# Patient Record
Sex: Female | Born: 1950 | Race: White | Hispanic: No | Marital: Married | Smoking: Never smoker
Health system: Southern US, Community
[De-identification: ages and names within clinical notes are randomized; demographics above are authoritative.]

## PROBLEM LIST (undated history)

## (undated) DIAGNOSIS — K219 Gastro-esophageal reflux disease without esophagitis: Secondary | ICD-10-CM

## (undated) DIAGNOSIS — R011 Cardiac murmur, unspecified: Secondary | ICD-10-CM

## (undated) DIAGNOSIS — M810 Age-related osteoporosis without current pathological fracture: Secondary | ICD-10-CM

## (undated) DIAGNOSIS — I10 Essential (primary) hypertension: Secondary | ICD-10-CM

## (undated) DIAGNOSIS — G4730 Sleep apnea, unspecified: Secondary | ICD-10-CM

## (undated) DIAGNOSIS — A64 Unspecified sexually transmitted disease: Secondary | ICD-10-CM

## (undated) DIAGNOSIS — M8580 Other specified disorders of bone density and structure, unspecified site: Secondary | ICD-10-CM

## (undated) DIAGNOSIS — K449 Diaphragmatic hernia without obstruction or gangrene: Secondary | ICD-10-CM

## (undated) DIAGNOSIS — I499 Cardiac arrhythmia, unspecified: Secondary | ICD-10-CM

## (undated) DIAGNOSIS — F329 Major depressive disorder, single episode, unspecified: Secondary | ICD-10-CM

## (undated) DIAGNOSIS — F32A Depression, unspecified: Secondary | ICD-10-CM

## (undated) DIAGNOSIS — M1990 Unspecified osteoarthritis, unspecified site: Secondary | ICD-10-CM

## (undated) DIAGNOSIS — E7800 Pure hypercholesterolemia, unspecified: Secondary | ICD-10-CM

## (undated) DIAGNOSIS — D649 Anemia, unspecified: Secondary | ICD-10-CM

## (undated) DIAGNOSIS — H00019 Hordeolum externum unspecified eye, unspecified eyelid: Secondary | ICD-10-CM

## (undated) HISTORY — PX: REDUCTION MAMMAPLASTY: SUR839

## (undated) HISTORY — DX: Unspecified sexually transmitted disease: A64

## (undated) HISTORY — DX: Major depressive disorder, single episode, unspecified: F32.9

## (undated) HISTORY — DX: Other specified disorders of bone density and structure, unspecified site: M85.80

## (undated) HISTORY — PX: FRACTURE SURGERY: SHX138

## (undated) HISTORY — DX: Depression, unspecified: F32.A

## (undated) HISTORY — PX: OTHER SURGICAL HISTORY: SHX169

## (undated) HISTORY — DX: Diaphragmatic hernia without obstruction or gangrene: K44.9

## (undated) HISTORY — DX: Age-related osteoporosis without current pathological fracture: M81.0

## (undated) HISTORY — DX: Hordeolum externum unspecified eye, unspecified eyelid: H00.019

## (undated) HISTORY — PX: BREAST REDUCTION SURGERY: SHX8

## (undated) HISTORY — PX: NASAL SEPTUM SURGERY: SHX37

---

## 1998-02-06 ENCOUNTER — Emergency Department (HOSPITAL_COMMUNITY): Admission: EM | Admit: 1998-02-06 | Discharge: 1998-02-06 | Payer: Self-pay | Admitting: Emergency Medicine

## 1998-08-24 ENCOUNTER — Ambulatory Visit (HOSPITAL_COMMUNITY): Admission: RE | Admit: 1998-08-24 | Discharge: 1998-08-24 | Payer: Self-pay | Admitting: Family Medicine

## 1998-10-06 ENCOUNTER — Ambulatory Visit (HOSPITAL_COMMUNITY): Admission: RE | Admit: 1998-10-06 | Discharge: 1998-10-06 | Payer: Self-pay | Admitting: Family Medicine

## 1998-11-18 ENCOUNTER — Other Ambulatory Visit: Admission: RE | Admit: 1998-11-18 | Discharge: 1998-11-18 | Payer: Self-pay | Admitting: Obstetrics and Gynecology

## 1999-11-18 ENCOUNTER — Other Ambulatory Visit: Admission: RE | Admit: 1999-11-18 | Discharge: 1999-11-18 | Payer: Self-pay | Admitting: Obstetrics and Gynecology

## 2001-02-04 ENCOUNTER — Other Ambulatory Visit: Admission: RE | Admit: 2001-02-04 | Discharge: 2001-02-04 | Payer: Self-pay | Admitting: Obstetrics and Gynecology

## 2001-02-05 ENCOUNTER — Encounter (INDEPENDENT_AMBULATORY_CARE_PROVIDER_SITE_OTHER): Payer: Self-pay | Admitting: Specialist

## 2001-02-05 ENCOUNTER — Other Ambulatory Visit: Admission: RE | Admit: 2001-02-05 | Discharge: 2001-02-05 | Payer: Self-pay | Admitting: Obstetrics and Gynecology

## 2001-03-04 ENCOUNTER — Ambulatory Visit (HOSPITAL_BASED_OUTPATIENT_CLINIC_OR_DEPARTMENT_OTHER): Admission: RE | Admit: 2001-03-04 | Discharge: 2001-03-04 | Payer: Self-pay | Admitting: Internal Medicine

## 2002-02-20 ENCOUNTER — Other Ambulatory Visit: Admission: RE | Admit: 2002-02-20 | Discharge: 2002-02-20 | Payer: Self-pay | Admitting: Obstetrics and Gynecology

## 2003-02-23 ENCOUNTER — Other Ambulatory Visit: Admission: RE | Admit: 2003-02-23 | Discharge: 2003-02-23 | Payer: Self-pay | Admitting: Obstetrics and Gynecology

## 2004-04-19 ENCOUNTER — Other Ambulatory Visit: Admission: RE | Admit: 2004-04-19 | Discharge: 2004-04-19 | Payer: Self-pay | Admitting: Obstetrics and Gynecology

## 2004-06-10 ENCOUNTER — Ambulatory Visit (HOSPITAL_COMMUNITY): Admission: RE | Admit: 2004-06-10 | Discharge: 2004-06-10 | Payer: Self-pay | Admitting: Family Medicine

## 2004-06-15 ENCOUNTER — Encounter: Admission: RE | Admit: 2004-06-15 | Discharge: 2004-06-15 | Payer: Self-pay | Admitting: Family Medicine

## 2004-09-29 ENCOUNTER — Ambulatory Visit: Payer: Self-pay | Admitting: Internal Medicine

## 2004-10-06 ENCOUNTER — Ambulatory Visit: Payer: Self-pay | Admitting: Internal Medicine

## 2004-10-14 ENCOUNTER — Ambulatory Visit (HOSPITAL_COMMUNITY): Admission: RE | Admit: 2004-10-14 | Discharge: 2004-10-14 | Payer: Self-pay | Admitting: Gastroenterology

## 2004-10-14 ENCOUNTER — Encounter (INDEPENDENT_AMBULATORY_CARE_PROVIDER_SITE_OTHER): Payer: Self-pay | Admitting: *Deleted

## 2005-04-20 ENCOUNTER — Other Ambulatory Visit: Admission: RE | Admit: 2005-04-20 | Discharge: 2005-04-20 | Payer: Self-pay | Admitting: *Deleted

## 2005-05-16 ENCOUNTER — Ambulatory Visit: Payer: Self-pay | Admitting: Internal Medicine

## 2005-07-18 ENCOUNTER — Ambulatory Visit: Payer: Self-pay | Admitting: Internal Medicine

## 2005-08-17 ENCOUNTER — Ambulatory Visit: Payer: Self-pay | Admitting: Internal Medicine

## 2005-11-03 ENCOUNTER — Encounter: Admission: RE | Admit: 2005-11-03 | Discharge: 2005-11-03 | Payer: Self-pay | Admitting: Orthopaedic Surgery

## 2005-12-27 ENCOUNTER — Encounter: Admission: RE | Admit: 2005-12-27 | Discharge: 2005-12-27 | Payer: Self-pay | Admitting: Orthopedic Surgery

## 2006-02-07 ENCOUNTER — Inpatient Hospital Stay (HOSPITAL_COMMUNITY): Admission: RE | Admit: 2006-02-07 | Discharge: 2006-02-11 | Payer: Self-pay | Admitting: Orthopedic Surgery

## 2006-05-01 ENCOUNTER — Other Ambulatory Visit: Admission: RE | Admit: 2006-05-01 | Discharge: 2006-05-01 | Payer: Self-pay | Admitting: Obstetrics & Gynecology

## 2006-05-21 ENCOUNTER — Inpatient Hospital Stay (HOSPITAL_COMMUNITY): Admission: RE | Admit: 2006-05-21 | Discharge: 2006-05-24 | Payer: Self-pay | Admitting: Orthopedic Surgery

## 2006-08-31 ENCOUNTER — Ambulatory Visit: Payer: Self-pay | Admitting: Internal Medicine

## 2007-02-14 ENCOUNTER — Ambulatory Visit: Payer: Self-pay | Admitting: Internal Medicine

## 2007-05-09 ENCOUNTER — Other Ambulatory Visit: Admission: RE | Admit: 2007-05-09 | Discharge: 2007-05-09 | Payer: Self-pay | Admitting: Obstetrics & Gynecology

## 2007-06-14 DIAGNOSIS — J4540 Moderate persistent asthma, uncomplicated: Secondary | ICD-10-CM | POA: Insufficient documentation

## 2007-06-14 DIAGNOSIS — J3089 Other allergic rhinitis: Secondary | ICD-10-CM

## 2007-06-14 DIAGNOSIS — J302 Other seasonal allergic rhinitis: Secondary | ICD-10-CM | POA: Insufficient documentation

## 2007-06-14 DIAGNOSIS — G4733 Obstructive sleep apnea (adult) (pediatric): Secondary | ICD-10-CM | POA: Insufficient documentation

## 2007-06-14 DIAGNOSIS — J4530 Mild persistent asthma, uncomplicated: Secondary | ICD-10-CM | POA: Insufficient documentation

## 2007-06-14 DIAGNOSIS — K219 Gastro-esophageal reflux disease without esophagitis: Secondary | ICD-10-CM | POA: Insufficient documentation

## 2007-06-14 DIAGNOSIS — I10 Essential (primary) hypertension: Secondary | ICD-10-CM | POA: Insufficient documentation

## 2007-08-19 ENCOUNTER — Ambulatory Visit: Payer: Self-pay | Admitting: Internal Medicine

## 2008-05-13 ENCOUNTER — Other Ambulatory Visit: Admission: RE | Admit: 2008-05-13 | Discharge: 2008-05-13 | Payer: Self-pay | Admitting: Obstetrics & Gynecology

## 2008-07-17 ENCOUNTER — Ambulatory Visit: Payer: Self-pay | Admitting: Internal Medicine

## 2008-11-25 ENCOUNTER — Ambulatory Visit: Payer: Self-pay | Admitting: Internal Medicine

## 2008-11-25 DIAGNOSIS — J329 Chronic sinusitis, unspecified: Secondary | ICD-10-CM | POA: Insufficient documentation

## 2009-06-04 DIAGNOSIS — H00019 Hordeolum externum unspecified eye, unspecified eyelid: Secondary | ICD-10-CM

## 2009-06-04 HISTORY — DX: Hordeolum externum unspecified eye, unspecified eyelid: H00.019

## 2010-01-27 ENCOUNTER — Ambulatory Visit: Payer: Self-pay | Admitting: Internal Medicine

## 2010-02-28 ENCOUNTER — Telehealth (INDEPENDENT_AMBULATORY_CARE_PROVIDER_SITE_OTHER): Payer: Self-pay | Admitting: *Deleted

## 2010-03-17 ENCOUNTER — Telehealth (INDEPENDENT_AMBULATORY_CARE_PROVIDER_SITE_OTHER): Payer: Self-pay | Admitting: *Deleted

## 2010-04-09 ENCOUNTER — Emergency Department (HOSPITAL_COMMUNITY): Admission: EM | Admit: 2010-04-09 | Discharge: 2010-04-09 | Payer: Self-pay | Admitting: Emergency Medicine

## 2010-04-24 ENCOUNTER — Ambulatory Visit (HOSPITAL_COMMUNITY): Admission: EM | Admit: 2010-04-24 | Discharge: 2010-04-24 | Payer: Self-pay | Admitting: Emergency Medicine

## 2010-05-30 ENCOUNTER — Ambulatory Visit: Payer: Self-pay | Admitting: Internal Medicine

## 2010-09-13 ENCOUNTER — Telehealth (INDEPENDENT_AMBULATORY_CARE_PROVIDER_SITE_OTHER): Payer: Self-pay | Admitting: *Deleted

## 2010-09-14 ENCOUNTER — Telehealth (INDEPENDENT_AMBULATORY_CARE_PROVIDER_SITE_OTHER): Payer: Self-pay | Admitting: *Deleted

## 2010-09-14 ENCOUNTER — Ambulatory Visit
Admission: RE | Admit: 2010-09-14 | Discharge: 2010-09-14 | Payer: Self-pay | Source: Home / Self Care | Attending: Internal Medicine | Admitting: Internal Medicine

## 2010-10-04 NOTE — Assessment & Plan Note (Signed)
Summary: rov/apc   Primary Provider/Referring Provider:  Selena Batten  CC:  Follow up visit-asthma.  History of Present Illness: OBSTRUCTIVE SLEEP APNEA (ICD-327.23) HYPERTENSION (ICD-401.9) GERD (ICD-530.81) ASTHMA (ICD-493.90)  From 08/19/07-     This is a 60 year old woman who presents for follow-up Pulmonary visit.  The patient comes in today for follow-up of asthma, allergic rhinitis. Has been stressed with mother's illness.  Sinusitis in October- took Augmentin 7 days-> yeast infection. Then took 10 more days with recurrence Febrile then for a while..Then z-pak. Two days off antibiotic sinusitis flares again. On Augmentin again now. Pressure discomfort left ear .Prior deviated septum repair by Dr. Haroldine Laws 1985. Chest ok.No longer febrile, hormone sweats, pressure but not real headache. Got flu vax.  07/17/08- Asthma, allergic rhinits 1 yr f/u. Discussed Flu season and vaccines. Well controlled. Rare need rescue inhaler, mostly before exercise. Neti pot helps rhinitis.Denies wheeze, sleep distrubance by breathing, chest pain or palpitation.  11/25/08- asthma, allergic rhinitis, sinusitis She feels she has a sinus infection with 3-4 days of head congestion, Mucus is discolored. She started herself on a sulfa drug script left over- now day 3. Augmentin works better. No distinct headache, fever, ear ache, sore throat or cough yet.  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 She took an antibioitic but didn't think it lasted long enough. Sinus congestion- using Neti pot. Keeps some chest tightness much of the time, but with current illness is using her rescue inhaler more and not seeming to help. Taking magnesium for her breathing- discussed.    Asthma History    Asthma Control Assessment:    Age range: 12+ years    Symptoms: >2 days/week    Nighttime Awakenings:  0-2/month    Interferes w/ normal activity: no limitations    SABA use (not for EIB): >2 days/week    Asthma Control Assessment: Not Well Controlled   Preventive Screening-Counseling & Management  Alcohol-Tobacco     Smoking Status: never  Current Medications (verified): 1)  Advair Diskus 100-50 Mcg/dose  Misc (Fluticasone-Salmeterol) .... Inhale 1 Puff Two Times A Day 2)  Singulair 10 Mg  Tabs (Montelukast Sodium) .... Take 1 Tablet By Mouth Once A Day 3)  Nasonex 50 Mcg/act  Susp (Mometasone Furoate) .... Take 2 Sprays in Each Nostril Once A Day 4)  Prometrium 200 Mg  Caps (Progesterone Micronized) 5)  Zyrtec Allergy 10 Mg  Tabs (Cetirizine Hcl) .... Take 1 Tablet By Mouth Once A Day 6)  Proair Hfa 108 (90 Base) Mcg/act  Aers (Albuterol Sulfate) .... 2 Puffs 4 Times Daily If Needed 7)  Lunesta 1 Mg  Tabs (Eszopiclone) .... Take As Needed 8)  Vivelle-Dot 0.0375 Mg/24hr  Pttw (Estradiol) 9)  Norvasc 5 Mg  Tabs (Amlodipine Besylate) 10)  Lipitor 80 Mg Tabs (Atorvastatin Calcium) .Marland Kitchen.. 1 Tablet Once Daily 11)  Nexium 40 Mg  Cpdr (Esomeprazole Magnesium) .... Take 1 By Mouth Once Daily 12)  Calcium 500 500 Mg  Tabs (Calcium Carbonate) .... Take 1 By Mouth Once Daily 13)  Vitamin D 400 Unit  Caps (Cholecalciferol) .... Take Once Daily As Directed 14)  Augmentin 875-125 Mg Tabs (Amoxicillin-Pot Clavulanate) .Marland Kitchen.. 1 Twice Daily  Allergies (verified): No Known Drug Allergies  Past History:  Past Medical History: Last updated: 08/19/2007 Asthma GERD Hypertension Allergic rhinitis Remote obstructive sleep apnea- no longer uses mouth piece Bilateral Hip replacements Anemia, iron deficiency  Family History: Last updated: 09/08/2008 Father Heart Disease Mother Arthritis Grandmother Pancreatic Cancer  Mother- deceased age 37; heart and kidney failure Father- deceased age 52; cancer and heart failure Sibling 1- living age 26 Sibling 2- living age 60; Arthritis, allergues, asthma,  reflux  Social History: Last updated: 09/08/2008 Married with 1 child Retired Runner, 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  Positive history of passive tobacco smoke exposure.  Exercises- 3-6 times weekly Caffeine- 1 cup daily   Risk Factors: Smoking Status: never (01/27/2010) Passive Smoke Exposure: yes (09/08/2008)  Past Surgical History: Bilateral hip replacement Nasal septoplasty C-section Toe surgery Knee arthroscopy  Review of Systems      See HPI       The patient complains of shortness of breath with activity, non-productive cough, and nasal congestion/difficulty breathing through nose.  The patient denies shortness of breath at rest, productive cough, coughing up blood, chest pain, irregular heartbeats, acid heartburn, indigestion, loss of appetite, weight change, abdominal pain, difficulty swallowing, sore throat, tooth/dental problems, headaches, and sneezing.    Vital Signs:  Patient profile:   60 year old female Height:      65 inches Weight:      189 pounds BMI:     31.56 O2 Sat:      91 % on Room air Pulse rate:   71 / minute BP sitting:   110 / 66  (left arm) Cuff size:   regular  Vitals Entered By: Reynaldo Minium CMA (Jan 27, 2010 2:40 PM)  O2 Flow:  Room air  Physical Exam  Additional Exam:  General: A/Ox3; pleasant and cooperative, NAD, SKIN: no rash, lesions NODES: no lymphadenopathy HEENT: [place]/AT, EOM- WNL, Conjuctivae- clear, PERRLA, TM-WNL, Nose-stuffy, red., Throat- red without exudate. 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 Caught thid from husband. i don't suspect immune  incompetance. Has had aumentin 3 x in past year. She would like a script to hold, but we agreed to give something different for rotation now.  Problem # 2:  OBSTRUCTIVE SLEEP APNEA (ICD-327.23) Assessment: Unchanged Noted with goal of weight loss.  Problem # 3:  ASTHMA (ICD-493.90) asthmatic bronchitis. Asthma control is uncertain and we will look at this later. Will give neb and depo today as we deal with the acute component. We will plan PFT to reassess her baseline.  Medications Added to Medication List This Visit: 1)  Clarithromycin 500 Mg Tabs (Clarithromycin) .Marland Kitchen.. 1 twice daily after meals  Other Orders: Depo- Medrol 80mg  (J1040) Admin of Therapeutic Inj  intramuscular or subcutaneous (47425) Xopenex 1.25mg  (Z5638)  Patient Instructions: 1)  Please schedule a follow-up appointment in 4 months. 2)  Schedule PFT 3)  Script to hold for augmentin 4)  script now for generic biaxin 5)  neb xop 1.25 6)  depo 80 Prescriptions: AUGMENTIN 875-125 MG TABS (AMOXICILLIN-POT CLAVULANATE) 1 twice daily  #20 x 1   Entered and Authorized by:   [name] MD   Signed by:   [name] MD on 01/27/2010   Method used:   Print then Give to Patient   RxID:   [id number] CLARITHROMYCIN 500 MG TABS (CLARITHROMYCIN) 1 twice  daily after meals  #20 x 0   Entered and Authorized by:   [name] MD   Signed by:   [name] MD on 01/27/2010   Method used:   Print then Give to Patient   RxID:   [id number]      Medication Administration  Injection # 1:    Medication: Depo- Medrol 80mg     Diagnosis: ASTHMA (ICD-493.90)    Route: IM    Site: L deltoid    Exp Date: 07/05/2012    Lot #: [id number]    Mfr: Pharmacia    Patient tolerated injection without complications    Given by: Denna Haggard, CMA (Jan 27, 2010 3:35 PM)  Medication # 1:    Medication: Xopenex 1.25mg     Diagnosis: ASTHMA (ICD-493.90)    Dose: 1    Route: po    Exp Date: 05/05/2010    Lot  #: [id number]    Mfr: [email or web address]    Patient tolerated medication without complications    Given by: Denna Haggard, CMA (Jan 27, 2010 3:36 PM)  Orders Added: 1)  Depo- Medrol 80mg  [J1040] 2)  Admin of Therapeutic Inj  intramuscular or subcutaneous [96372] 3)  Xopenex 1.25mg  [K7425]

## 2010-10-04 NOTE — Assessment & Plan Note (Signed)
Summary: rov w/pft/apc   Primary Provider/Referring Provider:  Selena Batten  CC:  Follow up visit-Review PFT.Marland Kitchen  History of Present Illness: 11/25/08- asthma, allergic rhinitis, sinusitis She feels she has a sinus infection with 3-4 days of head congestion, Mucus is discolored. She started herself on a sulfa drug script left over- now day 3. Augmentin works better. No distinct headache, fever, ear ache, sore throat or cough yet.  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 She took an antibioitic but didn't think it lasted long enough. Sinus congestion- using Neti pot. Keeps some chest tightness much of the time, but with current illness is using her rescue inhaler more and not seeming to help. Taking magnesium for her breathing- discussed.  May 30, 2010- Asthma, allergic rhinitis, sinusitis  She just finished augmentin for a bronchitis and is just improving now. Did have fever and discolored sputum, now clearing. Did use Neti pot. Needs med refills. Had been bradycardic while on nadalol given for PVCs, and no longer feeling the chest tightness- in retrospect related to the B-blocker. PFT- mild obstruction, insignif response to dilator. FEV1 2.56/ 91%, R 0.73, 25-75% 0.65.   Asthma History    Initial Asthma Severity Rating:    Age range: 12+ years    Symptoms: 0-2 days/week    Nighttime Awakenings: 0-2/month    Interferes w/ normal activity: no limitations    SABA use (not for EIB): 0-2 days/week    Asthma Severity Assessment: Intermittent   Preventive Screening-Counseling & Management  Alcohol-Tobacco     Smoking Status: never     Passive Smoke Exposure: yes  Current Medications (verified): 1)  Advair Diskus 100-50 Mcg/dose  Misc (Fluticasone-Salmeterol) .... Inhale 1 Puff Two Times A Day 2)  Singulair 10 Mg  Tabs (Montelukast Sodium) .... Take 1 Tablet By  Mouth Once A Day 3)  Nasonex 50 Mcg/act  Susp (Mometasone Furoate) .... Take 2 Sprays in Each Nostril Once A Day 4)  Prometrium 200 Mg  Caps (Progesterone Micronized) 5)  Zyrtec Allergy 10 Mg  Tabs (Cetirizine Hcl) .... Take 1 Tablet By Mouth Once A Day 6)  Proair Hfa 108 (90 Base) Mcg/act  Aers (Albuterol Sulfate) .... 2 Puffs 4 Times Daily If Needed 7)  Lunesta 1 Mg  Tabs (Eszopiclone) .... Take As Needed 8)  Vivelle-Dot 0.0375 Mg/24hr  Pttw (Estradiol) 9)  Norvasc 5 Mg  Tabs (Amlodipine Besylate) 10)  Lipitor 80 Mg Tabs (Atorvastatin Calcium) .Marland Kitchen.. 1 Tablet Once Daily 11)  Nexium 40 Mg  Cpdr (Esomeprazole Magnesium) .... Take 1 By Mouth Once Daily 12)  Calcium 500 500 Mg  Tabs (Calcium Carbonate) .... Take 1 By Mouth Once Daily 13)  Vitamin D 1000 Unit Tabs (Cholecalciferol) .... Take 1 By Mouth  Every Morning and 2 By Mouth Every Evening 14)  Fenofibrate Micronized 200 Mg Caps (Fenofibrate Micronized) .... Take 1 By Mouth At Bedtime 15)  Multivitamins  Tabs (Multiple Vitamin) .... Take 1 By Mouth Once Daily 16)  Trazodone Hcl 50 Mg Tabs (Trazodone Hcl) .... Take 1 By Mouth At Bedtime 17)  Boniva 150 Mg Tabs (Ibandronate Sodium) .... 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  Positive history of passive tobacco smoke exposure.  Exercises- 3-6 times weekly Caffeine- 1 cup daily   Risk Factors: Smoking Status:  never (05/30/2010) Passive Smoke Exposure: yes (05/30/2010)  Past Medical History: Asthma- 06/01/10 PFT 2.56/ 91%, FEV1/FVC 0.73; insignif resp to dilator GERD Hypertension Allergic rhinitis Remote obstructive sleep apnea- no longer uses mouth piece Bilateral Hip replacements Anemia, iron deficiency  Review of Systems      See HPI       The patient complains of non-productive cough.  The patient denies shortness of breath with activity, shortness of breath at rest, productive cough, coughing up blood, chest pain, irregular heartbeats, acid heartburn, indigestion, loss of appetite, weight change, abdominal pain, difficulty swallowing, sore throat, tooth/dental problems, headaches, nasal congestion/difficulty breathing through nose, and sneezing.    Vital Signs:  Patient profile:   60 year old female Height:      65 inches Weight:      182 pounds BMI:     30.40 O2 Sat:      95 % on Room air Pulse rate:   88 / minute BP sitting:   126 / 88  (right arm) Cuff size:   regular  Vitals Entered By: Reynaldo Minium CMA (May 30, 2010 2:26 PM)  O2 Flow:  Room air CC: Follow up visit-Review PFT.   Physical Exam  Additional Exam:  General: A/Ox3; pleasant and cooperative, NAD, SKIN: no rash, lesions NODES: no lymphadenopathy HEENT: [place]/AT, EOM- WNL, Conjuctivae- clear, PERRLA, TM-WNL, Nose-stuffy, red., Throat- red without exudate. Mallampati  II NECK: Supple w/ fair ROM, JVD- none, normal carotid impulses w/o bruits  Abdomen- mild overweight CHEST: Transient rattle R scapula, otw clear cough on command HEART: RRR, no m/g/r heard Extremities- no C,C or edema      Impression & Recommendations:  Problem # 1:  ASTHMA (ICD-493.90) PFT actually quite good with small airway obstruction and no response to bronchodilator. This as good as she is likely able to get.  She still feels a little congested in chest after recent antibiotic but this is probably very mild resiual  tracheobronchitis without measurable impact on airflow.  Problem # 2:  ALLERGIC RHINITIS (ICD-477.9) upper airway is currently clear. Her updated medication list for this problem includes:    Nasonex 50 Mcg/act Susp (Mometasone furoate) .Marland Kitchen... Take 2 sprays in each nostril once a day    Zyrtec Allergy 10 Mg Tabs (Cetirizine hcl) .Marland Kitchen... Take 1 tablet by mouth once a day  Problem # 3:  GERD (ICD-530.81)  Her updated medication list for this problem includes:    Nexium 40 Mg Cpdr (Esomeprazole magnesium) .Marland Kitchen... Take 1 by mouth once daily  Medications Added to Medication List This Visit: 1)  Vitamin D 1000 Unit Tabs (Cholecalciferol) .... Take 1 by mouth  every morning and 2 by mouth every evening 2)  Fenofibrate Micronized 200 Mg Caps (Fenofibrate micronized) .... Take 1 by mouth at bedtime 3)  Multivitamins Tabs (Multiple vitamin) .... Take 1 by mouth once daily 4)  Trazodone Hcl 50 Mg Tabs (Trazodone hcl) .... Take 1 by mouth at bedtime 5)  Boniva 150 Mg Tabs (Ibandronate sodium) .... Take monthly 6)  Augmentin 875-125 Mg Tabs (Amoxicillin-pot clavulanate) .Marland Kitchen.. 1 two times a day  Other Orders: Est. Patient  Level IV (04540) Future Orders: Admin 1st Vaccine (98119) ... 05/31/2010 Flu Vaccine 6yrs + (14782) ... 05/31/2010  Patient Instructions: 1)  Please schedule a follow-up appointment in 6 months. 2)  Meds refilled 3)  Flu vax Prescriptions: AUGMENTIN 875-125 MG TABS (AMOXICILLIN-POT CLAVULANATE) 1 two times a day  #20 x 0   Entered and Authorized by:   [name] MD   Signed by:   [name] MD on 05/30/2010   Method used:   Print then Give to Patient   RxID:   [id number] PROAIR HFA 108 (90 BASE) MCG/ACT  AERS (ALBUTEROL SULFATE) 2 puffs 4 times daily if needed  #8.5 Inhaler x prn   Entered and Authorized by:   [name] MD   Signed by:   [name] MD on 05/30/2010   Method used:   Electronically to        [id number]       [address]       [place], [place]  [address]       Ph: [phone] or [phone]       Fax: [phone]   RxID:   [id number] NASONEX 50 MCG/ACT  SUSP (MOMETASONE FUROATE) Take 2 sprays in each nostril once a day  #17 [place] x prn   Entered and Authorized by:   [name] MD   Signed by:   [name] MD on 05/30/2010   Method used:   Electronically to        [id number] (retail)       [address]       [place], [place]  [address]       Ph: [phone] or [phone]       Fax: [phone]   RxID:   [id number] SINGULAIR 10 MG  TABS (MONTELUKAST SODIUM) Take 1 tablet by mouth once a day  #30 Tablet x prn   Entered and Authorized by:   [name] MD   Signed by:   [name] MD on 05/30/2010   Method used:   Electronically to        [id number] ([facility]       [address]       [place], [place]  [address]       Ph: [phone] or [phone]       Fax: [phone]   RxID:   [id number] ADVAIR DISKUS 100-50 MCG/DOSE  MISC (FLUTICASONE-SALMETEROL) Inhale 1 puff two times a day  #60 Each x prn   Entered and Authorized by:   [name] MD   Signed by:   [name] MD on 05/30/2010   Method used:   Electronically to        [id number] ([facility]       [address]       [place], [place]  [address]       Ph: [phone] or [phone]       Fax: [phone]   RxID:   [id number]  Flu Vaccine Consent Questions     Do you have a history of severe allergic reactions to this vaccine? no    Any prior history of allergic reactions to egg and/or gelatin? no    Do you have a sensitivity to the preservative Thimersol? no    Do you have a past history of Guillan-Barre Syndrome? no    Do you currently have an acute febrile illness?  no    Have you ever had a severe reaction to latex? no    Vaccine information given and explained to patient? yes    Are you currently pregnant? no    Lot Number:[id number]   Exp Date:03/04/2011   Site  Given  Left Deltoid [id number]   RxID:   [id number]      .lbflu Vivianne Spence

## 2010-10-04 NOTE — Miscellaneous (Signed)
Summary: Orders Update pft charges  Clinical Lists Changes  Orders: Added new Service order of Carbon Monoxide diffusing w/capacity (94720) - Signed Added new Service order of Lung Volumes (94240) - Signed Added new Service order of Spirometry (Pre & Post) (94060) - Signed 

## 2010-10-04 NOTE — Progress Notes (Signed)
Summary: refills?  Phone Note Call from Patient Call back at Home Phone [phone]   Caller: Patient Call For: young Reason for Call: Talk to Nurse Summary of Call: pt wants to know why you didn't call her meds in for one year when refills were requested from pharmacy. Initial call taken by: Eugene Gavia,  March 17, 2010 2:36 PM  Follow-up for Phone Call        Pt aware that refills will be given after she is seen for PFT and follow up visit by CD[facility] in September. Pt verbalized she understood and will come in for testing and ROV.Reynaldo Minium CMA  March 17, 2010 2:54 PM

## 2010-10-04 NOTE — Letter (Signed)
Summary: Out of [facility]  [address]   [place], [place] [address]   Phone: [phone]  Fax: [phone]    Jan 27, 2010   Student:  [name]    To Whom It May Concern:   For Medical reasons, please excuse the above named student from school for the following dates:  Start:   Jan 27, 2010  End:    Jan 28, 2010  If you need additional information, please feel free to contact our office.   Sincerely,      Jason Coop    ****This is a legal document and cannot be tampered with.  Schools are authorized to verify all information and to do so accordingly.

## 2010-10-04 NOTE — Progress Notes (Signed)
Summary: refill  Phone Note From Pharmacy Call back at [phone]   [facility] Call For: young  Summary of Call: Need refill on proair [id number]. Initial call taken by: Darletta Moll,  February 28, 2010 10:20 AM  Follow-up for Phone Call        [facility], spoke with Paula-gave VO for proair #1 x 0. Gunnar Fusi verbalized understanding.  Gweneth Dimitri RN  February 28, 2010 10:29 AM     Prescriptions: PROAIR HFA 108 (90 BASE) MCG/ACT  AERS (ALBUTEROL SULFATE) 2 puffs 4 times daily if needed  #1 x 0   Entered by:   Gweneth Dimitri RN   Authorized by:   [name] MD   Signed by:   Gweneth Dimitri RN on 02/28/2010   Method used:   [facility] ...       [phone]       [address] [place], [place]  [address]       Ph: [phone]       Fax: [phone]   RxID:   [id number]

## 2010-10-06 NOTE — Assessment & Plan Note (Signed)
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 She took an antibioitic but didn't think it lasted long enough. Sinus congestion- using Neti pot. Keeps some chest tightness much of the time, but with current illness is using her rescue inhaler more and not seeming to help. Taking magnesium for her breathing- discussed.  May 30, 2010- Asthma, allergic rhinitis, sinusitis  She just finished augmentin for a bronchitis and is just improving now. Did have fever and discolored sputum, now clearing. Did use Neti pot. Needs med refills. Had been bradycardic while on nadalol given for PVCs, and no longer feeling the chest tightness- in retrospect related to the B-blocker. PFT- mild obstruction, insignif response to dilator. FEV1 2.56/ 91%, R 0.73, 25-75% 0.65.  September 14, 2010-Asthma, allergic rhinitis, sinusitis  Nurse-CC: Acute visit-SOB x 2 weeks, chest tightness,wheezing yesterday, hoarseness. We gave augmentin in early December and she thought she got well. . She has slowly gotten worse over past 2-3 weeks with stuffy head, hoarse, chest congestion, cough productive of thick white, no fever. Not much wheezing. She asks script Ventolin instead of Proair.      Asthma History    Asthma Control Assessment:    Age range: 12+ years    Symptoms: 0-2 days/week    Nighttime Awakenings: 0-2/month    Interferes w/ normal activity: no limitations    SABA use (not for EIB): 0-2 days/week    Asthma Control Assessment: Well Controlled   Preventive Screening-Counseling & Management  Alcohol-Tobacco     Smoking Status: never     Passive Smoke  Exposure: yes  Current Medications (verified): 1)  Advair Diskus 100-50 Mcg/dose  Misc (Fluticasone-Salmeterol) .... Inhale 1 Puff Two Times A Day 2)  Singulair 10 Mg  Tabs (Montelukast Sodium) .... Take 1 Tablet By Mouth Once A Day 3)  Nasonex 50 Mcg/act  Susp (Mometasone Furoate) .... Take 2 Sprays in Each Nostril Once A Day 4)  Prometrium 200 Mg  Caps (Progesterone Micronized) 5)  Zyrtec Allergy 10 Mg  Tabs (Cetirizine Hcl) .... Take 1 Tablet By Mouth Once A Day 6)  Proair Hfa 108 (90 Base) Mcg/act  Aers (Albuterol Sulfate) .... 2 Puffs 4 Times Daily If Needed 7)  Lunesta 1 Mg  Tabs (Eszopiclone) .... Take As Needed 8)  Vivelle-Dot 0.0375 Mg/24hr  Pttw (Estradiol) 9)  Norvasc 5 Mg  Tabs (Amlodipine Besylate) 10)  Lipitor 80 Mg Tabs (Atorvastatin Calcium) .Marland Kitchen.. 1 Tablet Once Daily 11)  Nexium 40 Mg  Cpdr (Esomeprazole Magnesium) .... Take 1 By Mouth Once Daily 12)  Calcium 500 500 Mg  Tabs (Calcium Carbonate) .... Take 1 By Mouth Once Daily 13)  Vitamin D 1000 Unit Tabs (Cholecalciferol) .... Take 1 By Mouth  Every Morning and 2 By Mouth Every Evening 14)  Fenofibrate Micronized 200 Mg Caps (Fenofibrate Micronized) .... Take 1 By Mouth At Bedtime 15)  Multivitamins  Tabs (Multiple Vitamin) .... Take 1 By Mouth Once Daily 16)  Trazodone Hcl 50 Mg Tabs (Trazodone Hcl) .... Take 1 By Mouth At Bedtime 17)  Boniva 150 Mg Tabs (Ibandronate Sodium) .... Take Monthly  Allergies (verified): No Known Drug Allergies  Past History:  Past Medical History: Last updated: 05/30/2010  Asthma- 06/01/10 PFT 2.56/ 91%, FEV1/FVC 0.73; insignif resp to dilator GERD Hypertension Allergic rhinitis Remote obstructive sleep apnea- no longer uses mouth piece Bilateral Hip replacements Anemia, iron deficiency  Past Surgical History: Last updated: 01/27/2010 Bilateral hip replacement Nasal septoplasty C-section Toe surgery Knee arthroscopy  Family History: Last updated: 09/08/2008 Father Heart  Disease Mother Arthritis Grandmother Pancreatic Cancer  Mother- deceased age 77; heart and kidney failure Father- deceased age 37; cancer and heart failure Sibling 1- living age 44 Sibling 2- living age 57; Arthritis, allergues, asthma, reflux  Social History: Last updated: 09/08/2008 Married with 1 child Retired Runner, 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  Positive history of passive tobacco smoke exposure.  Exercises- 3-6 times weekly Caffeine- 1 cup daily   Risk Factors: Smoking Status: never (09/14/2010) Passive Smoke Exposure: yes (09/14/2010)  Review of Systems      See HPI       The patient complains of shortness of breath with activity, productive cough, chest pain, and nasal congestion/difficulty breathing through nose.  The patient denies shortness of breath at rest, non-productive cough, coughing up blood, irregular heartbeats, acid heartburn, indigestion, loss of appetite, weight change, abdominal pain, difficulty swallowing, sore throat, tooth/dental problems, headaches, and sneezing.    Vital Signs:  Patient profile:   60 year old female Height:      65 inches Weight:      189 pounds BMI:     31.56 O2 Sat:      96 % on Room air Pulse rate:   81 / minute BP sitting:   124 / 82  (left arm) Cuff size:   regular  Vitals Entered By: Reynaldo Minium CMA (September 14, 2010 8:55 AM)  O2 Flow:  Room air CC: Acute visit-SOB x 2 weeks, chest tightness,wheezing yesterday,hoarseness.   Physical Exam  Additional Exam:  General: A/Ox3; pleasant and cooperative, NAD, SKIN: no rash, lesions NODES: no lymphadenopathy HEENT: [place]/AT, EOM- WNL, Conjuctivae- clear, PERRLA, TM-WNL, Nose-stuffy, red., Throat- clear. Mallampati  II, hoarse NECK: Supple w/ fair ROM, JVD- none, normal carotid impulses w/o bruits  Abdomen- mild overweight CHEST: Cough sounds wheezey, slow expiratory wheeze  HEART: RRR, no m/g/r heard Extremities- no C,C or edema      Impression &  Recommendations:  Problem # 1:  ASTHMA (ICD-493.90) Asthmatic bronchnitis. She agreed with trial of neb, depo and antibiotic. We will need to see that this clears, but she is appropriate with her ususal meds. I will chage Proair to Ventolin at her request.   Problem # 2:  RHINOSINUSITIS, RECURRENT (ICD-473.9) I can't rule out a low grade sinusitis now and will cover with planned treatment.   Medications Added to Medication List This Visit: 1)  Ventolin Hfa 108 (90 Base) Mcg/act Aers (Albuterol sulfate) .... 2 puffs four times a day as needed 2)  Biaxin 500 Mg Tabs (Clarithromycin) .Marland Kitchen.. 1 two times a day after meals  Other Orders: Est. Patient Level III (59563) Admin of Therapeutic Inj  intramuscular or subcutaneous (87564) Depo- Medrol 80mg  (J1040) Nebulizer Tx (33295) Albuterol Sulfate Sol 1mg  unit dose (J8841)  Patient Instructions: 1)  Please schedule a follow-up appointment in 6 months. 2)  Scripts for antibiotic and Ventolin sent to drug store 3)  Neb a  4)  depo 80 Prescriptions: BIAXIN 500 MG TABS (CLARITHROMYCIN) 1 two times a day after meals  #20 x 1   Entered and Authorized by:   [name] MD   Signed by:   [name] MD  on 09/14/2010   Method used:   Electronically to        [id number] (retail)       [address]       [place], [place]  [address]       Ph: [phone] or [phone]       Fax: [phone]   RxID:   [id number] VENTOLIN HFA 108 (90 BASE) MCG/ACT AERS (ALBUTEROL SULFATE) 2 puffs four times a day as needed  #1 x prn   Entered and Authorized by:   [name] MD   Signed by:   [name] MD on 09/14/2010   Method used:   Electronically to        [id number] (retail)       [address]       [place], [place]  [address]       Ph: [phone] or [phone]       Fax: [phone]   RxID:   [id number]      Medication Administration  Injection # 1:    Medication: Depo- Medrol 80mg     Diagnosis: ASTHMA  (ICD-493.90)    Route: SQ    Site: LUOQ gluteus    Exp Date: 03/2013    Lot #: [id number]    Mfr: Pharmacia    Patient tolerated injection without complications    Given by: Reynaldo Minium CMA (September 14, 2010 1:50 PM)  Medication # 1:    Medication: Albuterol Sulfate Sol 1mg  unit dose    Diagnosis: ASTHMA (ICD-493.90)    Dose: 1 vial    Route: inhaled    Exp Date: 10/2011    Lot #: [id number]    Mfr: nephron    Patient tolerated medication without complications    Given by: Reynaldo Minium CMA (September 15, 2010 8:48 AM)  Orders Added: 1)  Est. Patient Level III [71696] 2)  Admin of Therapeutic Inj  intramuscular or subcutaneous [96372] 3)  Depo- Medrol 80mg  [J1040] 4)  Nebulizer Tx [94640] 5)  Albuterol Sulfate Sol 1mg  unit dose [V8938]

## 2010-10-06 NOTE — Progress Notes (Signed)
Summary: appt  Phone Note Call from Patient Call back at Home Phone [phone]   Caller: Patient Call For: young Summary of Call: Pt c/o asthma symptoms x2-3 days wants to be seen pls advise.//[facility] Initial call taken by: Darletta Moll,  September 13, 2010 3:46 PM  Follow-up for Phone Call        Spoke with pt.  She is c/o increased SOB x 2 wks- worse this am with chest tightness and also wheezing.  She wants to be seen this wk.  I advised no openings in Dr Roxy Cedar sched.  She states that she does not want to go to [facility] but will ahve to if we can not work her in.  Pls advise thanks Follow-up by: Vernie Murders,  September 13, 2010 4:00 PM  Additional Follow-up for Phone Call Additional follow up Details #1::        Pt will be here at 845am for acute visit.Reynaldo Minium CMA  September 13, 2010 5:05 PM

## 2010-10-06 NOTE — Progress Notes (Signed)
Summary: prescription issue  Phone Note Call from Patient Call back at Home Phone [phone]   Caller: Patient Call For: young Summary of Call: Pt states that pharmacy doesn't have the biaxin prescribed, they have extended release, she says she called [facility] [address] and they do have med(their number is [phone]), pls advise. Initial call taken by: Darletta Moll,  September 14, 2010 2:19 PM  Follow-up for Phone Call        Spoke with patient-aware to call pharmacy and have Rx transfered to [facility].Reynaldo Minium CMA  September 14, 2010 3:57 PM

## 2010-11-18 ENCOUNTER — Encounter: Payer: Self-pay | Admitting: Internal Medicine

## 2010-11-18 LAB — DIFFERENTIAL
Basophils Absolute: 0 K/uL (ref 0.0–0.1)
Basophils Relative: 1 % (ref 0–1)
Eosinophils Absolute: 0.1 K/uL (ref 0.0–0.7)
Eosinophils Relative: 2 % (ref 0–5)
Lymphocytes Relative: 19 % (ref 12–46)
Lymphs Abs: 1.2 K/uL (ref 0.7–4.0)
Monocytes Absolute: 0.4 K/uL (ref 0.1–1.0)
Monocytes Relative: 6 % (ref 3–12)
Neutro Abs: 4.5 K/uL (ref 1.7–7.7)
Neutrophils Relative %: 73 % (ref 43–77)

## 2010-11-18 LAB — POCT I-STAT, CHEM 8
BUN: 17 mg/dL (ref 6–23)
Calcium, Ion: 0.95 mmol/L — ABNORMAL LOW (ref 1.12–1.32)
Calcium, Ion: 1.17 mmol/L (ref 1.12–1.32)
Chloride: 105 meq/L (ref 96–112)
Creatinine, Ser: 1.1 mg/dL (ref 0.4–1.2)
Glucose, Bld: 114 mg/dL — ABNORMAL HIGH (ref 70–99)
Glucose, Bld: 98 mg/dL (ref 70–99)
HCT: 39 % (ref 36.0–46.0)
HCT: 40 % (ref 36.0–46.0)
Hemoglobin: 13.6 g/dL (ref 12.0–15.0)
Potassium: 3.9 meq/L (ref 3.5–5.1)
Sodium: 139 meq/L (ref 135–145)
TCO2: 25 mmol/L (ref 0–100)
TCO2: 25 mmol/L (ref 0–100)

## 2010-11-18 LAB — CBC
HCT: 38.3 % (ref 36.0–46.0)
Hemoglobin: 12.8 g/dL (ref 12.0–15.0)
MCH: 28.1 pg (ref 26.0–34.0)
MCHC: 33.5 g/dL (ref 30.0–36.0)
MCV: 83.9 fL (ref 78.0–100.0)
Platelets: 260 K/uL (ref 150–400)
RBC: 4.56 MIL/uL (ref 3.87–5.11)
RDW: 13.9 % (ref 11.5–15.5)
WBC: 6.2 K/uL (ref 4.0–10.5)

## 2010-11-18 LAB — CALCIUM: Calcium: 8.7 mg/dL (ref 8.4–10.5)

## 2010-11-28 ENCOUNTER — Ambulatory Visit (INDEPENDENT_AMBULATORY_CARE_PROVIDER_SITE_OTHER): Payer: BC Managed Care – PPO | Admitting: Internal Medicine

## 2010-11-28 ENCOUNTER — Encounter: Payer: Self-pay | Admitting: Internal Medicine

## 2010-11-28 VITALS — BP 118/80 | HR 62 | Ht 64.0 in | Wt 175.8 lb

## 2010-11-28 DIAGNOSIS — J309 Allergic rhinitis, unspecified: Secondary | ICD-10-CM

## 2010-11-28 DIAGNOSIS — G4733 Obstructive sleep apnea (adult) (pediatric): Secondary | ICD-10-CM

## 2010-11-28 DIAGNOSIS — J329 Chronic sinusitis, unspecified: Secondary | ICD-10-CM

## 2010-11-28 DIAGNOSIS — J45909 Unspecified asthma, uncomplicated: Secondary | ICD-10-CM

## 2010-11-28 MED ORDER — CLARITHROMYCIN 500 MG PO TABS: 500.0000 mg | ORAL_TABLET | Freq: Two times a day (BID) | ORAL | Status: AC

## 2010-11-28 NOTE — Assessment & Plan Note (Signed)
Good control now despite pollen, using rescue inhaler appropriately. I think it best she continue her Advair.

## 2010-11-28 NOTE — Assessment & Plan Note (Signed)
Recent acute exacerbation, now resolved. We discussed options and wtll give script for biaxin to hold.

## 2010-11-28 NOTE — Assessment & Plan Note (Signed)
Avoiding the seasonal pollen surge at least so far. We discussed decongestants and available options.

## 2010-11-28 NOTE — Progress Notes (Signed)
  Subjective:    Patient ID: [name], female    DOB: Jan 27, 1951, 60 y.o.   MRN: [id number]  HPI 60 yo F never smoker, using Advair 20, Ventolin HFA, Singulair. Last here acutely ill with URI/ bronchitis September 14, 2009. That resolved. She was on augmentin for a sinusitis, ending a week or so ago. She feels completely well now. She would like an antibiotic script to hold. Using Ventolin rescue inhaler rarely since winter bronchitis cleared. Used some before exercise.    Review of Systems Constitutional:   No weight loss, night sweats,  Fevers, chills, fatigue, lassitude. HEENT:   No headaches,  Difficulty swallowing,  Tooth/dental problems,  Sore throat,                No sneezing, itching, ear ache, nasal congestion, post nasal drip,   CV:  No chest pain,  Orthopnea, PND, swelling in lower extremities, anasarca, dizziness, palpitations  GI  No heartburn, indigestion, abdominal pain, nausea, vomiting, diarrhea, change in bowel habits, loss of appetite  Resp: No shortness of breath with exertion or at rest.  No excess mucus, no productive cough,  No non-productive cough,  No coughing up of blood.  No change in color of mucus.  No wheezing.  No chest wall deformity  Skin: no rash or lesions.  GU: no dysuria, change in color of urine, no urgency or frequency.  No flank pain.  MS:  No joint pain or swelling.  No decreased range of motion.  No back pain.  Psych:  No change in mood or affect. No depression or anxiety.  No memory loss.     Objective:   Physical Exam        Assessment & Plan:

## 2010-11-28 NOTE — Patient Instructions (Signed)
Continue present meds and call as needed. I hope it's a good summer.   Script to hold for biaxin/ clarithromycin in case of infection.

## 2010-11-28 NOTE — Assessment & Plan Note (Signed)
Remote history and no longer uses the mouth piece.

## 2011-01-17 NOTE — Assessment & Plan Note (Signed)
[name]                             PULMONARY OFFICE NOTE   [name], [name]                      MRN:          [id number]  DATE:02/14/2007                            DOB:          Sep 24, 1950    PROBLEM:  1. Asthma.  2. Esophageal reflux.  3. Hypertension.  4. Obstructive sleep apnea.   HISTORY:  She reports repeated sinus infections this spring and asked  about having an antibiotic on hand.  We discussed this strategy  carefully and discussed symptoms and findings that would support  impression of sinus infection, as opposed to simple rhinitis.  When  doing particularly poorly, she will develop some chest tightness, but  usually the asthma component has been well-controlled.  She has had no  further problems from her bilateral hip replacements.   MEDICATIONS:  1. Advair 100/50.  2. Singulair 10 mg.  3. Prometrium 200 mg.  4. Vivelle 0.0375.  5. Nasonex.  6. Norvasc 5 mg.  7. Nadolol 10 mg.  8. Mobic.  9. Ambien 10 mg.  10.Lipitor 40 mg.  11.Calcium.  12.Multivitamins.  13.Zyrtec p.r.n.  14.Fosamax.  15.Rescue albuterol inhaler.   ALLERGIES:  No medication allergy.   OBJECTIVE:  Weight 171 pounds, BP 120/60, pulse regular 48, room air  saturation 99%.  Conjunctivae are clear.  There is no periorbital edema.  Minor turbinate edema bilaterally with no significant mucus, polyps or  discoloration.  No postnasal drip or pharyngitis.  Voice quality is  normal, without stridor.  I do not find adenopathy.  Lung fields are  clear.  Heart sounds regular without murmur or gallop.   IMPRESSION:  Recurrent rhinitis or rhinosinusitis.   PLAN:  1. Augmentin 875 mg #14 one b.i.d., to hold.  2. Saline nasal lavage was discussed and technique reviewed.  She will      try this and Sudafed      at the first sign of developing nasal congestion to see if she can      head her problem off.  3. If she is stable, we will schedule return in a  year, but earlier      p.r.n.     Clinton D. Maple Hudson, MD, Tonny Bollman, FACP  Electronically Signed    CDY/MedQ  DD: 02/14/2007  DT: 02/15/2007  Job #: [id number]   cc:   Dr. Particia Jasper, [facility] [place]

## 2011-01-20 NOTE — Assessment & Plan Note (Signed)
[facility]                             PULMONARY OFFICE NOTE   DASHONDA, BONNEAU                      MRN:          [id number]  DATE:08/31/2006                            DOB:          28-Aug-1951    PROBLEM:  1. Asthma.  2. Esophageal reflux.  3. Hypertension.  4. Obstructive sleep apnea.   HISTORY:  One-year followup.  With Dr. Tawana Scale retirement, she switched  primary care to [facility].  She has had bilateral hip  replacements, which went well with no respiratory problems to complicate  anesthesia.  She sleeps on her side, and her husband does not complain  that she snores.  She is not aware of daytime sleepiness.  She had an  index of 19 per hour when her sleep study was done in 2002, and had  tried both CPAP and oral appliance, which were not continued.  She would  like to keep a p.r.n. Augmentin prescription available in case of a  bronchitic exacerbation this winter.  That worked very well for her last  winter.  We discussed the strategy.  She did get her flu shot.   MEDICATIONS:  1. Advair 100/50.  2. Singulair 10 mg.  3. Prometrium 200 mg.  4. Vivelle.  5. Norvasc 5 mg.  6. Nadolol 10 mg.  7. Mobic.  8. Ambien 10 mg p.r.n.  9. Lipitor 40 mg.  10.Calcium with multivitamins.  11.Zyrtec.  12.Fosamax.  13.Albuterol inhaler.   NO MEDICATION ALLERGY.   OBJECTIVE:  Weight 193 pounds.  BP 112/58.  Pulse regular at 50.  Room  air saturation 98%.  She looks quite cheerful.  Nasal airway is clear.  There is no stridor or neck vein distention.  LUNGS:  Clear to P&A.  HEART:  Sounds regular without murmur.   IMPRESSION:  1. Asthma, stable and controlled.  2. Allergic rhinitis.  3. Obstructive sleep apnea, likely to need reevaluation in the future.      These were all discussed.   PLAN:  1. Standby prescription of Augmentin 875 mg b.i.d. for 7 days to hold      in case of purulent bronchitis while office is closed  during winter      holidays.  2. Refilled Advair 100/50, Nasonex, albuterol and Zyrtec.  3. We discussed sleep hygiene, management of sleep apnea, and asked      her to have her husband pay more attention to her breathing at      night in case we need to repeat her sleep study.  4. Schedule return in 1 year, earlier p.r.n.     Clinton D. Maple Hudson, MD, Tonny Bollman, FACP  Electronically Signed    CDY/MedQ  DD: 09/01/2006  DT: 09/01/2006  Job #: [id number]   cc:   Bess Kinds, MD

## 2011-01-20 NOTE — H&P (Signed)
[name], [name]               ACCOUNT NO.:  [id number]   MEDICAL RECORD NO.:  [id number]           PATIENT TYPE:   LOCATION:                               FACILITY:  [facility]   PHYSICIAN:  Ollen Gross, M.D.         DATE OF BIRTH:   DATE OF ADMISSION:  DATE OF DISCHARGE:                                HISTORY & PHYSICAL   CHIEF COMPLAINT:  Left hip pain.   HISTORY OF PRESENT ILLNESS:  This 60 year old female has been seen by Dr.  Lequita Halt for ongoing hip problems.  She has previously undergone a right  total hip earlier this year and has been doing quite well.  She is at a  point now where the left hip is causing continued pain.  She is noted to  have significant arthritis.  She has done well with the right hip and is  felt to be a good candidate for the left hip.  Risks and benefits discussed.  The patient is subsequently admitted to the hospital.   ALLERGIES:  MORPHINE causes very mild itching.   CURRENT MEDICATIONS:  Nexium, Norvasc, Nadolol. Lipitor, Advair inhaler,  Singulair, albuterol inhaler, Zyrtec, Actonel, Ambien, Relafen.   INTOLERANCES:  DEMEROL and CODEINE cause nausea.   PAST MEDICAL HISTORY:  1. Left hand arthritis.  2. Asthma.  3. Osteoarthritis.  4. Reflux.  5. Hypertension.  6. Hiatal hernia.  7. Hypercholesterolemia.  8. Sleep apnea (she does not use a machine).  9. Postmenopausal.   PAST SURGICAL HISTORY:  1. Cesarean section in 1979.  2. Breast reduction in 1985.  3. Deviated septum surgery in 1985.  4. Left fourth toe procedure, 1989.  5. Left knee surgery, 1985.  6. Right total hip, 2007.   SOCIAL HISTORY:  Married, retired, but went back part time.  Nonsmoker.  One  or two drinks of alcohol per month.  Husband will be assisting with care  after surgery.   FAMILY HISTORY:  Father with history of heart disease, hypertension, and  cancer.  Mother with history of arthritis.   REVIEW OF SYSTEMS:  GENERAL:  Occasional hot flashes.  No fevers,  chills, or  night sweats.  NEUROLOGIC:  No seizures, syncope, or paralysis.  RESPIRATORY:  No shortness of breath, productive cough, or hemoptysis.  CARDIOVASCULAR:  No chest pain, angina, or orthopnea.  GASTROINTESTINAL:  No  nausea, vomiting, diarrhea, or constipation.  GENITOURINARY:  No dysuria or  hematuria or discharge.  MUSCULOSKELETAL:  Left hip.   PHYSICAL EXAMINATION:  VITAL SIGNS:  Pulse 68, respiratory rate 14, blood  pressure 112/78.  GENERAL:  A 60 year old white female, well-nourished, well-developed, no  acute distress, slightly overweight, alert, oriented, and cooperative.  Very  pleasant.  Good historian.  HEENT:  Normocephalic, atraumatic.  Pupils round and reactive.  Oropharynx  clear.  EOMs intact.  NECK:  Supple.  No carotid bruits.  CHEST:  Clear.  Anterior/posterior chest walls.  No rhonchi, rales, or  wheezing.  HEART:  Regular rhythm.  No murmur.  S1/S2 noted.  ABDOMEN:  Soft, nontender.  Bowel sounds present.  Slightly round.  RECTAL/BREASTS/GENITALIA:  Not done, not pertinent to present illness.  EXTREMITIES:  Left hip:  Left hip shows flexion of about 90 degrees, 10  degrees of abduction, 5 internal rotation, 5 external rotation.   IMPRESSION:  1. Osteoarthritis, left hip.  2. Left hand arthritis.  3. Asthma.  4. Osteoarthritis.  5. Reflux.  6. Hypertension.  7. Hiatal hernia.  8. Hypercholesterolemia.  9. Sleep apnea (no machine).  10.Postmenopausal.   PLAN:  The patient is admitted to [facility] to undergo a left  total arthroplasty.  Surgery will be performed by Dr. Ollen Gross.  Her  medical physician, Dr. Madison Hickman will be notified of the room number  and admission and will be consulted if needed for medical assistance with  the patient throughout the hospital course.      Alexzandrew L. Julien Girt, P.A.      Ollen Gross, M.D.  Electronically Signed    ALP/MEDQ  D:  05/20/2006  T:  05/21/2006  Job:  [id number]   cc:    Ollen Gross, M.D.  Fax: [phone]   Darius Bump, M.D.  Fax: [phone]

## 2011-01-20 NOTE — Discharge Summary (Signed)
[name], [name]               ACCOUNT NO.:  [id number]   MEDICAL RECORD NO.:  [id number]          PATIENT TYPE:  INP   LOCATION:  1510                         FACILITY:  [facility]   PHYSICIAN:  [name], M.D.    DATE OF BIRTH:  1950-11-29   DATE OF ADMISSION:  05/21/2006  DATE OF DISCHARGE:  05/24/2006                                 DISCHARGE SUMMARY   ADMISSION DIAGNOSES:  1. Osteoarthritis, left hip.  2. Left hand arthritis.  3. Asthma.  4. Osteoarthritis.  5. Reflux.  6. Hypertension.  7. Hiatal hernia.  8. Hypercholesterolemia.  9. Sleep apnea (no machine).  10.Postmenopausal.   DISCHARGE DIAGNOSES:  1. Osteoarthritis, left hip, status post left total hip arthroplasty.  2. Postoperative acute blood loss anemia, did not require transfusion.  3. Left hand arthritis.  4. Asthma.  5. Osteoarthritis.  6. Reflux.  7. Hypertension.  8. Hiatal hernia.  9. Hypercholesterolemia.  10.Sleep apnea (no machine).  11.Postmenopausal.   PROCEDURE:  On May 21, 2006, left total hip.  Surgeon, Dr. Lequita Halt.  Assistant, [name], P.A.  Anesthesia general.   CONSULTATIONS:  None.   BRIEF HISTORY:  The patient is a 60 year old female with end-stage  arthritis, successful right total hip, who now presents for a left total  hip.   LABORATORY DATA:  CBC on admission:  Hemoglobin is 12.0, hematocrit 36.5,  white cell count 5.0, drifting down to 10.6.  Last noted H&H was 8.4 and  25.1.  asymptomatic.  PT and PTT on admission:  13.1 and 32, respectively,  with INR of 1.0.  Serial protimes followed, last noted PT/INR was 20.9 and  1.7.  Chemistry panel on admission all within normal limits.  Serial BMETs  were followed.  Electrolytes remained within normal limits.  Urine pregnancy  done twice, both negative on September 13 and September 17.  Urinalysis  negative.  Blood type O positive.   X-RAYS:  Portable pelvis and left hip films on May 21, 2006:  Satisfactory left total hip.  EKG Jan 31, 2006:  Sinus bradycardia,  otherwise normal, performed by Dr. Olga Cochran.   HOSPITAL COURSE:  The patient was admitted to [facility] and  tolerated the procedure well.  Later transferred to the recovery room and  then to the orthopedic floor for postoperative care.  Given 24 hours of  postop IV antibiotics, started on PCA and p.o. analgesia for pain control  following surgery.  Started on Coumadin for DVT prophylaxis.  On day 1 she  had had some intermittent pain through the night but actually was doing  pretty well.  Had already sat up on the side of the bed.  Wanted to switch  over to Vicodin, thought she would tolerate that a little bit better.  The  Hemovac drain was pulled, discontinued the PCA on day #1, started getting up  with physical therapy.  By day #2 she was doing better.  Pain was under  better control.  She actually got up and ambulated approximately 100 feet  and then 150 feet later that afternoon.  Dressing  was changed.  The incision  looked good.  Hemoglobin had drifted down to 9.2.  Started on iron.  She was  asymptomatic with it.  By day #3 on May 24, 2006, she was doing very  well, incision was healing well.  Hemoglobin was 8.4 but was not complaining  of any symptoms.  Started on iron.  On morning rounds she was already  dressed and ready to go home.   DISPOSITION AND PLAN:  The patient discharged home on May 24, 2006.   DISCHARGE DIAGNOSES:  Please see above.   DISCHARGE MEDICATIONS:  Coumadin, Robaxin, Vicodin, Nu-Iron.   DIET:  Low-cholesterol diet.   ACTIVITY:  Partial white female 25-50%.  Home health PT, home health  nursing.  Hip precautions, total hip protocol.   FOLLOW-UP:  Two weeks.   DISPOSITION:  Home.   CONDITION ON DISCHARGE:  Improved.      [name], P.A.      [name], M.D.  Electronically Signed    ALP/MEDQ  D:  05/24/2006  T:  05/25/2006   Job:  [id number]   cc:   [name], M.D.  Fax: [phone]

## 2011-01-20 NOTE — Op Note (Signed)
[name], [name]               ACCOUNT NO.:  [id number]   MEDICAL RECORD NO.:  [id number]          PATIENT TYPE:  INP   LOCATION:  1510                         FACILITY:  [facility]   PHYSICIAN:  Ollen Gross, M.D.    DATE OF BIRTH:  Oct 09, 1950   DATE OF PROCEDURE:  05/21/2006  DATE OF DISCHARGE:                                 OPERATIVE REPORT   PREOPERATIVE DIAGNOSIS:  Osteoarthritis, left hip.   POSTOPERATIVE DIAGNOSIS:  Osteoarthritis, left hip.   PROCEDURE:  Left total hip arthroplasty.   SURGEON:  Ollen Gross, M.D.   ASSISTANT:  Alexzandrew L. Julien Girt, P.A.   ANESTHESIA:  General.   ESTIMATED BLOOD LOSS:  200.   DRAIN:  Hemovac x1.   COMPLICATIONS:  None.   CONDITION:  Stable to recovery.   CLINICAL NOTE:  [name] is a 60 year old female with end-stage arthritis  of the left hip.  She has had a previous successful right total hip  arthroplasty and presents now for left total hip arthroplasty.   PROCEDURE IN DETAIL:  After successful administration of general anesthetic,  the patient is placed in the right lateral decubitus position with the left  side up and held with the hip positioner.  The left lower extremity is  isolated from the perineum with plastic drapes and prepped and draped in the  usual sterile fashion.   A short posterolateral incision is made with a 10 blade through the  subcutaneous tissue to the level of the fascia lata which is incised in line  with the skin incision.  The sciatic nerve is palpated and protected and the  short rotators isolated at the femur.  Capsulectomy is performed, and the  hip is dislocated.  The center of the femoral head is marked and the trial  prosthesis placed such that the center of the trial head corresponds to the  center of the native femoral head.  Osteotomy lines are marked on the  femoral neck and osteotomy made with an oscillating saw.  The femoral head  is removed and then the femur retracted anteriorly  to gain acetabular  exposure.   The labrum and osteophytes are removed.  Reaming starts at 45 mm, coursing  in increments of 2 up to 53 mm, and then a 54-mm pinnacle acetabular shell  is placed in anatomic position and transfixed with two dome screws.  The  permanent Apex hole eliminator is placed and a permanent 36 mm neutral  Ultamet metal liner is placed.   On the femoral side, we used the canal finder and irrigation.  Axial reaming  is performed up to 15.5 mm, proximal reaming to a 20 D,  and the sleeve  machine to a small.  The 20 D small trial sleeve is placed with a 15 stem,  36 plus 12 neck, matching her native anteversion.  A 36 plus 0 head is  placed, and the hip is reduced.  It was easy reduction with the 36 plus 3  which had great soft tissue tension.  She had full extension, flexion  rotation 70 degrees, flexion 40 degrees, adduction  90 degrees, internal  rotation 90 degrees of flexion, 70 degrees internal rotation.  By placing  the left leg on top of the right, I felt as though the leg lengths were  equal.   All trials were removed, and then the permanent 20 D small sleeve is placed  with a 20 x 15 stem, 36 plus 12 neck, matching her native anteversion.  A 36  plus 3 head was placed, and the hip is reduced to the same stability  parameters.  The wound was copiously irrigated with saline solution and the  short rotators reattached to the femur through drill holes.  The fascia lata  was closed over a Hemovac drain with interrupted #1 Vicryl, the subcu closed  with 1-0 and 2-0 Vicryl and subcuticular running 4-0 Monocryl.  The drain is  hooked to suction, the incision cleaned and dried, and Steri-Strips and  bulky sterile dressing applied.  The left lower extremity is placed into a  knee immobilizer.   She is awakened and transported to recovery in stable condition.      Ollen Gross, M.D.  Electronically Signed     FA/MEDQ  D:  05/21/2006  T:  05/22/2006  Job:   [id number]

## 2011-01-20 NOTE — Op Note (Signed)
[name], [name]               ACCOUNT NO.:  [id number]   MEDICAL RECORD NO.:  [id number]          PATIENT TYPE:  AMB   LOCATION:  ENDO                         FACILITY:  [facility]   PHYSICIAN:  Anselmo Rod, M.D.  DATE OF BIRTH:  1951-01-31   DATE OF PROCEDURE:  10/14/2004  DATE OF DISCHARGE:                                 OPERATIVE REPORT   PROCEDURE PERFORMED:  Colonoscopy with multiple cold biopsies.   ENDOSCOPIST:  Anselmo Rod, M.D.   INSTRUMENT USED:  Olympus video colonoscope.   INDICATIONS FOR PROCEDURE:  A 60 year old white female undergoing a  screening colonoscopy to rule out colonic polyps, masses, etc.   PREPROCEDURE PREPARATION:  Informed consent was procured from the patient.  The patient fasted for eight hours prior to the procedure and prepped with a  bottle of magnesium citrate and a gallon of GoLYTELY the night prior to the  procedure.  The risks and benefits of the procedure including a 10% miss  rate of cancer and polyps were discussed with the patient as well.   PREPROCEDURE PHYSICAL:  VITAL SIGNS:  Stable vital signs.  NECK:  Supple.  CHEST:  Clear to auscultation.  CARDIOVASCULAR:  S1 and S2 regular.  ABDOMEN:  Soft with normal bowel sounds.   DESCRIPTION OF PROCEDURE:  The patient was placed in left lateral decubitus  position, sedated with 60 mg of Demerol and 7.5 mg of Versed in slow  incremental doses.  Once the patient was adequately sedated and maintained  on low flow oxygen and continuous cardiac monitoring, the Olympus video  colonoscope was advanced from the rectum to the cecum.  The appendiceal  orifice and ileocecal valve were clearly visualized and photographed. There  was a large amount of residual stool in the left and transverse colon.  There were multiple sigmoid diverticula seen with inspissated stool and  several other diverticula.  A small sessile polyp was biopsied from proximal  right colon and there was a prominent fold  adjacent to this polyp that was  biopsied as well.  A small sessile polyp was biopsied from the rectum.  Retroflexion in the rectum revealed no abnormalities except for some solid  stool.  The patient tolerated the procedure well without any immediate  complications.   IMPRESSION:  1.  Small sessile polyp biopsied from rectum.  2.  Another small sessile polyp in a prominent fold biopsied from proximal      right colon.  3.  Extensive sigmoid diverticulosis.  4.  Normal-appearing transverse colon.  5.  Residual stool in the colon.  Small lesions could have been missed.   RECOMMENDATIONS:  1.  Await pathology results.  2.  High fiber diet with brochures on diverticulosis have been given to the      patient for education.  3.  Outpatient follow-up in the next two weeks for further recommendations.      JNM/MEDQ  D:  10/14/2004  T:  10/14/2004  Job:  [id number]   cc:   Laqueta Linden, M.D.  [address]  [place]  [place]  [address]  Fax: [phone]

## 2011-01-20 NOTE — H&P (Signed)
[name], [name]               ACCOUNT NO.:  [id number]   MEDICAL RECORD NO.:  [id number]          PATIENT TYPE:  INP   LOCATION:  1604                         [facility]ACILITY:  [facility]   PHYSICIAN:  Ollen Gross, M.D.    DATE OF BIRTH:  1951/07/17   DATE OF ADMISSION:  02/07/2006  DATE OF DISCHARGE:                                HISTORY & PHYSICAL   CHIEF COMPLAINT:  Right hip pain.   HISTORY OF PRESENT ILLNESS:  The patient is a 60 year old female who has  been seen by Dr. Homero Fellers Aluisio for ongoing right hip pain.  She was seen as  a second opinion earlier this year for right greater than left hip and also  left knee pain.  The hip has been ongoing for quite some time now.  She has  been a patient over at [facility], seen and treated by Dr. Cleophas Dunker and she was  informed due to her arthritis she was in need of a hip replacement or a knee  replacement.  She has a sister-in-law of Dr. Theressa Millard and came in for a  second opinion.  She was see by Dr. Lequita Halt.  Her x-rays reveal the right  hip shows significant end-stage arthritis with bone-on-bone changes and also  subchondral cystic formation.  Left hip does have some degenerative changes  but to a lesser degree.  She does have significant bone-on-bone arthritis  also on the left knee.  Unfortunately, the right hip is definitely worse on  radiograph.  The patient has reached a point where she could benefit also  from hip and knee replacement in most situation and it was also discussed  with her.  It was best to proceed first with the hip replacement.  The right  hip is far more symptomatic and problematic than the left.  It was  recommended that the patient undergo right hip replacement.  She is in  agreement.  Risks and benefits have been discussed and she has elected to  proceed with surgery.   ALLERGIES:  No known drug allergies.   CURRENT MEDICATIONS:  Nexium, Norvasc, nadolol, Lipitor, Advair inhaler,  Singulair, albuterol inhaler,  Zyrtec, Actonel, Mobic, Ambien.   INTOLERANCES:  DEMEROL and CODEINE causes nausea.   PAST MEDICAL HISTORY:  1.  Asthma.  2.  Osteoarthritis.  3.  Reflux.  4.  Hypertension.  5.  Hiatal hernia.  6.  Hypercholesterolemia.  7.  Sleep apnea.  8.  Postmenopausal.   PAST SURGICAL HISTORY:  1.  Cesarean section in 1979.  2.  Breast reduction in 1985.  3.  Deviated septum in 1985.  4.  Left fourth toe procedure in 1989.  5.  Left knee surgery in 1985.   SOCIAL HISTORY:  Married.  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  Nonsmoker.  One or two drinks per month of alcohol.  One daughter.  Husband will be assisting with the care after surgery.  Father with a  history of heart disease, hypertension and cancer.  Mother with a history of  arthritis.   REVIEW OF SYSTEMS:  GENERAL:  No fevers, chills, night sweats.  NEUROLOGICAL:  No seizures, syncope, paralysis.  RESPIRATORY:  No shortness  of breath, productive cough or hemoptysis.  CARDIOVASCULAR:  No chest pain,  angina or orthopnea.  GI:  No nausea, vomiting, diarrhea or constipation.  GU:  No dysuria, hematuria or discharge.   PHYSICAL EXAMINATION:  VITAL SIGNS:  Pulse 74, respirations 12, blood  pressure 122/62.  GENERAL:  A 60 year old white female, well-developed, well-nourished in no  acute distress.  Short in stature.  Alert, oriented and cooperative.  HEENT:  Normocephalic and atraumatic.  Pupils round and reactive to light.  Oropharynx clear.  EOMs intact.  NECK:  Supple.  CHEST:  Clear anterior and posterior chest walls.  No rhonchi, rales or  wheezing.  HEART:  Regular rate and rhythm without murmur.  ABDOMEN:  Soft abdomen.  Round, nontender.  Bowel sounds present.  RECTAL:  Not done, not pertinent to present illness.  BREASTS:  Not done, not pertinent to present illness.  GENITALIA:  Not done, not pertinent to present illness.  EXTREMITIES:  Right hip shows flexion to 90 degrees, 0 internal  rotation,  about 10-15 degrees of external rotation, about 10-15 degrees of abduction.  Slightly antalgic gait.   IMPRESSION:  1.  Osteoarthritis right hip.  2.  History of asthma.  3.  Osteoarthritis.  4.  Hypertension.  5.  Hiatal hernia.  6.  Reflux disease.  7.  Sleep apnea.  8.  Hypercholesterolemia.   PLAN:  The patient is admitted to [facility].  Undergo a right  total hip arthroplasty.  Surgery will be performed by Dr. Ollen Gross.  She has been seen preoperatively by Dr. Margrett Rud and felt to be at a  moderate risks and airway management is the key.  Otherwise, she has elected  to proceed with surgery.      Alexzandrew L. Julien Girt, P.A.      Ollen Gross, M.D.  Electronically Signed    ALP/MEDQ  D:  02/07/2006  T:  02/07/2006  Job:  [id number]   cc:   Vale Haven. Andrey Campanile, M.D.  Fax: [phone]   Quita Skye. Artis Flock, M.D.  Fax: [phone]   Ollen Gross, M.D.  Fax: [phone]   Patient's chart

## 2011-01-20 NOTE — Op Note (Signed)
[name], [name]               ACCOUNT NO.:  [id number]   MEDICAL RECORD NO.:  [id number]          PATIENT TYPE:  INP   LOCATION:  0006                         [facility]ACILITY:  [facility]   PHYSICIAN:  Ollen Gross, M.D.    DATE OF BIRTH:  12-02-1950   DATE OF PROCEDURE:  02/07/2006  DATE OF DISCHARGE:                                 OPERATIVE REPORT   PREOPERATIVE DIAGNOSIS:  Osteoarthritis right hip.   POSTOPERATIVE DIAGNOSIS:  Osteoarthritis right hip.   PROCEDURE:  Right total hip arthroplasty.   SURGEON:  Ollen Gross, M.D.   ASSISTANT:  Alexzandrew L. Julien Girt, P.A.   ANESTHESIA:  General.   ESTIMATED BLOOD LOSS:  500.   DRAIN:  Hemovac x1.   COMPLICATIONS:  None.   CONDITION:  Stable to recovery.   BRIEF CLINICAL NOTE:  [name] is a 60 year old female with end-stage  osteoarthritis of the right hip with intractable pain.  She has bone-on-bone  arthritis and at this point presents for total hip arthroplasty.   PROCEDURE IN DETAIL:  After successful administration of general anesthetic,  the patient was placed in left lateral decubitus position with the right  side up and held with the hip positioner.  Right lower extremity was  isolated from her perineum with plastic drapes and prepped and draped in the  usual sterile fashion.  Short posterolateral incision was made with a 10  blade through subcutaneous tissue to the level of the fascia lata which was  incised in line with the skin incision.  Sciatic nerve was palpated and  protected, and short rotators isolated off the femur.  Capsulectomy was  performed, and the hip was dislocated.  The center of femoral head is  marked, and a trial prosthesis placed such that the center of the trial head  corresponds to the center of her native femoral head.  Osteotomy lines  marked on the femoral neck and osteotomy made with an oscillating saw.  Femoral head was removed, and then the femur retracted anteriorly to gain  acetabular exposure.   Acetabular retractors were placed and then labrum and osteophytes removed.  Acetabular reaming starts at 47, coursing increments of 2 mm up to 53 mm,  then a 54 mm Pinnacle acetabular shell was placed in anatomic position and  transfixed with two dome screws.  Trial 36 mm neutral liner was placed.   The femur was prepared with the canal finder and irrigation.  Axial reaming  was performed to 15.5 mm, proximal reaming to 20D and the sleeve machined to  a small.  A 20D small trial sleeve was placed with a 20 x 15 stem and 36 +8  neck.  We went 20 degrees beyond her native anteversion, as her native  anteversion was only neutral.  We ordered a 36 +0 head.  Is reduced quite  easily.  I did not feel that there was enough offset present.  She still had  full extension, flexion, and rotation, with about 70 degrees flexion, 40  degrees adduction.  She dislocated a 40 degrees of internal rotation.  We  then switched 36 +  12 neck, with the same anteversion, and a 36 +0 head.  This led to a much more stable reduction.  By placing the right leg on top  of the left, she still a few millimeters short, so went to 36 +3, which had  better soft tissue tension overall and had great stability with full  extension, full external rotation, 70 degrees flexion, 40 degrees adduction,  90 degrees internal rotation, and 90 degrees of flexion, and 70 degrees of  internal rotation.  By placing the right leg on top of the left leg, lengths  were now equal.  Hip was then dislocated, and all trials were removed.  The  permanent apex hole eliminator was placed into the acetabular shell.  Then,  a permanent 36 mm neutral Ultamet metal liner was placed.  On the femoral  side, we placed the permanent 20D small sleeve and 20 x 15 stem, 36 +12  neck, about 20 degrees beyond native anteversion.  36 +3 head was placed,  and the hip was reduced to the same stability parameters.  The wound was  copiously  irrigated with saline solution, and short rotators reattached the  femur through drill holes.  Fascia lata was closed over a Hemovac drain with  interrupted #1 Vicryl, subcutaneous closed with #1 and 2-0 Vicryl and  subcuticular running 4-0 Monocryl.  The drain was hooked to suction.  Incision cleaned and dried, and Steri-Strips and a bulky sterile dressing  applied.  She was then placed into a knee immobilizer, awakened, and  transported to recovery in stable condition.      Ollen Gross, M.D.  Electronically Signed     FA/MEDQ  D:  02/07/2006  T:  02/08/2006  Job:  [id number]

## 2011-01-20 NOTE — Discharge Summary (Signed)
NAMEASHTAN, LATON               ACCOUNT NO.:  [id number]   MEDICAL RECORD NO.:  [id number]          PATIENT TYPE:  INP   LOCATION:  1604                         FACILITY:  [facility]   PHYSICIAN:  Ollen Gross, M.D.    DATE OF BIRTH:  15-Mar-1951   DATE OF ADMISSION:  02/07/2006  DATE OF DISCHARGE:  02/11/2006                                 DISCHARGE SUMMARY   ADMITTING DIAGNOSES:  1. Osteoarthritis, right hip.  2. History of asthma.  3. Osteoarthritis.  4. Hypertension.  5. Hiatal hernia.  6. Reflux disease.  7. Sleep apnea.  8. Hypercholesterolemia.   DISCHARGE DIAGNOSES:  1. Osteoarthritis, right hip, status post right total hip arthroplasty.  2. Acute blood loss anemia.  3. Postoperative hyponatremia.  4. History of asthma.  5. Osteoarthritis.  6. Hypertension.  7. Hiatal hernia.  8. Reflux disease.  9. Sleep apnea.  10.Hypercholesterolemia.   PROCEDURE:  On February 07, 2006, right total hip.   SURGEON:  Dr. Lequita Halt.   ASSISTANT:  Alexzandrew L. Julien Girt, P.A.   ANESTHESIA:  General.   CONSULTS:  None.   BRIEF HISTORY:  Ms. Decatur is a 60 year old female with end-stage  osteoarthritis of her right hip with intractable pain, bone-on-bone  arthritis.  She would like to proceed with total hip arthroplasty.   LABORATORY DATA:  Preop CBC:  Hemoglobin 13.3, hematocrit 39.7.  Postop  hemoglobin 10.9, came back up to 12.3, drifted back down to 9.  Last noted  H&H 9.1 and 27.1.  PT and PTT on admission 12.6 and 32, respectively.  INR  0.9.  Serial protimes followed.  Last noted PT and INR 22.5 and 2.  Chem  panel on admission all within normal limits.  Sodium started out at 137,  dropped to 133, last noted at 130.  Preop UA negative.  Blood group type O  positive.   Portable hip and pelvis on February 07, 2006, surgical drain, status post right  total hip arthroplasty.  Two-view chest Jan 31, 2006, no acute findings.  Two-view hip film Jan 31, 2006, preop advanced right  hip arthritis.   EKG:  Jan 31, 2006, normal sinus bradycardia, otherwise normal, unconfirmed.   HOSPITAL COURSE:  The patient was admitted to [facility] and  tolerated the procedure well, later went to recovery room and orthopedic  floor.  Started on PCA and p.o. analgesics for pain control from surgery.  Doing well pretty on the morning of day one.  The PCA was discontinued.  Seen on rounds.  Started getting up out of bed with physical therapy.  Hemoglobin was 10.  By day two, she was actually feeling better.  Did get a  little bit of rest the night before.  Potassium had dropped.  Sodium had  dropped.  Therefore, the fluids were discontinued.  Potassium supplement was  added.  It was low normal.  Gave her some mild potassium supplements.  Hemoglobin had improved back up to 12.3.  Started getting up with physical  therapy.  Actually, did very well with PT, walking 120 feet twice on day  two.  Dressing was changed.  Incision looked good.  By day three, she was  doing very well.  No complaints.  Continued to progress with physical  therapy.  Had another drop in her hemoglobin down to 9.  It was felt to be  due to some blood loss in the thigh.  Continued to monitor the hyponatremia.  Dressing was changed.  Incision was healing well.  Continued to receive  daily therapy.  On the morning of February 11, 2006, her hematocrit had improved  a little bit and was stable.  Dressing was changed.  Incision looked good.  No complaints and was ready to be discharged home.   DISCHARGE PLAN:  The patient is discharged home on February 11, 2006.   DISCHARGE DIAGNOSES:  Please see above.   DISCHARGE MEDICATIONS:  1. Coumadin.  2. Vicodin.  3. Robaxin.   ACTIVITY:  Partial weightbearing 25% to 50% right lower extremity.   FOLLOWUP:  Two weeks.  Total hip protocol.  Home health PT and home health  nursing.   DISPOSITION:  Home.   CONDITION UPON DISCHARGE:  Improved.      Alexzandrew L.  Julien Girt, P.A.      Ollen Gross, M.D.  Electronically Signed    ALP/MEDQ  D:  03/20/2006  T:  03/21/2006  Job:  [id number]   cc:   Vale Haven. Andrey Campanile, M.D.  Fax: [phone]   Quita Skye. Artis Flock, M.D.  Fax: [phone]

## 2011-02-03 ENCOUNTER — Other Ambulatory Visit: Payer: Self-pay | Admitting: Internal Medicine

## 2011-02-16 ENCOUNTER — Other Ambulatory Visit: Payer: Self-pay | Admitting: *Deleted

## 2011-02-16 MED ORDER — MOMETASONE FUROATE 50 MCG/ACT NA SUSP: 2.0000 | Freq: Every day | NASAL | Status: DC

## 2011-05-31 ENCOUNTER — Ambulatory Visit: Payer: BC Managed Care – PPO | Admitting: Internal Medicine

## 2011-06-05 ENCOUNTER — Other Ambulatory Visit: Payer: Self-pay | Admitting: Internal Medicine

## 2011-06-08 ENCOUNTER — Ambulatory Visit: Payer: BC Managed Care – PPO | Admitting: Internal Medicine

## 2011-06-08 ENCOUNTER — Telehealth: Payer: Self-pay | Admitting: Internal Medicine

## 2011-06-08 MED ORDER — MONTELUKAST SODIUM 10 MG PO TABS: 10.0000 mg | ORAL_TABLET | Freq: Every day | ORAL | Status: DC

## 2011-06-08 NOTE — Telephone Encounter (Signed)
Rx was refilled x 1 only and LMOM for pt to be made aware that this was done and to keep appt for additional rx.

## 2011-06-25 ENCOUNTER — Other Ambulatory Visit: Payer: Self-pay | Admitting: Internal Medicine

## 2011-06-27 ENCOUNTER — Encounter: Payer: Self-pay | Admitting: Internal Medicine

## 2011-06-27 ENCOUNTER — Ambulatory Visit (INDEPENDENT_AMBULATORY_CARE_PROVIDER_SITE_OTHER): Payer: BC Managed Care – PPO | Admitting: Internal Medicine

## 2011-06-27 VITALS — BP 118/68 | HR 64 | Ht 64.0 in | Wt 165.4 lb

## 2011-06-27 DIAGNOSIS — J329 Chronic sinusitis, unspecified: Secondary | ICD-10-CM

## 2011-06-27 DIAGNOSIS — J45909 Unspecified asthma, uncomplicated: Secondary | ICD-10-CM

## 2011-06-27 DIAGNOSIS — J309 Allergic rhinitis, unspecified: Secondary | ICD-10-CM

## 2011-06-27 MED ORDER — MOMETASONE FUROATE 50 MCG/ACT NA SUSP: 2.0000 | Freq: Every day | NASAL | Status: DC

## 2011-06-27 MED ORDER — MONTELUKAST SODIUM 10 MG PO TABS: 10.0000 mg | ORAL_TABLET | Freq: Every day | ORAL | Status: DC

## 2011-06-27 MED ORDER — ALBUTEROL SULFATE HFA 108 (90 BASE) MCG/ACT IN AERS: 2.0000 | INHALATION_SPRAY | RESPIRATORY_TRACT | Status: DC | PRN

## 2011-06-27 MED ORDER — AMOXICILLIN-POT CLAVULANATE 875-125 MG PO TABS: 1.0000 | ORAL_TABLET | Freq: Two times a day (BID) | ORAL | Status: AC

## 2011-06-27 MED ORDER — FLUTICASONE-SALMETEROL 100-50 MCG/DOSE IN AEPB: 1.0000 | INHALATION_SPRAY | Freq: Two times a day (BID) | RESPIRATORY_TRACT | Status: DC

## 2011-06-27 NOTE — Patient Instructions (Signed)
Scripts refilled  Please call as needed 

## 2011-06-27 NOTE — Progress Notes (Signed)
Patient ID: Candace Cruise, female    DOB: [birth year], 60 y.o.   MRN: [id number]  HPI 11/28/10- 65 yo F never smoker, using Advair 20, Ventolin HFA, Singulair. Last here acutely ill with URI/ bronchitis September 14, 2009. That resolved. She was on augmentin for a sinusitis, ending a week or so ago. She feels completely well now. She would like an antibiotic script to hold. Using Ventolin rescue inhaler rarely since winter bronchitis cleared. Used some before exercise.   06/27/11-59 yo F never smoker, using Advair 20, Ventolin HFA, Singulair. Last here acutely ill with URI/ bronchitis September 14, 2009. Reports fever for a day or 2 after she had both flu and pneumonia vaccines a month ago. Now feels well, noticing some nasal congestion and right maxillary pressure with sneeze. Mostly she asks medication refills. She likes to keep Augmentin available and we discussed this. She starts to wheeze if she skips Advair.  Review of Systems Constitutional:   No-   weight loss, night sweats, fevers, chills, fatigue, lassitude. HEENT:   No-  headaches, difficulty swallowing, tooth/dental problems, sore throat,       No-  sneezing, itching, ear ache, +nasal congestion, post nasal drip,  CV:  No-   chest pain, orthopnea, PND, swelling in lower extremities, anasarca,  dizziness, palpitations Resp: No-   shortness of breath with exertion or at rest.              No-   productive cough,  No non-productive cough,  No- coughing up of blood.              No-   change in color of mucus.  No- wheezing.   Skin: No-   rash or lesions. GI:  No-   heartburn, indigestion, abdominal pain, nausea, vomiting, diarrhea,                 change in bowel habits, loss of appetite GU: No-   dysuria, change in color of urine, no urgency or frequency.  No- flank pain. MS:  No-   joint pain or swelling.  No- decreased range of motion.  No- back pain. Neuro-     nothing unusual Psych:  No- change in mood or affect. No depression or  anxiety.  No memory loss.  OBJ General- Alert, Oriented, Affect-appropriate, Distress- none acute Skin- rash-none, lesions- none, excoriation- none Lymphadenopathy- none Head- atraumatic            Eyes- Gross vision intact, PERRLA, conjunctivae clear secretions            Ears- Hearing, canals-normal            Nose- Clear, no-Septal dev, mucus, polyps, erosion, perforation             Throat- Mallampati II , mucosa clear , drainage- none, tonsils- atrophic Neck- flexible , trachea midline, no stridor , thyroid nl, carotid no bruit Chest - symmetrical excursion , unlabored           Heart/CV- RRR , no murmur , no gallop  , no rub, nl s1 s2                           - JVD- none , edema- none, stasis changes- none, varices- none           Lung- clear to P&A, wheeze- none, cough- none , dullness-none, rub- none  Chest wall-  Abd- tender-no, distended-no, bowel sounds-present, HSM- no Br/ Gen/ Rectal- Not done, not indicated Extrem- cyanosis- none, clubbing, none, atrophy- none, strength- nl Neuro- grossly intact to observation

## 2011-07-01 NOTE — Assessment & Plan Note (Signed)
We discussed use of decongestants and saline lavage. We are refilling Augmentin with discussion.

## 2011-07-01 NOTE — Assessment & Plan Note (Signed)
Advair is adequate but necessary. Using it, she rarely needs a rescue inhaler.

## 2011-07-01 NOTE — Assessment & Plan Note (Signed)
Control now is good.

## 2011-07-21 IMAGING — CR DG HIP (WITH OR WITHOUT PELVIS) 2-3V*L*
3 series · 3 of 3 positions shown · non-contrast
Comparison: 05/21/2006

CLINICAL DATA: Fall, left hip pain.

LEFT HIP - COMPLETE 2+ VIEW

[w hip lateral left (1 of 2)]
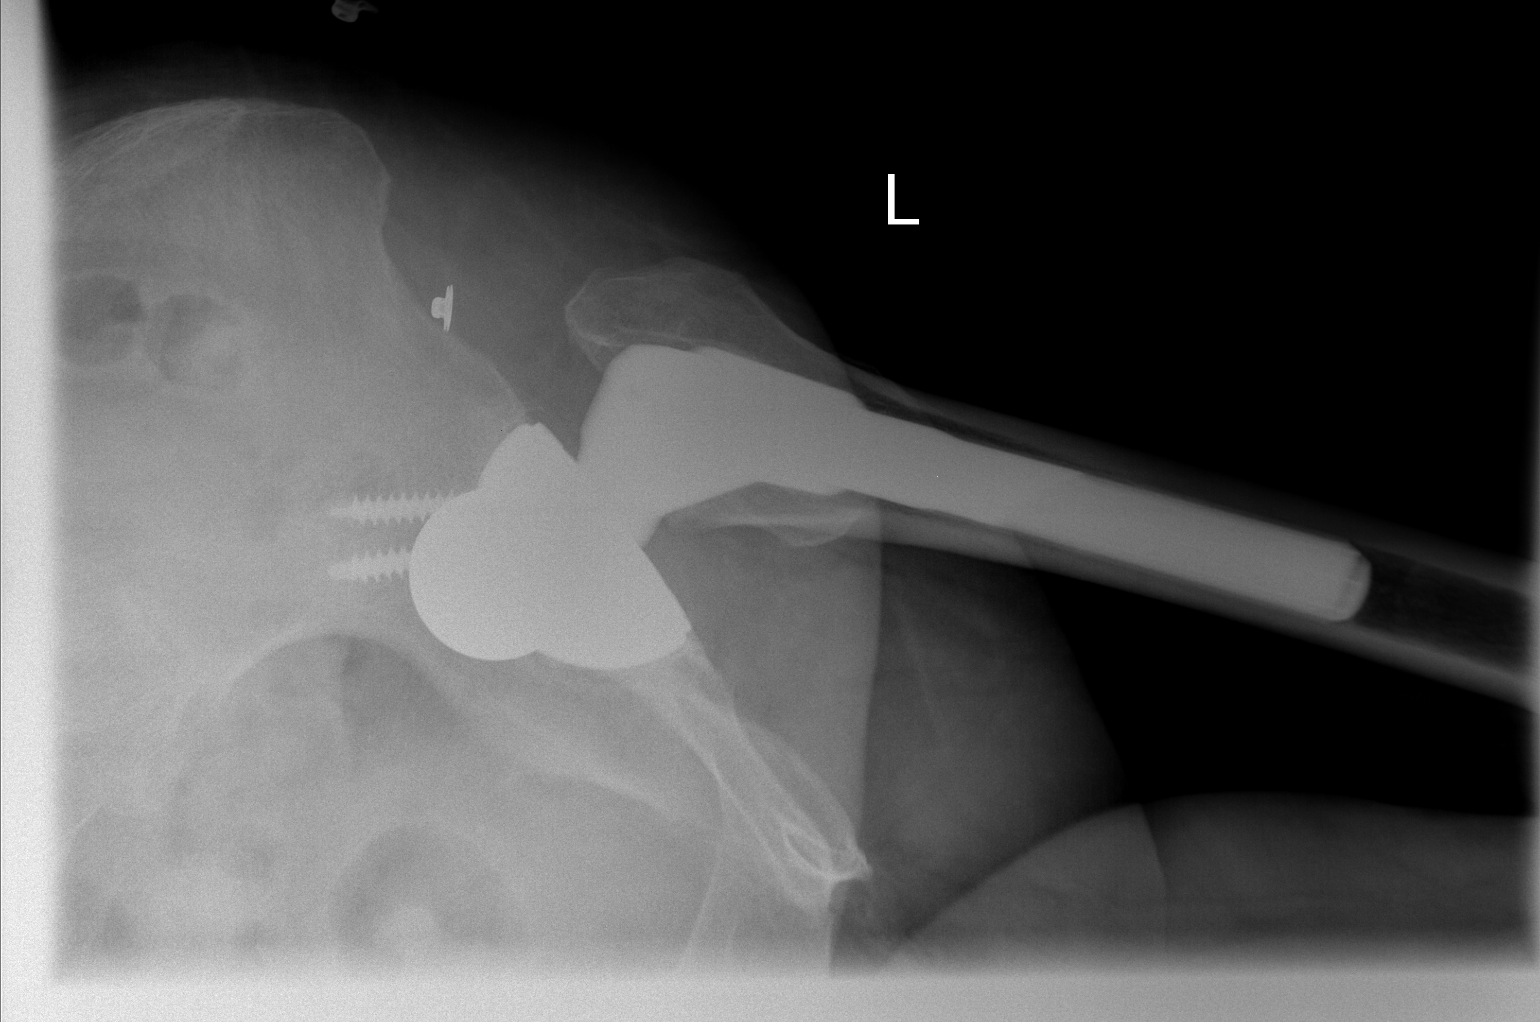

[w hip lateral left (2 of 2)]
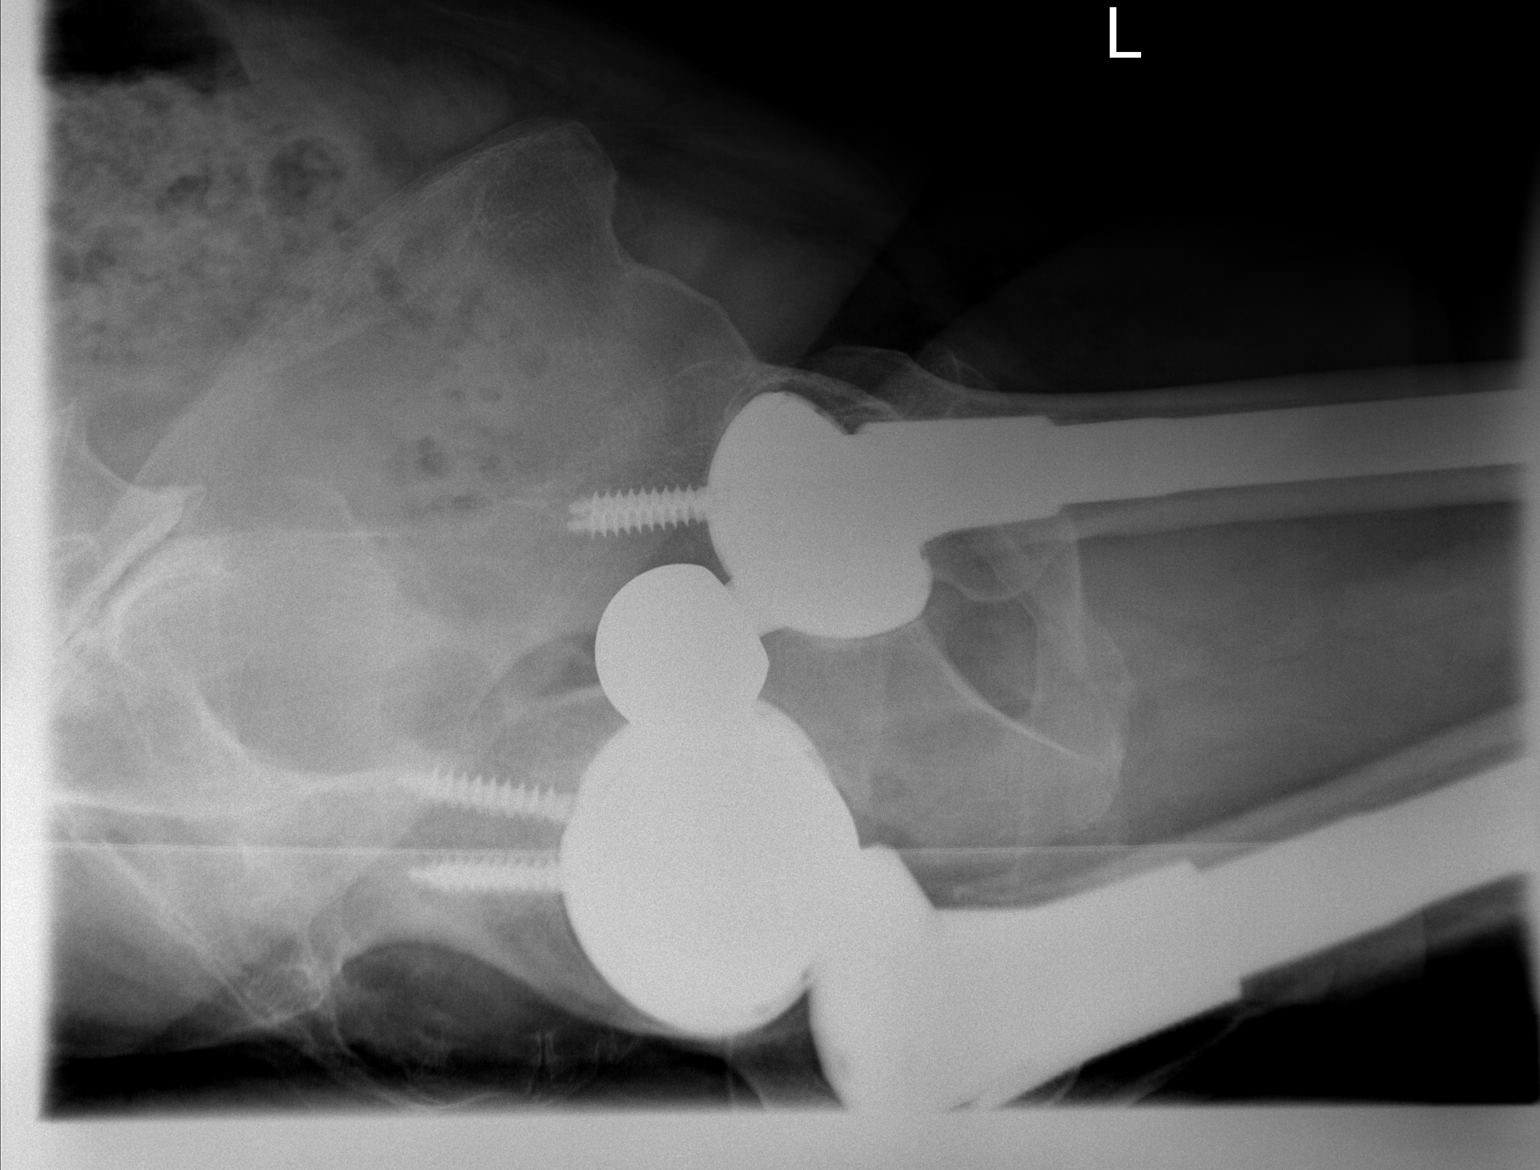

[t hip ap left]
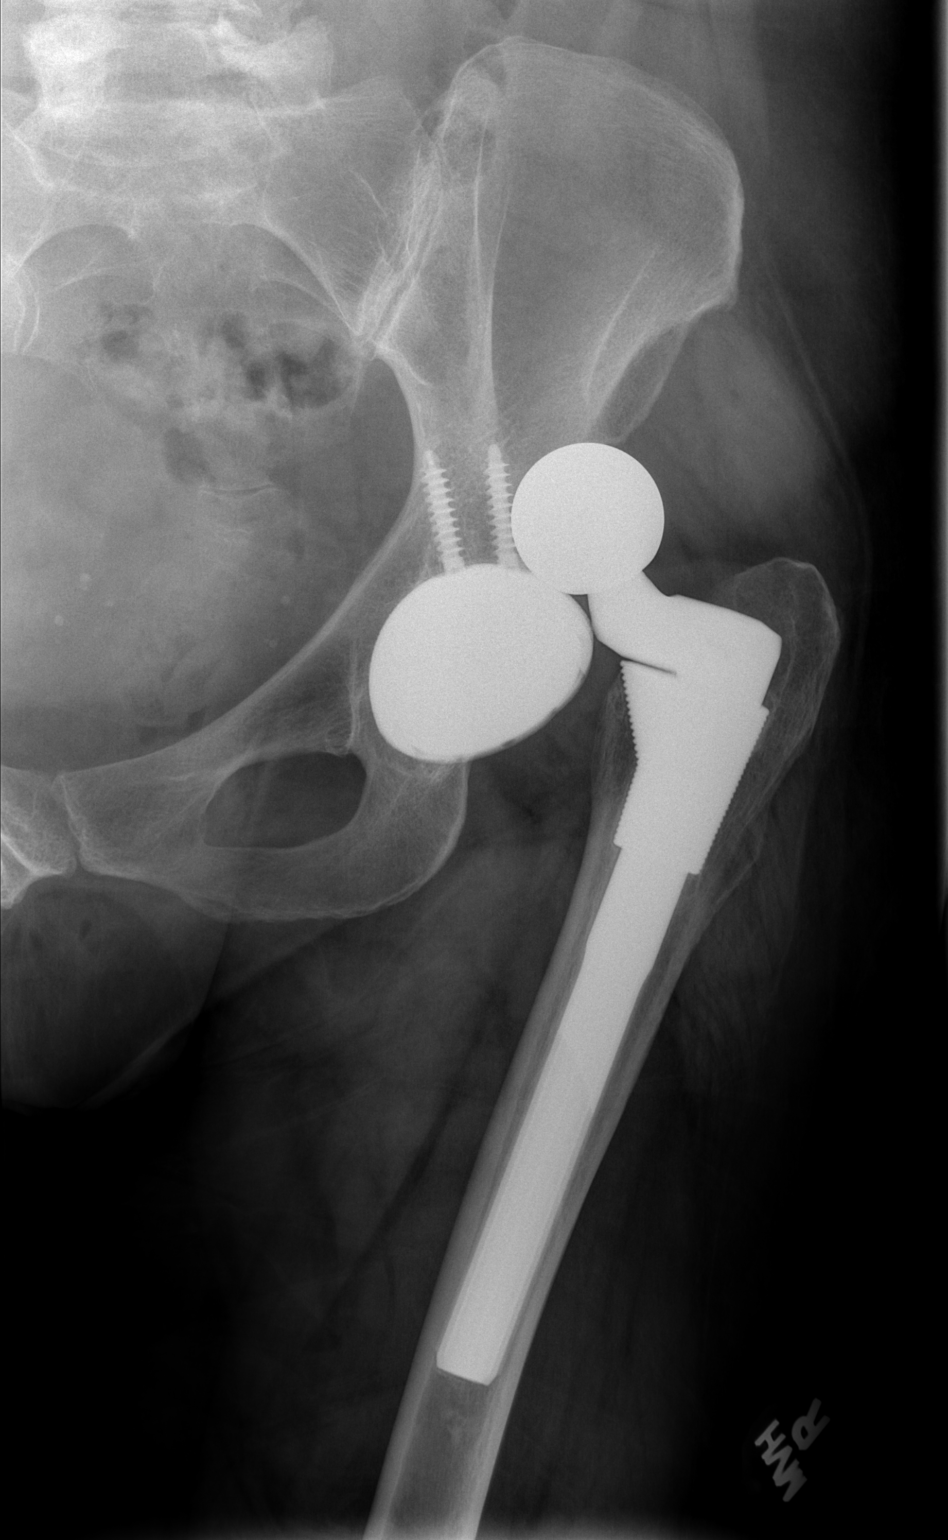

[3 of 3 positions shown; findings below may reference images not displayed]

FINDINGS: There is superior dislocation of the left hip
replacement.  No evidence of fracture.
IMPRESSION: Superiorly dislocated left hip replacement.

## 2011-07-21 IMAGING — CR DG HIP 1V PORT*L*
1 series · 1 of 1 positions shown · non-contrast
Comparison: [DATE]

CLINICAL DATA: Post reduction of hip dislocation

PORTABLE LEFT HIP - 1 VIEW

[series [date]]
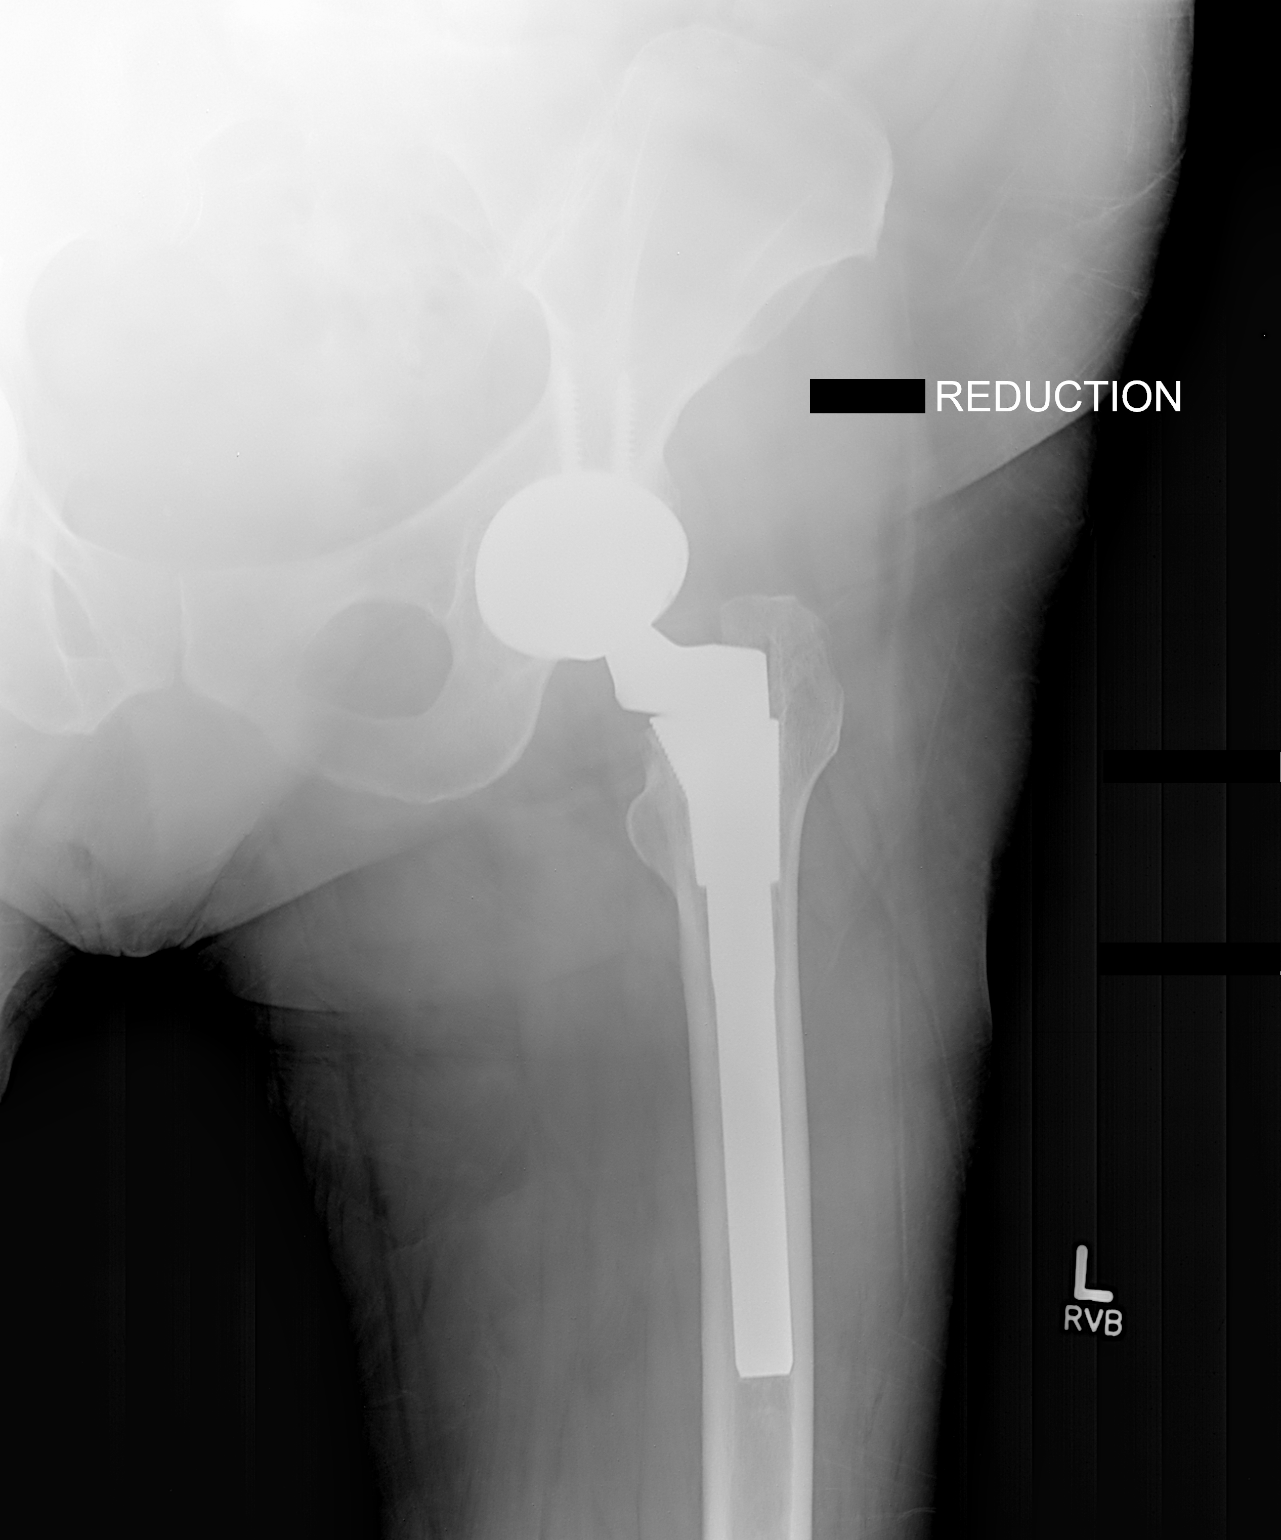

[1 of 1 positions shown; findings below may reference images not displayed]

FINDINGS: The prosthetic femoral head is relocated in the
prosthetic acetabulum.  No discernible complication.
IMPRESSION: Relocated

## 2011-07-21 IMAGING — CR DG CHEST 2V
1 series · 1 of 1 positions shown · non-contrast
Comparison: [DATE]

CLINICAL DATA: Fall, hip pain.

CHEST - 2 VIEW

[t chest supine]
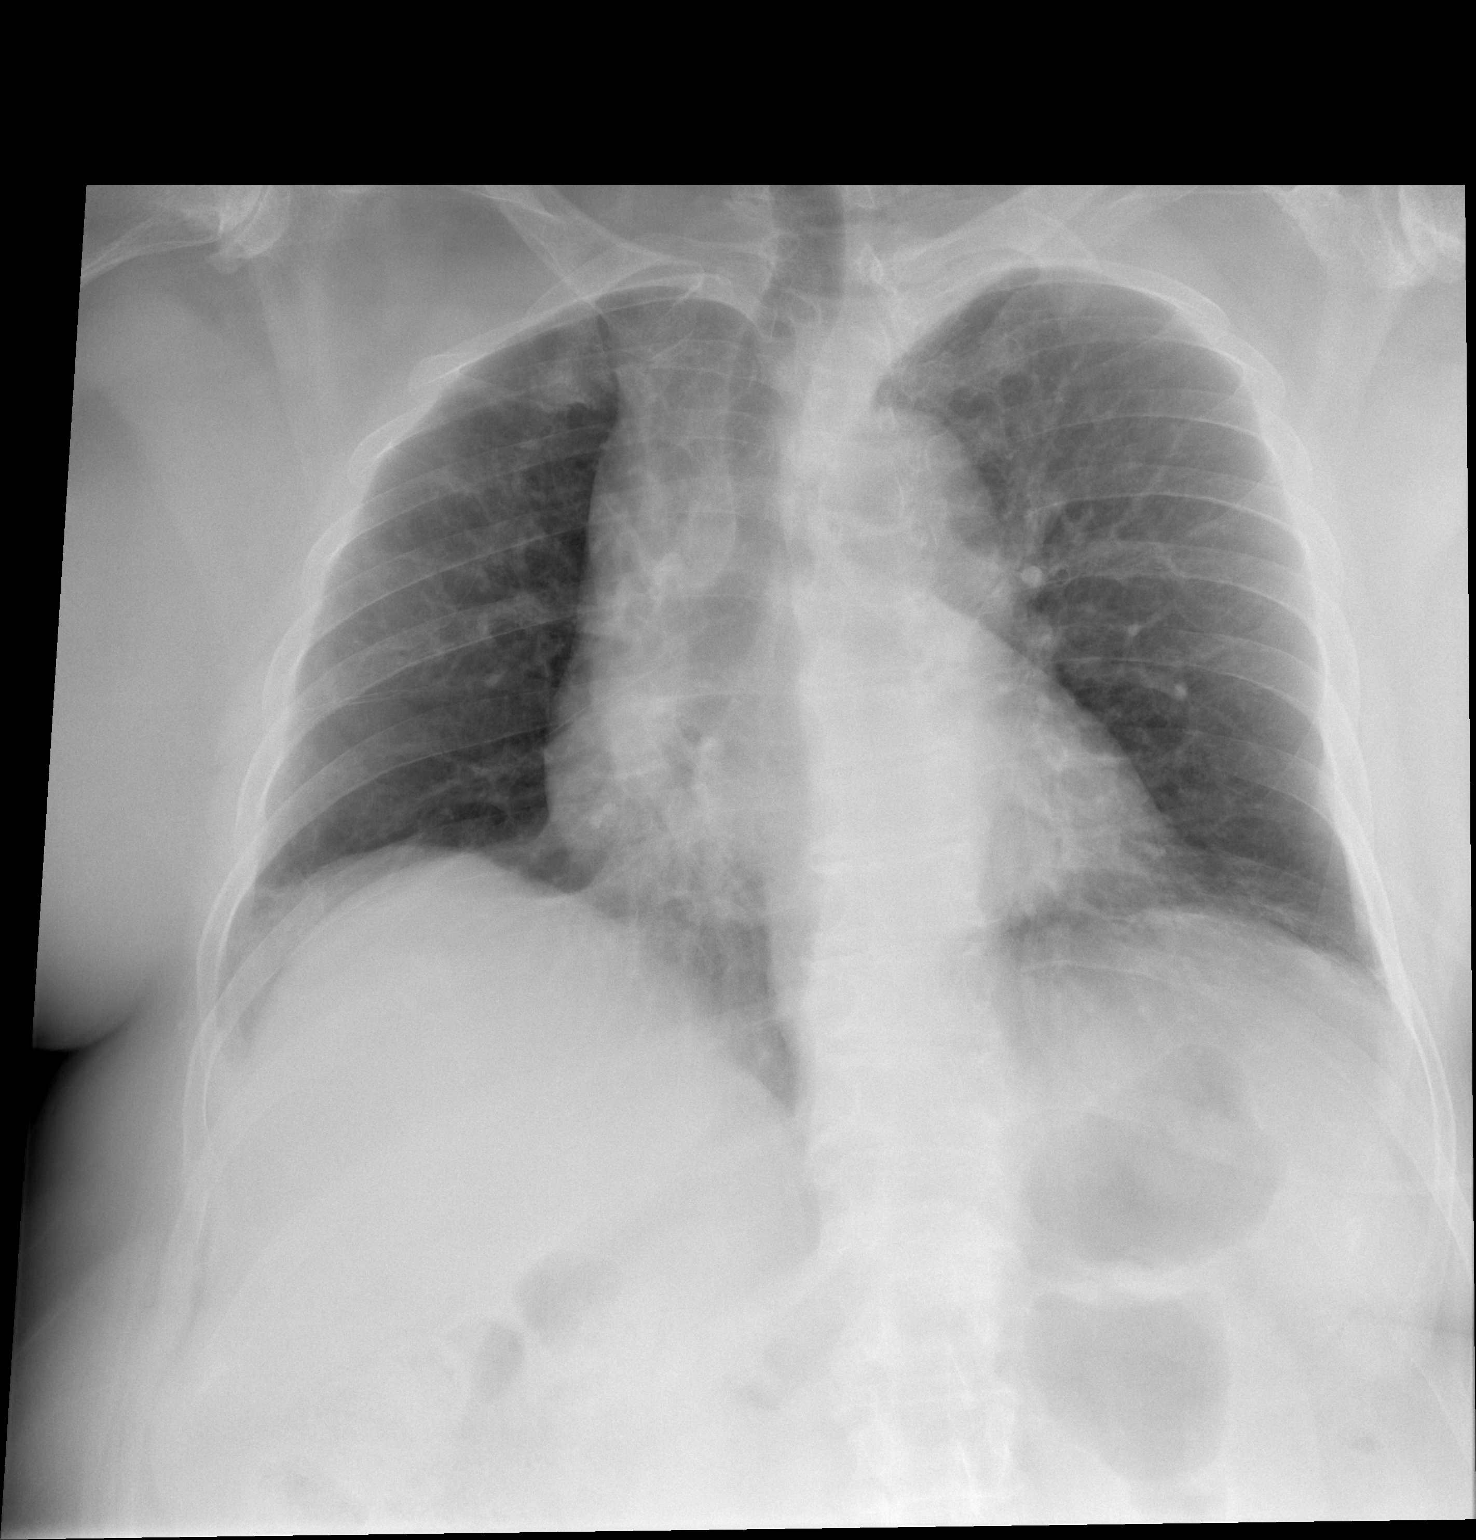

[1 of 1 positions shown; findings below may reference images not displayed]

FINDINGS: There is mild cardiomegaly.  Low lung volumes with
bibasilar atelectasis.  No effusions or edema.
IMPRESSION: Cardiomegaly.  Bibasilar atelectasis.

## 2011-07-30 ENCOUNTER — Other Ambulatory Visit: Payer: Self-pay | Admitting: Internal Medicine

## 2011-08-18 ENCOUNTER — Other Ambulatory Visit: Payer: Self-pay | Admitting: Internal Medicine

## 2011-09-14 ENCOUNTER — Other Ambulatory Visit: Payer: Self-pay | Admitting: Internal Medicine

## 2011-09-19 NOTE — Telephone Encounter (Signed)
Please advise if okay to refill as requested. Thanks.  

## 2011-09-20 ENCOUNTER — Telehealth: Payer: Self-pay | Admitting: Internal Medicine

## 2011-09-20 NOTE — Telephone Encounter (Signed)
States she gets sinus infections often and CY lets her have abx on hand in case; would like refill for this; used the last one on hand. Please advise if okay to send.

## 2011-09-20 NOTE — Telephone Encounter (Signed)
Ok to offer biaxin 500 mg, # 20, 1 twice daily after meals x 10 days, refill x 3

## 2011-09-21 MED ORDER — CLARITHROMYCIN 500 MG PO TABS: 500.0000 mg | ORAL_TABLET | Freq: Two times a day (BID) | ORAL | Status: AC

## 2011-09-21 NOTE — Telephone Encounter (Signed)
Was this done?

## 2011-09-21 NOTE — Telephone Encounter (Signed)
Called and spoke with pt.  Pt aware of CY's recs and rx sent to pharmacy.   

## 2011-11-07 ENCOUNTER — Other Ambulatory Visit: Payer: Self-pay | Admitting: Orthopedic Surgery

## 2011-12-24 ENCOUNTER — Emergency Department (HOSPITAL_COMMUNITY): Payer: BC Managed Care – PPO

## 2011-12-24 ENCOUNTER — Encounter (HOSPITAL_COMMUNITY): Payer: Self-pay | Admitting: Emergency Medicine

## 2011-12-24 ENCOUNTER — Emergency Department (HOSPITAL_COMMUNITY)
Admission: EM | Admit: 2011-12-24 | Discharge: 2011-12-25 | Disposition: A | Discharge status: Home / Self Care | Payer: BC Managed Care – PPO | Attending: Emergency Medicine | Admitting: Emergency Medicine

## 2011-12-24 DIAGNOSIS — M25569 Pain in unspecified knee: Secondary | ICD-10-CM

## 2011-12-24 DIAGNOSIS — I10 Essential (primary) hypertension: Secondary | ICD-10-CM | POA: Insufficient documentation

## 2011-12-24 DIAGNOSIS — IMO0002 Reserved for concepts with insufficient information to code with codable children: Secondary | ICD-10-CM

## 2011-12-24 DIAGNOSIS — W19XXXA Unspecified fall, initial encounter: Secondary | ICD-10-CM

## 2011-12-24 DIAGNOSIS — G319 Degenerative disease of nervous system, unspecified: Secondary | ICD-10-CM | POA: Insufficient documentation

## 2011-12-24 DIAGNOSIS — S0083XA Contusion of other part of head, initial encounter: Secondary | ICD-10-CM

## 2011-12-24 DIAGNOSIS — J45909 Unspecified asthma, uncomplicated: Secondary | ICD-10-CM | POA: Insufficient documentation

## 2011-12-24 DIAGNOSIS — K219 Gastro-esophageal reflux disease without esophagitis: Secondary | ICD-10-CM | POA: Insufficient documentation

## 2011-12-24 DIAGNOSIS — E7800 Pure hypercholesterolemia, unspecified: Secondary | ICD-10-CM | POA: Insufficient documentation

## 2011-12-24 DIAGNOSIS — Z79899 Other long term (current) drug therapy: Secondary | ICD-10-CM | POA: Insufficient documentation

## 2011-12-24 DIAGNOSIS — W010XXA Fall on same level from slipping, tripping and stumbling without subsequent striking against object, initial encounter: Secondary | ICD-10-CM | POA: Insufficient documentation

## 2011-12-24 DIAGNOSIS — S0003XA Contusion of scalp, initial encounter: Secondary | ICD-10-CM | POA: Insufficient documentation

## 2011-12-24 HISTORY — DX: Essential (primary) hypertension: I10

## 2011-12-24 HISTORY — DX: Pure hypercholesterolemia, unspecified: E78.00

## 2011-12-24 HISTORY — DX: Gastro-esophageal reflux disease without esophagitis: K21.9

## 2011-12-24 NOTE — ED Notes (Addendum)
Pt presented to the ER with facial pain secondary to the fall, pt state that she fell onto pavement, no bleeding noted, bruising to the face, denies loss of LOC

## 2011-12-24 NOTE — ED Provider Notes (Signed)
History     CSN: [id number]  Arrival date & time 12/24/11  2153   First MD Initiated Contact with Patient 12/24/11 2324      Chief Complaint  Patient presents with  . Fall  . Facial Injury  . Bleeding/Bruising    (Consider location/radiation/quality/duration/timing/severity/associated sxs/prior treatment) HPI Comments: Mechanical fall while getting out of her car. Tripped on uneven pavement. Struck her face. Was wearing sunglasses. Did not lose consciousness. Also has right knee pain  Patient is a 61 y.o. female presenting with fall. The history is provided by the patient. No language interpreter was used.  Fall The accident occurred 3 to 5 hours ago. The fall occurred while standing. She fell from a height of 3 to 5 ft. She landed on concrete. The volume of blood lost was minimal. The point of impact was the head (R knee). The pain is present in the head (r knee). The pain is mild. She was ambulatory at the scene. There was no entrapment after the fall. There was no drug use involved in the accident. There was no alcohol use involved in the accident. Pertinent negatives include no visual change, no fever, no numbness, no abdominal pain, no bowel incontinence, no nausea, no vomiting, no headaches, no hearing loss and no loss of consciousness. She has tried nothing for the symptoms.    Past Medical History  Diagnosis Date  . Asthma   . Acid reflux   . Hypertension   . High cholesterol     Past Surgical History  Procedure Date  . Bilateral hip replacement   . Cesarean section   . Breast reduction surgery   . Nasal septum surgery   . Bilateral hip replacement     Family History  Problem Relation Age of Onset  . Heart disease Father   . Cancer Father   . Arthritis Mother   . Pancreatic cancer    . Coronary artery disease Other   . Cancer Other   . Asthma Other     History  Substance Use Topics  . Smoking status: Never Smoker   . Smokeless tobacco: Not on file  .  Alcohol Use: Yes     social    OB History    Grav Para Term Preterm Abortions TAB SAB Ect Mult Living                  Review of Systems  Constitutional: Positive for fatigue. Negative for fever, chills, activity change and appetite change.  HENT: Positive for facial swelling. Negative for congestion, sore throat, rhinorrhea, neck pain and neck stiffness.   Eyes: Negative for photophobia, pain and visual disturbance.  Respiratory: Negative for cough, chest tightness and shortness of breath.   Cardiovascular: Negative for chest pain and palpitations.  Gastrointestinal: Negative for nausea, vomiting, abdominal pain and bowel incontinence.  Genitourinary: Negative for dysuria, urgency, frequency and flank pain.  Musculoskeletal: Negative for myalgias, back pain and arthralgias.  Skin: Positive for wound.  Neurological: Negative for dizziness, loss of consciousness, weakness, light-headedness, numbness and headaches.  All other systems reviewed and are negative.    Allergies  Mobic and Nadolol  Home Medications   Current Outpatient Rx  Name Route Sig Dispense Refill  . ADVAIR DISKUS 100-50 MCG/DOSE IN AEPB  INHALE 1 PUFF TWO TIMES A DAY 60 each 9  . ALBUTEROL SULFATE HFA 108 (90 BASE) MCG/ACT IN AERS Inhalation Inhale 2 puffs into the lungs every 4 (four) hours as needed for wheezing  or shortness of breath. 1 Inhaler prn  . AMLODIPINE BESYLATE 5 MG PO TABS Oral Take 5 mg by mouth daily.      . ASPIRIN 81 MG PO CHEW Oral Chew 81 mg by mouth daily.    . ATORVASTATIN CALCIUM 80 MG PO TABS Oral Take 80 mg by mouth daily.      Marland Kitchen CALCIUM GLUCONATE 500 MG PO TABS Oral Take 500 mg by mouth daily.      Marland Kitchen CETIRIZINE HCL 10 MG PO TABS Oral Take 10 mg by mouth daily as needed.     Marland Kitchen VITAMIN D 1000 UNITS PO CAPS  1 tablet by mouth and 2 by moth every evening     . ESOMEPRAZOLE MAGNESIUM 40 MG PO PACK Oral Take 40 mg by mouth daily before breakfast.      . ESTRADIOL-NORETHINDRONE ACET  0.05-0.25 MG/DAY TD PTTW Transdermal Place 1 patch onto the skin 2 (two) times a week.    Marland Kitchen ESZOPICLONE 1 MG PO TABS Oral Take 1 mg by mouth daily as needed. Take immediately before bedtime     . FENOFIBRATE MICRONIZED 200 MG PO CAPS Oral Take 200 mg by mouth daily before breakfast.      . FLUTICASONE-SALMETEROL 100-50 MCG/DOSE IN AEPB Inhalation Inhale 1 puff into the lungs 2 (two) times daily. Rinse mouth 60 each prn  . IBANDRONATE SODIUM 150 MG PO TABS Oral Take 150 mg by mouth every 30 (thirty) days. Take in the morning with a full glass of water, on an empty stomach, and do not take anything else by mouth or lie down for the next 30 min.    . MOMETASONE FUROATE 50 MCG/ACT NA SUSP Nasal Place 2 sprays into the nose daily. 17 g prn  . MONTELUKAST SODIUM 10 MG PO TABS Oral Take 1 tablet (10 mg total) by mouth at bedtime. 30 tablet prn  . MULTIVITAMINS PO CAPS Oral Take 1 capsule by mouth daily.      . TRAZODONE HCL 50 MG PO TABS Oral Take 50 mg by mouth at bedtime.      Marland Kitchen ESTRADIOL 0.0375 MG/24HR TD PTTW Transdermal Place 1 patch onto the skin 2 (two) times a week.      Marland Kitchen PROGESTERONE MICRONIZED 200 MG PO CAPS Oral Take 200 mg by mouth daily.        BP 135/91  Pulse 72  Temp(Src) 98.5 F (36.9 C) (Oral)  Resp 20  SpO2 98%  Physical Exam  Nursing note and vitals reviewed. Constitutional: She is oriented to person, place, and time. She appears well-developed and well-nourished. No distress.  HENT:  Head: Normocephalic and atraumatic.  Mouth/Throat: Oropharynx is clear and moist.  Eyes: Conjunctivae and EOM are normal. Pupils are equal, round, and reactive to light.       Ecchymosis to the nasal bridge. There is no evidence of septal hematoma. She is small amount of ecchymosis to the left inferior orbital rim  Neck: Normal range of motion. Neck supple.  Cardiovascular: Normal rate, regular rhythm, normal heart sounds and intact distal pulses.  Exam reveals no gallop and no friction rub.     No murmur heard. Pulmonary/Chest: Effort normal and breath sounds normal. No respiratory distress. She exhibits no tenderness.  Abdominal: Soft. Bowel sounds are normal. There is no tenderness. There is no rebound and no guarding.  Musculoskeletal: Normal range of motion. She exhibits no tenderness.  Neurological: She is alert and oriented to person, place, and time. No cranial  nerve deficit.  Skin: Skin is warm and dry.       1.5 cm superficial laceration to the L inferior orbital rim    ED Course  Procedures (including critical care time)  LACERATION REPAIR Performed by: Dayton Bailiff Authorized by: Dayton Bailiff Consent: Verbal consent obtained. Risks and benefits: risks, benefits and alternatives were discussed Consent given by: patient Patient identity confirmed: provided demographic data Prepped and Draped in normal sterile fashion Wound explored  Laceration Location: L inferior orbital rim  Laceration Length: 1.5cm  No Foreign Bodies seen or palpated  Skin closure: dermabond and steristrips  Number of sutures: 0  Technique: dermabond and steristrips  Patient tolerance: Patient tolerated the procedure well with no immediate complications.   Labs Reviewed - No data to display Ct Head Wo Contrast  12/25/2011  *RADIOLOGY REPORT*  Clinical Data: Injury to bridge of nose after fall.  CT HEAD WITHOUT CONTRAST  Technique:  Contiguous axial images were obtained from the base of the skull through the vertex without contrast.  Comparison: None.  Findings: Mild cerebral atrophy.  Ventricles and sulci appear otherwise symmetrical.  No mass effect or midline shift.  No abnormal extra-axial fluid collections.  Gray-white matter junctions are distinct.  Basal cisterns are not effaced.  No evidence of acute intracranial hemorrhage.  No depressed skull fractures.  Vascular calcifications.  Visualized paranasal sinuses and mastoid air cells are not opacified.  IMPRESSION: No acute  intracranial abnormalities.  Original Report Authenticated By: Marlon Pel, M.D.   Dg Knee Complete 4 Views Right  12/25/2011  *RADIOLOGY REPORT*  Clinical Data: Fall, anterior right knee pain  RIGHT KNEE - COMPLETE 4+ VIEW  Comparison: None.  Findings: Moderate tricompartmental degenerative change noted. Small suprapatellar effusion.  No fracture dislocation.  No radiopaque foreign body.  IMPRESSION: Moderate tricompartmental degenerative change and small suprapatellar effusion.  Original Report Authenticated By: Harrel Lemon, M.D.     1. Fall   2. Facial contusion   3. Laceration   4. Knee pain       MDM  Facial laceration repaired with Steri-Strips and Dermabond. Good approximation obtained. CT is negative for head injury. There is no septal hematoma. Chart and followup with her primary care physician in one week as needed. Tylenol and ibuprofen for pain. She is able ambulate without difficulty. She'll be discharged home.  Provided strict return precautions        Dayton Bailiff, MD 12/25/11 [phone]

## 2011-12-24 NOTE — ED Notes (Signed)
Pt presents to ED after a fall that occurred earlier today. Pt has bruising on nose and abrasions on both knees as well as underneath left eye. Pt denies LOC and states that pain is under control. Pt states she is here "to make sure everything ok". Pt A&O x4.

## 2011-12-25 NOTE — Discharge Instructions (Signed)
Contusion A contusion is a deep bruise. Contusions are the result of an injury that caused bleeding under the skin. The contusion may turn blue, purple, or yellow. Minor injuries will give you a painless contusion, but more severe contusions may stay painful and swollen for a few weeks.  CAUSES  A contusion is usually caused by a blow, trauma, or direct force to an area of the body. SYMPTOMS   Swelling and redness of the injured area.   Bruising of the injured area.   Tenderness and soreness of the injured area.   Pain.  DIAGNOSIS  The diagnosis can be made by taking a history and physical exam. An X-ray, CT scan, or MRI may be needed to determine if there were any associated injuries, such as fractures. TREATMENT  Specific treatment will depend on what area of the body was injured. In general, the best treatment for a contusion is resting, icing, elevating, and applying cold compresses to the injured area. Over-the-counter medicines may also be recommended for pain control. Ask your caregiver what the best treatment is for your contusion. HOME CARE INSTRUCTIONS   Put ice on the injured area.   Put ice in a plastic bag.   Place a towel between your skin and the bag.   Leave the ice on for 15 to 20 minutes, 3 to 4 times a day.   Only take over-the-counter or prescription medicines for pain, discomfort, or fever as directed by your caregiver. Your caregiver may recommend avoiding anti-inflammatory medicines (aspirin, ibuprofen, and naproxen) for 48 hours because these medicines may increase bruising.   Rest the injured area.   If possible, elevate the injured area to reduce swelling.  SEEK IMMEDIATE MEDICAL CARE IF:   You have increased bruising or swelling.   You have pain that is getting worse.   Your swelling or pain is not relieved with medicines.  MAKE SURE YOU:   Understand these instructions.   Will watch your condition.   Will get help right away if you are not  doing well or get worse.  Document Released: 05/31/2005 Document Revised: 08/10/2011 Document Reviewed: 06/26/2011 [facility] Patient Information 2012 [place], [place].  Tissue Adhesive Wound Care A wound can be repaired by using tissue adhesive. Tissue adhesive holds the skin together and allows faster healing. It forms a strong bond on the skin in about 1 minute and reaches its full strength in about 2 or 3 minutes. The adhesive disappears naturally while healing. Follow up is required if your caregiver wants to recheck for infection and to make sure your wound is healing properly.  You may need a tetanus shot if:  You cannot remember when you had your last tetanus shot.   You have never had a tetanus shot.   The injury broke your skin.  If you got a tetanus shot, your arm may swell, get red, and feel warm to the touch. This is common and not a problem. If you need a tetanus shot and you choose not to have one, there is a rare chance of getting tetanus. Sickness from tetanus can be serious. HOME CARE INSTRUCTIONS   Only take over-the-counter or prescription medicines for pain, discomfort, or fever as directed by your caregiver.   Showers are allowed. Do not soak the area containing the tissue adhesive. Do not take baths, swim, or use hot tubs. Do not use any soaps or ointments on the wound until it has healed.   If a bandage (dressing) has been  applied, follow your caregiver's instructions for how often to change the dressing.   Keep the dressing dry if one has been applied.   Do not scratch, pick, or rub the adhesive.   Do not place tape over the adhesive. The adhesive could come off when pulling the tape off.   Protect the wound from further injury until it is healed.   Protect the wound from sun and tanning bed exposure while it is healing and for several weeks after healing.   Keep all follow-up appointments as directed by your caregiver.  SEEK IMMEDIATE MEDICAL CARE IF:   Your  wound becomes red, swollen, hot, or tender.   You have increasing pain in the wound.   You have a red streak that goes away from the wound.   You have pus coming from the wound.   You have a fever.   You have shaking chills.   There is a bad smell coming from the wound.   The wound or adhesive breaks open.  MAKE SURE YOU:   Understand these instructions.   Will watch your condition.   Will get help right away if you are not doing well or get worse.  Document Released: 02/14/2001 Document Revised: 08/10/2011 Document Reviewed: 12/25/2010 [facility] Patient Information 2012 [place], [place].

## 2012-02-08 ENCOUNTER — Ambulatory Visit: Payer: BC Managed Care – PPO | Admitting: Internal Medicine

## 2012-02-28 ENCOUNTER — Encounter: Payer: Self-pay | Admitting: Internal Medicine

## 2012-02-28 ENCOUNTER — Ambulatory Visit (INDEPENDENT_AMBULATORY_CARE_PROVIDER_SITE_OTHER): Payer: BC Managed Care – PPO | Admitting: Internal Medicine

## 2012-02-28 VITALS — BP 110/72 | HR 79 | Ht 64.0 in | Wt 173.2 lb

## 2012-02-28 DIAGNOSIS — J329 Chronic sinusitis, unspecified: Secondary | ICD-10-CM

## 2012-02-28 DIAGNOSIS — J45909 Unspecified asthma, uncomplicated: Secondary | ICD-10-CM

## 2012-02-28 DIAGNOSIS — G4733 Obstructive sleep apnea (adult) (pediatric): Secondary | ICD-10-CM

## 2012-02-28 DIAGNOSIS — J45998 Other asthma: Secondary | ICD-10-CM

## 2012-02-28 MED ORDER — MONTELUKAST SODIUM 10 MG PO TABS: 10.0000 mg | ORAL_TABLET | Freq: Every day | ORAL | Status: DC

## 2012-02-28 MED ORDER — ALBUTEROL SULFATE HFA 108 (90 BASE) MCG/ACT IN AERS: 2.0000 | INHALATION_SPRAY | RESPIRATORY_TRACT | Status: DC | PRN

## 2012-02-28 MED ORDER — FLUTICASONE-SALMETEROL 100-50 MCG/DOSE IN AEPB: 1.0000 | INHALATION_SPRAY | Freq: Two times a day (BID) | RESPIRATORY_TRACT | Status: DC

## 2012-02-28 MED ORDER — AMOXICILLIN-POT CLAVULANATE 875-125 MG PO TABS: 1.0000 | ORAL_TABLET | Freq: Two times a day (BID) | ORAL | Status: DC

## 2012-02-28 MED ORDER — MOMETASONE FUROATE 50 MCG/ACT NA SUSP: NASAL | Status: DC

## 2012-02-28 NOTE — Patient Instructions (Addendum)
You are clear for necessary surgery and anesthesia from a pulmonary standpoint. You will want to tell the anesthesiologist that you have a diagnosis of sleep apnea which is not being treated now, so you should be watched. If necessary, they can put you on CPAP while sedated and sleeping.   You and I can look at the sleep apnea issue again when you come back to the office.  Med refills have been sent to your drug store.

## 2012-02-28 NOTE — Progress Notes (Signed)
Patient ID: [name], female    DOB: [birth year], 61 y.o.   MRN: [id number]  HPI 11/28/10- 56 yo F never smoker, using Advair 20, Ventolin HFA, Singulair. Last here acutely ill with URI/ bronchitis September 14, 2009. That resolved. She was on augmentin for a sinusitis, ending a week or so ago. She feels completely well now. She would like an antibiotic script to hold. Using Ventolin rescue inhaler rarely since winter bronchitis cleared. Used some before exercise.   06/27/11-59 yo F never smoker, using Advair 20, Ventolin HFA, Singulair. Last here acutely ill with URI/ bronchitis September 14, 2009. Reports fever for a day or 2 after she had both flu and pneumonia vaccines a month ago. Now feels well, noticing some nasal congestion and right maxillary pressure with sneeze. Mostly she asks medication refills. She likes to keep Augmentin available and we discussed this. She starts to wheeze if she skips Advair.  02/28/12- 56 yo F never smoker folowed for asthma, allergic rhinitis, complicated by hx OSA/ oral appliance, HBP, GERD Pt here today for surgery clearance > pt having knee surgery on July 15th 2013/ Dr Despina Hick. Pt state having no issues  at this time Pending left total knee replacement. 3 weeks ago acute bronchitis did not respond to Biaxin so she went to a walk-in clinic and was treated with amoxicillin. Now denies any routine cough or wheeze. We discussed her history of obstructive sleep apnea. She still snores. She never tried CPAP and never got used to an oral appliance. She does not notice daytime sleepiness much, especially since she retired. Continues Advair twice daily, using a rescue inhaler 2 or 3 times per month. PFT 05/30/10- FEV1 2.56/ 91%, FEV1/FVC 0.73, DLCO 84%  within normal limits.  Prior to Admission medications   Medication Sig Start Date End Date Taking? Authorizing Provider  ADVAIR DISKUS 100-50 MCG/DOSE AEPB INHALE 1 PUFF TWO TIMES A DAY 08/18/11 08/20/12 Yes [name]  D [name], MD  albuterol (VENTOLIN HFA) 108 (90 BASE) MCG/ACT inhaler Inhale 2 puffs into the lungs every 4 (four) hours as needed for wheezing or shortness of breath. 02/28/12 02/27/13 Yes [name] D Kayci Belleville, MD  amLODipine (NORVASC) 5 MG tablet Take 5 mg by mouth daily.     Yes Historical Provider, MD  aspirin 81 MG chewable tablet Chew 81 mg by mouth daily.   Yes Historical Provider, MD  atorvastatin (LIPITOR) 80 MG tablet Take 80 mg by mouth daily.     Yes Historical Provider, MD  calcium gluconate 500 MG tablet Take 500 mg by mouth daily.     Yes Historical Provider, MD  cetirizine (ZYRTEC) 10 MG tablet Take 10 mg by mouth daily as needed.    Yes Historical Provider, MD  Cholecalciferol (VITAMIN D) 1000 UNITS capsule 1 tablet by mouth and 2 by moth every evening    Yes Historical Provider, MD  cyclobenzaprine (FLEXERIL) 10 MG tablet Take 10 mg by mouth.  02/19/12  Yes Historical Provider, MD  esomeprazole (NEXIUM) 40 MG packet Take 40 mg by mouth daily before breakfast.     Yes Historical Provider, MD  estradiol-norethindrone [facility]) 0.05-0.25 MG/DAY Place 1 patch onto the skin 2 (two) times a week. Use's 1/2 a patch twice a week   Yes Historical Provider, MD  eszopiclone (LUNESTA) 1 MG TABS Take 1 mg by mouth daily as needed. Take immediately before bedtime    Yes Historical Provider, MD  fenofibrate micronized (LOFIBRA) 200 MG capsule Take 200 mg  by mouth daily before breakfast.     Yes Historical Provider, MD  Fluticasone-Salmeterol (ADVAIR DISKUS) 100-50 MCG/DOSE AEPB Inhale 1 puff into the lungs 2 (two) times daily. Rinse mouth 02/28/12 02/27/13 Yes [name] [name], MD  ibandronate (BONIVA) 150 MG tablet Take 150 mg by mouth every 30 (thirty) days. Take in the morning with a full glass of water, on an empty stomach, and do not take anything else by mouth or lie down for the next 30 min.   Yes Historical Provider, MD  mometasone (NASONEX) 50 MCG/ACT nasal spray Place 2 sprays into the nose daily.  06/27/11 06/26/12 Yes [name] D Yukari Flax, MD  montelukast (SINGULAIR) 10 MG tablet Take 1 tablet (10 mg total) by mouth at bedtime. 02/28/12 02/27/13 Yes [name] [name], MD  Multiple Vitamin (MULTIVITAMIN) capsule Take 1 capsule by mouth daily.     Yes Historical Provider, MD  Specialty Vitamins Products (MAGNESIUM, AMINO ACID CHELATE,) 133 MG tablet Take 1 tablet by mouth 2 (two) times daily.   Yes Historical Provider, MD  traZODone (DESYREL) 50 MG tablet Take 50 mg by mouth at bedtime.     Yes Historical Provider, MD  amoxicillin-clavulanate (AUGMENTIN) 875-125 MG per tablet Take 1 tablet by mouth 2 (two) times daily. 02/28/12 03/09/12  [name], MD  mometasone (NASONEX) 50 MCG/ACT nasal spray 1-2 puffs each nostril once daily at beditme 02/28/12 02/27/13  [name], MD   Past Medical History  Diagnosis Date  . Asthma   . Acid reflux   . Hypertension   . High cholesterol    Past Surgical History  Procedure Date  . Bilateral hip replacement   . Cesarean section   . Breast reduction surgery   . Nasal septum surgery   . Bilateral hip replacement    Family History  Problem Relation Age of Onset  . Heart disease Father   . Cancer Father   . Arthritis Mother   . Pancreatic cancer    . Coronary artery disease Other   . Cancer Other   . Asthma Other    History   Social History  . Marital Status: Married    Spouse Name: N/A    Number of Children: 1  . Years of Education: N/A   Occupational History  . RETIRED    Social History Main Topics  . Smoking status: Never Smoker   . Smokeless tobacco: Not on file  . Alcohol Use: Yes     social  . Drug Use: No  . Sexually Active: Not on file   Other Topics Concern  . Not on file   Social History Narrative  . No narrative on file   Review of Systems- see HPI Constitutional:   No-   weight loss, night sweats, fevers, chills, fatigue, lassitude. HEENT:   No-  headaches, difficulty swallowing, tooth/dental problems, sore  throat,       No-  sneezing, itching, ear ache, +nasal congestion, post nasal drip,  CV:  No-   chest pain, orthopnea, PND, swelling in lower extremities, anasarca,  dizziness, palpitations Resp: No-   shortness of breath with exertion or at rest.              No-   productive cough,  No non-productive cough,  No- coughing up of blood.              No-   change in color of mucus.  No- wheezing.   Skin: No-   rash  or lesions. GI:  No-   heartburn, indigestion, abdominal pain, nausea, vomiting, GU: . MS:  See HPI-  joint pain or swelling.   Neuro-     nothing unusual Psych:  No- change in mood or affect. No depression or anxiety.  No memory loss.  OBJ BP 110/72  Pulse 79  Ht 5\' 4"  (1.626 m)  Wt 173 lb 3.2 oz (78.563 kg)  BMI 29.73 kg/m2  SpO2 97% General- Alert, Oriented, Affect-appropriate, Distress- none acute Skin- rash-none, lesions- none, excoriation- none Lymphadenopathy- none Head- atraumatic            Eyes- Gross vision intact, PERRLA, conjunctivae clear secretions            Ears- Hearing, canals-normal            Nose- Clear, no-Septal dev, mucus, polyps, erosion, perforation             Throat- Mallampati II-III , mucosa clear , drainage- none, tonsils- atrophic Neck- flexible , trachea midline, no stridor , thyroid nl, carotid no bruit Chest - symmetrical excursion , unlabored           Heart/CV- RRR , no murmur , no gallop  , no rub, nl s1 s2                           - JVD- none , edema- none, stasis changes- none, varices- none           Lung- clear to P&A, wheeze- none, cough- none , dullness-none, rub- none           Chest wall-  Abd-  Br/ Gen/ Rectal- Not done, not indicated Extrem- cyanosis- none, clubbing, none, atrophy- none, strength- nl Neuro- grossly intact to observation

## 2012-03-03 NOTE — Assessment & Plan Note (Signed)
Her anesthesiologist and surgeon should be aware of her history of obstructive sleep apnea, untreated. When she returns after surgery we will discuss reassessment for this issue.

## 2012-03-03 NOTE — Assessment & Plan Note (Addendum)
Good control now after an acute bronchitis episode 3 weeks ago. Normal PFTs in 2011. She is stable now for necessary surgery and general anesthesia. Plan-refill her medications-Advair, Singulair, Ventolin rescue inhaler, Nasonex. Prescription for Augmentin to hold in case of exacerbation.

## 2012-03-03 NOTE — Assessment & Plan Note (Signed)
Currently well controlled.  

## 2012-03-05 ENCOUNTER — Encounter (HOSPITAL_COMMUNITY): Payer: Self-pay | Admitting: Pharmacy Technician

## 2012-03-12 ENCOUNTER — Other Ambulatory Visit: Payer: Self-pay | Admitting: Orthopedic Surgery

## 2012-03-12 ENCOUNTER — Encounter (HOSPITAL_COMMUNITY): Payer: Self-pay

## 2012-03-12 ENCOUNTER — Other Ambulatory Visit: Payer: Self-pay | Admitting: Surgical

## 2012-03-12 ENCOUNTER — Encounter (HOSPITAL_COMMUNITY)
Admission: RE | Admit: 2012-03-12 | Discharge: 2012-03-12 | Disposition: A | Discharge status: Home / Self Care | Payer: BC Managed Care – PPO | Source: Ambulatory Visit | Attending: Orthopedic Surgery | Admitting: Orthopedic Surgery

## 2012-03-12 ENCOUNTER — Ambulatory Visit (HOSPITAL_COMMUNITY)
Admission: RE | Admit: 2012-03-12 | Discharge: 2012-03-12 | Disposition: A | Discharge status: Home / Self Care | Payer: BC Managed Care – PPO | Source: Ambulatory Visit | Attending: Surgical | Admitting: Surgical

## 2012-03-12 DIAGNOSIS — Z01810 Encounter for preprocedural cardiovascular examination: Secondary | ICD-10-CM | POA: Insufficient documentation

## 2012-03-12 DIAGNOSIS — M1710 Unilateral primary osteoarthritis, unspecified knee: Secondary | ICD-10-CM | POA: Insufficient documentation

## 2012-03-12 DIAGNOSIS — I10 Essential (primary) hypertension: Secondary | ICD-10-CM | POA: Insufficient documentation

## 2012-03-12 DIAGNOSIS — Z01812 Encounter for preprocedural laboratory examination: Secondary | ICD-10-CM | POA: Insufficient documentation

## 2012-03-12 HISTORY — DX: Cardiac murmur, unspecified: R01.1

## 2012-03-12 HISTORY — DX: Cardiac arrhythmia, unspecified: I49.9

## 2012-03-12 HISTORY — DX: Unspecified osteoarthritis, unspecified site: M19.90

## 2012-03-12 HISTORY — DX: Sleep apnea, unspecified: G47.30

## 2012-03-12 LAB — COMPREHENSIVE METABOLIC PANEL
ALT: 34 U/L (ref 0–35)
AST: 58 U/L — ABNORMAL HIGH (ref 0–37)
Albumin: 4 g/dL (ref 3.5–5.2)
Alkaline Phosphatase: 101 U/L (ref 39–117)
BUN: 17 mg/dL (ref 6–23)
CO2: 29 mEq/L (ref 19–32)
Calcium: 10 mg/dL (ref 8.4–10.5)
Chloride: 101 mEq/L (ref 96–112)
Creatinine, Ser: 1.04 mg/dL (ref 0.50–1.10)
GFR calc Af Amer: 65 mL/min — ABNORMAL LOW (ref >90)
GFR calc non Af Amer: 56 mL/min — ABNORMAL LOW (ref >90)
Glucose, Bld: 102 mg/dL — ABNORMAL HIGH (ref 70–99)
Potassium: 4.7 mEq/L (ref 3.5–5.1)
Sodium: 139 mEq/L (ref 135–145)
Total Bilirubin: 0.5 mg/dL (ref 0.3–1.2)
Total Protein: 7.6 g/dL (ref 6.0–8.3)

## 2012-03-12 LAB — DIFFERENTIAL
Basophils Absolute: 0 10*3/uL (ref 0.0–0.1)
Basophils Relative: 1 % (ref 0–1)
Eosinophils Absolute: 0.1 10*3/uL (ref 0.0–0.7)
Eosinophils Relative: 1 % (ref 0–5)
Lymphocytes Relative: 48 % — ABNORMAL HIGH (ref 12–46)
Lymphs Abs: 1.7 10*3/uL (ref 0.7–4.0)
Monocytes Absolute: 0.4 10*3/uL (ref 0.1–1.0)
Monocytes Relative: 11 % (ref 3–12)
Neutro Abs: 1.4 10*3/uL — ABNORMAL LOW (ref 1.7–7.7)
Neutrophils Relative %: 39 % — ABNORMAL LOW (ref 43–77)

## 2012-03-12 LAB — URINALYSIS, ROUTINE W REFLEX MICROSCOPIC
Bilirubin Urine: NEGATIVE
Glucose, UA: NEGATIVE mg/dL
Hgb urine dipstick: NEGATIVE
Ketones, ur: NEGATIVE mg/dL
Leukocytes, UA: NEGATIVE
Nitrite: NEGATIVE
Protein, ur: NEGATIVE mg/dL
Specific Gravity, Urine: 1.01 (ref 1.005–1.030)
Urobilinogen, UA: 0.2 mg/dL (ref 0.0–1.0)
pH: 6.5 (ref 5.0–8.0)

## 2012-03-12 LAB — CBC
HCT: 40.6 % (ref 36.0–46.0)
Hemoglobin: 13 g/dL (ref 12.0–15.0)
MCH: 25 pg — ABNORMAL LOW (ref 26.0–34.0)
MCHC: 32 g/dL (ref 30.0–36.0)
MCV: 78.1 fL (ref 78.0–100.0)
Platelets: 311 10*3/uL (ref 150–400)
RBC: 5.2 MIL/uL — ABNORMAL HIGH (ref 3.87–5.11)
RDW: 16 % — ABNORMAL HIGH (ref 11.5–15.5)
WBC: 3.5 10*3/uL — ABNORMAL LOW (ref 4.0–10.5)

## 2012-03-12 LAB — SURGICAL PCR SCREEN
MRSA, PCR: NEGATIVE
Staphylococcus aureus: NEGATIVE

## 2012-03-12 LAB — PROTIME-INR
INR: 0.99 (ref 0.00–1.49)
Prothrombin Time: 13.3 seconds (ref 11.6–15.2)

## 2012-03-12 LAB — APTT: aPTT: 32 seconds (ref 24–37)

## 2012-03-12 MED ORDER — BUPIVACAINE 0.25 % ON-Q PUMP SINGLE CATH 300ML: 300.0000 mL | INJECTION | Status: DC

## 2012-03-12 MED ORDER — DEXAMETHASONE SODIUM PHOSPHATE 10 MG/ML IJ SOLN: 10.0000 mg | Freq: Once | INTRAMUSCULAR | Status: DC

## 2012-03-12 NOTE — Patient Instructions (Signed)
20 ROSARY FILOSA  03/12/2012   Your procedure is scheduled on: 03/18/12  Surgery [id number]   Monday   Report to [facility] at    0845   AM.  Call this number if you have problems the morning of surgery: [phone]     Or PST   [phone]  Deliah Goody   Remember: Janan Halter WITH YOU TO HOSPITAL  Do not eat food or drink any fluids :After Midnight.  Sunday NIGHT    Take these medicines the morning of surgery with A SIP OF WATER: Advair        AMLODIPINE, NEXIUM MAY USE VENTOLIN, NASONEX   IF NEEDED  OR TAKE ZYRTEC, FLEXARIL if needed  Do not wear jewelry, make-up or nail polish.  Do not wear lotions, powders, or perfumes. You may wear deodorant.  Do not shave 48 hours prior to surgery.  Do not bring valuables to the hospital.  Contacts, dentures or bridgework may not be worn into surgery.  Leave suitcase in the car. After surgery it may be brought to your room.  For patients admitted to the hospital, checkout time is 11:00 AM the day of discharge.   Patients discharged the day of surgery will not be allowed to drive home.  Name and phone number of your driver:    husband                                                                  Special Instructions: CHG Shower Use Special Wash: 1/2 bottle night before surgery and 1/2 bottle morning of surgery. REGULAR SOAP FACE AND PRIVATES              LADIES- NO SHAVING 48 HOURS BEFORE USING BETASEPT SOAP.                  Please read over the following fact sheets that you were given: MRSA Information

## 2012-03-14 NOTE — Pre-Procedure Instructions (Signed)
EKG reviewed by Dr Rica Mast-" OK"

## 2012-03-17 ENCOUNTER — Other Ambulatory Visit: Payer: Self-pay | Admitting: Orthopedic Surgery

## 2012-03-17 NOTE — H&P (Signed)
[name] 03/12/2012 3:50 PM Location: [facility] Patient #: [id number] DOB: 02/01/1951 Married / Language: English / Race: White Female   History of Present Illness The patient is a 61 year old female who comes in for a preoperative History and Physical. The patient is scheduled for a left total knee arthroplasty to be performed by Dr. Gus Rankin. Aluisio, MD at [facility] on 03/18/2012. The patient is being followed for their left knee pain and osteoarthritis. Symptoms reported today include: pain (that is getting progressively worse) and swelling, while the patient does not report symptoms of: instability. The following medication has been used for pain control: none. The left knee is becoming more problematic. This is starting to limit her more. She is getting increased pain. She does not have swelling. This occasionally wants to give out on her. She is ready for surgery. They have been treated conservatively in the past for the above stated problem and despite conservative measures, they continue to have progressive pain and severe functional limitations and dysfunction. They have failed non-operative management. It is felt that they would benefit from undergoing total joint replacement. Risks and benefits of the procedure have been discussed with the patient and they elect to proceed with surgery. There are no active contraindications to surgery such as ongoing infection or rapidly progressive neurological disease.   Problem List/Past Medical Osteoarthritis, Knee (715.96) Cancer, significant illness PVC's occasionally -- formerly took Counsellor -- now unmedicated Sleep Apnea Hypercholesterolemia Asthma Gastroesophageal Reflux Disease High blood pressure Menopause  Allergies No Known Drug Allergies  Family History Kidney disease. child Hypertension. father Severe allergy. mother and sister Osteoarthritis. mother and sister Congestive Heart Failure.  mother Cancer. father and grandmother fathers side Heart disease in female family member before age 36 Heart Disease. father  Social History Number of flights of stairs before winded. 2-3 Most recent primary occupation. former Runner, broadcasting/film/video Marital status. married Pain Contract. no Tobacco use. never smoker Tobacco / smoke exposure. no Previously in rehab. no Current work status. retired Copywriter, advertising. 1 Alcohol use. current drinker; drinks wine and hard liquor; only occasionally per week Drug/Alcohol Rehab (Currently). no Living situation. live with spouse Illicit drug use. no Exercise. Exercises daily; does running / walking, other and gym / weights  Medication History Advair Diskus (100-50MCG/DOSE Aero Pow Br Act, Inhalation) Active. AmLODIPine Besylate (5MG  Tablet, Oral) Active. Atorvastatin Calcium (40MG  Tablet, Oral) Active. Fenofibrate Micronized (200MG  Capsule, Oral) Active. CombiPatch (0.05-0.25MG /DAY Patch Biweekly, Transdermal) Active. Lunesta (3MG  Tablet, Oral) Active. Nasonex (50MCG/ACT Suspension, Nasal) Active. NexIUM (40MG  Capsule DR, Oral) Active. TraZODone HCl (50MG  Tablet, Oral) Active. Ventolin HFA (108 (90 Base)MCG/ACT Aerosol Soln, Inhalation) Active. Vivelle-Dot (0.0375MG /24HR Patch Biweekly, Transdermal) Active. Ibandronate Sodium (150MG  Tablet, Oral) Active. Multiple Vitamin (1 Oral) Active. Calcium Citrate (1 Oral) Specific dose unknown - Active. Vitamin D (1 Oral) Specific dose unknown - Active. Magnesium Chloride ( Oral) Specific dose unknown - Active.  Past Surgical History Mammoplasty; Reduction. bilateral Sinus Surgery Total Hip Replacement. bilateral Cesarean Delivery. 1 time Colon Polyp Removal - Colonoscopy Foot Surgery. right  Review of Systems General:Not Present- Chills, Fever, Night Sweats, Fatigue, Weight Gain, Weight Loss and Memory Loss. Skin:Not Present- Hives, Itching, Rash, Eczema and Lesions. HEENT:Not  Present- Tinnitus, Headache, Double Vision, Visual Loss, Hearing Loss and Dentures. Respiratory:Not Present- Shortness of breath with exertion, Shortness of breath at rest, Allergies, Coughing up blood and Chronic Cough. Cardiovascular:Not Present- Chest Pain, Racing/skipping heartbeats, Difficulty Breathing Lying Down, Murmur, Swelling and Palpitations. Gastrointestinal:Not Present- Bloody Stool,  Heartburn, Abdominal Pain, Vomiting, Nausea, Constipation, Diarrhea, Difficulty Swallowing, Jaundice and Loss of appetitie. Female Genitourinary:Not Present- Blood in Urine, Urinary frequency, Weak urinary stream, Discharge, Flank Pain, Incontinence, Painful Urination, Urgency, Urinary Retention and Urinating at Night. Musculoskeletal:Not Present- Muscle Weakness, Muscle Pain, Joint Swelling, Joint Pain, Back Pain, Morning Stiffness and Spasms. Neurological:Not Present- Tremor, Dizziness, Blackout spells, Paralysis, Difficulty with balance and Weakness. Psychiatric:Not Present- Insomnia.   Vitals Weight: 174 lb Height: 65 in Body Surface Area: 1.9 m Body Mass Index: 28.95 kg/m Pulse: 72 (Regular) Resp.: 14 (Unlabored) BP: 108/72 (Sitting, Right Arm, Standard)    Physical Exam(DREW L Tacha Manni, PA-C; 03/12/2012 9:32 PM) The physical exam findings are as follows:  Patient is a 61 year old female with continued knee pain. Patient is accompanied today by her husband.   General Mental Status - Alert, cooperative and good historian. General Appearance- pleasant. Not in acute distress. Orientation- Oriented X3. Build & Nutrition- Well nourished and Well developed.   Head and Neck Head- normocephalic, atraumatic . Neck Global Assessment- supple. no bruit auscultated on the right and no bruit auscultated on the left.   Eye Pupil- Bilateral- Regular and Round. Motion- Bilateral- EOMI.   Chest and Lung Exam Auscultation: Breath sounds:- clear at anterior  chest wall and - clear at posterior chest wall. Adventitious sounds:- No Adventitious sounds.   Cardiovascular Auscultation:Rhythm- Regular rate and rhythm. Heart Sounds- S1 WNL and S2 WNL. Murmurs & Other Heart Sounds:Auscultation of the heart reveals - No Murmurs.   Abdomen Palpation/Percussion:Tenderness- Abdomen is non-tender to palpation. Rigidity (guarding)- Abdomen is soft. Auscultation:Auscultation of the abdomen reveals - Bowel sounds normal.   Female Genitourinary Not done, not pertinent to present illness  Musculoskeletal She is alert and oriented. No apparent distress. Both hips can be flexed to 120, rotated in 30, out 40 and abducted to 40 without discomfort. The left knee shows no effusion. She has a varus deformity. There is a marked crepitus on range of motion of the knee. She is tender medially. No lateral tenderness or any instability noted.  RADIOGRAPHS: AP pelvis, AP and lateral of both hips show both prosthesis to be in excellent position with no periprosthetic abnormalities. AP of both knees and lateral of the left show bone on bone arthritis of the medial compartment and patellofemoral compartments of the left knee. She has a varus deformity with large osteophytes. AP of the right knee shows narrowing of the medial joint line.  Assessment & Plan Osteoarthritis,  Left Knee greater than Right Knee   Note: Patient is for Left Total Knee Replacement and a right knee cortisone injection by Dr. Lequita Halt.  Plan is to go home.  PCP - Dr. Selena Batten Pulm - Dr. Fannie Knee  Signed electronically by Roberts Gaudy, PA-C

## 2012-03-18 ENCOUNTER — Inpatient Hospital Stay (HOSPITAL_COMMUNITY)
Admission: RE | Admit: 2012-03-18 | Discharge: 2012-03-21 | DRG: 209 | Disposition: A | Discharge status: Home Health | Payer: BC Managed Care – PPO | Source: Ambulatory Visit | Attending: Orthopedic Surgery | Admitting: Orthopedic Surgery

## 2012-03-18 ENCOUNTER — Ambulatory Visit (HOSPITAL_COMMUNITY): Payer: BC Managed Care – PPO | Admitting: Anesthesiology

## 2012-03-18 ENCOUNTER — Encounter (HOSPITAL_COMMUNITY): Payer: Self-pay | Admitting: Anesthesiology

## 2012-03-18 ENCOUNTER — Encounter (HOSPITAL_COMMUNITY): Payer: Self-pay | Admitting: *Deleted

## 2012-03-18 ENCOUNTER — Encounter (HOSPITAL_COMMUNITY)
Admission: RE | Disposition: A | Discharge status: Home Health | Payer: Self-pay | Source: Ambulatory Visit | Attending: Orthopedic Surgery

## 2012-03-18 DIAGNOSIS — I499 Cardiac arrhythmia, unspecified: Secondary | ICD-10-CM | POA: Diagnosis present

## 2012-03-18 DIAGNOSIS — I10 Essential (primary) hypertension: Secondary | ICD-10-CM | POA: Diagnosis present

## 2012-03-18 DIAGNOSIS — Z96649 Presence of unspecified artificial hip joint: Secondary | ICD-10-CM

## 2012-03-18 DIAGNOSIS — E7800 Pure hypercholesterolemia, unspecified: Secondary | ICD-10-CM | POA: Diagnosis present

## 2012-03-18 DIAGNOSIS — Z79899 Other long term (current) drug therapy: Secondary | ICD-10-CM

## 2012-03-18 DIAGNOSIS — Z859 Personal history of malignant neoplasm, unspecified: Secondary | ICD-10-CM

## 2012-03-18 DIAGNOSIS — D62 Acute posthemorrhagic anemia: Secondary | ICD-10-CM | POA: Diagnosis not present

## 2012-03-18 DIAGNOSIS — K219 Gastro-esophageal reflux disease without esophagitis: Secondary | ICD-10-CM | POA: Diagnosis present

## 2012-03-18 DIAGNOSIS — G4730 Sleep apnea, unspecified: Secondary | ICD-10-CM | POA: Diagnosis present

## 2012-03-18 DIAGNOSIS — Z96659 Presence of unspecified artificial knee joint: Secondary | ICD-10-CM

## 2012-03-18 DIAGNOSIS — E871 Hypo-osmolality and hyponatremia: Secondary | ICD-10-CM | POA: Diagnosis not present

## 2012-03-18 DIAGNOSIS — M1710 Unilateral primary osteoarthritis, unspecified knee: Principal | ICD-10-CM | POA: Diagnosis present

## 2012-03-18 DIAGNOSIS — J45909 Unspecified asthma, uncomplicated: Secondary | ICD-10-CM | POA: Diagnosis present

## 2012-03-18 HISTORY — PX: TOTAL KNEE ARTHROPLASTY: SHX125

## 2012-03-18 LAB — TYPE AND SCREEN
ABO/RH(D): O POS
Antibody Screen: NEGATIVE

## 2012-03-18 SURGERY — ARTHROPLASTY, KNEE, TOTAL
Anesthesia: Spinal | Site: Knee | Laterality: Left | Wound class: Clean

## 2012-03-18 MED ORDER — TRAMADOL HCL 50 MG PO TABS: 50.0000 mg | ORAL_TABLET | Freq: Four times a day (QID) | ORAL | Status: DC | PRN

## 2012-03-18 MED ORDER — ONDANSETRON HCL 4 MG PO TABS
4.0000 mg | ORAL_TABLET | Freq: Four times a day (QID) | ORAL | Status: DC | PRN
  Administered 2012-03-21: 4 mg via ORAL
  Filled 2012-03-18: qty 1

## 2012-03-18 MED ORDER — SODIUM CHLORIDE 0.9 % IV SOLN: INTRAVENOUS | Status: DC

## 2012-03-18 MED ORDER — HYDROMORPHONE HCL PF 1 MG/ML IJ SOLN
0.2500 mg | INTRAMUSCULAR | Status: DC | PRN
  Administered 2012-03-18: 0.25 mg via INTRAVENOUS

## 2012-03-18 MED ORDER — PANTOPRAZOLE SODIUM 40 MG PO TBEC
80.0000 mg | DELAYED_RELEASE_TABLET | Freq: Every day | ORAL | Status: DC
  Administered 2012-03-19: 40 mg via ORAL
  Filled 2012-03-18 (×3): qty 2

## 2012-03-18 MED ORDER — FLUTICASONE PROPIONATE 50 MCG/ACT NA SUSP
2.0000 | Freq: Every day | NASAL | Status: DC | PRN
  Filled 2012-03-18: qty 16

## 2012-03-18 MED ORDER — METOCLOPRAMIDE HCL 10 MG PO TABS: 5.0000 mg | ORAL_TABLET | Freq: Three times a day (TID) | ORAL | Status: DC | PRN

## 2012-03-18 MED ORDER — ACETAMINOPHEN 10 MG/ML IV SOLN: 1000.0000 mg | Freq: Once | INTRAVENOUS | Status: DC

## 2012-03-18 MED ORDER — PROMETHAZINE HCL 25 MG/ML IJ SOLN: 6.2500 mg | INTRAMUSCULAR | Status: DC | PRN

## 2012-03-18 MED ORDER — TRAZODONE HCL 50 MG PO TABS
50.0000 mg | ORAL_TABLET | Freq: Every day | ORAL | Status: DC
  Administered 2012-03-18 – 2012-03-20 (×3): 50 mg via ORAL
  Filled 2012-03-18 (×4): qty 1

## 2012-03-18 MED ORDER — METOCLOPRAMIDE HCL 5 MG/ML IJ SOLN
5.0000 mg | Freq: Three times a day (TID) | INTRAMUSCULAR | Status: DC | PRN
  Administered 2012-03-19: 10 mg via INTRAVENOUS
  Filled 2012-03-18: qty 2

## 2012-03-18 MED ORDER — ACETAMINOPHEN 10 MG/ML IV SOLN
INTRAVENOUS | Status: DC | PRN
  Administered 2012-03-18: 1000 mg via INTRAVENOUS

## 2012-03-18 MED ORDER — FENOFIBRATE 160 MG PO TABS
160.0000 mg | ORAL_TABLET | Freq: Every day | ORAL | Status: DC
  Administered 2012-03-18 – 2012-03-20 (×3): 160 mg via ORAL
  Filled 2012-03-18 (×4): qty 1

## 2012-03-18 MED ORDER — PHENYLEPHRINE HCL 10 MG/ML IJ SOLN
INTRAMUSCULAR | Status: DC | PRN
  Administered 2012-03-18: 40 ug via INTRAVENOUS
  Administered 2012-03-18 (×6): 80 ug via INTRAVENOUS

## 2012-03-18 MED ORDER — LORATADINE 10 MG PO TABS
10.0000 mg | ORAL_TABLET | Freq: Every day | ORAL | Status: DC
  Administered 2012-03-19 – 2012-03-21 (×3): 10 mg via ORAL
  Filled 2012-03-18 (×3): qty 1

## 2012-03-18 MED ORDER — METOCLOPRAMIDE HCL 5 MG/ML IJ SOLN
INTRAMUSCULAR | Status: DC | PRN
  Administered 2012-03-18 (×2): 5 mg via INTRAVENOUS

## 2012-03-18 MED ORDER — LIDOCAINE HCL (CARDIAC) 20 MG/ML IV SOLN
INTRAVENOUS | Status: DC | PRN
  Administered 2012-03-18: 30 mg via INTRAVENOUS

## 2012-03-18 MED ORDER — METHOCARBAMOL 100 MG/ML IJ SOLN
500.0000 mg | Freq: Four times a day (QID) | INTRAVENOUS | Status: DC | PRN
  Administered 2012-03-18: 500 mg via INTRAVENOUS
  Filled 2012-03-18: qty 5

## 2012-03-18 MED ORDER — ZOLPIDEM TARTRATE 5 MG PO TABS
5.0000 mg | ORAL_TABLET | Freq: Every evening | ORAL | Status: DC | PRN
  Administered 2012-03-18 – 2012-03-19 (×2): 5 mg via ORAL
  Filled 2012-03-18 (×2): qty 1

## 2012-03-18 MED ORDER — BUPIVACAINE ON-Q PAIN PUMP (FOR ORDER SET NO CHG)
INJECTION | Status: DC
  Filled 2012-03-18: qty 1

## 2012-03-18 MED ORDER — MORPHINE SULFATE (PF) 1 MG/ML IV SOLN
INTRAVENOUS | Status: DC
  Administered 2012-03-18: 23 mg via INTRAVENOUS
  Administered 2012-03-18: 15:00:00 via INTRAVENOUS
  Administered 2012-03-18: 8 mg via INTRAVENOUS
  Administered 2012-03-19: 7.86 mg via INTRAVENOUS
  Filled 2012-03-18 (×2): qty 25

## 2012-03-18 MED ORDER — ALBUTEROL SULFATE HFA 108 (90 BASE) MCG/ACT IN AERS
2.0000 | INHALATION_SPRAY | RESPIRATORY_TRACT | Status: DC | PRN
  Filled 2012-03-18: qty 6.7

## 2012-03-18 MED ORDER — SODIUM CHLORIDE 0.9 % IR SOLN
Status: DC | PRN
  Administered 2012-03-18: 3000 mL

## 2012-03-18 MED ORDER — PHENOL 1.4 % MT LIQD
1.0000 | OROMUCOSAL | Status: DC | PRN
  Filled 2012-03-18: qty 177

## 2012-03-18 MED ORDER — EPHEDRINE SULFATE 50 MG/ML IJ SOLN
INTRAMUSCULAR | Status: DC | PRN
  Administered 2012-03-18 (×5): 5 mg via INTRAVENOUS

## 2012-03-18 MED ORDER — CEFAZOLIN SODIUM-DEXTROSE 2-3 GM-% IV SOLR
2.0000 g | INTRAVENOUS | Status: AC
  Administered 2012-03-18: 2 g via INTRAVENOUS

## 2012-03-18 MED ORDER — ACETAMINOPHEN 325 MG PO TABS: 650.0000 mg | ORAL_TABLET | Freq: Four times a day (QID) | ORAL | Status: DC | PRN

## 2012-03-18 MED ORDER — MIDAZOLAM HCL 5 MG/5ML IJ SOLN
INTRAMUSCULAR | Status: DC | PRN
  Administered 2012-03-18 (×2): 1 mg via INTRAVENOUS

## 2012-03-18 MED ORDER — KETAMINE HCL 10 MG/ML IJ SOLN
INTRAMUSCULAR | Status: DC | PRN
  Administered 2012-03-18 (×2): 2.5 mg via INTRAVENOUS
  Administered 2012-03-18: 5 mg via INTRAVENOUS
  Administered 2012-03-18 (×3): 2.5 mg via INTRAVENOUS
  Administered 2012-03-18: 20 mg via INTRAVENOUS
  Administered 2012-03-18 (×5): 2.5 mg via INTRAVENOUS

## 2012-03-18 MED ORDER — DIPHENHYDRAMINE HCL 50 MG/ML IJ SOLN
12.5000 mg | Freq: Four times a day (QID) | INTRAMUSCULAR | Status: DC | PRN
  Filled 2012-03-18: qty 1

## 2012-03-18 MED ORDER — DIPHENHYDRAMINE HCL 12.5 MG/5ML PO ELIX
12.5000 mg | ORAL_SOLUTION | Freq: Four times a day (QID) | ORAL | Status: DC | PRN
  Administered 2012-03-18 – 2012-03-20 (×5): 12.5 mg via ORAL
  Filled 2012-03-18 (×5): qty 5

## 2012-03-18 MED ORDER — 0.9 % SODIUM CHLORIDE (POUR BTL) OPTIME
TOPICAL | Status: DC | PRN
  Administered 2012-03-18: 1000 mL

## 2012-03-18 MED ORDER — SODIUM CHLORIDE 0.9 % IV SOLN
INTRAVENOUS | Status: DC
  Administered 2012-03-18: 15:00:00 via INTRAVENOUS
  Administered 2012-03-19: 10 mL/h via INTRAVENOUS
  Administered 2012-03-19: 05:00:00 via INTRAVENOUS
  Administered 2012-03-19: 20 mL/h via INTRAVENOUS

## 2012-03-18 MED ORDER — SODIUM CHLORIDE 0.9 % IJ SOLN: 9.0000 mL | INTRAMUSCULAR | Status: DC | PRN

## 2012-03-18 MED ORDER — METHYLPREDNISOLONE ACETATE 40 MG/ML IJ SUSP
INTRAMUSCULAR | Status: AC
  Filled 2012-03-18: qty 10

## 2012-03-18 MED ORDER — PROPOFOL 10 MG/ML IV EMUL
INTRAVENOUS | Status: DC | PRN
  Administered 2012-03-18: 100 ug/kg/min via INTRAVENOUS

## 2012-03-18 MED ORDER — AMLODIPINE BESYLATE 5 MG PO TABS
5.0000 mg | ORAL_TABLET | Freq: Every day | ORAL | Status: DC
Start: 2012-03-19 — End: 2012-03-21
  Administered 2012-03-20 – 2012-03-21 (×2): 5 mg via ORAL
  Filled 2012-03-18 (×4): qty 1

## 2012-03-18 MED ORDER — METHOCARBAMOL 500 MG PO TABS
500.0000 mg | ORAL_TABLET | Freq: Four times a day (QID) | ORAL | Status: DC | PRN
  Administered 2012-03-19 – 2012-03-21 (×8): 500 mg via ORAL
  Filled 2012-03-18 (×9): qty 1

## 2012-03-18 MED ORDER — LACTATED RINGERS IV SOLN
INTRAVENOUS | Status: DC | PRN
  Administered 2012-03-18 (×2): via INTRAVENOUS

## 2012-03-18 MED ORDER — CEFAZOLIN SODIUM 1-5 GM-% IV SOLN
1.0000 g | Freq: Four times a day (QID) | INTRAVENOUS | Status: AC
  Administered 2012-03-18 (×2): 1 g via INTRAVENOUS
  Filled 2012-03-18 (×3): qty 50

## 2012-03-18 MED ORDER — ACETAMINOPHEN 10 MG/ML IV SOLN
INTRAVENOUS | Status: AC
  Filled 2012-03-18: qty 100

## 2012-03-18 MED ORDER — DEXAMETHASONE SODIUM PHOSPHATE 10 MG/ML IJ SOLN
INTRAMUSCULAR | Status: DC | PRN
  Administered 2012-03-18: 10 mg via INTRAVENOUS

## 2012-03-18 MED ORDER — ONDANSETRON HCL 4 MG/2ML IJ SOLN: 4.0000 mg | Freq: Four times a day (QID) | INTRAMUSCULAR | Status: DC | PRN

## 2012-03-18 MED ORDER — HETASTARCH-ELECTROLYTES 6 % IV SOLN
INTRAVENOUS | Status: DC | PRN
  Administered 2012-03-18: 12:00:00 via INTRAVENOUS

## 2012-03-18 MED ORDER — OXYCODONE HCL 5 MG PO TABS
5.0000 mg | ORAL_TABLET | ORAL | Status: DC | PRN
  Administered 2012-03-18 – 2012-03-21 (×18): 10 mg via ORAL
  Filled 2012-03-18 (×18): qty 2

## 2012-03-18 MED ORDER — NALOXONE HCL 0.4 MG/ML IJ SOLN: 0.4000 mg | INTRAMUSCULAR | Status: DC | PRN

## 2012-03-18 MED ORDER — ATORVASTATIN CALCIUM 40 MG PO TABS
40.0000 mg | ORAL_TABLET | Freq: Every day | ORAL | Status: DC
  Administered 2012-03-18 – 2012-03-20 (×3): 40 mg via ORAL
  Filled 2012-03-18 (×4): qty 1

## 2012-03-18 MED ORDER — ACETAMINOPHEN 650 MG RE SUPP: 650.0000 mg | Freq: Four times a day (QID) | RECTAL | Status: DC | PRN

## 2012-03-18 MED ORDER — BUPIVACAINE 0.25 % ON-Q PUMP SINGLE CATH 300ML
INJECTION | Status: DC | PRN
  Administered 2012-03-18: 300 mL

## 2012-03-18 MED ORDER — ONDANSETRON HCL 4 MG/2ML IJ SOLN
INTRAMUSCULAR | Status: DC | PRN
  Administered 2012-03-18: 4 mg via INTRAVENOUS

## 2012-03-18 MED ORDER — ONDANSETRON HCL 4 MG/2ML IJ SOLN
4.0000 mg | Freq: Four times a day (QID) | INTRAMUSCULAR | Status: DC | PRN
  Administered 2012-03-19 (×2): 4 mg via INTRAVENOUS
  Filled 2012-03-18 (×2): qty 2

## 2012-03-18 MED ORDER — HYDROMORPHONE HCL PF 1 MG/ML IJ SOLN
INTRAMUSCULAR | Status: AC
  Filled 2012-03-18: qty 1

## 2012-03-18 MED ORDER — POLYETHYLENE GLYCOL 3350 17 G PO PACK: 17.0000 g | PACK | Freq: Every day | ORAL | Status: DC | PRN

## 2012-03-18 MED ORDER — RIVAROXABAN 10 MG PO TABS
10.0000 mg | ORAL_TABLET | Freq: Every day | ORAL | Status: DC
  Administered 2012-03-19 – 2012-03-21 (×3): 10 mg via ORAL
  Filled 2012-03-18 (×4): qty 1

## 2012-03-18 MED ORDER — BUPIVACAINE 0.25 % ON-Q PUMP SINGLE CATH 300ML
INJECTION | Status: AC
  Filled 2012-03-18: qty 300

## 2012-03-18 MED ORDER — FLUTICASONE-SALMETEROL 100-50 MCG/DOSE IN AEPB
1.0000 | INHALATION_SPRAY | Freq: Two times a day (BID) | RESPIRATORY_TRACT | Status: DC
  Administered 2012-03-20: 1 via RESPIRATORY_TRACT
  Filled 2012-03-18: qty 14

## 2012-03-18 MED ORDER — MORPHINE SULFATE (PF) 1 MG/ML IV SOLN
INTRAVENOUS | Status: AC
  Administered 2012-03-18: 23 mg via INTRAVENOUS
  Filled 2012-03-18: qty 25

## 2012-03-18 MED ORDER — CEFAZOLIN SODIUM-DEXTROSE 2-3 GM-% IV SOLR
INTRAVENOUS | Status: AC
  Filled 2012-03-18: qty 50

## 2012-03-18 MED ORDER — POLYVINYL ALCOHOL 1.4 % OP SOLN
1.0000 [drp] | Freq: Three times a day (TID) | OPHTHALMIC | Status: DC | PRN
  Filled 2012-03-18: qty 15

## 2012-03-18 MED ORDER — DIPHENHYDRAMINE HCL 12.5 MG/5ML PO ELIX: 12.5000 mg | ORAL_SOLUTION | ORAL | Status: DC | PRN

## 2012-03-18 MED ORDER — DOCUSATE SODIUM 100 MG PO CAPS
100.0000 mg | ORAL_CAPSULE | Freq: Two times a day (BID) | ORAL | Status: DC
  Administered 2012-03-18 – 2012-03-21 (×6): 100 mg via ORAL

## 2012-03-18 MED ORDER — MONTELUKAST SODIUM 10 MG PO TABS
10.0000 mg | ORAL_TABLET | Freq: Every day | ORAL | Status: DC
Start: 2012-03-18 — End: 2012-03-21
  Administered 2012-03-18 – 2012-03-20 (×3): 10 mg via ORAL
  Filled 2012-03-18 (×4): qty 1

## 2012-03-18 MED ORDER — ESOMEPRAZOLE MAGNESIUM 40 MG PO PACK: 40.0000 mg | PACK | Freq: Every day | ORAL | Status: DC

## 2012-03-18 MED ORDER — BISACODYL 10 MG RE SUPP: 10.0000 mg | Freq: Every day | RECTAL | Status: DC | PRN

## 2012-03-18 MED ORDER — METHYLPREDNISOLONE ACETATE 40 MG/ML IJ SUSP
INTRAMUSCULAR | Status: DC | PRN
  Administered 2012-03-18 (×2): 40 mg via INTRAMUSCULAR

## 2012-03-18 MED ORDER — MENTHOL 3 MG MT LOZG
1.0000 | LOZENGE | OROMUCOSAL | Status: DC | PRN
  Administered 2012-03-18: 3 mg via ORAL
  Filled 2012-03-18: qty 9

## 2012-03-18 MED ORDER — FENTANYL CITRATE 0.05 MG/ML IJ SOLN
INTRAMUSCULAR | Status: DC | PRN
  Administered 2012-03-18 (×5): 12.5 ug via INTRAVENOUS
  Administered 2012-03-18: 25 ug via INTRAVENOUS
  Administered 2012-03-18: 12.5 ug via INTRAVENOUS

## 2012-03-18 SURGICAL SUPPLY — 57 items
BAG SPEC THK2 15X12 ZIP CLS (MISCELLANEOUS) ×1
BAG ZIPLOCK 12X15 (MISCELLANEOUS) ×2 IMPLANT
BANDAGE ADHESIVE 1X3 (GAUZE/BANDAGES/DRESSINGS) ×2 IMPLANT
BANDAGE ELASTIC 6 VELCRO ST LF (GAUZE/BANDAGES/DRESSINGS) ×2 IMPLANT
BANDAGE ESMARK 6X9 LF (GAUZE/BANDAGES/DRESSINGS) ×1 IMPLANT
BLADE SAG 18X100X1.27 (BLADE) ×2 IMPLANT
BLADE SAW SGTL 11.0X1.19X90.0M (BLADE) ×2 IMPLANT
BNDG CMPR 9X6 STRL LF SNTH (GAUZE/BANDAGES/DRESSINGS) ×1
BNDG ESMARK 6X9 LF (GAUZE/BANDAGES/DRESSINGS) ×2
BOWL SMART MIX CTS (DISPOSABLE) ×2 IMPLANT
CATH KIT ON-Q SILVERSOAK 5 (CATHETERS) ×1 IMPLANT
CATH KIT ON-Q SILVERSOAK 5IN (CATHETERS) ×2 IMPLANT
CEMENT HV SMART SET (Cement) ×2 IMPLANT
CLOTH BEACON ORANGE TIMEOUT ST (SAFETY) ×2 IMPLANT
CLSR STERI-STRIP ANTIMIC 1/2X4 (GAUZE/BANDAGES/DRESSINGS) ×2 IMPLANT
CUFF TOURN SGL QUICK 34 (TOURNIQUET CUFF) ×2
CUFF TRNQT CYL 34X4X40X1 (TOURNIQUET CUFF) ×1 IMPLANT
DRAPE EXTREMITY T 121X128X90 (DRAPE) ×2 IMPLANT
DRAPE POUCH INSTRU U-SHP 10X18 (DRAPES) ×2 IMPLANT
DRAPE U-SHAPE 47X51 STRL (DRAPES) ×2 IMPLANT
DRSG ADAPTIC 3X8 NADH LF (GAUZE/BANDAGES/DRESSINGS) ×2 IMPLANT
DRSG PAD ABDOMINAL 8X10 ST (GAUZE/BANDAGES/DRESSINGS) ×2 IMPLANT
DRSG TEGADERM 4X4.75 (GAUZE/BANDAGES/DRESSINGS) ×1 IMPLANT
DURAPREP 26ML APPLICATOR (WOUND CARE) ×2 IMPLANT
ELECT REM PT RETURN 9FT ADLT (ELECTROSURGICAL) ×2
ELECTRODE REM PT RTRN 9FT ADLT (ELECTROSURGICAL) ×1 IMPLANT
EVACUATOR 1/8 PVC DRAIN (DRAIN) ×2 IMPLANT
FACESHIELD LNG OPTICON STERILE (SAFETY) ×10 IMPLANT
GLOVE BIO SURGEON STRL SZ7.5 (GLOVE) ×2 IMPLANT
GLOVE BIO SURGEON STRL SZ8 (GLOVE) ×2 IMPLANT
GLOVE BIOGEL PI IND STRL 8 (GLOVE) ×2 IMPLANT
GLOVE BIOGEL PI INDICATOR 8 (GLOVE) ×2
GOWN STRL NON-REIN LRG LVL3 (GOWN DISPOSABLE) ×2 IMPLANT
GOWN STRL REIN XL XLG (GOWN DISPOSABLE) ×2 IMPLANT
HANDPIECE INTERPULSE COAX TIP (DISPOSABLE) ×2
IMMOBILIZER KNEE 20 (SOFTGOODS) ×2
IMMOBILIZER KNEE 20 THIGH 36 (SOFTGOODS) ×1 IMPLANT
KIT BASIN OR (CUSTOM PROCEDURE TRAY) ×2 IMPLANT
MANIFOLD NEPTUNE II (INSTRUMENTS) ×2 IMPLANT
NS IRRIG 1000ML POUR BTL (IV SOLUTION) ×2 IMPLANT
PACK TOTAL JOINT (CUSTOM PROCEDURE TRAY) ×2 IMPLANT
PAD ABD 7.5X8 STRL (GAUZE/BANDAGES/DRESSINGS) ×2 IMPLANT
PADDING CAST COTTON 6X4 STRL (CAST SUPPLIES) ×4 IMPLANT
POSITIONER SURGICAL ARM (MISCELLANEOUS) ×2 IMPLANT
SET HNDPC FAN SPRY TIP SCT (DISPOSABLE) ×1 IMPLANT
SPONGE GAUZE 4X4 12PLY (GAUZE/BANDAGES/DRESSINGS) ×2 IMPLANT
STRIP CLOSURE SKIN 1/2X4 (GAUZE/BANDAGES/DRESSINGS) ×4 IMPLANT
SUCTION FRAZIER 12FR DISP (SUCTIONS) ×2 IMPLANT
SUT MNCRL AB 4-0 PS2 18 (SUTURE) ×2 IMPLANT
SUT PDS AB 1 CT1 27 (SUTURE) ×6 IMPLANT
SUT VIC AB 2-0 CT1 27 (SUTURE) ×6
SUT VIC AB 2-0 CT1 TAPERPNT 27 (SUTURE) ×3 IMPLANT
SUT VLOC 180 0 24IN GS25 (SUTURE) ×2 IMPLANT
TOWEL OR 17X26 10 PK STRL BLUE (TOWEL DISPOSABLE) ×4 IMPLANT
TRAY FOLEY CATH 14FRSI W/METER (CATHETERS) ×2 IMPLANT
WATER STERILE IRR 1500ML POUR (IV SOLUTION) ×2 IMPLANT
WRAP KNEE MAXI GEL POST OP (GAUZE/BANDAGES/DRESSINGS) ×4 IMPLANT

## 2012-03-18 NOTE — Anesthesia Postprocedure Evaluation (Signed)
  Anesthesia Post-op Note  Patient: [name]  Procedure(s) Performed: Procedure(s) (LRB): TOTAL KNEE ARTHROPLASTY (Left)  Patient Location: PACU  Anesthesia Type: Spinal  Level of Consciousness: awake and alert   Airway and Oxygen Therapy: Patient Spontanous Breathing  Post-op Pain: mild  Post-op Assessment: Post-op Vital signs reviewed, Patient's Cardiovascular Status Stable, Respiratory Function Stable, Patent Airway and No signs of Nausea or vomiting  Post-op Vital Signs: stable  Complications: No apparent anesthesia complications

## 2012-03-18 NOTE — H&P (View-Only) (Signed)
[name] 03/12/2012 3:50 PM Location: [facility] Patient #: [id number] DOB: 10/01/1950 Married / Language: English / Race: White Female   History of Present Illness The patient is a 60 year old female who comes in for a preoperative History and Physical. The patient is scheduled for a left total knee arthroplasty to be performed by Dr. Frank V. Aluisio, MD at [facility] on 03/18/2012. The patient is being followed for their left knee pain and osteoarthritis. Symptoms reported today include: pain (that is getting progressively worse) and swelling, while the patient does not report symptoms of: instability. The following medication has been used for pain control: none. The left knee is becoming more problematic. This is starting to limit her more. She is getting increased pain. She does not have swelling. This occasionally wants to give out on her. She is ready for surgery. They have been treated conservatively in the past for the above stated problem and despite conservative measures, they continue to have progressive pain and severe functional limitations and dysfunction. They have failed non-operative management. It is felt that they would benefit from undergoing total joint replacement. Risks and benefits of the procedure have been discussed with the patient and they elect to proceed with surgery. There are no active contraindications to surgery such as ongoing infection or rapidly progressive neurological disease.   Problem List/Past Medical Osteoarthritis, Knee (715.96) Cancer, significant illness PVC's occasionally -- formerly took Nadalol -- now unmedicated Sleep Apnea Hypercholesterolemia Asthma Gastroesophageal Reflux Disease High blood pressure Menopause  Allergies No Known Drug Allergies  Family History Kidney disease. child Hypertension. father Severe allergy. mother and sister Osteoarthritis. mother and sister Congestive Heart Failure.  mother Cancer. father and grandmother fathers side Heart disease in female family member before age 55 Heart Disease. father  Social History Number of flights of stairs before winded. 2-3 Most recent primary occupation. former teacher Marital status. married Pain Contract. no Tobacco use. never smoker Tobacco / smoke exposure. no Previously in rehab. no Current work status. retired Children. 1 Alcohol use. current drinker; drinks wine and hard liquor; only occasionally per week Drug/Alcohol Rehab (Currently). no Living situation. live with spouse Illicit drug use. no Exercise. Exercises daily; does running / walking, other and gym / weights  Medication History Advair Diskus (100-50MCG/DOSE Aero Pow Br Act, Inhalation) Active. AmLODIPine Besylate (5MG Tablet, Oral) Active. Atorvastatin Calcium (40MG Tablet, Oral) Active. Fenofibrate Micronized (200MG Capsule, Oral) Active. CombiPatch (0.05-0.25MG/DAY Patch Biweekly, Transdermal) Active. Lunesta (3MG Tablet, Oral) Active. Nasonex (50MCG/ACT Suspension, Nasal) Active. NexIUM (40MG Capsule DR, Oral) Active. TraZODone HCl (50MG Tablet, Oral) Active. Ventolin HFA (108 (90 Base)MCG/ACT Aerosol Soln, Inhalation) Active. Vivelle-Dot (0.0375MG/24HR Patch Biweekly, Transdermal) Active. Ibandronate Sodium (150MG Tablet, Oral) Active. Multiple Vitamin (1 Oral) Active. Calcium Citrate (1 Oral) Specific dose unknown - Active. Vitamin D (1 Oral) Specific dose unknown - Active. Magnesium Chloride ( Oral) Specific dose unknown - Active.  Past Surgical History Mammoplasty; Reduction. bilateral Sinus Surgery Total Hip Replacement. bilateral Cesarean Delivery. 1 time Colon Polyp Removal - Colonoscopy Foot Surgery. right  Review of Systems General:Not Present- Chills, Fever, Night Sweats, Fatigue, Weight Gain, Weight Loss and Memory Loss. Skin:Not Present- Hives, Itching, Rash, Eczema and Lesions. HEENT:Not  Present- Tinnitus, Headache, Double Vision, Visual Loss, Hearing Loss and Dentures. Respiratory:Not Present- Shortness of breath with exertion, Shortness of breath at rest, Allergies, Coughing up blood and Chronic Cough. Cardiovascular:Not Present- Chest Pain, Racing/skipping heartbeats, Difficulty Breathing Lying Down, Murmur, Swelling and Palpitations. Gastrointestinal:Not Present- Bloody Stool,   Heartburn, Abdominal Pain, Vomiting, Nausea, Constipation, Diarrhea, Difficulty Swallowing, Jaundice and Loss of appetitie. Female Genitourinary:Not Present- Blood in Urine, Urinary frequency, Weak urinary stream, Discharge, Flank Pain, Incontinence, Painful Urination, Urgency, Urinary Retention and Urinating at Night. Musculoskeletal:Not Present- Muscle Weakness, Muscle Pain, Joint Swelling, Joint Pain, Back Pain, Morning Stiffness and Spasms. Neurological:Not Present- Tremor, Dizziness, Blackout spells, Paralysis, Difficulty with balance and Weakness. Psychiatric:Not Present- Insomnia.   Vitals Weight: 174 lb Height: 65 in Body Surface Area: 1.9 m Body Mass Index: 28.95 kg/m Pulse: 72 (Regular) Resp.: 14 (Unlabored) BP: 108/72 (Sitting, Right Arm, Standard)    Physical Exam(DREW L PERKINS, PA-C; 03/12/2012 9:32 PM) The physical exam findings are as follows:  Patient is a 60 year old female with continued knee pain. Patient is accompanied today by her husband.   General Mental Status - Alert, cooperative and good historian. General Appearance- pleasant. Not in acute distress. Orientation- Oriented X3. Build & Nutrition- Well nourished and Well developed.   Head and Neck Head- normocephalic, atraumatic . Neck Global Assessment- supple. no bruit auscultated on the right and no bruit auscultated on the left.   Eye Pupil- Bilateral- Regular and Round. Motion- Bilateral- EOMI.   Chest and Lung Exam Auscultation: Breath sounds:- clear at anterior  chest wall and - clear at posterior chest wall. Adventitious sounds:- No Adventitious sounds.   Cardiovascular Auscultation:Rhythm- Regular rate and rhythm. Heart Sounds- S1 WNL and S2 WNL. Murmurs & Other Heart Sounds:Auscultation of the heart reveals - No Murmurs.   Abdomen Palpation/Percussion:Tenderness- Abdomen is non-tender to palpation. Rigidity (guarding)- Abdomen is soft. Auscultation:Auscultation of the abdomen reveals - Bowel sounds normal.   Female Genitourinary Not done, not pertinent to present illness  Musculoskeletal She is alert and oriented. No apparent distress. Both hips can be flexed to 120, rotated in 30, out 40 and abducted to 40 without discomfort. The left knee shows no effusion. She has a varus deformity. There is a marked crepitus on range of motion of the knee. She is tender medially. No lateral tenderness or any instability noted.  RADIOGRAPHS: AP pelvis, AP and lateral of both hips show both prosthesis to be in excellent position with no periprosthetic abnormalities. AP of both knees and lateral of the left show bone on bone arthritis of the medial compartment and patellofemoral compartments of the left knee. She has a varus deformity with large osteophytes. AP of the right knee shows narrowing of the medial joint line.  Assessment & Plan Osteoarthritis,  Left Knee greater than Right Knee   Note: Patient is for Left Total Knee Replacement and a right knee cortisone injection by Dr. Aluisio.  Plan is to go home.  PCP - Dr. Wendy McNeil Pulm - Dr. Clint Young  Signed electronically by DREW L PERKINS, PA-C  

## 2012-03-18 NOTE — Interval H&P Note (Signed)
History and Physical Interval Note:  03/18/2012 11:12 AM  [name]  has presented today for surgery, with the diagnosis of osteoarthritis of the left knee  The various methods of treatment have been discussed with the patient and family. After consideration of risks, benefits and other options for treatment, the patient has consented to  Procedure(s) (LRB): TOTAL KNEE ARTHROPLASTY (Left) as a surgical intervention .  The patient's history has been reviewed, patient examined, no change in status, stable for surgery.  I have reviewed the patients' chart and labs.  Questions were answered to the patient's satisfaction.     Loanne Drilling

## 2012-03-18 NOTE — Preoperative (Signed)
Beta Blockers   Reason not to administer Beta Blockers:Not Applicable, not on home BB 

## 2012-03-18 NOTE — Progress Notes (Signed)
Utilization review completed.  

## 2012-03-18 NOTE — Anesthesia Procedure Notes (Signed)
Spinal Patient location during procedure: OR Staffing Performed by: anesthesiologist  Preanesthetic Checklist Completed: patient identified, site marked, surgical consent, pre-op evaluation, timeout performed, IV checked, risks and benefits discussed and monitors and equipment checked Spinal Block Patient position: sitting Prep: Betadine Patient monitoring: heart rate, continuous pulse ox and blood pressure Injection technique: single-shot Needle Needle type: Spinocan  Needle gauge: 24 G Needle length: 9 cm Additional Notes Expiration date of kit checked and confirmed. Patient tolerated procedure well, without complications.     

## 2012-03-18 NOTE — Transfer of Care (Signed)
Immediate Anesthesia Transfer of Care Note  Patient: [name]  Procedure(s) Performed: Procedure(s) (LRB): TOTAL KNEE ARTHROPLASTY (Left)  Patient Location: PACU  Anesthesia Type: MAC and Spinal  Level of Consciousness: awake, sedated and patient cooperative  Airway & Oxygen Therapy: Patient Spontanous Breathing and Patient connected to face mask oxygen  Post-op Assessment: Report given to PACU RN and Post -op Vital signs reviewed and stable  Post vital signs: Reviewed and stable  Complications: No apparent anesthesia complications

## 2012-03-18 NOTE — Plan of Care (Signed)
Problem: Consults Goal: Diagnosis- Total Joint Replacement Right total knee     

## 2012-03-18 NOTE — Anesthesia Preprocedure Evaluation (Signed)
Anesthesia Evaluation  Patient identified by MRN, date of birth, ID band Patient awake    Reviewed: Allergy & Precautions, H&P , NPO status , Patient's Chart, lab work & pertinent test results  Airway Mallampati: II TM Distance: <3 FB Neck ROM: Full    Dental No notable dental hx.    Pulmonary asthma ,  breath sounds clear to auscultation  Pulmonary exam normal       Cardiovascular hypertension, Pt. on medications negative cardio ROS  Rhythm:Regular Rate:Normal     Neuro/Psych negative neurological ROS  negative psych ROS   GI/Hepatic Neg liver ROS, GERD-  Medicated,  Endo/Other  negative endocrine ROS  Renal/GU negative Renal ROS  negative genitourinary   Musculoskeletal negative musculoskeletal ROS (+)   Abdominal   Peds negative pediatric ROS (+)  Hematology negative hematology ROS (+)   Anesthesia Other Findings   Reproductive/Obstetrics negative OB ROS                           Anesthesia Physical Anesthesia Plan  ASA: II  Anesthesia Plan: Spinal   Post-op Pain Management:    Induction:   Airway Management Planned: Simple Face Mask  Additional Equipment:   Intra-op Plan:   Post-operative Plan:   Informed Consent: I have reviewed the patients History and Physical, chart, labs and discussed the procedure including the risks, benefits and alternatives for the proposed anesthesia with the patient or authorized representative who has indicated his/her understanding and acceptance.   Dental advisory given  Plan Discussed with: CRNA  Anesthesia Plan Comments:         Anesthesia Quick Evaluation

## 2012-03-18 NOTE — Op Note (Signed)
Pre-operative diagnosis- Osteoarthritis  Bilateral knee(s)  Post-operative diagnosis- Osteoarthritis Bilateral knee(s)  Procedure-  Left  Total Knee Arthroplasty   Right knee cortisone injection  Surgeon- Gus Rankin. Magon Croson, MD  Assistant- Avel Peace, PA-C   Anesthesia-  Spinal EBL-* No blood loss amount entered *  Drains Hemovac  Tourniquet time- 39 minutes @ 300 mm Hg Complications- None  Condition-PACU - hemodynamically stable.   Brief Clinical Note  [name] is a 61 y.o. year old female with end stage OA of her left knee with progressively worsening pain and dysfunction. She has constant pain, with activity and at rest and significant functional deficits with difficulties even with ADLs. She has had extensive non-op management including analgesics, injections of cortisone and viscosupplements, and home exercise program, but remains in significant pain with significant dysfunction. Radiographs show bone on bone arthritis all 3 compartments. She presents now for left Total Knee Arthroplasty.  She also has significant symptomatic arthritis of her right knee and requests cortisone injection.  Procedure in detail---   The patient is brought into the operating room and positioned supine on the operating table. After successful administration of  Spinal,   a tourniquet is placed high on the  Left thigh(s) and the lower extremity is prepped and draped in the usual sterile fashion. Time out is performed by the operating team and then the  Left lower extremity is wrapped in Esmarch, knee flexed and the tourniquet inflated to 300 mmHg.       A midline incision is made with a ten blade through the subcutaneous tissue to the level of the extensor mechanism. A fresh blade is used to make a medial parapatellar arthrotomy. Soft tissue over the proximal medial tibia is subperiosteally elevated to the joint line with a knife and into the semimembranosus bursa with a Cobb elevator. Soft tissue over  the proximal lateral tibia is elevated with attention being paid to avoiding the patellar tendon on the tibial tubercle. The patella is everted, knee flexed 90 degrees and the ACL and PCL are removed. Findings are bone on bone all 3 compartments with massive osteophyte formation.        The drill is used to create a starting hole in the distal femur and the canal is thoroughly irrigated with sterile saline to remove the fatty contents. The 5 degree Left  valgus alignment guide is placed into the femoral canal and the distal femoral cutting block is pinned to remove 10 mm off the distal femur. Resection is made with an oscillating saw.      The tibia is subluxed forward and the menisci are removed. The extramedullary alignment guide is placed referencing proximally at the medial aspect of the tibial tubercle and distally along the second metatarsal axis and tibial crest. The block is pinned to remove 2mm off the more deficient medial  side. Resection is made with an oscillating saw. Size 4is the most appropriate size for the tibia and the proximal tibia is prepared with the modular drill and keel punch for that size.      The femoral sizing guide is placed and size 4 narrow is most appropriate. Rotation is marked off the epicondylar axis and confirmed by creating a rectangular flexion gap at 90 degrees. The size 4 cutting block is pinned in this rotation and the anterior, posterior and chamfer cuts are made with the oscillating saw. The intercondylar block is then placed and that cut is made.      Trial  size 4 tibial component, trial size 4 posterior stabilized femur and a 12.5  mm posterior stabilized rotating platform insert trial is placed. Full extension is achieved with excellent varus/valgus and anterior/posterior balance throughout full range of motion. The patella is everted and thickness measured to be 24  mm. Free hand resection is taken to 14 mm, a 38 template is placed, lug holes are drilled, trial  patella is placed, and it tracks normally. Osteophytes are removed off the posterior femur with the trial in place. All trials are removed and the cut bone surfaces prepared with pulsatile lavage. Cement is mixed and once ready for implantation, the size 4 tibial implant, size  4 posterior stabilized femoral component, and the size 38 patella are cemented in place and the patella is held with the clamp. The trial insert is placed and the knee held in full extension. All extruded cement is removed and once the cement is hard the permanent 12.5 mm posterior stabilized rotating platform insert is placed into the tibial tray.      The wound is copiously irrigated with saline solution and the extensor mechanism closed over a hemovac drain with #1 PDS suture. The tourniquet is released for a total tourniquet time of 39  minutes. Flexion against gravity is 140 degrees and the patella tracks normally. Subcutaneous tissue is closed with 2.0 vicryl and subcuticular with running 4.0 Monocryl. The catheter for the Marcaine pain pump is placed and the pump is initiated. The incision is cleaned and dried and steri-strips and a bulky sterile dressing are applied. I then injected the right knee with 80 mg of Depomedrol after a sterile prep with betadine.The left leg is placed into a knee immobilizer and the patient is awakened and transported to recovery in stable condition.      Please note that a surgical assistant was a medical necessity for this procedure in order to perform it in a safe and expeditious manner. Surgical assistant was necessary to retract the ligaments and vital neurovascular structures to prevent injury to them and also necessary for proper positioning of the limb to allow for anatomic placement of the prosthesis.   Gus Rankin Plato Alspaugh, MD    03/18/2012, 12:34 PM

## 2012-03-19 ENCOUNTER — Encounter (HOSPITAL_COMMUNITY): Payer: Self-pay | Admitting: Orthopedic Surgery

## 2012-03-19 LAB — CBC
Hemoglobin: 9.1 g/dL — ABNORMAL LOW (ref 12.0–15.0)
MCH: 24.7 pg — ABNORMAL LOW (ref 26.0–34.0)
MCV: 76.9 fL — ABNORMAL LOW (ref 78.0–100.0)
RBC: 3.68 MIL/uL — ABNORMAL LOW (ref 3.87–5.11)

## 2012-03-19 LAB — BASIC METABOLIC PANEL
CO2: 25 mEq/L (ref 19–32)
Calcium: 8.3 mg/dL — ABNORMAL LOW (ref 8.4–10.5)
Creatinine, Ser: 1.04 mg/dL (ref 0.50–1.10)
Glucose, Bld: 146 mg/dL — ABNORMAL HIGH (ref 70–99)
Sodium: 131 mEq/L — ABNORMAL LOW (ref 135–145)

## 2012-03-19 MED ORDER — MORPHINE SULFATE 2 MG/ML IJ SOLN: 1.0000 mg | INTRAMUSCULAR | Status: DC | PRN

## 2012-03-19 MED ORDER — ESOMEPRAZOLE MAGNESIUM 40 MG PO CPDR
40.0000 mg | DELAYED_RELEASE_CAPSULE | Freq: Every day | ORAL | Status: DC
  Filled 2012-03-19 (×2): qty 1

## 2012-03-19 MED ORDER — GI COCKTAIL ~~LOC~~
30.0000 mL | Freq: Once | ORAL | Status: AC
  Administered 2012-03-19: 30 mL via ORAL
  Filled 2012-03-19: qty 30

## 2012-03-19 MED ORDER — NON FORMULARY: 40.0000 mg | Freq: Every day | Status: DC

## 2012-03-19 MED ORDER — ALUM & MAG HYDROXIDE-SIMETH 200-200-20 MG/5ML PO SUSP: 30.0000 mL | Freq: Four times a day (QID) | ORAL | Status: DC | PRN

## 2012-03-19 NOTE — Progress Notes (Signed)
Physical Therapy Treatment Patient Details Name: [name] MRN: [id number] DOB: Mar 24, 1951 Today's Date: 03/19/2012 Time: 4098-1191 PT Time Calculation (min): 45 min  PT Assessment / Plan / Recommendation Comments on Treatment Session  Pt progressing well with ambulation and exercises.  Pt states she has some spasms in her left hip that happens unexpectedly and causes pain and swelling.  Spoke with pt about bursitis vs muscle spams, etc and that she can mention it to PA/MD for futher questioning.     Follow Up Recommendations  Home health PT    Barriers to Discharge        Equipment Recommendations  Rolling walker with 5" wheels    Recommendations for Other Services OT consult  Frequency 7X/week   Plan Discharge plan remains appropriate    Precautions / Restrictions Precautions Precautions: Knee Required Braces or Orthoses: Knee Immobilizer - Left Knee Immobilizer - Left: Discontinue once straight leg raise with < 10 degree lag Restrictions Weight Bearing Restrictions: No Other Position/Activity Restrictions: WBAT   Pertinent Vitals/Pain 6/10    Mobility  Bed Mobility Bed Mobility: Supine to Sit;Sit to Supine Supine to Sit: 4: Min guard Sit to Supine: 4: Min guard Details for Bed Mobility Assistance: Min/guard for safety of LLE into and out of bed with min cues for technique.  Transfers Transfers: Sit to Stand;Stand to Sit Sit to Stand: 4: Min guard;From elevated surface;With upper extremity assist;From bed Stand to Sit: With upper extremity assist;4: Min guard;To bed Details for Transfer Assistance: Min/guard for safety with cues for hand placement and LE management.  Ambulation/Gait Ambulation/Gait Assistance: 4: Min guard Ambulation Distance (Feet): 50 Feet Assistive device: Rolling walker Ambulation/Gait Assistance Details: Cues for sequencing/technique with RW, upright posture and for not stepping too far inside of RW.  Gait Pattern: Step-to  pattern;Decreased stance time - left;Decreased step length - right Gait velocity: decreased    Exercises Total Joint Exercises Ankle Circles/Pumps: AROM;Both;20 reps Quad Sets: AROM;Strengthening;Left;10 reps Heel Slides: AAROM;Left;10 reps Hip ABduction/ADduction: AAROM;Left;10 reps Straight Leg Raises: AAROM;Left;10 reps   PT Diagnosis:    PT Problem List:   PT Treatment Interventions:     PT Goals Acute Rehab PT Goals PT Goal Formulation: With patient Time For Goal Achievement: 03/22/12 Potential to Achieve Goals: Good Pt will go Sit to Supine/Side: with supervision PT Goal: Sit to Supine/Side - Progress: Progressing toward goal Pt will go Sit to Stand: with supervision PT Goal: Sit to Stand - Progress: Progressing toward goal Pt will go Stand to Sit: with modified independence PT Goal: Stand to Sit - Progress: Progressing toward goal Pt will Ambulate: 51 - 150 feet;with supervision;with least restrictive assistive device PT Goal: Ambulate - Progress: Progressing toward goal  Visit Information  Last PT Received On: 03/19/12 Assistance Needed: +1    Subjective Data  Subjective: I get spasms in my hip Patient Stated Goal: to get home   Cognition  Overall Cognitive Status: Appears within functional limits for tasks assessed/performed Arousal/Alertness: Awake/alert Orientation Level: Appears intact for tasks assessed Behavior During Session: [facility] for tasks performed    Balance     End of Session PT - End of Session Equipment Utilized During Treatment: Left knee immobilizer Activity Tolerance: Patient tolerated treatment well Patient left: in bed;with call bell/phone within reach;with family/visitor present Nurse Communication: Mobility status CPM Left Knee CPM Left Knee: On   GP     Page, Meribeth Mattes 03/19/2012, 4:52 PM

## 2012-03-19 NOTE — Evaluation (Signed)
Physical Therapy Evaluation Patient Details Name: [name] MRN: [id number] DOB: 1950/11/22 Today's Date: 03/19/2012 Time: 0936-1000 PT Time Calculation (min): 24 min  PT Assessment / Plan / Recommendation Clinical Impression  Pt presents s/p L TKA POD 1 with decreased ROM, strength, and mobility.  Pt has history of B THA in 2007.  Tolerated ambulation in hallway well with RW with no c/o dizziness/nausea.  Pt will benefit from skilled PT in acute venue to address deficits.  PT recommends HHPT for follow up therapy at D/C to return pt to PLOF.     PT Assessment  Patient needs continued PT services    Follow Up Recommendations  Home health PT    Barriers to Discharge None      Equipment Recommendations  Rolling walker with 5" wheels    Recommendations for Other Services OT consult   Frequency 7X/week    Precautions / Restrictions Precautions Precautions: Knee Required Braces or Orthoses: Knee Immobilizer - Left Knee Immobilizer - Left: Discontinue once straight leg raise with < 10 degree lag Restrictions Weight Bearing Restrictions: No Other Position/Activity Restrictions: WBAT   Pertinent Vitals/Pain 4/10      Mobility  Bed Mobility Bed Mobility: Supine to Sit;Sitting - Scoot to Edge of Bed Supine to Sit: 4: Min assist Sitting - Scoot to [place] of Bed: 4: Min assist Details for Bed Mobility Assistance: Min assist for L LE off EOB with cues for hand placement on bed to self assist trunk.  Transfers Transfers: Sit to Stand;Stand to Sit Sit to Stand: 4: Min assist;From elevated surface;With upper extremity assist;From bed Stand to Sit: 4: Min assist;With upper extremity assist;With armrests;To chair/3-in-1 Details for Transfer Assistance: Assist to rise and steady with cues for hand placement and LE management when sitting/standing.   Ambulation/Gait Ambulation/Gait Assistance: 4: Min assist Ambulation Distance (Feet): 50 Feet Assistive device: Rolling  walker Ambulation/Gait Assistance Details: Cues for sequencing/technique with RW, upright and relaxed posture and equal step lengths.  Gait Pattern: Step-to pattern;Decreased stance time - left;Decreased step length - right Gait velocity: decreased Stairs: No Wheelchair Mobility Wheelchair Mobility: No    Exercises     PT Diagnosis: Abnormality of gait;Generalized weakness;Acute pain  PT Problem List: Decreased strength;Decreased range of motion;Decreased balance;Decreased mobility;Decreased knowledge of use of DME;Pain;Decreased activity tolerance PT Treatment Interventions: DME instruction;Gait training;Stair training;Functional mobility training;Therapeutic activities;Therapeutic exercise;Balance training;Patient/family education   PT Goals Acute Rehab PT Goals PT Goal Formulation: With patient Time For Goal Achievement: 03/22/12 Potential to Achieve Goals: Good Pt will go Sit to Supine/Side: with supervision PT Goal: Sit to Supine/Side - Progress: Goal set today Pt will go Sit to Stand: with supervision PT Goal: Sit to Stand - Progress: Goal set today Pt will go Stand to Sit: with modified independence PT Goal: Stand to Sit - Progress: Goal set today Pt will Ambulate: 51 - 150 feet;with supervision;with least restrictive assistive device PT Goal: Ambulate - Progress: Goal set today Pt will Go Up / Down Stairs: 3-5 stairs;with supervision;with least restrictive assistive device PT Goal: Up/Down Stairs - Progress: Goal set today  Visit Information  Last PT Received On: 03/19/12 Assistance Needed: +1    Subjective Data  Subjective: I'm feeling ok Patient Stated Goal: to get home   Prior Functioning  Home Living Lives With: Spouse Available Help at Discharge: Family Type of Home: House Home Access: Stairs to enter Secretary/administrator of Steps: 4 Entrance Stairs-Rails: Right Home Layout: One level Bathroom Shower/Tub: Health visitor: Handicapped  height Home Adaptive Equipment: Shower chair with back;Walker - standard Prior Function Level of Independence: Independent Able to Take Stairs?: Yes Driving: Yes Vocation: Retired Musician: No difficulties    Cognition  Overall Cognitive Status: Appears within functional limits for tasks assessed/performed Arousal/Alertness: Awake/alert Orientation Level: Appears intact for tasks assessed Behavior During Session: [facility] for tasks performed    Extremity/Trunk Assessment Right Lower Extremity Assessment RLE ROM/Strength/Tone: WFL for tasks assessed RLE Sensation: WFL - Light Touch RLE Coordination: WFL - gross motor Left Lower Extremity Assessment LLE ROM/Strength/Tone: Deficits LLE ROM/Strength/Tone Deficits: can perform SLR, not full ROM, therefore had pt ambulate with KI, ankle motions WFL LLE Sensation: WFL - Light Touch LLE Coordination: WFL - gross motor Trunk Assessment Trunk Assessment: Normal   Balance    End of Session PT - End of Session Equipment Utilized During Treatment: Left knee immobilizer Activity Tolerance: Patient tolerated treatment well Patient left: in chair;with call bell/phone within reach Nurse Communication: Mobility status  GP     Page, Meribeth Mattes 03/19/2012, 10:13 AM

## 2012-03-19 NOTE — Progress Notes (Signed)
   Subjective: 1 Day Post-Op Procedure(s) (LRB): TOTAL KNEE ARTHROPLASTY (Left) Patient reports pain as mild.   Patient seen in rounds with Dr. Lequita Halt. Patient is well, and has had no acute complaints or problems We will start therapy today.  Plan is to go Home after hospital stay.  Objective: Vital signs in last 24 hours: Temp:  [97.6 F (36.4 C)-98.3 F (36.8 C)] 98.2 F (36.8 C) (07/16 0508) Pulse Rate:  [65-79] 76  (07/16 0508) Resp:  [12-18] 16  (07/16 0508) BP: (97-116)/(61-74) 104/67 mmHg (07/16 0508) SpO2:  [94 %-100 %] 96 % (07/16 0508) Weight:  [78.019 kg (172 lb)] 78.019 kg (172 lb) (07/15 1415)  Intake/Output from previous day:  Intake/Output Summary (Last 24 hours) at 03/19/12 0854 Last data filed at 03/19/12 0520  Gross per 24 hour  Intake 4718.5 ml  Output   3795 ml  Net  923.5 ml    Intake/Output this shift: UOP 225  Labs:  Basename 03/19/12 0359  HGB 9.1*    Basename 03/19/12 0359  WBC 12.2*  RBC 3.68*  HCT 28.3*  PLT 265    Basename 03/19/12 0359  NA 131*  K 4.3  CL 95*  CO2 25  BUN 23  CREATININE 1.04  GLUCOSE 146*  CALCIUM 8.3*   No results found for this basename: LABPT:2,INR:2 in the last 72 hours  EXAM General - Patient is Alert, Appropriate and Oriented Extremity - Neurovascular intact Sensation intact distally Dorsiflexion/Plantar flexion intact Dressing - dressing C/D/I Motor Function - intact, moving foot and toes well on exam.  Hemovac pulled without difficulty.  Past Medical History  Diagnosis Date  . Acid reflux   . High cholesterol   . Dysrhythmia     PVC's with caffeine and anxiety  . Sleep apnea     no CPAP/ last study 9 yrs ago- "mild per patient"  . Heart murmur     since childhood  . Arthritis   . Hypertension     OV with clearance and note Dr Corliss Blacker 6/13 chart  . Asthma     LOV  6/13  Dr Maple Hudson EPIC/ clearance on chart  from 10/12    Assessment/Plan: 1 Day Post-Op Procedure(s) (LRB): TOTAL  KNEE ARTHROPLASTY (Left) Principal Problem:  *OA (osteoarthritis) of knee   Advance diet Up with therapy Continue foley due to strict I&O and urinary output monitoring Discharge home with home health  DVT Prophylaxis - Xarelto Weight-Bearing as tolerated to left leg Keep foley until tomorrow. No vaccines. D/C PCA Morphine, Change to IV push D/C O2 and Pulse OX and try on Room [address]  Patrica Duel 03/19/2012, 8:54 AM

## 2012-03-19 NOTE — Progress Notes (Signed)
CARE MANAGEMENT NOTE 03/19/2012  Patient:  RAMYAH, PANKOWSKI   Account Number:  [id number]  Date Initiated:  03/19/2012  Documentation initiated by:  Colleen Can  Subjective/Objective Assessment:   dx osteoaarthritis left knee; total knee replacemnt  Genevieve Norlander referral from MD office     Action/Plan:   CM spoke with patient' Plans are for patient o return to her hone in [facility] where spouse,and sister and daughter will be caregivers. Alrady has potty chair. Has wlker but not sure if it has wheels. She will have spouse bring walker   Anticipated DC Date:  03/21/2012   Anticipated DC Plan:  HOME W HOME HEALTH SERVICES  In-house referral  NA      DC Planning Services  CM consult      [facility] Choice  HOME HEALTH   Choice offered to / List presented to:  C-1 Patient        HH arranged  HH-2 PT      [facility] agency  [facility]   Status of service:  In process, will continue to follow   Comments:  03/19/2012 Raynelle Bring BSN CCM [phone] Pt plans to use [facility] for [facility] services upon discharge from hospital. List of [facility] agencies placed in shadow chart. CM will follow.Marland Kitchen

## 2012-03-20 DIAGNOSIS — D62 Acute posthemorrhagic anemia: Secondary | ICD-10-CM | POA: Diagnosis not present

## 2012-03-20 DIAGNOSIS — E871 Hypo-osmolality and hyponatremia: Secondary | ICD-10-CM | POA: Diagnosis not present

## 2012-03-20 LAB — CBC
HCT: 23.4 % — ABNORMAL LOW (ref 36.0–46.0)
Platelets: 229 10*3/uL (ref 150–400)
RBC: 2.95 MIL/uL — ABNORMAL LOW (ref 3.87–5.11)
RBC: 3 MIL/uL — ABNORMAL LOW (ref 3.87–5.11)
RDW: 16.3 % — ABNORMAL HIGH (ref 11.5–15.5)
RDW: 16.3 % — ABNORMAL HIGH (ref 11.5–15.5)
WBC: 7.3 10*3/uL (ref 4.0–10.5)
WBC: 8 10*3/uL (ref 4.0–10.5)

## 2012-03-20 LAB — BASIC METABOLIC PANEL WITH GFR
BUN: 20 mg/dL (ref 6–23)
CO2: 28 meq/L (ref 19–32)
Calcium: 7.8 mg/dL — ABNORMAL LOW (ref 8.4–10.5)
Chloride: 100 meq/L (ref 96–112)
Creatinine, Ser: 1 mg/dL (ref 0.50–1.10)
GFR calc Af Amer: 68 mL/min — ABNORMAL LOW
GFR calc non Af Amer: 59 mL/min — ABNORMAL LOW
Glucose, Bld: 115 mg/dL — ABNORMAL HIGH (ref 70–99)
Potassium: 4.3 meq/L (ref 3.5–5.1)
Sodium: 134 meq/L — ABNORMAL LOW (ref 135–145)

## 2012-03-20 MED ORDER — ESOMEPRAZOLE MAGNESIUM 40 MG PO CPDR
40.0000 mg | DELAYED_RELEASE_CAPSULE | Freq: Every day | ORAL | Status: DC
  Administered 2012-03-21: 40 mg via ORAL
  Filled 2012-03-20 (×2): qty 1

## 2012-03-20 NOTE — Progress Notes (Signed)
   Subjective: 2 Days Post-Op Procedure(s) (LRB): TOTAL KNEE ARTHROPLASTY (Left) Patient reports pain as mild.  Walked over 50 feet yesterday. Patient seen in rounds with Dr. Lequita Halt. Patient is well, and has had no acute complaints or problems Plan is to go Home after hospital stay.  Objective: Vital signs in last 24 hours: Temp:  [97.5 F (36.4 C)-98.3 F (36.8 C)] 98.3 F (36.8 C) (07/17 0620) Pulse Rate:  [68-80] 77  (07/17 0620) Resp:  [16] 16  (07/17 0620) BP: (124-132)/(75-80) 132/79 mmHg (07/17 0620) SpO2:  [93 %-98 %] 93 % (07/17 0620)  Intake/Output from previous day:  Intake/Output Summary (Last 24 hours) at 03/20/12 1142 Last data filed at 03/20/12 0900  Gross per 24 hour  Intake 2162.59 ml  Output   5801 ml  Net -3638.41 ml    Intake/Output this shift: Total I/O In: 276.7 [P.O.:240; I.V.:36.7] Out: 800 [Urine:800]  Labs:  [facility] 03/20/12 0412 03/19/12 0359  HGB 7.3* 9.1*    Basename 03/20/12 0412 03/19/12 0359  WBC 7.3 12.2*  RBC 2.95* 3.68*  HCT 22.9* 28.3*  PLT 229 265    Basename 03/20/12 0412 03/19/12 0359  NA 134* 131*  K 4.3 4.3  CL 100 95*  CO2 28 25  BUN 20 23  CREATININE 1.00 1.04  GLUCOSE 115* 146*  CALCIUM 7.8* 8.3*   No results found for this basename: LABPT:2,INR:2 in the last 72 hours  EXAM General - Patient is Alert, Appropriate and Oriented Extremity - Neurovascular intact Sensation intact distally Dorsiflexion/Plantar flexion intact Dressing/Incision - clean, dry, no drainage, healing Motor Function - intact, moving foot and toes well on exam.   Past Medical History  Diagnosis Date  . Acid reflux   . High cholesterol   . Dysrhythmia     PVC's with caffeine and anxiety  . Sleep apnea     no CPAP/ last study 9 yrs ago- "mild per patient"  . Heart murmur     since childhood  . Arthritis   . Hypertension     OV with clearance and note Dr Corliss Blacker 6/13 chart  . Asthma     LOV  6/13  Dr Maple Hudson EPIC/ clearance on  chart  from 10/12    Assessment/Plan: 2 Days Post-Op Procedure(s) (LRB): TOTAL KNEE ARTHROPLASTY (Left) Principal Problem:  *OA (osteoarthritis) of knee Active Problems:  Postop Acute blood loss anemia  Postop Hyponatremia   Up with therapy HGB is down to 7.3.  She denies any symptoms at this time.  Will recheck HGB later this afternoon and will also monitor for symptoms with therapy today.  DVT Prophylaxis - Xarelto Weight-Bearing as tolerated to left leg  PERKINS, ALEXZANDREW 03/20/2012, 11:42 AM

## 2012-03-20 NOTE — Progress Notes (Signed)
Physical Therapy Treatment Patient Details Name: [name] MRN: [id number] DOB: 12-25-1950 Today's Date: 03/20/2012 Time: 3086-5784 PT Time Calculation (min): 24 min  PT Assessment / Plan / Recommendation Comments on Treatment Session  Pt with increased pain this afternoon with overall look of fatigue.  Deferred OOB and performed exercises in bed.     Follow Up Recommendations  Home health PT    Barriers to Discharge        Equipment Recommendations  Rolling walker with 5" wheels;3 in 1 bedside comode    Recommendations for Other Services    Frequency 7X/week   Plan Discharge plan remains appropriate    Precautions / Restrictions Precautions Precautions: Knee Required Braces or Orthoses: Knee Immobilizer - Left Knee Immobilizer - Left: Discontinue once straight leg raise with < 10 degree lag Restrictions Weight Bearing Restrictions: No Other Position/Activity Restrictions: WBAT   Pertinent Vitals/Pain 7/10    Mobility  Bed Mobility Bed Mobility: Not assessed Transfers Transfers: Not assessed Ambulation/Gait Ambulation/Gait Assistance: Not tested (comment)    Exercises Total Joint Exercises Ankle Circles/Pumps: AROM;Both;20 reps Quad Sets: AROM;Strengthening;Left;10 reps Heel Slides: AAROM;Left;10 reps Hip ABduction/ADduction: AAROM;Left;10 reps Straight Leg Raises: AAROM;Left;10 reps   PT Diagnosis:    PT Problem List:   PT Treatment Interventions:     PT Goals Acute Rehab PT Goals PT Goal Formulation: With patient Time For Goal Achievement: 03/22/12 Potential to Achieve Goals: Good  Visit Information  Last PT Received On: 03/20/12 Assistance Needed: +1    Subjective Data  Subjective: I'm in quite a bit of pain Patient Stated Goal: to get home   Cognition  Overall Cognitive Status: Appears within functional limits for tasks assessed/performed Arousal/Alertness: Awake/alert Orientation Level: Appears intact for tasks assessed Behavior During  Session: [facility] for tasks performed    Balance     End of Session PT - End of Session Activity Tolerance: Patient limited by pain Patient left: in bed;with call bell/phone within reach CPM Left Knee CPM Left Knee: On   GP     Page, Meribeth Mattes 03/20/2012, 4:45 PM

## 2012-03-20 NOTE — Progress Notes (Addendum)
Physical Therapy Treatment Patient Details Name: [name] MRN: [id number] DOB: 1950/09/24 Today's Date: 03/20/2012 Time: 4098-1191 PT Time Calculation (min): 21 min  PT Assessment / Plan / Recommendation Comments on Treatment Session  Pt progressing well, however with increased c/o pain today.  Pt with no signs/symptoms of low HGB during session.     Follow Up Recommendations  Home health PT    Barriers to Discharge        Equipment Recommendations  Rolling walker with 5" wheels;3 in 1 bedside comode    Recommendations for Other Services    Frequency 7X/week   Plan Discharge plan remains appropriate    Precautions / Restrictions Precautions Precautions: Knee Required Braces or Orthoses: Knee Immobilizer - Left Knee Immobilizer - Left: Discontinue once straight leg raise with < 10 degree lag Restrictions Weight Bearing Restrictions: No Other Position/Activity Restrictions: WBAT   Pertinent Vitals/Pain 7/10    Mobility  Bed Mobility Bed Mobility: Sit to Supine Sit to Supine: 4: Min guard Details for Bed Mobility Assistance: Min/guard for safety of LLE into and out of bed with min cues for technique.  Transfers Transfers: Sit to Stand;Stand to Sit Sit to Stand: 4: Min guard;With upper extremity assist;With armrests;From chair/3-in-1 Stand to Sit: 4: Min guard;With upper extremity assist;To elevated surface;To bed Details for Transfer Assistance: Performed transfer x 2 reps to/from 3in1 as well.  Cues for hand placement and LE management when sitting/standing.  Ambulation/Gait Ambulation/Gait Assistance: 4: Min guard Ambulation Distance (Feet): 120 Feet Assistive device: Rolling walker Ambulation/Gait Assistance Details: Cues for sequencing/technique with RW, maintain upright posture and not stepping too far inside RW.  Gait Pattern: Step-to pattern;Decreased stance time - left;Decreased step length - right Gait velocity: decreased    Exercises     PT  Diagnosis:    PT Problem List:   PT Treatment Interventions:     PT Goals Acute Rehab PT Goals PT Goal Formulation: With patient Time For Goal Achievement: 03/22/12 Potential to Achieve Goals: Good Pt will go Sit to Supine/Side: with supervision PT Goal: Sit to Supine/Side - Progress: Progressing toward goal Pt will go Sit to Stand: with supervision PT Goal: Sit to Stand - Progress: Progressing toward goal Pt will go Stand to Sit: with modified independence PT Goal: Stand to Sit - Progress: Progressing toward goal Pt will Ambulate: 51 - 150 feet;with supervision;with least restrictive assistive device PT Goal: Ambulate - Progress: Progressing toward goal  Visit Information  Last PT Received On: 03/20/12 Assistance Needed: +1    Subjective Data  Subjective: I'm more sore today than yesterday Patient Stated Goal: to get home   Cognition  Overall Cognitive Status: Appears within functional limits for tasks assessed/performed Arousal/Alertness: Awake/alert Orientation Level: Appears intact for tasks assessed Behavior During Session: [facility] for tasks performed    Balance     End of Session PT - End of Session Equipment Utilized During Treatment: Left knee immobilizer Activity Tolerance: Patient tolerated treatment well;Patient limited by pain Patient left: in bed;with call bell/phone within reach;with family/visitor present Nurse Communication: Mobility status   GP     Page, Meribeth Mattes 03/20/2012, 12:07 PM

## 2012-03-20 NOTE — Progress Notes (Signed)
Occupational Therapy Treatment Patient Details Name: [name] MRN: [id number] DOB: 1951-08-25 Today's Date: 03/20/2012 Time: 4098-1191 OT Time Calculation (min): 34 min  OT Assessment / Plan / Recommendation Comments on Treatment Session      Follow Up Recommendations  No OT follow up    Barriers to Discharge       Equipment Recommendations  Rolling walker with 5" wheels;3 in 1 bedside comode (would benefit from 3:1 even though she has comfort height )    Recommendations for Other Services    Frequency Min 2X/week   Plan      Precautions / Restrictions Precautions Precautions: Knee Required Braces or Orthoses: Knee Immobilizer - Left Knee Immobilizer - Left: Discontinue once straight leg raise with < 10 degree lag Restrictions Other Position/Activity Restrictions: WBAT   Pertinent Vitals/Pain 5/10 L knee; premedicated and repositioned    ADL   Lower Body Bathing: Performed;Minimal assistance (with reacher) Where Assessed - Lower Body Bathing: Supported sit to Pharmacist, hospital: Performed;Minimal assistance Toilet Transfer Method:  (ambulate) Acupuncturist: Comfort height toilet Toileting - Clothing Manipulation and Hygiene: Performed;Set up Where Assessed - Toileting Clothing Manipulation and Hygiene: Sit on 3-in-1 or toilet (hygiene) Tub/Shower Transfer: Performed;Minimal assistance (mod cues) Tub/Shower Transfer Method: Ambulating Equipment Used: Rolling walker;Knee Immobilizer Transfers/Ambulation Related to ADLs: min guard ambulation with min cues for walker distance ADL Comments: Pt has had 2 hip replacements:  has AE.  Needs reinforcement to feel comfortable as she doesn't want to do anything wrong:  emphasized that she really won't do anything wrong:  she just needs to take her time and be safe    OT Diagnosis: Generalized weakness  OT Problem List: Decreased strength;Decreased activity tolerance;Decreased knowledge of use of DME or AE OT  Treatment Interventions: Self-care/ADL training;DME and/or AE instruction;Patient/family education   OT Goals Acute Rehab OT Goals OT Goal Formulation: With patient Time For Goal Achievement: 03/27/12 Potential to Achieve Goals: Good ADL Goals Pt Will Perform Lower Body Bathing: with supervision;with adaptive equipment;Sit to stand from chair ADL Goal: Lower Body Bathing - Progress: Progressing toward goals Pt Will Transfer to Toilet: with supervision;Ambulation;Comfort height toilet ADL Goal: Toilet Transfer - Progress: Progressing toward goals Pt Will Perform Tub/Shower Transfer: Shower transfer;with min assist;Ambulation;Shower seat with back ADL Goal: Tub/Shower Transfer - Progress: Progressing toward goals  Visit Information  Last OT Received On: 03/20/12 Assistance Needed: +1    Subjective Data  Subjective: I just don't want to do anything wrong   Prior Functioning  Home Living Lives With: Spouse Available Help at Discharge: Family Type of Home: House Home Access: Stairs to enter Secretary/administrator of Steps: 4 Entrance Stairs-Rails: Right Home Layout: One level Bathroom Shower/Tub: Health visitor: Handicapped height Home Adaptive Equipment: Shower chair with back;Walker - standard Prior Function Level of Independence: Independent Communication Communication: No difficulties    Cognition  Overall Cognitive Status: Appears within functional limits for tasks assessed/performed Behavior During Session: [facility] for tasks performed    Mobility Bed Mobility Bed Mobility: Supine to Sit;Sit to Supine Supine to Sit: 4: Min guard Transfers Sit to Stand: 4: Min guard;From elevated surface;With upper extremity assist;From bed Stand to Sit: 4: Min assist;To chair/3-in-1;With armrests   Exercises    Balance    End of Session OT - End of Session Activity Tolerance: Patient tolerated treatment well Patient left: in chair;with call bell/phone within reach   GO     [facility] 03/20/2012, 9:43 AM Marica Otter, OTR/L [phone] 03/20/2012

## 2012-03-20 NOTE — Evaluation (Signed)
Occupational Therapy Evaluation Patient Details Name: [name] MRN: [id number] DOB: [birth year] Today's Date: 03/20/2012 Time: 0811-0825 OT Time Calculation (min): 14 min  OT Assessment / Plan / Recommendation Clinical Impression  This 61 year old female was admittted for L TKA.  She is appropriate for skilled OT to continue education for ADLs and bathroom transfers for comfort with these and safe discharge home.      OT Assessment  Patient needs continued OT Services    Follow Up Recommendations  No OT follow up    Barriers to Discharge      Equipment Recommendations  Rolling walker with 5" wheels    Recommendations for Other Services    Frequency  Min 2X/week    Precautions / Restrictions Precautions Precautions: Knee Required Braces or Orthoses: Knee Immobilizer - Left Knee Immobilizer - Left: Discontinue once straight leg raise with < 10 degree lag Restrictions Other Position/Activity Restrictions: WBAT   Pertinent Vitals/Pain L thigh sore; repositioned; premedicated    ADL  Eating/Feeding: Performed;Independent Where Assessed - Eating/Feeding: Chair Grooming: Simulated;Set up Where Assessed - Grooming: Unsupported sitting Upper Body Bathing: Simulated;Set up Where Assessed - Upper Body Bathing: Unsupported sitting Lower Body Bathing: Simulated;Minimal assistance Where Assessed - Lower Body Bathing: Supported sit to stand Upper Body Dressing: Simulated;Set up Where Assessed - Upper Body Dressing: Unsupported sitting Lower Body Dressing: Simulated;Moderate assistance Where Assessed - Lower Body Dressing: Supported sit to Pharmacist, hospital: Simulated;Minimal assistance (bed to chair) Toilet Transfer Method: Stand pivot Toileting - Clothing Manipulation and Hygiene: Simulated;Min guard Where Assessed - Toileting Clothing Manipulation and Hygiene: Sit to stand from 3-in-1 or toilet Equipment Used: Rolling walker;Knee Immobilizer Transfers/Ambulation  Related to ADLs: Pt's breakfast came.  She would like to go over self care and bathroom transfers after this    OT Diagnosis: Generalized weakness  OT Problem List: Decreased strength;Decreased activity tolerance;Decreased knowledge of use of DME or AE OT Treatment Interventions: Self-care/ADL training;DME and/or AE instruction;Patient/family education   OT Goals Acute Rehab OT Goals OT Goal Formulation: With patient Time For Goal Achievement: 03/20/12 Potential to Achieve Goals: Good ADL Goals Pt Will Perform Lower Body Bathing: with supervision;with adaptive equipment;Sit to stand from chair ADL Goal: Lower Body Bathing - Progress: Goal set today Pt Will Perform Lower Body Dressing: with supervision;with adaptive equipment;Sit to stand from chair ADL Goal: Lower Body Dressing - Progress: Goal set today Pt Will Transfer to Toilet: with supervision;Ambulation;Comfort height toilet ADL Goal: Toilet Transfer - Progress: Goal set today Pt Will Perform Tub/Shower Transfer: Shower transfer;with min assist;Ambulation;Shower seat with back (min guard, min cues) ADL Goal: Tub/Shower Transfer - Progress: Goal set today  Visit Information  Last OT Received On: 03/20/12 Assistance Needed: +1    Subjective Data  Subjective: I just don't want to do anything wrong   Prior Functioning  Vision/Perception  Home Living Lives With: Spouse Available Help at Discharge: Family Type of Home: House Home Access: Stairs to enter Secretary/administrator of Steps: 4 Entrance Stairs-Rails: Right Home Layout: One level Bathroom Shower/Tub: Health visitor: Handicapped height Home Adaptive Equipment: Shower chair with back;Walker - standard Prior Function Level of Independence: Independent Communication Communication: No difficulties      Cognition  Overall Cognitive Status: Appears within functional limits for tasks assessed/performed Behavior During Session: [facility] for tasks performed     Extremity/Trunk Assessment Right Upper Extremity Assessment RUE ROM/Strength/Tone: Within functional levels Left Upper Extremity Assessment LUE ROM/Strength/Tone: Within functional levels   Mobility Bed Mobility  Bed Mobility: Supine to Sit;Sit to Supine Supine to Sit: 4: Min guard Transfers Sit to Stand: 4: Min guard;From elevated surface;With upper extremity assist;From bed Stand to Sit: 4: Min assist;To chair/3-in-1;With armrests (to help extend LLE)   Exercise    Balance    End of Session OT - End of Session Activity Tolerance: Patient tolerated treatment well Patient left: in chair;with call bell/phone within reach  GO     [facility] 03/20/2012, 8:44 AM Marica Otter, OTR/L [phone] 03/20/2012

## 2012-03-21 LAB — BASIC METABOLIC PANEL
BUN: 14 mg/dL (ref 6–23)
CO2: 27 mEq/L (ref 19–32)
Calcium: 8.3 mg/dL — ABNORMAL LOW (ref 8.4–10.5)
Glucose, Bld: 122 mg/dL — ABNORMAL HIGH (ref 70–99)
Potassium: 3.8 mEq/L (ref 3.5–5.1)
Sodium: 133 mEq/L — ABNORMAL LOW (ref 135–145)

## 2012-03-21 LAB — CBC
HCT: 22.2 % — ABNORMAL LOW (ref 36.0–46.0)
Hemoglobin: 7.2 g/dL — ABNORMAL LOW (ref 12.0–15.0)
MCH: 25.1 pg — ABNORMAL LOW (ref 26.0–34.0)
RBC: 2.87 MIL/uL — ABNORMAL LOW (ref 3.87–5.11)

## 2012-03-21 MED ORDER — BISACODYL 5 MG PO TBEC
10.0000 mg | DELAYED_RELEASE_TABLET | Freq: Every day | ORAL | Status: DC | PRN
  Administered 2012-03-21: 10 mg via ORAL

## 2012-03-21 MED ORDER — METHOCARBAMOL 500 MG PO TABS: 500.0000 mg | ORAL_TABLET | Freq: Four times a day (QID) | ORAL | Status: AC | PRN

## 2012-03-21 MED ORDER — RIVAROXABAN 10 MG PO TABS: 10.0000 mg | ORAL_TABLET | Freq: Every day | ORAL | Status: DC

## 2012-03-21 MED ORDER — POLYSACCHARIDE IRON COMPLEX 150 MG PO CAPS
150.0000 mg | ORAL_CAPSULE | Freq: Two times a day (BID) | ORAL | Status: DC
  Filled 2012-03-21 (×2): qty 1

## 2012-03-21 MED ORDER — POLYSACCHARIDE IRON COMPLEX 150 MG PO CAPS: 150.0000 mg | ORAL_CAPSULE | Freq: Two times a day (BID) | ORAL | Status: DC

## 2012-03-21 MED ORDER — OXYCODONE HCL 5 MG PO TABS: 5.0000 mg | ORAL_TABLET | ORAL | Status: AC | PRN

## 2012-03-21 NOTE — Discharge Summary (Signed)
Physician Discharge Summary   Patient ID: [name] MRN: [id number] DOB/AGE: 02/07/1951 61 y.o.  Admit date: 03/18/2012 Discharge date: 03/21/2012  Primary Diagnosis: Osteoarthritis, Left Knee greater than Right Knee   Admission Diagnoses:  Past Medical History  Diagnosis Date  . Acid reflux   . High cholesterol   . Dysrhythmia     PVC's with caffeine and anxiety  . Sleep apnea     no CPAP/ last study 9 yrs ago- "mild per patient"  . Heart murmur     since childhood  . Arthritis   . Hypertension     OV with clearance and note Dr Corliss Blacker 6/13 chart  . Asthma     LOV  6/13  Dr Maple Hudson EPIC/ clearance on chart  from 10/12   Discharge Diagnoses:   Principal Problem:  *OA (osteoarthritis) of knee Active Problems:  Postop Acute blood loss anemia  Postop Hyponatremia  Procedure:  Procedure(s) (LRB): TOTAL KNEE ARTHROPLASTY (Left)   Consults: None  HPI: [name] is a 61 y.o. year old female with end stage OA of her left knee with progressively worsening pain and dysfunction. She has constant pain, with activity and at rest and significant functional deficits with difficulties even with ADLs. She has had extensive non-op management including analgesics, injections of cortisone and viscosupplements, and home exercise program, but remains in significant pain with significant dysfunction. Radiographs show bone on bone arthritis all 3 compartments. She presents now for left Total Knee Arthroplasty. She also has significant symptomatic arthritis of her right knee and requests cortisone injection.  Laboratory Data: Hospital Outpatient Visit on 03/12/2012  Component Date Value Range Status  . aPTT 03/12/2012 32  24 - 37 seconds Final  . WBC 03/12/2012 3.5* 4.0 - 10.5 K/uL Final  . RBC 03/12/2012 5.20* 3.87 - 5.11 MIL/uL Final  . Hemoglobin 03/12/2012 13.0  12.0 - 15.0 g/dL Final  . HCT 40/98/1191 40.6  36.0 - 46.0 % Final  . MCV 03/12/2012 78.1  78.0 - 100.0 fL Final  .  MCH 03/12/2012 25.0* 26.0 - 34.0 pg Final  . MCHC 03/12/2012 32.0  30.0 - 36.0 g/dL Final  . RDW 47/82/9562 16.0* 11.5 - 15.5 % Final  . Platelets 03/12/2012 311  150 - 400 K/uL Final  . Sodium 03/12/2012 139  135 - 145 mEq/L Final  . Potassium 03/12/2012 4.7  3.5 - 5.1 mEq/L Final  . Chloride 03/12/2012 101  96 - 112 mEq/L Final  . CO2 03/12/2012 29  19 - 32 mEq/L Final  . Glucose, Bld 03/12/2012 102* 70 - 99 mg/dL Final  . BUN 13/04/6577 17  6 - 23 mg/dL Final  . Creatinine, Ser 03/12/2012 1.04  0.50 - 1.10 mg/dL Final  . Calcium 46/96/2952 10.0  8.4 - 10.5 mg/dL Final  . Total Protein 03/12/2012 7.6  6.0 - 8.3 g/dL Final  . Albumin 84/13/2440 4.0  3.5 - 5.2 g/dL Final  . AST 07/01/2535 58* 0 - 37 U/L Final  . ALT 03/12/2012 34  0 - 35 U/L Final  . Alkaline Phosphatase 03/12/2012 101  39 - 117 U/L Final  . Total Bilirubin 03/12/2012 0.5  0.3 - 1.2 mg/dL Final  . GFR calc non Af Amer 03/12/2012 56* >90 mL/min Final  . GFR calc Af Amer 03/12/2012 65* >90 mL/min Final   Comment:  The eGFR has been calculated                          using the CKD EPI equation.                          This calculation has not been                          validated in all clinical                          situations.                          eGFR's persistently                          <90 mL/min signify                          possible Chronic Kidney Disease.  Marland Kitchen Neutrophils Relative 03/12/2012 39* 43 - 77 % Final  . Neutro Abs 03/12/2012 1.4* 1.7 - 7.7 K/uL Final  . Lymphocytes Relative 03/12/2012 48* 12 - 46 % Final  . Lymphs Abs 03/12/2012 1.7  0.7 - 4.0 K/uL Final  . Monocytes Relative 03/12/2012 11  3 - 12 % Final  . Monocytes Absolute 03/12/2012 0.4  0.1 - 1.0 K/uL Final  . Eosinophils Relative 03/12/2012 1  0 - 5 % Final  . Eosinophils Absolute 03/12/2012 0.1  0.0 - 0.7 K/uL Final  . Basophils Relative 03/12/2012 1  0 - 1 % Final  . Basophils Absolute  03/12/2012 0.0  0.0 - 0.1 K/uL Final  . Prothrombin Time 03/12/2012 13.3  11.6 - 15.2 seconds Final  . INR 03/12/2012 0.99  0.00 - 1.49 Final  . Color, Urine 03/12/2012 YELLOW  YELLOW Final  . APPearance 03/12/2012 CLEAR  CLEAR Final  . Specific Gravity, Urine 03/12/2012 1.010  1.005 - 1.030 Final  . pH 03/12/2012 6.5  5.0 - 8.0 Final  . Glucose, UA 03/12/2012 NEGATIVE  NEGATIVE mg/dL Final  . Hgb urine dipstick 03/12/2012 NEGATIVE  NEGATIVE Final  . Bilirubin Urine 03/12/2012 NEGATIVE  NEGATIVE Final  . Ketones, ur 03/12/2012 NEGATIVE  NEGATIVE mg/dL Final  . Protein, ur 16/06/9603 NEGATIVE  NEGATIVE mg/dL Final  . Urobilinogen, UA 03/12/2012 0.2  0.0 - 1.0 mg/dL Final  . Nitrite 54/05/8118 NEGATIVE  NEGATIVE Final  . Leukocytes, UA 03/12/2012 NEGATIVE  NEGATIVE Final   MICROSCOPIC NOT DONE ON URINES WITH NEGATIVE PROTEIN, BLOOD, LEUKOCYTES, NITRITE, OR GLUCOSE <1000 mg/dL.  Marland Kitchen MRSA, PCR 03/12/2012 NEGATIVE  NEGATIVE Final  . Staphylococcus aureus 03/12/2012 NEGATIVE  NEGATIVE Final   Comment:                                 The Xpert SA Assay (FDA                          approved for NASAL specimens                          only), is one component of  a comprehensive surveillance                          program.  It is not intended                          to diagnose infection nor to                          guide or monitor treatment.    Basename 03/21/12 0405 03/20/12 1410 03/20/12 0412 03/19/12 0359  HGB 7.2* 7.5* 7.3* 9.1*    Basename 03/21/12 0405 03/20/12 1410  WBC 6.1 8.0  RBC 2.87* 3.00*  HCT 22.2* 23.4*  PLT 221 217    Basename 03/21/12 0405 03/20/12 0412  NA 133* 134*  K 3.8 4.3  CL 99 100  CO2 27 28  BUN 14 20  CREATININE 0.93 1.00  GLUCOSE 122* 115*  CALCIUM 8.3* 7.8*   No results found for this basename: LABPT:2,INR:2 in the last 72 hours  X-Rays:Dg Chest 2 View  03/12/2012  *RADIOLOGY REPORT*  Clinical Data: Preop for left  knee arthroplasty.  History of asthma.  Hypertension.  CHEST - 2 VIEW  Comparison: 04/24/2010  Findings: There is a probable loose body within the left glenohumeral joint.  2.5 cm.  Midline trachea.  Normal heart size and mediastinal contours. Minimal convex right thoracic spine curvature. No pleural effusion or pneumothorax.  Similar vague increased density along the left heart border on the frontal.  Possibly mild volume loss in the lingula or a prominent epicardial fat pad.  IMPRESSION: No acute cardiopulmonary disease.  Original Report Authenticated By: Consuello Bossier, M.D.    EKG: Orders placed during the hospital encounter of 03/12/12  . EKG 12-LEAD  . EKG 12-LEAD     Hospital Course: Patient was admitted to [facility] and taken to the OR and underwent the above state procedure without complications.  Patient tolerated the procedure well and was later transferred to the recovery room and then to the orthopaedic floor for postoperative care.  They were given PO and IV analgesics for pain control following their surgery.  They were given 24 hours of postoperative antibiotics and started on DVT prophylaxis in the form of Xarelto.   PT and OT were ordered for total joint protocol.  Discharge planning consulted to help with postop disposition and equipment needs.  Patient had a good night on the evening of surgery and started to get up OOB with therapy on day one.  PCA Morphine was discontinued and they were weaned over to PO meds.  Hemovac drain was pulled without difficulty.  Continued to work with therapy into day two.  HGB was down to 7.3.  Despite the low HGB, the patient walked over 50 feet the day before.  Dressing was changed on day two and the incision was healing well.  By day three, the patient had progressed with therapy and meeting their goals.  Incision was healing well.  Patient was seen in rounds and was ready to go home. Discussed the low HGB with her and what to watch out for at  home. Will give iron supplement and allow home later this morning.  Discharge Medications: Prior to Admission medications   Medication Sig Start Date End Date Taking? Authorizing Provider  albuterol (VENTOLIN HFA) 108 (90 BASE) MCG/ACT inhaler Inhale 2 puffs into the lungs every  4 (four) hours as needed for wheezing or shortness of breath. 02/28/12 02/27/13 Yes Clinton D Young, MD  amLODipine (NORVASC) 5 MG tablet Take 5 mg by mouth daily before breakfast.    Yes Historical Provider, MD  atorvastatin (LIPITOR) 80 MG tablet Take 40 mg by mouth at bedtime.    Yes Historical Provider, MD  cetirizine (ZYRTEC) 10 MG tablet Take 10 mg by mouth daily as needed. Allergies   Yes Historical Provider, MD  esomeprazole (NEXIUM) 40 MG packet Take 40 mg by mouth daily before breakfast.    Yes Historical Provider, MD  eszopiclone (LUNESTA) 1 MG TABS Take 3 mg by mouth at bedtime as needed. Sleep   Yes Historical Provider, MD  fenofibrate micronized (LOFIBRA) 200 MG capsule Take 200 mg by mouth at bedtime.    Yes Historical Provider, MD  Fluticasone-Salmeterol (ADVAIR DISKUS) 100-50 MCG/DOSE AEPB Inhale 1 puff into the lungs 2 (two) times daily. Rinse mouth 02/28/12 02/27/13 Yes Clinton D Young, MD  hydroxypropyl methylcellulose (ISOPTO TEARS) 2.5 % ophthalmic solution Place 1 drop into both eyes 3 (three) times daily as needed. Dry eyes   Yes Historical Provider, MD  magnesium gluconate (MAGONATE) 500 MG tablet Take 500 mg by mouth at bedtime as needed.   Yes Historical Provider, MD  mometasone (NASONEX) 50 MCG/ACT nasal spray Place 2 sprays into the nose daily as needed. Allergies 06/27/11 06/26/12 Yes Clinton D Young, MD  montelukast (SINGULAIR) 10 MG tablet Take 1 tablet (10 mg total) by mouth at bedtime. 02/28/12 02/27/13 Yes Clinton D Young, MD  traZODone (DESYREL) 50 MG tablet Take 50 mg by mouth at bedtime.    Yes Historical Provider, MD  iron polysaccharides (NIFEREX) 150 MG capsule Take 1 capsule (150 mg  total) by mouth 2 (two) times daily. 03/21/12 03/21/13  Amanuel Sinkfield, PA  methocarbamol (ROBAXIN) 500 MG tablet Take 1 tablet (500 mg total) by mouth every 6 (six) hours as needed. 03/21/12 03/31/12  Keyon Winnick, PA  oxyCODONE (OXY IR/ROXICODONE) 5 MG immediate release tablet Take 1-2 tablets (5-10 mg total) by mouth every 4 (four) hours as needed for pain. 03/21/12 03/31/12  Tiahna Cure Julien Girt, PA  rivaroxaban (XARELTO) 10 MG TABS tablet Take 1 tablet (10 mg total) by mouth daily with breakfast. Take Xarelto for two and a half more weeks, then discontinue Xarelto. Once the patient has completed the Xarelto, they may resume the 81 mg Aspirin. 03/21/12   Aliha Diedrich Julien Girt, PA    Diet: Cardiac diet Activity:WBAT Follow-up:in 2 weeks Disposition - Home Discharged Condition: good   Discharge Orders    Future Appointments: Provider: Department: Dept Phone: Center:   06/27/2012 1:30 PM [name], MD [facility] [phone] None     Future Orders Please Complete By Expires   Diet - low sodium heart healthy      Diet Carb Modified      Call MD / Call 911      Comments:   If you experience chest pain or shortness of breath, CALL 911 and be transported to the hospital emergency room.  If you develope a fever above 101 F, pus (white drainage) or increased drainage or redness at the wound, or calf pain, call your surgeon's office.   Discharge instructions      Comments:   Pick up stool softner and laxative for home. Do not submerge incision under water. May shower. Continue to use ice for pain and swelling from surgery.   Take Xarelto for two and a  half more weeks, then discontinue Xarelto. Once the patient has completed the Xarelto, they may resume the 81 mg Aspirin.   Constipation Prevention      Comments:   Drink plenty of fluids.  Prune juice may be helpful.  You may use a stool softener, such as Colace (over the counter) 100 mg twice a day.  Use MiraLax (over the  counter) for constipation as needed.   Increase activity slowly as tolerated      Patient may shower      Comments:   You may shower without a dressing once there is no drainage.  Do not wash over the wound.  If drainage remains, do not shower until drainage stops.   Driving restrictions      Comments:   No driving until released by the physician.   Lifting restrictions      Comments:   No lifting until released by the physician.   TED hose      Comments:   Use stockings (TED hose) for 3 weeks on both leg(s).  You may remove them at night for sleeping.   Change dressing      Comments:   Change dressing daily with sterile 4 x 4 inch gauze dressing and apply TED hose. Do not submerge the incision under water.   Do not put a pillow under the knee. Place it under the heel.      Do not sit on low chairs, stoools or toilet seats, as it may be difficult to get up from low surfaces        Medication List  As of 03/21/2012  8:46 AM   STOP taking these medications         aspirin 81 MG chewable tablet      calcium gluconate 500 MG tablet      [employer] LEG CRAMPS PAIN RELIEF PO      cyclobenzaprine 10 MG tablet      estradiol-norethindrone 0.05-0.25 MG/DAY      ibandronate 150 MG tablet      magnesium (amino acid chelate) 133 MG tablet      multivitamin capsule      OVER THE COUNTER MEDICATION      Vitamin D 1000 UNITS capsule         TAKE these medications         albuterol 108 (90 BASE) MCG/ACT inhaler   Commonly known as: PROVENTIL HFA;VENTOLIN HFA   Inhale 2 puffs into the lungs every 4 (four) hours as needed for wheezing or shortness of breath.      amLODipine 5 MG tablet   Commonly known as: NORVASC   Take 5 mg by mouth daily before breakfast.      atorvastatin 80 MG tablet   Commonly known as: LIPITOR   Take 40 mg by mouth at bedtime.      cetirizine 10 MG tablet   Commonly known as: ZYRTEC   Take 10 mg by mouth daily as needed. Allergies      esomeprazole 40 MG  packet   Commonly known as: NEXIUM   Take 40 mg by mouth daily before breakfast.      eszopiclone 1 MG Tabs   Commonly known as: LUNESTA   Take 3 mg by mouth at bedtime as needed. Sleep      fenofibrate micronized 200 MG capsule   Commonly known as: LOFIBRA   Take 200 mg by mouth at bedtime.      Fluticasone-Salmeterol 100-50 MCG/DOSE  Aepb   Commonly known as: ADVAIR   Inhale 1 puff into the lungs 2 (two) times daily. Rinse mouth      hydroxypropyl methylcellulose 2.5 % ophthalmic solution   Commonly known as: ISOPTO TEARS   Place 1 drop into both eyes 3 (three) times daily as needed. Dry eyes      iron polysaccharides 150 MG capsule   Commonly known as: NIFEREX   Take 1 capsule (150 mg total) by mouth 2 (two) times daily.      magnesium gluconate 500 MG tablet   Commonly known as: MAGONATE   Take 500 mg by mouth at bedtime as needed.      methocarbamol 500 MG tablet   Commonly known as: ROBAXIN   Take 1 tablet (500 mg total) by mouth every 6 (six) hours as needed.      mometasone 50 MCG/ACT nasal spray   Commonly known as: NASONEX   Place 2 sprays into the nose daily as needed. Allergies      montelukast 10 MG tablet   Commonly known as: SINGULAIR   Take 1 tablet (10 mg total) by mouth at bedtime.      oxyCODONE 5 MG immediate release tablet   Commonly known as: Oxy IR/ROXICODONE   Take 1-2 tablets (5-10 mg total) by mouth every 4 (four) hours as needed for pain.      rivaroxaban 10 MG Tabs tablet   Commonly known as: XARELTO   Take 1 tablet (10 mg total) by mouth daily with breakfast. Take Xarelto for two and a half more weeks, then discontinue Xarelto.  Once the patient has completed the Xarelto, they may resume the 81 mg Aspirin.      traZODone 50 MG tablet   Commonly known as: DESYREL   Take 50 mg by mouth at bedtime.           Follow-up Information    Follow up with Loanne Drilling, MD. Schedule an appointment as soon as possible for a visit in 2  weeks.   Contact information:   [facility] [address], [address] [place] [address] [phone]          Signed: Patrica Duel 03/21/2012, 8:46 AM

## 2012-03-21 NOTE — Progress Notes (Signed)
Physical Therapy Treatment Patient Details Name: [name] MRN: [id number] DOB: March 22, 1951 Today's Date: 03/21/2012 Time: 1000-1049 PT Time Calculation (min): 49 min  PT Assessment / Plan / Recommendation Comments on Treatment Session  Pt performed well during session. Stated she didn't feel quite like herself,but denied weakness, dizziness/lightheadedness. Completed all education. Issued handout for stair negotiation and ROM exercises.    Follow Up Recommendations  Home health PT    Barriers to Discharge        Equipment Recommendations  None recommended by OT    Recommendations for Other Services    Frequency 7X/week   Plan Discharge plan remains appropriate    Precautions / Restrictions Precautions Precautions: Knee Required Braces or Orthoses: Knee Immobilizer - Left Knee Immobilizer - Left: Discontinue once straight leg raise with < 10 degree lag Restrictions Weight Bearing Restrictions: No LLE Weight Bearing: Weight bearing as tolerated Other Position/Activity Restrictions: WBAT   Pertinent Vitals/Pain     Mobility  Bed Mobility Bed Mobility: Not assessed Transfers Transfers: Sit to Stand;Stand to Sit Sit to Stand: 4: Min guard;With upper extremity assist;From chair/3-in-1;With armrests Stand to Sit: To chair/3-in-1;With upper extremity assist;With armrests;4: Min guard Details for Transfer Assistance: x2. VCs safety, technique, hand placmeent.  Ambulation/Gait Ambulation/Gait Assistance: 4: Min guard Ambulation Distance (Feet): 175 Feet Assistive device: Rolling walker Ambulation/Gait Assistance Details: VCs safety. Slow gait speed. Gait Pattern: Step-to pattern;Decreased stride length;Decreased step length - right;Decreased step length - left Stairs: Yes Stairs Assistance: 4: Min assist Stairs Assistance Details (indicate cue type and reason): Pt initially stated 3 steps only to get into home, so practiced 4 steps with 1 crutch, 1 rail. Husband arrived  later stating that entrance has 3 steps + 1 small threshold step after turn. Reviewed proper technique with husband and pt (3 steps with crutch, last 1 step with RW forwards). Pt/husband verbalized understanding. Stair Management Technique: Forwards;One rail Left;With crutches;Step to pattern Number of Stairs: 4     Exercises Total Joint Exercises Ankle Circles/Pumps: AROM;Both;20 reps;Supine Quad Sets: AROM;Both;Strengthening;10 reps;Supine Short Arc Quad: AROM;Strengthening;Left;10 reps;Supine Heel Slides: AAROM;Strengthening;Left;10 reps;Supine Hip ABduction/ADduction: AAROM;Left;10 reps;Supine Straight Leg Raises: AAROM;10 reps;Supine   PT Diagnosis:    PT Problem List:   PT Treatment Interventions:     PT Goals Acute Rehab PT Goals PT Goal: Sit to Stand - Progress: Progressing toward goal PT Goal: Stand to Sit - Progress: Progressing toward goal PT Goal: Ambulate - Progress: Progressing toward goal PT Goal: Up/Down Stairs - Progress: Progressing toward goal  Visit Information  Last PT Received On: 03/21/12 Assistance Needed: +1    Subjective Data  Subjective: "I still don't feel quite like myself" Patient Stated Goal: Home   Cognition  Overall Cognitive Status: Appears within functional limits for tasks assessed/performed Arousal/Alertness: Awake/alert Orientation Level: Appears intact for tasks assessed Behavior During Session: [facility] for tasks performed    Balance     End of Session PT - End of Session Equipment Utilized During Treatment: Gait belt;Left knee immobilizer Activity Tolerance: Patient tolerated treatment well Patient left: in chair;with call bell/phone within reach;with family/visitor present   GP     Rebeca Alert [facility] 03/21/2012, 10:59 AM [phone]

## 2012-03-21 NOTE — Progress Notes (Signed)
Occupational Therapy Treatment Patient Details Name: [name] MRN: [id number] DOB: 06-15-1951 Today's Date: 03/21/2012 Time: 0802-0830 OT Time Calculation (min): 28 min  OT Assessment / Plan / Recommendation Comments on Treatment Session      Follow Up Recommendations  Home health OT       Equipment Recommendations  None recommended by OT       Frequency Min 2X/week   Plan Discharge plan remains appropriate    Precautions / Restrictions Precautions Precautions: Knee Required Braces or Orthoses: Knee Immobilizer - Left Knee Immobilizer - Left: Discontinue once straight leg raise with < 10 degree lag Restrictions Weight Bearing Restrictions: No Other Position/Activity Restrictions: WBAT       ADL  Grooming: Performed Where Assessed - Grooming: Supported standing;Other (comment) (reacher) Upper Body Dressing: Performed;Set up Where Assessed - Upper Body Dressing: Unsupported sitting Lower Body Dressing: Minimal assistance;Other (comment) (with reacher) Where Assessed - Lower Body Dressing: Unsupported sit to stand Toilet Transfer Method: Sit to stand;Stand pivot;Other (comment) (bed to chair) Toileting - Clothing Manipulation and Hygiene: Performed;Min guard Where Assessed - Toileting Clothing Manipulation and Hygiene: Standing ADL Comments: Pt doing well. Did share with PA pt was feeling nauseous during OT session     OT Goals ADL Goals ADL Goal: Lower Body Dressing - Progress: Progressing toward goals ADL Goal: Toilet Transfer - Progress: Progressing toward goals  Visit Information  Last OT Received On: 03/21/12    Subjective Data  Subjective: I am feeling better today, but a  little woozy      Cognition  Overall Cognitive Status: Appears within functional limits for tasks assessed/performed Arousal/Alertness: Awake/alert Orientation Level: Appears intact for tasks assessed Behavior During Session: [facility] for tasks performed             End of  Session OT - End of Session Activity Tolerance: Patient tolerated treatment well Patient left: in chair Nurse Communication: Mobility status  GO     Alba Cory 03/21/2012, 8:44 AM

## 2012-03-21 NOTE — Progress Notes (Signed)
Discharge summary sent to payer through [facility]  

## 2012-03-21 NOTE — Progress Notes (Signed)
   Subjective: 3 Days Post-Op Procedure(s) (LRB): TOTAL KNEE ARTHROPLASTY (Left) Patient reports pain as mild.   Patient seen in rounds with Dr. Lequita Halt. Patient is well, and has had no acute complaints or problems Patient is ready to go home.  Discussed the low HGB with her and what to watch out for at home.  Will give iron supplement and allow home later this morning.  Objective: Vital signs in last 24 hours: Temp:  [98.6 F (37 C)-98.7 F (37.1 C)] 98.6 F (37 C) (07/18 0635) Pulse Rate:  [79-82] 82  (07/18 0635) Resp:  [14-16] 16  (07/18 0635) BP: (111-120)/(73-77) 111/73 mmHg (07/18 0635) SpO2:  [94 %-97 %] 94 % (07/18 0635)  Intake/Output from previous day:  Intake/Output Summary (Last 24 hours) at 03/21/12 0838 Last data filed at 03/20/12 1900  Gross per 24 hour  Intake    720 ml  Output    800 ml  Net    -80 ml    Intake/Output this shift:    Labs:  Basename 03/21/12 0405 03/20/12 1410 03/20/12 0412 03/19/12 0359  HGB 7.2* 7.5* 7.3* 9.1*    Basename 03/21/12 0405 03/20/12 1410  WBC 6.1 8.0  RBC 2.87* 3.00*  HCT 22.2* 23.4*  PLT 221 217    Basename 03/21/12 0405 03/20/12 0412  NA 133* 134*  K 3.8 4.3  CL 99 100  CO2 27 28  BUN 14 20  CREATININE 0.93 1.00  GLUCOSE 122* 115*  CALCIUM 8.3* 7.8*   No results found for this basename: LABPT:2,INR:2 in the last 72 hours  EXAM: General - Patient is Alert, Appropriate and Oriented Extremity - Neurovascular intact Sensation intact distally Dorsiflexion/Plantar flexion intact No cellulitis present Incision - clean, dry, no drainage, healing Motor Function - intact, moving foot and toes well on exam.   Assessment/Plan: 3 Days Post-Op Procedure(s) (LRB): TOTAL KNEE ARTHROPLASTY (Left) Procedure(s) (LRB): TOTAL KNEE ARTHROPLASTY (Left) Past Medical History  Diagnosis Date  . Acid reflux   . High cholesterol   . Dysrhythmia     PVC's with caffeine and anxiety  . Sleep apnea     no CPAP/ last study  9 yrs ago- "mild per patient"  . Heart murmur     since childhood  . Arthritis   . Hypertension     OV with clearance and note Dr Corliss Blacker 6/13 chart  . Asthma     LOV  6/13  Dr Maple Hudson EPIC/ clearance on chart  from 10/12   Principal Problem:  *OA (osteoarthritis) of knee Active Problems:  Postop Acute blood loss anemia  Postop Hyponatremia   Discharge home with home health Diet - Cardiac diet Follow up - in 2 weeks Activity - WBAT Disposition - Home Condition Upon Discharge - Good D/C Meds - See DC Summary DVT Prophylaxis - Xarelto  Eddrick Dilone 03/21/2012, 8:38 AM

## 2012-03-22 NOTE — Care Management Note (Signed)
    Page 1 of 2   03/22/2012     9:25:17 AM   CARE MANAGEMENT NOTE 03/22/2012  Patient:  [name], [name]   Account Number:  [id number]  Date Initiated:  03/19/2012  Documentation initiated by:  Colleen Can  Subjective/Objective Assessment:   dx osteoaarthritis left knee; total knee replacemnt  Genevieve Norlander referral from MD office     Action/Plan:   CM spoke with patient' Plans are for patient o return to her hone in [facility] where spouse,and sister and daughter will be caregivers. Alrady has potty chair. Has wlker but not sure if it has wheels. She will have spouse bring walker   Anticipated DC Date:  03/21/2012   Anticipated DC Plan:  HOME W HOME HEALTH SERVICES  In-house referral  NA      DC Planning Services  CM consult      [facility] Choice  HOME HEALTH   Choice offered to / List presented to:  C-1 Patient   DME arranged  NA      DME agency  NA     HH arranged  HH-2 PT      [facility] agency  [facility]   Status of service:  Completed, signed off Medicare Important Message given?  NO (If response is "NO", the following Medicare IM given date fields will be blank) Date Medicare IM given:   Date Additional Medicare IM given:    Discharge Disposition:  HOME W HOME HEALTH SERVICES  Per UR Regulation:    If discussed at Long Length of Stay Meetings, dates discussed:    Comments:  03/22/2012 Raynelle Bring BSN CCM [phone] PT DISCHARGED TO HOME WITH [facility] SERVICES IN PLACE/[facility] ARRANGED FOR DME/HH SERVICES TO START 03/22/2012  03/19/2012 Raynelle Bring BSN CCM [phone] Pt plans to use [facility] for [facility] services upon discharge from hospital. List of [facility] agencies placed in shadow chart. CM will follow.Marland Kitchen

## 2012-05-31 ENCOUNTER — Other Ambulatory Visit: Payer: Self-pay | Admitting: Internal Medicine

## 2012-06-06 ENCOUNTER — Other Ambulatory Visit: Payer: Self-pay | Admitting: Internal Medicine

## 2012-06-27 ENCOUNTER — Encounter: Payer: Self-pay | Admitting: Internal Medicine

## 2012-06-27 ENCOUNTER — Ambulatory Visit (INDEPENDENT_AMBULATORY_CARE_PROVIDER_SITE_OTHER): Payer: BC Managed Care – PPO | Admitting: Internal Medicine

## 2012-06-27 VITALS — BP 116/70 | HR 73 | Ht 64.0 in | Wt 156.2 lb

## 2012-06-27 DIAGNOSIS — J45909 Unspecified asthma, uncomplicated: Secondary | ICD-10-CM

## 2012-06-27 DIAGNOSIS — J45998 Other asthma: Secondary | ICD-10-CM

## 2012-06-27 DIAGNOSIS — G4733 Obstructive sleep apnea (adult) (pediatric): Secondary | ICD-10-CM

## 2012-06-27 DIAGNOSIS — Z23 Encounter for immunization: Secondary | ICD-10-CM

## 2012-06-27 MED ORDER — MONTELUKAST SODIUM 10 MG PO TABS: 10.0000 mg | ORAL_TABLET | Freq: Every day | ORAL | Status: DC

## 2012-06-27 MED ORDER — FLUTICASONE-SALMETEROL 100-50 MCG/DOSE IN AEPB: 1.0000 | INHALATION_SPRAY | Freq: Two times a day (BID) | RESPIRATORY_TRACT | Status: DC

## 2012-06-27 MED ORDER — MOMETASONE FUROATE 50 MCG/ACT NA SUSP: 2.0000 | Freq: Every day | NASAL | Status: DC | PRN

## 2012-06-27 MED ORDER — ALBUTEROL SULFATE HFA 108 (90 BASE) MCG/ACT IN AERS: 2.0000 | INHALATION_SPRAY | RESPIRATORY_TRACT | Status: DC | PRN

## 2012-06-27 NOTE — Progress Notes (Signed)
Patient ID: [name], female    DOB: [birth year], 61 y.o.   MRN: [id number]  HPI 11/28/10- 61 yo F never smoker, using Advair 20, Ventolin HFA, Singulair. Last here acutely ill with URI/ bronchitis September 14, 2009. That resolved. She was on augmentin for a sinusitis, ending a week or so ago. She feels completely well now. She would like an antibiotic script to hold. Using Ventolin rescue inhaler rarely since winter bronchitis cleared. Used some before exercise.   06/27/11-61 yo F never smoker, using Advair 20, Ventolin HFA, Singulair. Last here acutely ill with URI/ bronchitis September 14, 2009. Reports fever for a day or 2 after she had both flu and pneumonia vaccines a month ago. Now feels well, noticing some nasal congestion and right maxillary pressure with sneeze. Mostly she asks medication refills. She likes to keep Augmentin available and we discussed this. She starts to wheeze if she skips Advair.  02/28/12- 61 yo F never smoker folowed for asthma, allergic rhinitis, complicated by hx OSA/ oral appliance, HBP, GERD Pt here today for surgery clearance > pt having knee surgery on July 15th 2013/ Dr Despina Hick. Pt state having no issues  at this time Pending left total knee replacement. 3 weeks ago acute bronchitis did not respond to Biaxin so she went to a walk-in clinic and was treated with amoxicillin. Now denies any routine cough or wheeze. We discussed her history of obstructive sleep apnea. She still snores. She never tried CPAP and never got used to an oral appliance. She does not notice daytime sleepiness much, especially since she retired. Continues Advair twice daily, using a rescue inhaler 2 or 3 times per month. PFT 05/30/10- FEV1 2.56/ 91%, FEV1/FVC 0.73, DLCO 84%  within normal limits.  06/27/12-  61 yo F never smoker folowed for asthma, allergic rhinitis, complicated by hx OSA/ quit oral appliance, HBP, GERD Denies any flare ups or attacks since last visit; doing well  overall. Had left total knee replacement and did well with no respiratory complications. No longer treating sleep apnea. She quit using the oral appliance and is not ready to try again.  Review of Systems- see HPI Constitutional:   No-   weight loss, night sweats, fevers, chills, fatigue, lassitude. HEENT:   No-  headaches, difficulty swallowing, tooth/dental problems, sore throat,       No-  sneezing, itching, ear ache, +nasal congestion, post nasal drip,  CV:  No-   chest pain, orthopnea, PND, swelling in lower extremities, anasarca,  dizziness, palpitations Resp: No-   shortness of breath with exertion or at rest.              No-   productive cough,  No non-productive cough,  No- coughing up of blood.              No-   change in color of mucus.  No- wheezing.   Skin: No-   rash or lesions. GI:  No-   heartburn, indigestion, abdominal pain, nausea, vomiting, GU: . MS:  See HPI-  joint pain or swelling.   Neuro-     nothing unusual Psych:  No- change in mood or affect. No depression or anxiety.  No memory loss.  OBJ BP 116/70  Pulse 73  Ht 5\' 4"  (1.626 m)  Wt 156 lb 3.2 oz (70.852 kg)  BMI 26.81 kg/m2  SpO2 100% General- Alert, Oriented, Affect-appropriate, Distress- none acute. Looks well. Skin- rash-none, lesions- none, excoriation- none Lymphadenopathy- none Head-  atraumatic            Eyes- Gross vision intact, PERRLA, conjunctivae clear secretions            Ears- Hearing, canals-normal            Nose- Clear, no-Septal dev, mucus, polyps, erosion, perforation             Throat- Mallampati II-III , mucosa clear , drainage- none, tonsils- atrophic Neck- flexible , trachea midline, no stridor , thyroid nl, carotid no bruit Chest - symmetrical excursion , unlabored           Heart/CV- RRR , no murmur , no gallop  , no rub, nl s1 s2                           - JVD- none , edema- none, stasis changes- none, varices- none           Lung- clear to P&A, wheeze- none, cough-  none , dullness-none, rub- none           Chest wall-  Abd-  Br/ Gen/ Rectal- Not done, not indicated Extrem- cyanosis- none, clubbing, none, atrophy- none, strength- nl Neuro- grossly intact to observation

## 2012-06-27 NOTE — Patient Instructions (Addendum)
Script sent for med refills  Flu vax  Please call as needed

## 2012-06-28 DIAGNOSIS — Z23 Encounter for immunization: Secondary | ICD-10-CM

## 2012-07-02 ENCOUNTER — Other Ambulatory Visit: Payer: Self-pay | Admitting: Internal Medicine

## 2012-07-08 NOTE — Assessment & Plan Note (Signed)
She is not interested in trying other treatments. Diarrhea educated on the importance of weight control and sleeping off flat of back as well as her responsibility to drive safely.

## 2012-07-08 NOTE — Assessment & Plan Note (Signed)
Well controlled at this time. She is satisfied with current meds which we discussed.

## 2012-08-20 ENCOUNTER — Telehealth: Payer: Self-pay | Admitting: Internal Medicine

## 2012-08-20 NOTE — Telephone Encounter (Signed)
Last seen on 06/27/12. Pt c/o sinus pain/pressure x 1 day. Nasal drainage is yellow to green in color. She is using the [employer]. She also c/o left ear pain. Pls advise. Allergies  Allergen Reactions  . Meloxicam Other (See Comments)    Blood count went down   . Nadolol Other (See Comments)    Low blood pressure

## 2012-08-20 NOTE — Telephone Encounter (Signed)
Spoke with patient-will come in to see TP on Thursday at 930am for ? sinus infection. As CY not in office.

## 2012-08-22 ENCOUNTER — Ambulatory Visit: Payer: BC Managed Care – PPO | Admitting: Adult Health

## 2012-09-02 ENCOUNTER — Telehealth: Payer: Self-pay | Admitting: Internal Medicine

## 2012-09-02 NOTE — Telephone Encounter (Signed)
Pt requesting rx for abx to be place on hold just in case she gets sinus infection. Allergies  Allergen Reactions  . Meloxicam Other (See Comments)    Blood count went down   . Nadolol Other (See Comments)    Low blood pressure    Dr Maple Hudson is this ok

## 2012-09-03 MED ORDER — AMOXICILLIN-POT CLAVULANATE 875-125 MG PO TABS: 1.0000 | ORAL_TABLET | Freq: Two times a day (BID) | ORAL | Status: DC

## 2012-09-03 NOTE — Telephone Encounter (Signed)
Rx has been sent in, pt is aware. 

## 2012-09-03 NOTE — Telephone Encounter (Signed)
Ok to Rx augmentin 875 mg, # 14, 1 BID, ref x 4

## 2012-10-03 ENCOUNTER — Ambulatory Visit: Payer: BC Managed Care – PPO | Admitting: Internal Medicine

## 2013-02-24 ENCOUNTER — Encounter: Payer: Self-pay | Admitting: Obstetrics & Gynecology

## 2013-02-24 ENCOUNTER — Ambulatory Visit (INDEPENDENT_AMBULATORY_CARE_PROVIDER_SITE_OTHER): Payer: BC Managed Care – PPO | Admitting: Obstetrics & Gynecology

## 2013-02-24 VITALS — BP 138/88 | HR 60 | Resp 12 | Ht 64.5 in | Wt 181.0 lb

## 2013-02-24 DIAGNOSIS — N898 Other specified noninflammatory disorders of vagina: Secondary | ICD-10-CM

## 2013-02-24 DIAGNOSIS — N952 Postmenopausal atrophic vaginitis: Secondary | ICD-10-CM

## 2013-02-24 MED ORDER — ESTROGENS, CONJUGATED 0.625 MG/GM VA CREA: TOPICAL_CREAM | Freq: Every day | VAGINAL | Status: DC

## 2013-02-24 NOTE — Patient Instructions (Addendum)
Please call if symptoms worsen.

## 2013-02-24 NOTE — Progress Notes (Addendum)
Subjective:     Patient ID: [name], female   DOB: [birth year], 62 y.o.   MRN: [id number]  HPI 62 y.o.MarriedCaucasian female G1P1 with a 3 week(s) history of the following:discharge described as white. Sexually active: yes. Pt also reports the following associated symptoms: urinary frequency and abdominal pressure.  Patient has nottried over the counter treatment.  No vaginal bleeding.  No fevers.  No back pain.  No dysuria. Took one day of antibiotics for dental procedure about three weeks ago.  Review of Systems  All other systems reviewed and are negative.     Objective:   Physical Exam  Constitutional: She appears well-developed and well-nourished.  Abdominal: Soft. Bowel sounds are normal. She exhibits no distension and no mass. There is no tenderness. There is no rebound and no guarding.  Genitourinary: Uterus normal. Uterus is not deviated, not enlarged, not fixed and not tender. Cervix exhibits no motion tenderness, no discharge and no friability. Right adnexum displays no mass, no tenderness and no fullness. Left adnexum displays no mass, no tenderness and no fullness. No erythema, tenderness or bleeding around the vagina. There is a foreign body around the vagina. No signs of injury around the vagina. Vaginal discharge (white and thin, no odor) found.      Wet Prep shows: Ph 5.0, KOH and saline done.   RBCs noted.  WBCs noted.  No yeast.  No clue cells.  Epithelial cells.   Assessment:     Vaginal discharge Atrophy     Plan:     Trial of vaginal estrogen.  Samples given. Will recheck with patient in two to three weeks.

## 2013-03-27 ENCOUNTER — Ambulatory Visit (INDEPENDENT_AMBULATORY_CARE_PROVIDER_SITE_OTHER): Payer: BC Managed Care – PPO | Admitting: Obstetrics & Gynecology

## 2013-03-27 ENCOUNTER — Encounter: Payer: Self-pay | Admitting: Obstetrics and Gynecology

## 2013-03-27 ENCOUNTER — Encounter: Payer: Self-pay | Admitting: Obstetrics & Gynecology

## 2013-03-27 VITALS — BP 124/80 | HR 64 | Resp 20 | Ht 64.5 in | Wt 181.2 lb

## 2013-03-27 DIAGNOSIS — N952 Postmenopausal atrophic vaginitis: Secondary | ICD-10-CM

## 2013-03-27 MED ORDER — ESTROGENS, CONJUGATED 0.625 MG/GM VA CREA: TOPICAL_CREAM | VAGINAL | Status: DC

## 2013-03-27 NOTE — Progress Notes (Signed)
Subjective:     Patient ID: [name], female   DOB: June 29, 1951, 62 y.o.   MRN: [id number]  HPI 62 yo G1P1 MWF with history of vaginal discharge she thought was yeast.  She was seen 02/24/13.  I felt this was atrophic vaginitis and she was started on vaginal estrogen.  She reports discharge is much better. No side effects from medication  Review of Systems  All other systems reviewed and are negative.       Objective:   Physical Exam  Constitutional: She appears well-developed and well-nourished.  Genitourinary: Vagina normal and uterus normal. Cervix exhibits no discharge. No erythema, tenderness or bleeding around the vagina. No foreign body around the vagina. No vaginal discharge found.       Assessment:     Atrophic vaginitis, improved     Plan:     Continue Vaginal estrogen (switching to Premarin due to pharmacy benefits) 0.5mg  twice weekly.  #30 g/2RF. AEX scheduled 4/15.

## 2013-03-27 NOTE — Patient Instructions (Signed)
Please call if you have any changes/new issues.

## 2013-05-16 ENCOUNTER — Ambulatory Visit (INDEPENDENT_AMBULATORY_CARE_PROVIDER_SITE_OTHER): Payer: BC Managed Care – PPO | Admitting: Emergency Medicine

## 2013-05-16 VITALS — BP 126/82 | HR 78 | Temp 97.5°F | Resp 18 | Ht 64.5 in | Wt 183.4 lb

## 2013-05-16 DIAGNOSIS — N898 Other specified noninflammatory disorders of vagina: Secondary | ICD-10-CM

## 2013-05-16 DIAGNOSIS — Z209 Contact with and (suspected) exposure to unspecified communicable disease: Secondary | ICD-10-CM

## 2013-05-16 LAB — POCT WET PREP WITH KOH
KOH Prep POC: NEGATIVE
Trichomonas, UA: NEGATIVE

## 2013-05-16 LAB — POCT CBC
Lymph, poc: 1.8 (ref 0.6–3.4)
MCH, POC: 25.9 pg — AB (ref 27–31.2)
MCHC: 31.7 g/dL — AB (ref 31.8–35.4)
MID (cbc): 0.5 (ref 0–0.9)
MPV: 8.9 fL (ref 0–99.8)
POC Granulocyte: 2.2 (ref 2–6.9)
POC MID %: 11.4 %M (ref 0–12)
Platelet Count, POC: 313 10*3/uL (ref 142–424)
RDW, POC: 15.8 %
WBC: 4.5 10*3/uL — AB (ref 4.6–10.2)

## 2013-05-16 MED ORDER — METRONIDAZOLE 500 MG PO TABS: 500.0000 mg | ORAL_TABLET | Freq: Two times a day (BID) | ORAL | Status: DC

## 2013-05-16 NOTE — Progress Notes (Signed)
  Subjective:    Patient ID: [name], female    DOB: 09-12-1950, 62 y.o.   MRN: [id number]  HPI patient states she has been to her doctor with a vaginal discharge. She enters today with persistent vaginal discharge apparently she has been sexually with her husband and also with another partner. Her other partner told her he "had any infection that responded to antibiotics but did not tell her specifically what that was.    Review of Systems     Objective:   Physical Exam HEENT exam is unremarkable neck was supple chest was clear heart regular rate no murmurs abdomen soft nontender GU exam reveals a thick yellowish discharge in the vaginal vault. Cervix shows some mild erosions there is no cervical tenderness there are no adnexal masses palpable on exam  Results for orders placed in visit on 05/16/13  POCT CBC      Result Value Range   WBC 4.5 (*) 4.6 - 10.2 K/uL   Lymph, poc 1.8  0.6 - 3.4   POC LYMPH PERCENT 39.8  10 - 50 %L   MID (cbc) 0.5  0 - 0.9   POC MID % 11.4  0 - 12 %M   POC Granulocyte 2.2  2 - 6.9   Granulocyte percent 48.8  37 - 80 %G   RBC 5.33  4.04 - 5.48 M/uL   Hemoglobin 13.8  12.2 - 16.2 g/dL   HCT, POC 11.9  14.7 - 47.9 %   MCV 81.6  80 - 97 fL   MCH, POC 25.9 (*) 27 - 31.2 pg   MCHC 31.7 (*) 31.8 - 35.4 g/dL   RDW, POC 82.9     Platelet Count, POC 313  142 - 424 K/uL   MPV 8.9  0 - 99.8 fL  POCT WET PREP WITH KOH      Result Value Range   Trichomonas, UA Negative     Clue Cells Wet Prep HPF POC neg     Epithelial Wet Prep HPF POC 10+     Yeast Wet Prep HPF POC neg     Bacteria Wet Prep HPF POC moderate     RBC Wet Prep HPF POC neg     WBC Wet Prep HPF POC TNTC     KOH Prep POC Negative          Assessment & Plan:  Patient did have a significant discharge. Testing for gonorrhea and Chlamydia were done. She did have too numerous to count white cells moderate bacteria. Go ahead and treat with Flagyl for one week pending her culture results

## 2013-05-17 LAB — RPR

## 2013-05-17 LAB — HEPATITIS C ANTIBODY: HCV Ab: NEGATIVE

## 2013-05-17 LAB — HIV ANTIBODY (ROUTINE TESTING W REFLEX): HIV: NONREACTIVE

## 2013-05-20 LAB — HSV(HERPES SIMPLEX VRS) I + II AB-IGG: HSV 1 Glycoprotein G Ab, IgG: <0.1 IV

## 2013-06-08 IMAGING — CR DG CHEST 2V
2 series · 2 of 2 positions shown · non-contrast
Comparison: 04/24/2010

CLINICAL DATA: Preop for left knee arthroplasty.  History of
asthma.  Hypertension.

CHEST - 2 VIEW

[w chest pa]
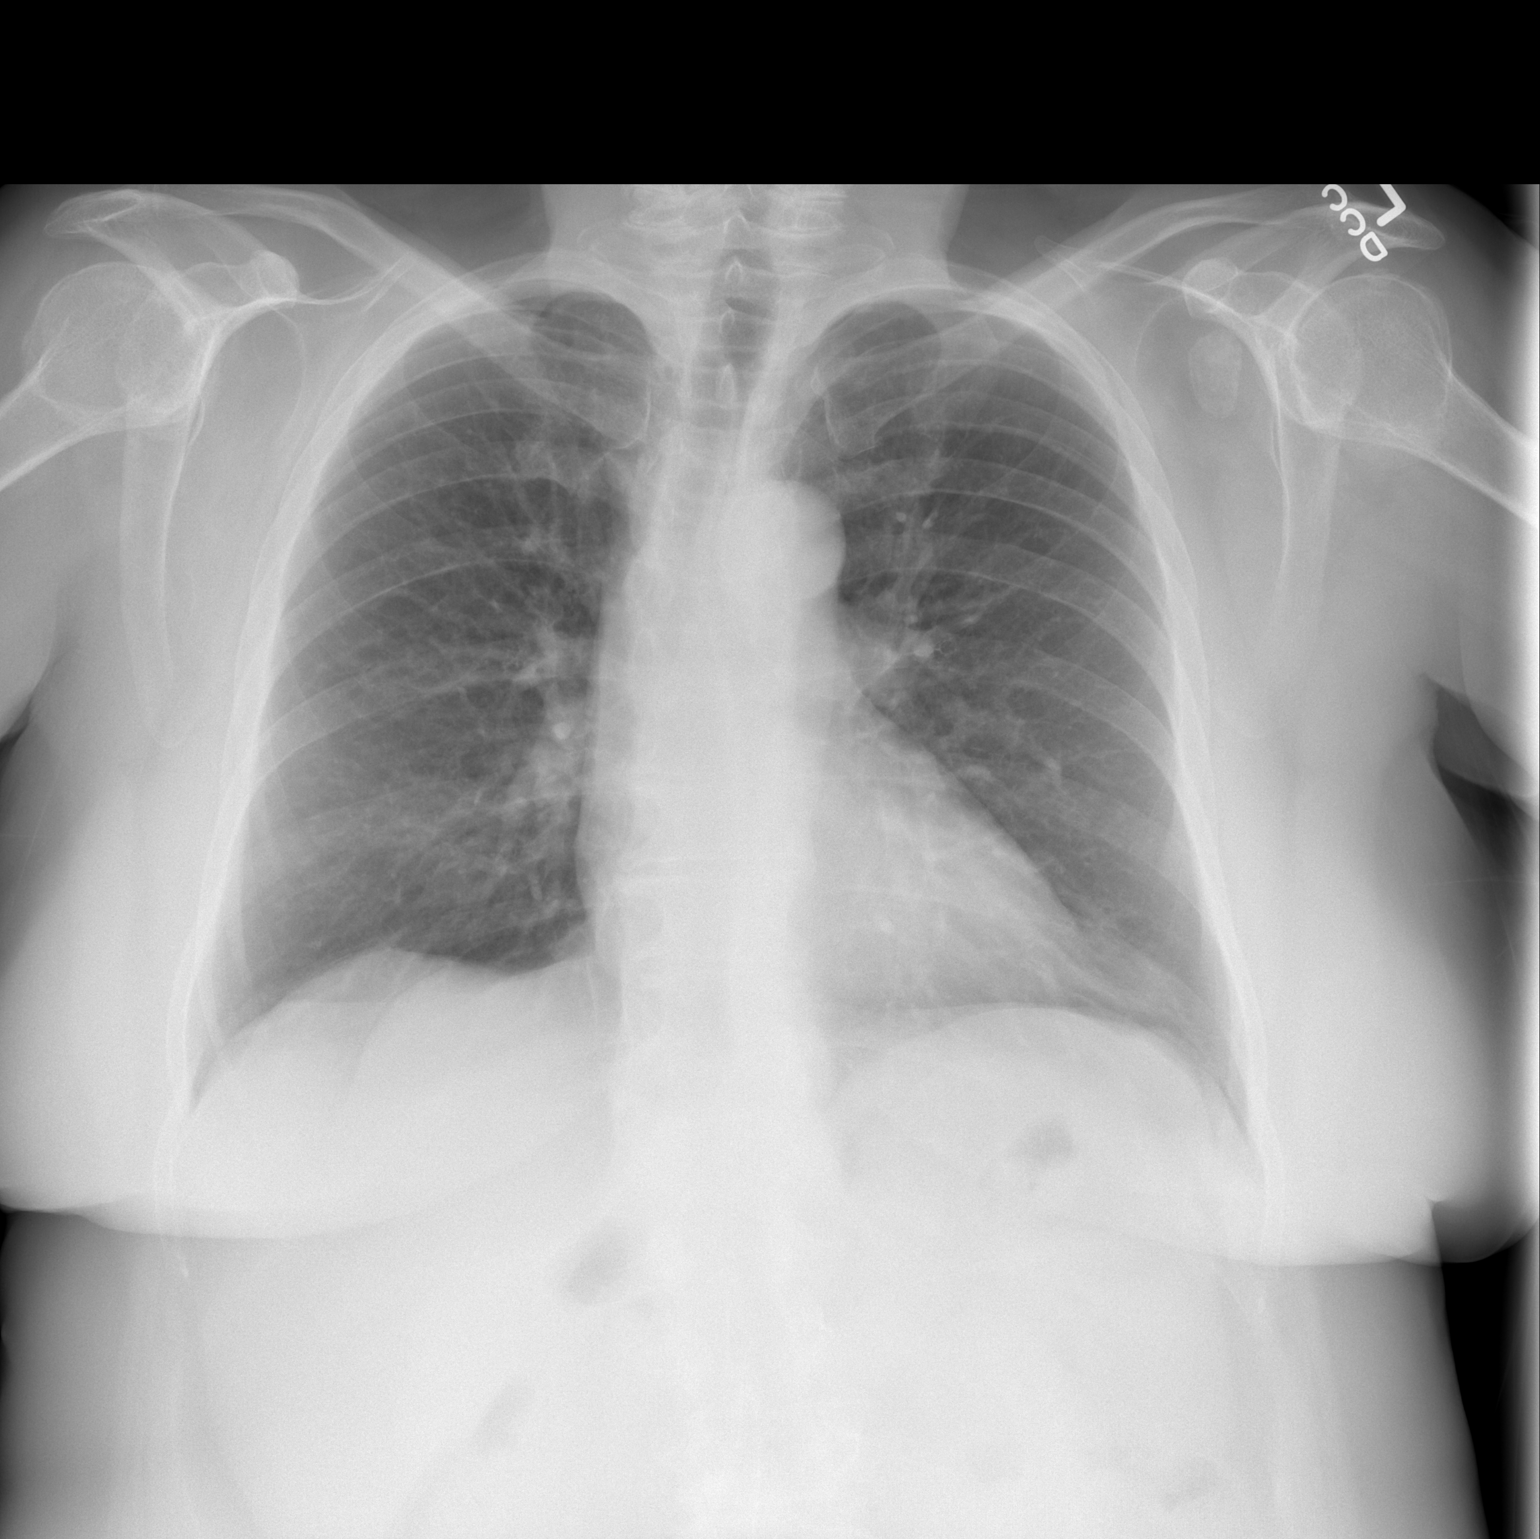

[w chest lat]
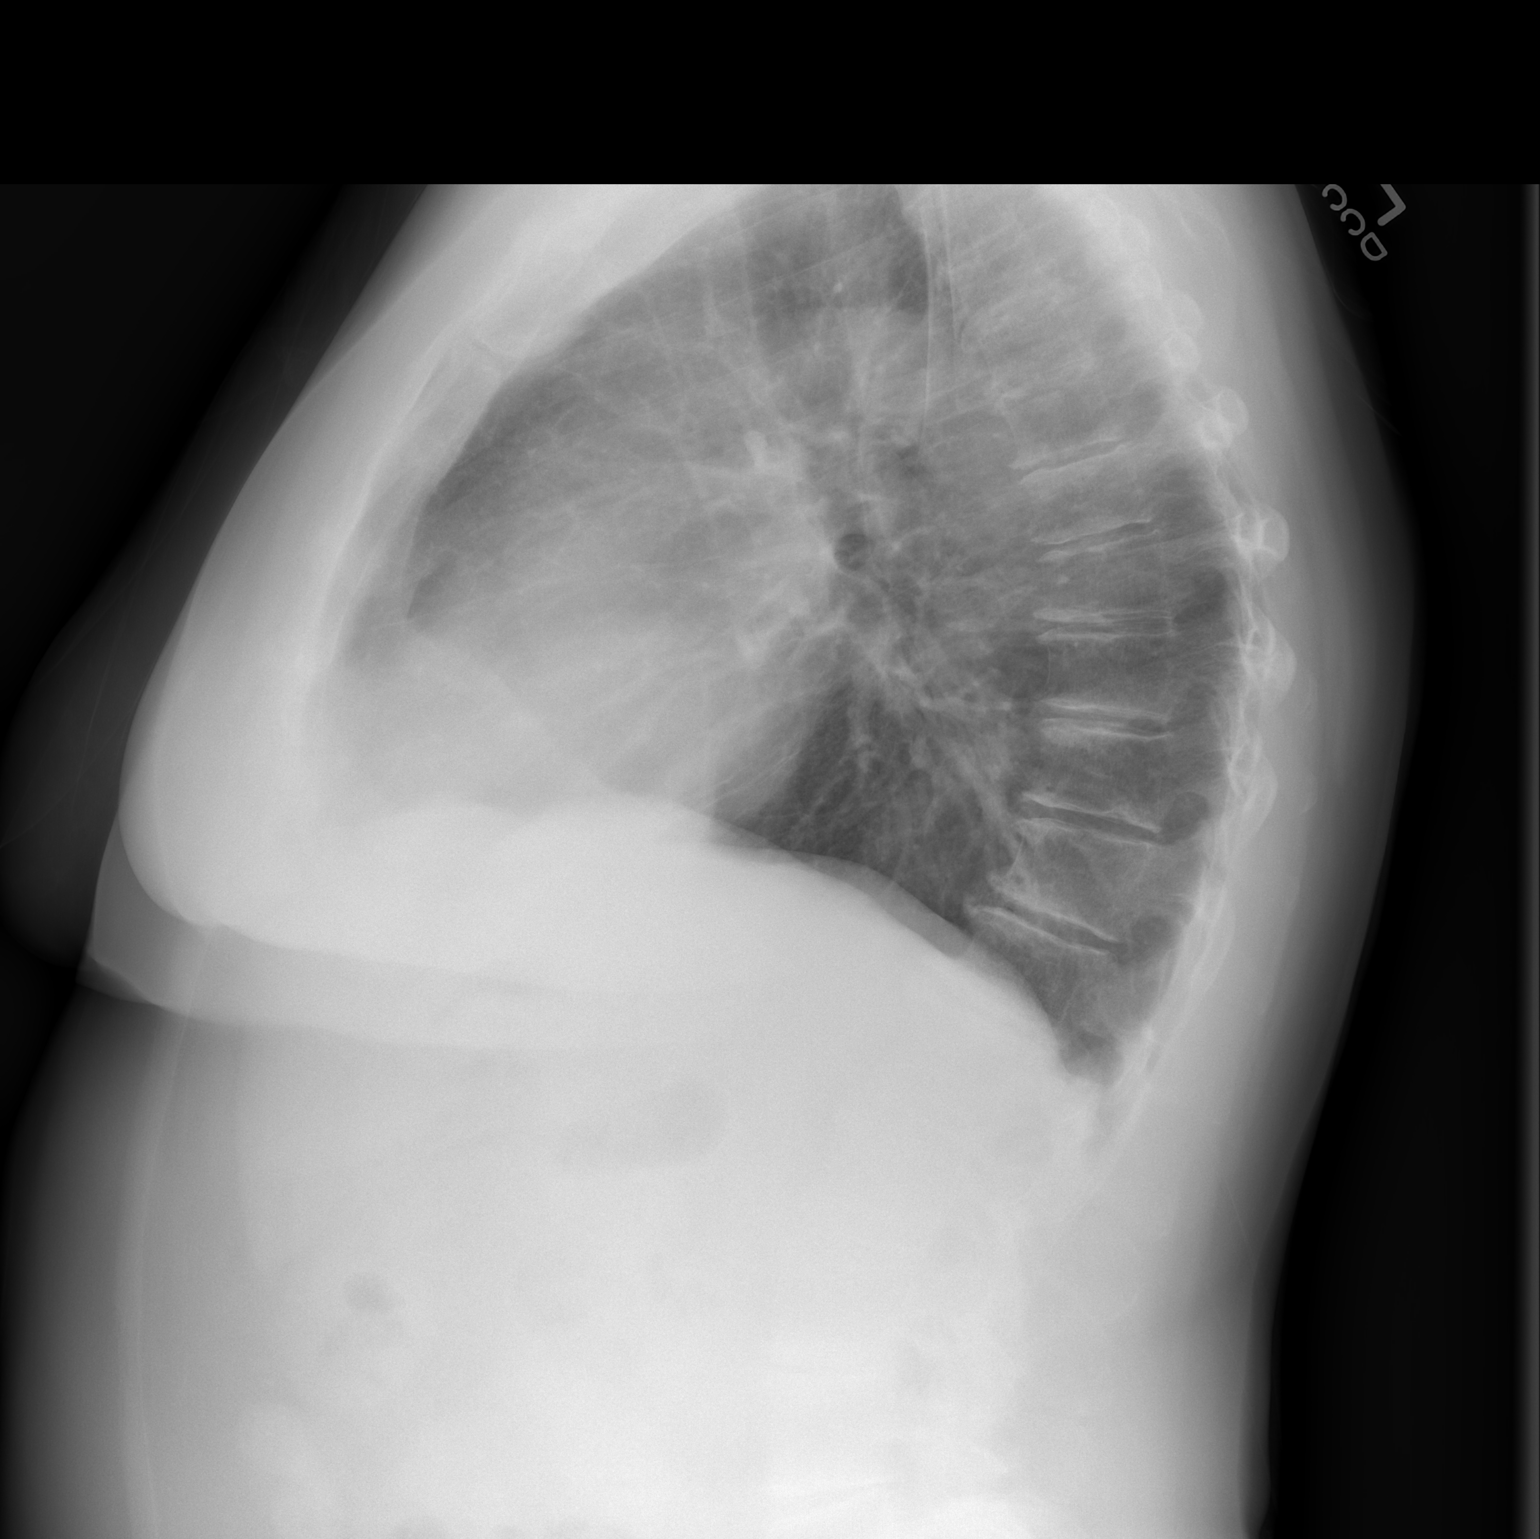

[2 of 2 positions shown; findings below may reference images not displayed]

FINDINGS: There is a probable loose body within the left
glenohumeral joint.  2.5 cm.

Midline trachea.  Normal heart size and mediastinal contours.
Minimal convex right thoracic spine curvature. No pleural effusion
or pneumothorax.  Similar vague increased density along the left
heart border on the frontal.  Possibly mild volume loss in the
lingula or a prominent epicardial fat pad.
IMPRESSION: No acute cardiopulmonary disease.

## 2013-06-16 ENCOUNTER — Other Ambulatory Visit: Payer: Self-pay | Admitting: Obstetrics & Gynecology

## 2013-06-16 NOTE — Telephone Encounter (Signed)
Patient was seen for OV 03/27/13 #30/2 refills was sent   AEX 12/11/13   Please advise okay to send refills till then?

## 2013-06-17 NOTE — Telephone Encounter (Signed)
Can you please check with pt about usage.  If she is using 1/2 gram twice weekly, the tube should last almost four months.

## 2013-06-18 NOTE — Telephone Encounter (Signed)
Patient says she uses the premarin as directed 1/2 gram twice weekly only when she needs it, she says maybe it must be on a automatic refill

## 2013-06-18 NOTE — Telephone Encounter (Signed)
So RF not needed.  No orders placed.  Closed communication.

## 2013-06-18 NOTE — Telephone Encounter (Signed)
Left Message To Call Back  

## 2013-06-26 ENCOUNTER — Ambulatory Visit: Payer: BC Managed Care – PPO | Admitting: Internal Medicine

## 2013-07-04 ENCOUNTER — Other Ambulatory Visit: Payer: Self-pay | Admitting: Internal Medicine

## 2013-07-04 NOTE — Telephone Encounter (Signed)
Please advise if okay to refill; not on med list and patient was due for 1 year ROV 06/27/13. Thanks.

## 2013-07-06 NOTE — Telephone Encounter (Signed)
Ok to refill for a year? 

## 2013-07-08 ENCOUNTER — Telehealth: Payer: Self-pay | Admitting: Internal Medicine

## 2013-07-08 NOTE — Telephone Encounter (Signed)
lmomtcb x1 for pt 

## 2013-07-10 ENCOUNTER — Encounter: Payer: Self-pay | Admitting: Internal Medicine

## 2013-07-10 ENCOUNTER — Encounter (INDEPENDENT_AMBULATORY_CARE_PROVIDER_SITE_OTHER): Payer: Self-pay

## 2013-07-10 ENCOUNTER — Ambulatory Visit (INDEPENDENT_AMBULATORY_CARE_PROVIDER_SITE_OTHER): Payer: BC Managed Care – PPO | Admitting: Internal Medicine

## 2013-07-10 VITALS — BP 112/76 | HR 78 | Ht 64.0 in | Wt 183.6 lb

## 2013-07-10 DIAGNOSIS — J309 Allergic rhinitis, unspecified: Secondary | ICD-10-CM

## 2013-07-10 DIAGNOSIS — J45909 Unspecified asthma, uncomplicated: Secondary | ICD-10-CM

## 2013-07-10 DIAGNOSIS — G4733 Obstructive sleep apnea (adult) (pediatric): Secondary | ICD-10-CM

## 2013-07-10 DIAGNOSIS — J45998 Other asthma: Secondary | ICD-10-CM

## 2013-07-10 MED ORDER — ALBUTEROL SULFATE HFA 108 (90 BASE) MCG/ACT IN AERS: 2.0000 | INHALATION_SPRAY | RESPIRATORY_TRACT | Status: DC | PRN

## 2013-07-10 MED ORDER — AMOXICILLIN-POT CLAVULANATE 875-125 MG PO TABS: 1.0000 | ORAL_TABLET | Freq: Two times a day (BID) | ORAL | Status: DC

## 2013-07-10 MED ORDER — MONTELUKAST SODIUM 10 MG PO TABS: ORAL_TABLET | ORAL | Status: DC

## 2013-07-10 MED ORDER — FLUTICASONE-SALMETEROL 100-50 MCG/DOSE IN AEPB: INHALATION_SPRAY | RESPIRATORY_TRACT | Status: DC

## 2013-07-10 MED ORDER — MOMETASONE FUROATE 50 MCG/ACT NA SUSP: 2.0000 | Freq: Every day | NASAL | Status: DC | PRN

## 2013-07-10 NOTE — Telephone Encounter (Signed)
Refill sent. Pt is aware. Mariadejesus Cade, CMA  

## 2013-07-10 NOTE — Progress Notes (Signed)
Patient ID: Candace Cruise, female    DOB: 1950/09/23, 62 y.o.   MRN: [id number]  HPI 11/28/10- 33 yo F never smoker, using Advair 20, Ventolin HFA, Singulair. Last here acutely ill with URI/ bronchitis September 14, 2009. That resolved. She was on augmentin for a sinusitis, ending a week or so ago. She feels completely well now. She would like an antibiotic script to hold. Using Ventolin rescue inhaler rarely since winter bronchitis cleared. Used some before exercise.   06/27/11-59 yo F never smoker, using Advair 20, Ventolin HFA, Singulair. Last here acutely ill with URI/ bronchitis September 14, 2009. Reports fever for a day or 2 after she had both flu and pneumonia vaccines a month ago. Now feels well, noticing some nasal congestion and right maxillary pressure with sneeze. Mostly she asks medication refills. She likes to keep Augmentin available and we discussed this. She starts to wheeze if she skips Advair.  02/28/12- 33 yo F never smoker folowed for asthma, allergic rhinitis, complicated by hx OSA/ oral appliance, HBP, GERD Pt here today for surgery clearance > pt having knee surgery on July 15th 2013/ Dr Despina Hick. Pt state having no issues  at this time Pending left total knee replacement. 3 weeks ago acute bronchitis did not respond to Biaxin so she went to a walk-in clinic and was treated with amoxicillin. Now denies any routine cough or wheeze. We discussed her history of obstructive sleep apnea. She still snores. She never tried CPAP and never got used to an oral appliance. She does not notice daytime sleepiness much, especially since she retired. Continues Advair twice daily, using a rescue inhaler 2 or 3 times per month. PFT 05/30/10- FEV1 2.56/ 91%, FEV1/FVC 0.73, DLCO 84%  within normal limits.  06/27/12-  57 yo F never smoker folowed for asthma, allergic rhinitis, complicated by hx OSA/ quit oral appliance, HBP, GERD Denies any flare ups or attacks since last visit; doing well  overall. Had left total knee replacement and did well with no respiratory complications. No longer treating sleep apnea. She quit using the oral appliance and is not ready to try again.  07/10/13-  67 yo F never smoker folowed for asthma, allergic rhinitis, complicated by hx OSA/ quit oral appliance, HBP, GERD Follows For: Runny nose, sneezing, itchy throat , occas wheezing - Denies cough or sob Persistent mild tightness, not really wheezy. Some itching and sneezing. Medication talk. Gets flu vaccine at primary physician. Asks about an update sleep study. She had documented sleep apnea in the past. She did not tolerate CPAP or an oral appliance. Aware of snoring and daytime tiredness.   Review of Systems- see HPI Constitutional:   No-   weight loss, night sweats, fevers, chills, +fatigue, lassitude. HEENT:   No-  headaches, difficulty swallowing, tooth/dental problems, sore throat,       No-  sneezing, itching, ear ache, +nasal congestion, post nasal drip,  CV:  No-   chest pain, orthopnea, PND, swelling in lower extremities, anasarca,  dizziness, palpitations Resp: No-   shortness of breath with exertion or at rest.              No-   productive cough,  No non-productive cough,  No- coughing up of blood.              No-   change in color of mucus.  No- wheezing.   Skin: No-   rash or lesions. GI:  No-   heartburn, indigestion, abdominal  pain, nausea, vomiting, GU: . MS:  See HPI-  joint pain or swelling.   Neuro-     nothing unusual Psych:  No- change in mood or affect. No depression or anxiety.  No memory loss.  OBJ General- Alert, Oriented, Affect-appropriate, Distress- none acute. Looks well. Skin- rash-none, lesions- none, excoriation- none Lymphadenopathy- none Head- atraumatic            Eyes- Gross vision intact, PERRLA, conjunctivae clear secretions            Ears- Hearing, canals-normal            Nose- Clear, no-Septal dev, +mucus bridging, no-polyps, erosion, perforation              Throat- Mallampati II-III , mucosa clear , drainage- none, tonsils- atrophic Neck- flexible , trachea midline, no stridor , thyroid nl, carotid no bruit Chest - symmetrical excursion , unlabored           Heart/CV- RRR , no murmur , no gallop  , no rub, nl s1 s2                           - JVD- none , edema- none, stasis changes- none, varices- none           Lung- clear to P&A, wheeze- none, cough- none , dullness-none, rub- none           Chest wall-  Abd-  Br/ Gen/ Rectal- Not done, not indicated Extrem- cyanosis- none, clubbing, none, atrophy- none, strength- nl Neuro- grossly intact to observation

## 2013-07-10 NOTE — Patient Instructions (Signed)
Script for [employer]  Try sample Dymista for comparison-  1-2 puffs each nostril once daily at bedtime. Try this for comparison instead of nasonex. Go back to nasonex when through with the sample of Dymista  Scripts sent for your other routine meds

## 2013-07-24 NOTE — Assessment & Plan Note (Signed)
We discussed availability of an unattended home sleep study and she is considering this

## 2013-07-24 NOTE — Assessment & Plan Note (Signed)
Chronic mild intermittent, fair control. Plan-refill Augmentin to hold. Reminded not to forget flu shot.

## 2013-07-24 NOTE — Assessment & Plan Note (Signed)
Seasonal increase in symptoms. Plan-Dymista

## 2013-08-08 ENCOUNTER — Telehealth: Payer: Self-pay | Admitting: Internal Medicine

## 2013-08-08 NOTE — Telephone Encounter (Signed)
Per [facility] OV instructions: Script for Nasonex printed  Try sample Dymista for comparison- 1-2 puffs each nostril once daily at bedtime. Try this for comparison instead of nasonex. Go back to nasonex when through with the sample of Dymista    [id number].Carron Curie, CMA\

## 2013-08-11 NOTE — Telephone Encounter (Signed)
Called spoke with patient who reported that she completed the Dymista sample and is now back on the Nasonex.  However, she does feel like the Dymista worked better for her.  Pt stated that CDY told her to try the Dymista and call for rx if she preferred it.  Dr Maple Hudson please advise, thank you.

## 2013-08-11 NOTE — Telephone Encounter (Signed)
Ok to Rx Dymista nasal spray  #1, 1-2 puffs each nostril daily at bedtime.  If insurance won't cover, we will give separate scripts for the components Astelin and Flonase.

## 2013-08-11 NOTE — Telephone Encounter (Signed)
lmomtcb x1 for pt 

## 2013-08-11 NOTE — Telephone Encounter (Signed)
lmtcb x2 with spouse

## 2013-08-11 NOTE — Telephone Encounter (Signed)
Returning call can be reached at [phone].[name]

## 2013-08-12 MED ORDER — AZELASTINE-FLUTICASONE 137-50 MCG/ACT NA SUSP: NASAL | Status: DC

## 2013-08-12 NOTE — Telephone Encounter (Signed)
lmomtcb x2 for pt 

## 2013-08-12 NOTE — Telephone Encounter (Signed)
Pt states that she is only available for another hour at [phone] & then will not be available again until after 5:30.  [name]

## 2013-08-12 NOTE — Telephone Encounter (Signed)
Pt aware of recs and rx sent. Nothing further needed 

## 2013-08-14 ENCOUNTER — Telehealth: Payer: Self-pay | Admitting: Obstetrics & Gynecology

## 2013-08-14 ENCOUNTER — Encounter: Payer: Self-pay | Admitting: Certified Nurse Midwife

## 2013-08-14 ENCOUNTER — Ambulatory Visit (INDEPENDENT_AMBULATORY_CARE_PROVIDER_SITE_OTHER): Payer: BC Managed Care – PPO | Admitting: Certified Nurse Midwife

## 2013-08-14 VITALS — BP 104/62 | HR 76 | Temp 98.6°F | Resp 20 | Ht 64.25 in | Wt 181.0 lb

## 2013-08-14 DIAGNOSIS — N390 Urinary tract infection, site not specified: Secondary | ICD-10-CM

## 2013-08-14 DIAGNOSIS — A6009 Herpesviral infection of other urogenital tract: Secondary | ICD-10-CM

## 2013-08-14 DIAGNOSIS — N898 Other specified noninflammatory disorders of vagina: Secondary | ICD-10-CM

## 2013-08-14 DIAGNOSIS — A6000 Herpesviral infection of urogenital system, unspecified: Secondary | ICD-10-CM

## 2013-08-14 LAB — POCT URINALYSIS DIPSTICK
Bilirubin, UA: NEGATIVE
Glucose, UA: NEGATIVE
Ketones, UA: NEGATIVE
Nitrite, UA: NEGATIVE

## 2013-08-14 MED ORDER — VALACYCLOVIR HCL 500 MG PO TABS: ORAL_TABLET | ORAL | Status: DC

## 2013-08-14 NOTE — Telephone Encounter (Signed)
Patient si having burning when she urinates and goes to sit down. Wants to come in and be seen today if possible okay to see NP.

## 2013-08-14 NOTE — Progress Notes (Signed)
62 y.o.MarriedCaucasian female G1P1 with a 3 day(s) history of the following:bumps, burning, discharge described as odorless and milky, pain and vulvar itching Sexually active: yes Last sexual activity:4days ago. Pt also reports the following associated symptoms: burning with urination when urine touches skin only. Patient has tried over the counter treatment with no relief. Patient reports spouse who she is not with now has history of HSV 1 and prior sexual activity included oral as well. Patient had sexual activity with other partner 4 days ago and never felt right afterward. No personal history of HSV. Declines other STD screening."Feels like the worst yeast infection  I have ever had. Denies new personal products. Patient has also been on Augmentin for URI.  O:Healthy WDWN female Affect: normal, orientation  X 3 Abdomen: non tender Negative CVAT bilateral    Exam:  WUJ:[id number], Urethra, Skene's normal, Increased pink with numerous herpetic lesions on vulva bilateral, tender, culture taken                FAO:ZHYQMVHQI: watery and odorless, pH 4.0, wet prep done numerous herpetic blisters noted inside introitus, tender, culture taken                Cx:  normal appearance and non tender                Uterus:non-tender, normal shape and consistency                Adnexa: normal adnexa and no mass, fullness, tenderness  Wet Prep shows:negative for pathogens  Poct urine-wbc 1+,rbc trace  A: 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 Etiology discussed at length. Questions addressed at length. Discussed transmission and treatment opitons Aveeno Sitz bath for comfort. Rx Valtrex see order  Herpes culture, Lab HSV 1,11 Encouraged STD screening at recheck  Rv 1 week., prn

## 2013-08-14 NOTE — Telephone Encounter (Signed)
Spoke with pt who first noticed some burning with urination and while sitting about 5 days ago. Pt also having discharge and some anal area pain. Pt saw PCP Dr. Corliss Blacker Monday for sinus infection and is taking augmentin. Sched OV with DL today at 96:04 per pt request.

## 2013-08-15 LAB — HSV(HERPES SIMPLEX VRS) I + II AB-IGG
HSV 1 Glycoprotein G Ab, IgG: 0.12 IV
HSV 2 Glycoprotein G Ab, IgG: <0.1 IV

## 2013-08-19 ENCOUNTER — Telehealth: Payer: Self-pay

## 2013-08-19 NOTE — Progress Notes (Signed)
Reviewed personally.  M. Suzanne Cynethia Schindler, MD.  

## 2013-08-19 NOTE — Telephone Encounter (Signed)
Message copied by Eliezer Bottom on Tue Aug 19, 2013  9:50 AM ------      Message from: Verner Chol      Created: Tue Aug 19, 2013  8:29 AM       Notify patient that blood tests for Herpes negative, but culture positive will discuss with her at visit tomorrow 08/20/13 ------

## 2013-08-19 NOTE — Telephone Encounter (Signed)
Patient notified of results.

## 2013-08-19 NOTE — Telephone Encounter (Signed)
lmtcb

## 2013-08-20 ENCOUNTER — Ambulatory Visit (INDEPENDENT_AMBULATORY_CARE_PROVIDER_SITE_OTHER): Payer: BC Managed Care – PPO | Admitting: Certified Nurse Midwife

## 2013-08-20 ENCOUNTER — Encounter: Payer: Self-pay | Admitting: Certified Nurse Midwife

## 2013-08-20 VITALS — BP 110/64 | HR 68 | Resp 16 | Ht 64.25 in | Wt 180.0 lb

## 2013-08-20 DIAGNOSIS — Z202 Contact with and (suspected) exposure to infections with a predominantly sexual mode of transmission: Secondary | ICD-10-CM

## 2013-08-20 DIAGNOSIS — A6009 Herpesviral infection of other urogenital tract: Secondary | ICD-10-CM

## 2013-08-20 DIAGNOSIS — A6000 Herpesviral infection of urogenital system, unspecified: Secondary | ICD-10-CM

## 2013-08-20 LAB — HEPATITIS C ANTIBODY: HCV Ab: NEGATIVE

## 2013-08-20 LAB — HIV ANTIBODY (ROUTINE TESTING W REFLEX): HIV: NONREACTIVE

## 2013-08-20 MED ORDER — VALACYCLOVIR HCL 500 MG PO TABS: ORAL_TABLET | ORAL | Status: DC

## 2013-08-20 NOTE — Progress Notes (Signed)
62 y.o. Married Caucasian female G1P1 here for follow up of Herpes genitalis  treated with Valtrex initiated on 08/14/13. Taking medication as directed.  Denies any problems with medication use. Patient has also being using Aveeno bath for comfort. Complaining of pain with bowel movement due to HSV blisters in that area, so has started on Miralax daily. This is working well. HSV blisters subsiding, pain is improved. No vaginal discharge concerns.Was given lab results by phone has questions. Desires STD screening as we recommended. No other issues today.   O: Healthy WD,WN female Affect: normal, orientation x 3  Skin: Warm and dry Lymph nodes: inguinal no enlargement or tenderness Pelvic exam:EXTERNAL GENITALIA: atrophic appearing vulva with no masses, tenderness or lesions, pink where blisters are resolving VAGINA: no abnormal discharge or lesions and herpetic blisters resolved in vagina, atrophic appearance CERVIX: no lesions or cervical motion tenderness and herpetic blisters resolved on cervix UTERUS: anteverted and non tender, normal ADNEXA: no masses palpable and nontender Perineal are: Herpetic open blisters still present, not tender, no evidence of any other issues.  A: HSV 2 initial outbreak resolving with good response to Valtrex use STD screening Menopausal no HRT Atrophic vaginitis with Estrace cream use   P: Discussed findings of HSV resolving. Patient viewed area in mirror again from previous exam. Discussed completing Valtrex and starting on suppression dose to decrease Reoccurrence and shedding. Questions addressed regarding etiology, transmission,and treatment discussed at length. Patient plans to discuss with spouse, given handout on Herpes. Lab:GC,Chlamydia, HIV,RPR,Hepatitis C Patient desires suppression with Valtrex. Rx Valtrex see order. Patient instructed to monitor outbreak reoccurrence and advise. If unsure of outbreak schedule OV  RV prn   35 minutes spent with  patient with >50% of time spent in face to face counseling.

## 2013-08-21 NOTE — Progress Notes (Signed)
Reviewed personally.  M. Suzanne Lillianna Sabel, MD.  

## 2013-08-22 LAB — IPS N GONORRHOEA AND CHLAMYDIA BY PCR

## 2013-08-25 ENCOUNTER — Telehealth: Payer: Self-pay

## 2013-08-25 NOTE — Telephone Encounter (Signed)
lmtcb

## 2013-08-25 NOTE — Telephone Encounter (Signed)
Message copied by Eliezer Bottom on Mon Aug 25, 2013  2:17 PM ------      Message from: Verner Chol      Created: Mon Aug 25, 2013  8:24 AM       Notify GC, Chlamydia, HIV,RPR all negative ------

## 2013-08-26 NOTE — Telephone Encounter (Signed)
Patient notified of results.

## 2013-08-27 ENCOUNTER — Telehealth: Payer: Self-pay | Admitting: Internal Medicine

## 2013-08-27 MED ORDER — FLUTICASONE PROPIONATE 50 MCG/ACT NA SUSP: NASAL | Status: DC

## 2013-08-27 MED ORDER — AZELASTINE HCL 0.1 % NA SOLN: NASAL | Status: DC

## 2013-08-27 NOTE — Telephone Encounter (Signed)
New RX's sent to pharmacy and pt aware.

## 2013-09-17 ENCOUNTER — Ambulatory Visit: Payer: Self-pay | Admitting: Gynecology

## 2013-10-03 ENCOUNTER — Other Ambulatory Visit: Payer: Self-pay | Admitting: Gynecology

## 2013-10-03 NOTE — Telephone Encounter (Signed)
Last AEX and refill 09/16/2012 - refill for 1 year Next appt 12/11/2013.  Will refill for 2 months until appt.

## 2013-10-06 ENCOUNTER — Telehealth: Payer: Self-pay | Admitting: Certified Nurse Midwife

## 2013-10-06 DIAGNOSIS — A6009 Herpesviral infection of other urogenital tract: Secondary | ICD-10-CM

## 2013-10-06 MED ORDER — VALACYCLOVIR HCL 500 MG PO TABS: ORAL_TABLET | ORAL | Status: DC

## 2013-10-06 MED ORDER — VALACYCLOVIR HCL 500 MG PO TABS
500.0000 mg | ORAL_TABLET | Freq: Two times a day (BID) | ORAL | Status: DC
Start: 2013-10-06 — End: 2014-01-01

## 2013-10-06 NOTE — Telephone Encounter (Signed)
Patient has HSV outbreak and increased her daily dose to two daily as instructed but now RX will run out before insurance will allow refill.  Last AEX 09-2012 with Dr Charlies Constable and is scheduled for 11-2013 with Dr Sabra Heck.  Has been seen for problems visits, was given Valtrex, 30 tabs for daily use and outbreaks.  The BID dosing is helping outbreak but she is out and need refill.  Advised will send one RX with BID dosing and the next month can increase quantity to 45 tabs for one month.  Since outbreak resolving, does not need prob OV tomm.  Appt canceled.  Routing to provider for final review. Patient agreeable to disposition. Will close encounter

## 2013-10-06 NOTE — Telephone Encounter (Signed)
Patient if having a herpes outbreak and scheduled an appointment with D. Hollice Espy 10/07/13. She wanted to schedule the appointment then speak with the nurse and cancel appointment if not needed. She "may just need a refill" on her "current medication."

## 2013-10-07 ENCOUNTER — Ambulatory Visit: Payer: BC Managed Care – PPO | Admitting: Certified Nurse Midwife

## 2013-11-27 ENCOUNTER — Other Ambulatory Visit: Payer: Self-pay | Admitting: Obstetrics & Gynecology

## 2013-11-27 NOTE — Telephone Encounter (Signed)
Last AEX and refill 09/16/2012 - refill for 1 year  Next appt 12/11/2013  Will refill once until appt.

## 2013-12-11 ENCOUNTER — Ambulatory Visit: Payer: Self-pay | Admitting: Obstetrics & Gynecology

## 2014-01-01 ENCOUNTER — Ambulatory Visit (INDEPENDENT_AMBULATORY_CARE_PROVIDER_SITE_OTHER): Payer: BC Managed Care – PPO | Admitting: Obstetrics & Gynecology

## 2014-01-01 ENCOUNTER — Encounter: Payer: Self-pay | Admitting: Obstetrics & Gynecology

## 2014-01-01 VITALS — BP 134/80 | HR 60 | Resp 12 | Ht 64.5 in | Wt 183.6 lb

## 2014-01-01 DIAGNOSIS — Z01419 Encounter for gynecological examination (general) (routine) without abnormal findings: Secondary | ICD-10-CM

## 2014-01-01 DIAGNOSIS — IMO0002 Reserved for concepts with insufficient information to code with codable children: Secondary | ICD-10-CM

## 2014-01-01 DIAGNOSIS — R6889 Other general symptoms and signs: Secondary | ICD-10-CM

## 2014-01-01 DIAGNOSIS — Z124 Encounter for screening for malignant neoplasm of cervix: Secondary | ICD-10-CM

## 2014-01-01 DIAGNOSIS — Z1211 Encounter for screening for malignant neoplasm of colon: Secondary | ICD-10-CM

## 2014-01-01 MED ORDER — VALACYCLOVIR HCL 500 MG PO TABS: ORAL_TABLET | ORAL | Status: DC

## 2014-01-01 MED ORDER — ESTROGENS, CONJUGATED 0.625 MG/GM VA CREA: TOPICAL_CREAM | VAGINAL | Status: DC

## 2014-01-01 MED ORDER — ESTRADIOL-NORETHINDRONE ACET 0.05-0.25 MG/DAY TD PTTW: MEDICATED_PATCH | TRANSDERMAL | Status: DC

## 2014-01-01 NOTE — Patient Instructions (Signed)

## 2014-01-01 NOTE — Progress Notes (Signed)
63 y.o. G1P1 MarriedCaucasianF here for annual exam.  No vaginal bleeding.  Doing well.  PCP Dr. Addison Lank.  Goes every six months.  Plays bridge and travels to tournament.  Is now a Contractor.  Patient's last menstrual period was 09/05/2003.          Sexually active: no  The current method of family planning is none.    Exercising: yes  swimming, walking, and weights-not on regular schedule yet Smoker:  no  Health Maintenance: Pap:  09/12/11-WNL/negative HR HPV History of abnormal Pap:  no MMG:  09/10/13 3D-normal Colonoscopy:  1/12-repeat in 3 years-patient will call to schedule BMD:   08/05/12-started Forteo.  Will see Dr. Chalmers Cater in December and have BMD then.   TDaP:  2009 Screening Labs: PCP, Hb today: PCP, Urine today: PCP   reports that she has never smoked. She has never used smokeless tobacco. She reports that she drinks alcohol. She reports that she does not use illicit drugs.  Past Medical History  Diagnosis Date  . Acid reflux   . High cholesterol   . Dysrhythmia     PVC's with caffeine and anxiety  . Sleep apnea     no CPAP/ last study 9 yrs ago- "mild per patient"  . Heart murmur     since childhood  . Arthritis   . Hypertension     OV with clearance and note Dr Addison Lank 6/13 chart  . Asthma     LOV  6/13  Dr Annamaria Boots EPIC/ clearance on chart  from 10/12  . Hiatal hernia   . Osteopenia   . Osteoporosis   . Depression   . Stye 06/2009    eye  . STD (sexually transmitted disease)     HSV2    Past Surgical History  Procedure Laterality Date  . Cesarean section    . Breast reduction surgery    . Nasal septum surgery    . 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 Total knee arthroplasty  03/18/2012    Procedure: TOTAL KNEE ARTHROPLASTY;  Surgeon: Gearlean Alf, MD;  Location: [facility];  Service: Orthopedics;  Laterality: Left;  . Colonoscopy  09/2010    Current Outpatient Prescriptions  Medication Sig Dispense Refill  . albuterol  (VENTOLIN HFA) 108 (90 BASE) MCG/ACT inhaler Inhale 2 puffs into the lungs every 4 (four) hours as needed for wheezing or shortness of breath.  1 Inhaler  prn  . amLODipine (NORVASC) 5 MG tablet Take 5 mg by mouth daily before breakfast.       . atorvastatin (LIPITOR) 40 MG tablet Take 40 mg by mouth daily.      Marland Kitchen azelastine (ASTELIN) 137 MCG/SPRAY nasal spray 1-2 sprays in each nostril QHS  30 mL  6  . CALCIUM PO Take 1,000 mg by mouth daily.      . cetirizine (ZYRTEC) 10 MG tablet Take 10 mg by mouth daily as needed. Allergies      . Cholecalciferol (VITAMIN D PO) Take 5,000 Int'l Units by mouth daily.      . COMBIPATCH 0.05-0.25 MG/DAY APPLY 1 PATCH TWO TIMES A WEEK AS DIRECTED  8 patch  0  . conjugated estrogens (PREMARIN) vaginal cream Place vaginally 2 (two) times a week. LOT [id number] exp 11/14 x 1 LOT [id number] exp 9/14 x 2      . cyclobenzaprine (FLEXERIL) 10 MG tablet as needed.      Marland Kitchen esomeprazole (NEXIUM) 40  MG packet Take 40 mg by mouth daily before breakfast.       . eszopiclone (LUNESTA) 1 MG TABS Take 3 mg by mouth at bedtime as needed. Sleep      . Fluticasone-Salmeterol (ADVAIR DISKUS) 100-50 MCG/DOSE AEPB 1 puff then rinse mouth, twice daily- maintenance  180 each  3  . FORTEO 600 MCG/2.4ML SOLN       . magnesium gluconate (MAGONATE) 500 MG tablet Take 500 mg by mouth at bedtime as needed.      . mometasone (NASONEX) 50 MCG/ACT nasal spray Place 2 sprays into the nose daily as needed. Allergies  17 g  prn  . montelukast (SINGULAIR) 10 MG tablet TAKE 1 TABLET AT BEDTIME  90 tablet  3  . Multiple Vitamin (MULTIVITAMIN) tablet Take 1 tablet by mouth daily. Contains calcium      . Probiotic Product (PROBIOTIC DAILY PO) Take 1 tablet by mouth daily.      . traZODone (DESYREL) 50 MG tablet Take one tablet at bedtime      . valACYclovir (VALTREX) 500 MG tablet Take 1 tablet (500 mg total) by mouth 2 (two) times daily.  30 tablet  0  . amoxicillin (AMOXIL) 500 MG capsule        No  current facility-administered medications for this visit.    Family History  Problem Relation Age of Onset  . Heart disease Father   . Cancer Father   . Arthritis Mother   . Pancreatic cancer    . Coronary artery disease Other   . Cancer Other   . Asthma Other   . Hypertension Mother   . Hypertension Father   . Nephritis Daughter     glomerular  . Kidney Stones Father     ROS:  Pertinent items are noted in HPI.  Otherwise, a comprehensive ROS was negative.  Exam:   BP 134/80  Pulse 60  Resp 12  Ht 5' 4.5" (1.638 m)  Wt 183 lb 9.6 oz (83.28 kg)  BMI 31.04 kg/m2  LMP 09/05/2003  Weight change: -6LB   Height: 5' 4.5" (163.8 cm)  Ht Readings from Last 3 Encounters:  01/01/14 5' 4.5" (1.638 m)  08/20/13 5' 4.25" (1.632 m)  08/14/13 5' 4.25" (1.632 m)    General appearance: alert, cooperative and appears stated age Head: Normocephalic, without obvious abnormality, atraumatic Neck: no adenopathy, supple, symmetrical, trachea midline and thyroid normal to inspection and palpation Lungs: clear to auscultation bilaterally Breasts: normal appearance, no masses or tenderness Heart: regular rate and rhythm Abdomen: soft, non-tender; bowel sounds normal; no masses,  no organomegaly Extremities: extremities normal, atraumatic, no cyanosis or edema Skin: Skin color, texture, turgor normal. No rashes or lesions Lymph nodes: Cervical, supraclavicular, and axillary nodes normal. No abnormal inguinal nodes palpated Neurologic: Grossly normal   Pelvic: External genitalia:  no lesions              Urethra:  normal appearing urethra with no masses, tenderness or lesions              Bartholins and Skenes: normal                 Vagina: normal appearing vagina with normal color and discharge, no lesions              Cervix: no lesions              Pap taken: yes Bimanual Exam:  Uterus:  normal size, contour, position, consistency, mobility,  non-tender              Adnexa: normal  adnexa and no mass, fullness, tenderness               Rectovaginal: Confirms               Anus:  normal sphincter tone, no lesions  A:  Well Woman with normal exam Osteoporosis, on Forteo.  Has appt with Dr. Chalmers Cater in December. Elevated lipids Hypertension Asthma  P:   Mammogram yearly.  Pt did in 1/15. pap smear obtained.  HR HPV neg 1/13 On Combipath 50/140, 1/2 patch twice weekly.  Rx to pharmacy. Premarin vaginal cream 1/2 gram twice weekly.  Rx to pharmacy. Valtrex 500mg  daily, increase to BID x 3 days.  #45/12RF. Sees Dr. Addison Lank twice yearly. Referral for colonoscopy to Dr. Collene Mares return annually or prn  An After Visit Summary was printed and given to the patient.

## 2014-01-05 LAB — IPS PAP SMEAR ONLY

## 2014-01-05 NOTE — Addendum Note (Signed)
Addended by: Megan Salon on: 01/05/2014 10:30 AM   Modules accepted: Orders

## 2014-01-07 LAB — IPS HPV ON A LIQUID BASED SPECIMEN

## 2014-01-14 ENCOUNTER — Telehealth: Payer: Self-pay | Admitting: Emergency Medicine

## 2014-01-14 DIAGNOSIS — R87618 Other abnormal cytological findings on specimens from cervix uteri: Secondary | ICD-10-CM

## 2014-01-14 DIAGNOSIS — R8789 Other abnormal findings in specimens from female genital organs: Secondary | ICD-10-CM

## 2014-01-14 NOTE — Telephone Encounter (Signed)
Message copied by Michele Mcalpine on Wed Jan 14, 2014  8:58 AM ------      Message from: Megan Salon      Created: Thu Jan 08, 2014  8:41 AM       Inform Pap abnormal--LGSIL.  Needs colposcopy. ------

## 2014-01-14 NOTE — Telephone Encounter (Signed)
Spoke with patient. She was advised of message from Dr. Sabra Heck. Patient verbalized understanding and is agreeable to schedule colposcopy. Patient asked if changes of pap smear were r/t HPV and I advised yes, that HPV can cause normal cells on the cervix to turn abnormal, advised that Dr. Sabra Heck will also review results prior to colposcopy.  We discussed procedure of colposcopy and pre procedure instructions given.  Motrin instructions given. Motrin=Advil=Ibuprofen Can take 800 mg (Can purchase over the counter, you will need four 200 mg pills). Take with food. Make sure to eat a meal and drink fluids prior to appointment.  Patient states she usually does not take Motrin, I advised she did not have to but that it is recommended to help with any cramping during procedure. Patient would like to know if she can take any other medication, advised we have medication here in the office but that she will need to come early, bring a driver and sign consent forms. She will call back if she would like to do that. Procedure scheduled for 02/06/14 with Dr. Sabra Heck.   Routing to provider for final review. Patient agreeable to disposition. Will close encounter

## 2014-01-27 ENCOUNTER — Telehealth: Payer: Self-pay | Admitting: Internal Medicine

## 2014-01-27 NOTE — Telephone Encounter (Signed)
Called and spoke with pt and she wanted to schedule an appt with CY to discuss her sleep apnea.  appt has been scheduled for the pt to see CY July 1.  Nothing further is needed.

## 2014-02-06 ENCOUNTER — Ambulatory Visit (INDEPENDENT_AMBULATORY_CARE_PROVIDER_SITE_OTHER): Payer: BC Managed Care – PPO | Admitting: Obstetrics & Gynecology

## 2014-02-06 VITALS — BP 122/80 | HR 60 | Resp 12 | Ht 64.5 in | Wt 184.0 lb

## 2014-02-06 DIAGNOSIS — R6889 Other general symptoms and signs: Secondary | ICD-10-CM

## 2014-02-06 DIAGNOSIS — IMO0002 Reserved for concepts with insufficient information to code with codable children: Secondary | ICD-10-CM

## 2014-02-06 DIAGNOSIS — R8789 Other abnormal findings in specimens from female genital organs: Secondary | ICD-10-CM

## 2014-02-06 DIAGNOSIS — B977 Papillomavirus as the cause of diseases classified elsewhere: Secondary | ICD-10-CM

## 2014-02-06 DIAGNOSIS — R87618 Other abnormal cytological findings on specimens from cervix uteri: Secondary | ICD-10-CM

## 2014-02-06 NOTE — Progress Notes (Signed)
Subjective:     Patient ID: [name], female   DOB: 11-12-1950, 63 y.o.   MRN: [id number]  HPI 63 yo G1P1 MWF here for colposcopy due to LGSIL pap and +HR HPV testing obtained at [facility] 01/01/14.  Pt had new sex partner in December.  Came in for STD testing which was negative.  D/W pt pap findings, colposcopy, nature of HPV.  All questions answered.  Review of Systems  All other systems reviewed and are negative.      Objective:   Physical Exam  Constitutional: She is oriented to person, place, and time. She appears well-developed and well-nourished.  Genitourinary:    Neurological: She is alert and oriented to person, place, and time.   Speculum placed.  3% acetic acid applied to cervix for >45 seconds.  Cervix visualized with both 7.5X and 15X magnification.  Green filter also used.  Lugols solution was used.  Findings:  No AWE changes noted with acetic acid application.  However, with Lugol's application, three areas of hypopigmentation noted at 3, 5-6 o'clock as in image above.  Biopsy:  3 and 5 o'clock.  ECC:  was performed.  Monsel's was needed.  3 o'clock site bled more than normal biopsy site bleeding.  Pressure applied.  Cautery obtained but not ultimately needed as hemostasis was achieved with Monsel's solution.  Ultimately, excellent hemostasis was present.  Pt tolerated procedure well and all instruments were removed.  Findings noted above on picture of cervix.     Assessment:     LGSIL     Plan:     Biopsies at 3 and 5 o'clock obtained as well as ECC.  If CIN1 or less, repeat Pap 6 months.  As pt's exposure was less than six months ago and now has LGSIL pap, I am concerned for more aggressive HPV.    Pt requests message be left on cell phone as she will be traveling next week.

## 2014-02-10 LAB — IPS OTHER TISSUE BIOPSY

## 2014-02-16 ENCOUNTER — Telehealth: Payer: Self-pay

## 2014-02-16 NOTE — Telephone Encounter (Signed)
Message copied by Robley Fries on Mon Feb 16, 2014  2:33 PM ------      Message from: Megan Salon      Created: Sat Feb 14, 2014 11:05 PM       Inform biopsies all showed mild dysplasia.  ECC was negative.  Repeat Pap and HR HPV 1 year.  08 recall. ------

## 2014-02-16 NOTE — Telephone Encounter (Signed)
Lmtcb//kn 

## 2014-02-17 NOTE — Telephone Encounter (Signed)
Patient is calling [name] back said if it isnt within 30 mins to try on cell

## 2014-02-17 NOTE — Telephone Encounter (Signed)
Patient returning Kelly's call. °

## 2014-02-19 NOTE — Telephone Encounter (Signed)
I have attempted to contact this patient by phone with the following results: left message to return my call on answering machine (mobile).  No answer on home number after 5 rings.  To ask for Colletta Maryland or Claiborne Billings on return call.

## 2014-02-20 NOTE — Telephone Encounter (Signed)
Spoke with patient. Results given as seen below. Patient agreeable and verbalizes understanding. AEX scheduled for 01/21/15.  CC: Maryann Conners  Routing to provider for final review. Patient agreeable to disposition. Will close encounter

## 2014-02-20 NOTE — Telephone Encounter (Signed)
Patient said she is calling stephanie or kelly back

## 2014-03-04 ENCOUNTER — Ambulatory Visit (INDEPENDENT_AMBULATORY_CARE_PROVIDER_SITE_OTHER): Payer: BC Managed Care – PPO | Admitting: Internal Medicine

## 2014-03-04 ENCOUNTER — Encounter (INDEPENDENT_AMBULATORY_CARE_PROVIDER_SITE_OTHER): Payer: Self-pay

## 2014-03-04 ENCOUNTER — Encounter: Payer: Self-pay | Admitting: Internal Medicine

## 2014-03-04 VITALS — BP 130/82 | HR 80 | Ht 64.5 in | Wt 185.8 lb

## 2014-03-04 DIAGNOSIS — J45998 Other asthma: Secondary | ICD-10-CM

## 2014-03-04 DIAGNOSIS — J309 Allergic rhinitis, unspecified: Secondary | ICD-10-CM

## 2014-03-04 DIAGNOSIS — J45909 Unspecified asthma, uncomplicated: Secondary | ICD-10-CM

## 2014-03-04 DIAGNOSIS — G4733 Obstructive sleep apnea (adult) (pediatric): Secondary | ICD-10-CM

## 2014-03-04 MED ORDER — ALBUTEROL SULFATE HFA 108 (90 BASE) MCG/ACT IN AERS: 2.0000 | INHALATION_SPRAY | RESPIRATORY_TRACT | Status: DC | PRN

## 2014-03-04 MED ORDER — ESZOPICLONE 1 MG PO TABS: 3.0000 mg | ORAL_TABLET | Freq: Every evening | ORAL | Status: DC | PRN

## 2014-03-04 MED ORDER — FLUTICASONE-SALMETEROL 100-50 MCG/DOSE IN AEPB: INHALATION_SPRAY | RESPIRATORY_TRACT | Status: DC

## 2014-03-04 MED ORDER — DOXEPIN HCL 3 MG PO TABS: ORAL_TABLET | ORAL | Status: DC

## 2014-03-04 MED ORDER — TRAZODONE HCL 50 MG PO TABS: ORAL_TABLET | ORAL | Status: DC

## 2014-03-04 MED ORDER — MOMETASONE FUROATE 50 MCG/ACT NA SUSP: 2.0000 | Freq: Every day | NASAL | Status: DC | PRN

## 2014-03-04 MED ORDER — AZELASTINE HCL 0.1 % NA SOLN: NASAL | Status: DC

## 2014-03-04 MED ORDER — AMOXICILLIN-POT CLAVULANATE 875-125 MG PO TABS: 1.0000 | ORAL_TABLET | Freq: Two times a day (BID) | ORAL | Status: DC

## 2014-03-04 MED ORDER — AZELASTINE-FLUTICASONE 137-50 MCG/ACT NA SUSP: 1.0000 | Freq: Every day | NASAL | Status: DC

## 2014-03-04 NOTE — Assessment & Plan Note (Signed)
Controlled. Plan-medication refills with discussion

## 2014-03-04 NOTE — Progress Notes (Signed)
Patient ID: [name], female    DOB: 01-Mar-1951, 63 y.o.   MRN: [id number]  HPI 11/28/10- 63 yo F never smoker, using Advair 20, Ventolin HFA, Singulair. Last here acutely ill with URI/ bronchitis September 14, 2009. That resolved. She was on augmentin for a sinusitis, ending a week or so ago. She feels completely well now. She would like an antibiotic script to hold. Using Ventolin rescue inhaler rarely since winter bronchitis cleared. Used some before exercise.   06/27/11-63 yo F never smoker, using Advair 20, Ventolin HFA, Singulair. Last here acutely ill with URI/ bronchitis September 14, 2009. Reports fever for a day or 2 after she had both flu and pneumonia vaccines a month ago. Now feels well, noticing some nasal congestion and right maxillary pressure with sneeze. Mostly she asks medication refills. She likes to keep Augmentin available and we discussed this. She starts to wheeze if she skips Advair.  02/28/12- 63 yo F never smoker folowed for asthma, allergic rhinitis, complicated by hx OSA/ oral appliance, HBP, GERD Pt here today for surgery clearance > pt having knee surgery on July 15th 2013/ Dr Maureen Ralphs. Pt state having no issues  at this time Pending left total knee replacement. 3 weeks ago acute bronchitis did not respond to Biaxin so she went to a walk-in clinic and was treated with amoxicillin. Now denies any routine cough or wheeze. We discussed her history of obstructive sleep apnea. She still snores. She never tried CPAP and never got used to an oral appliance. She does not notice daytime sleepiness much, especially since she retired. Continues Advair twice daily, using a rescue inhaler 2 or 3 times per month. PFT 05/30/10- FEV1 2.56/ 91%, FEV1/FVC 0.73, DLCO 84%  within normal limits.  06/27/12-  63 yo F never smoker folowed for asthma, allergic rhinitis, complicated by hx OSA/ quit oral appliance, HBP, GERD Denies any flare ups or attacks since last visit; doing well  overall. Had left total knee replacement and did well with no respiratory complications. No longer treating sleep apnea. She quit using the oral appliance and is not ready to try again.  07/10/13-  63 yo F never smoker folowed for asthma, allergic rhinitis, complicated by hx OSA/ quit oral appliance, HBP, GERD Follows For: Runny nose, sneezing, itchy throat , occas wheezing - Denies cough or sob Persistent mild tightness, not really wheezy. Some itching and sneezing. Medication talk. Gets flu vaccine at primary physician. Asks about an update sleep study. She had documented sleep apnea in the past. She did not tolerate CPAP or an oral appliance. Aware of snoring and daytime tiredness.  03/04/14- 63 yo F never smoker folowed for asthma, allergic rhinitis, complicated by hx OSA/ quit oral appliance, HBP, GERD self referral .  trouble sleeping, does not feel rested, been told that she snores, unrested Sleep apnea diagnosed in the past and she tried CPAP but could not get comfortable with it Says friends notice for sleeping on trips, but her husband does not notice anything. Some difficulty initiating and maintaining sleep-uses either trazodone, Silenor, lunesta when needed.  Bedtime between 11 PM and 1 AM, sleep latency 15 minutes, waking once or twice before up between 8 and 10 AM. History of sinus surgery, hypertension  Review of Systems- see HPI Constitutional:   No-   weight loss, night sweats, fevers, chills, +fatigue, lassitude. HEENT:   No-  headaches, difficulty swallowing, tooth/dental problems, sore throat,       No-  sneezing,  itching, ear ache, +nasal congestion, post nasal drip,  CV:  No-   chest pain, orthopnea, PND, swelling in lower extremities, anasarca,  dizziness, palpitations Resp: No-   shortness of breath with exertion or at rest.              No-   productive cough,  No non-productive cough,  No- coughing up of blood.              No-   change in color of mucus.  No-  wheezing.   Skin: No-   rash or lesions. GI:  +heartburn, indigestion, abdominal pain, nausea, vomiting, GU: . MS:  See HPI-  joint pain or swelling.   Neuro-     nothing unusual Psych:  No- change in mood or affect. No depression or anxiety.  No memory loss.  OBJ General- Alert, Oriented, Affect-appropriate, Distress- none acute. Looks well. Skin- rash-none, lesions- none, excoriation- none Lymphadenopathy- none Head- atraumatic            Eyes- Gross vision intact, PERRLA, conjunctivae clear secretions            Ears- Hearing, canals-normal            Nose- +turbinate edema, no-Septal dev, +mucus bridging, no-polyps, erosion, perforation             Throat- Mallampati II-III , mucosa clear , drainage- none, tonsils- atrophic Neck- flexible , trachea midline, no stridor , thyroid nl, carotid no bruit Chest - symmetrical excursion , unlabored           Heart/CV- RRR , no murmur , no gallop  , no rub, nl s1 s2                           - JVD- none , edema- none, stasis changes- none, varices- none           Lung-  wheeze+slight, cough- none , dullness-none, rub- none           Chest wall-  Abd-  Br/ Gen/ Rectal- Not done, not indicated Extrem- cyanosis- none, clubbing, none, atrophy- none, strength- nl Neuro- grossly intact to observation

## 2014-03-04 NOTE — Patient Instructions (Addendum)
Order- Schedule NPSG split protcol     Dx OSA  Ok to use Trazodone or Silenor as you have been. Ok to bring one for use at sleep study if you want.  Continue routine asthma meds  Sample Dymista ( = your astelin plus nasonex, combined in one dispenser)    1-2 puffs each nostril once daily at bedtime  Meds refilled

## 2014-03-04 NOTE — Assessment & Plan Note (Signed)
Plan-scheduled new sleep study

## 2014-03-04 NOTE — Assessment & Plan Note (Signed)
She has used astelin and nasonex somewhat irregularly Plan-try sample Dymista

## 2014-04-21 ENCOUNTER — Ambulatory Visit (HOSPITAL_BASED_OUTPATIENT_CLINIC_OR_DEPARTMENT_OTHER): Payer: BC Managed Care – PPO | Attending: Internal Medicine | Admitting: Radiology

## 2014-04-21 VITALS — Ht 64.5 in | Wt 185.0 lb

## 2014-04-21 DIAGNOSIS — G4733 Obstructive sleep apnea (adult) (pediatric): Secondary | ICD-10-CM

## 2014-04-21 DIAGNOSIS — G4730 Sleep apnea, unspecified: Secondary | ICD-10-CM | POA: Diagnosis present

## 2014-04-21 DIAGNOSIS — G4710 Hypersomnia, unspecified: Secondary | ICD-10-CM | POA: Diagnosis present

## 2014-04-21 DIAGNOSIS — Z9989 Dependence on other enabling machines and devices: Secondary | ICD-10-CM

## 2014-04-25 DIAGNOSIS — G4733 Obstructive sleep apnea (adult) (pediatric): Secondary | ICD-10-CM

## 2014-04-25 NOTE — Sleep Study (Signed)
   NAME: [name] DATE OF BIRTH:  April 25, 1951 MEDICAL RECORD NUMBER [id number]  LOCATION: [facility]  PHYSICIAN: Dekota Shenk D  DATE OF STUDY: 04/21/2014  SLEEP STUDY TYPE: Nocturnal Polysomnogram               REFERRING PHYSICIAN: Baird Lyons D, MD  INDICATION FOR STUDY: Hypersomnia with sleep apnea  EPWORTH SLEEPINESS SCORE:   3/24 HEIGHT: 5' 4.5" (163.8 cm)  WEIGHT: 83.915 kg (185 lb)    Body mass index is 31.28 kg/(m^2).  NECK SIZE: 14 in.  MEDICATIONS: Charted for review  SLEEP ARCHITECTURE: Split study protocol. During the diagnostic phase, total sleep time 125.5 minutes with sleep efficiency 81.2%. Stage I was 12%, stage II 81.7%, stage III 6.4%, REM absent sleep latency 10 minutes, awake after sleep onset 18 minutes, arousal index 32.5, bedtime medication: Lunesta  RESPIRATORY DATA: ` Apnea hypopneas index (AHI) 21 per hour. 44 total events scored including 2 mixed apneas and 42 hypopneas. Events were not positional. REM was absent. CPAP titrated to 14 CWP, AHI 0 per hour. She wore a small fullface mask.  OXYGEN DATA: Moderately loud snoring before CPAP with oxygen desaturation to a nadir of 88% on room air. With CPAP control, snoring was prevented and mean oxygen saturation 93.2% on room air.  CARDIAC DATA: Sinus rhythm with PACs  MOVEMENT/PARASOMNIA: A few limb jerks were noted with little effect on sleep. Bathroom x1  IMPRESSION/ RECOMMENDATION:   1) Moderate obstructive sleep apnea/hypopneas syndrome, AHI 21 per hour with non-positional events. Moderately loud snoring with oxygen desaturation to a nadir of 88% on room air. 2) Successful CPAP titration to 14 CWP, AHI 0 per hour. She wore a small ResMed Quattro fx fullface mask with heated humidifier and an EPR of 2.   Deneise Lever Diplomate, American Board of Sleep Medicine  ELECTRONICALLY SIGNED ON:  04/25/2014, 2:45 PM [place] PH: [phone]   FX:  [phone] [place]

## 2014-05-18 ENCOUNTER — Other Ambulatory Visit: Payer: Self-pay | Admitting: Internal Medicine

## 2014-05-20 ENCOUNTER — Encounter: Payer: Self-pay | Admitting: Internal Medicine

## 2014-05-20 ENCOUNTER — Ambulatory Visit (INDEPENDENT_AMBULATORY_CARE_PROVIDER_SITE_OTHER): Payer: BC Managed Care – PPO | Admitting: Internal Medicine

## 2014-05-20 ENCOUNTER — Encounter (INDEPENDENT_AMBULATORY_CARE_PROVIDER_SITE_OTHER): Payer: Self-pay

## 2014-05-20 VITALS — BP 110/80 | HR 95 | Ht 64.5 in | Wt 188.0 lb

## 2014-05-20 DIAGNOSIS — J45909 Unspecified asthma, uncomplicated: Secondary | ICD-10-CM

## 2014-05-20 DIAGNOSIS — G4733 Obstructive sleep apnea (adult) (pediatric): Secondary | ICD-10-CM

## 2014-05-20 DIAGNOSIS — J45998 Other asthma: Secondary | ICD-10-CM

## 2014-05-20 NOTE — Telephone Encounter (Signed)
Please advise if okay to refill. Thanks.  

## 2014-05-20 NOTE — Patient Instructions (Signed)
Order- New DME- (husband uses [facility]) CPAP 14, mask of choice, humidifier, supplies   Dx OSA

## 2014-05-20 NOTE — Telephone Encounter (Signed)
Ok to refill augmentin as before

## 2014-05-20 NOTE — Progress Notes (Signed)
Patient ID: [name], female    DOB: May 26, 1951, 63 y.o.   MRN: [id number]  HPI 11/28/10- 9 yo F never smoker, using Advair 20, Ventolin HFA, Singulair. Last here acutely ill with URI/ bronchitis September 14, 2009. That resolved. She was on augmentin for a sinusitis, ending a week or so ago. She feels completely well now. She would like an antibiotic script to hold. Using Ventolin rescue inhaler rarely since winter bronchitis cleared. Used some before exercise.   06/27/11-59 yo F never smoker, using Advair 20, Ventolin HFA, Singulair. Last here acutely ill with URI/ bronchitis September 14, 2009. Reports fever for a day or 2 after she had both flu and pneumonia vaccines a month ago. Now feels well, noticing some nasal congestion and right maxillary pressure with sneeze. Mostly she asks medication refills. She likes to keep Augmentin available and we discussed this. She starts to wheeze if she skips Advair.  02/28/12- 65 yo F never smoker folowed for asthma, allergic rhinitis, complicated by hx OSA/ oral appliance, HBP, GERD Pt here today for surgery clearance > pt having knee surgery on July 15th 2013/ Dr Maureen Ralphs. Pt state having no issues  at this time Pending left total knee replacement. 3 weeks ago acute bronchitis did not respond to Biaxin so she went to a walk-in clinic and was treated with amoxicillin. Now denies any routine cough or wheeze. We discussed her history of obstructive sleep apnea. She still snores. She never tried CPAP and never got used to an oral appliance. She does not notice daytime sleepiness much, especially since she retired. Continues Advair twice daily, using a rescue inhaler 2 or 3 times per month. PFT 05/30/10- FEV1 2.56/ 91%, FEV1/FVC 0.73, DLCO 84%  within normal limits.  06/27/12-  61 yo F never smoker folowed for asthma, allergic rhinitis, complicated by hx OSA/ quit oral appliance, HBP, GERD Denies any flare ups or attacks since last visit; doing well  overall. Had left total knee replacement and did well with no respiratory complications. No longer treating sleep apnea. She quit using the oral appliance and is not ready to try again.  07/10/13-  30 yo F never smoker folowed for asthma, allergic rhinitis, complicated by hx OSA/ quit oral appliance, HBP, GERD Follows For: Runny nose, sneezing, itchy throat , occas wheezing - Denies cough or sob Persistent mild tightness, not really wheezy. Some itching and sneezing. Medication talk. Gets flu vaccine at primary physician. Asks about an update sleep study. She had documented sleep apnea in the past. She did not tolerate CPAP or an oral appliance. Aware of snoring and daytime tiredness.  03/04/14- 37 yo F never smoker folowed for asthma, allergic rhinitis, complicated by hx OSA/ quit oral appliance, HBP, GERD self referral .  trouble sleeping, does not feel rested, been told that she snores, unrested Sleep apnea diagnosed in the past and she tried CPAP but could not get comfortable with it Says friends notice for sleeping on trips, but her husband does not notice anything. Some difficulty initiating and maintaining sleep-uses either trazodone, Silenor, lunesta when needed.  Bedtime between 11 PM and 1 AM, sleep latency 15 minutes, waking once or twice before up between 8 and 10 AM. History of sinus surgery, hypertension  05/20/14- 58 yo F never smoker folowed for asthma, allergic rhinitis, complicated by hx OSA/ quit oral appliance, HBP, GERD FOLLOWS FOR: Pt here to review sleep study. Pt c/o constant fatigue.  NPSG-04/21/14- Moderate obstructive sleep apnea, AHI 21/  hr, CPAP to 14, weight 185 lbs.  Review of Systems- see HPI Constitutional:   No-   weight loss, night sweats, fevers, chills, +fatigue, lassitude. HEENT:   No-  headaches, difficulty swallowing, tooth/dental problems, sore throat,       No-  sneezing, itching, ear ache, +nasal congestion, post nasal drip,  CV:  No-   chest pain,  orthopnea, PND, swelling in lower extremities, anasarca,  dizziness, palpitations Resp: No-   shortness of breath with exertion or at rest.              No-   productive cough,  No non-productive cough,  No- coughing up of blood.              No-   change in color of mucus.  No- wheezing.   Skin: No-   rash or lesions. GI:  +heartburn, indigestion, abdominal pain, nausea, vomiting, GU: . MS:  See HPI-  joint pain or swelling.   Neuro-     nothing unusual Psych:  No- change in mood or affect. No depression or anxiety.  No memory loss.  OBJ General- Alert, Oriented, Affect-appropriate, Distress- none acute. Looks well. Skin- rash-none, lesions- none, excoriation- none Lymphadenopathy- none Head- atraumatic            Eyes- Gross vision intact, PERRLA, conjunctivae clear secretions            Ears- Hearing, canals-normal            Nose- +turbinate edema, no-Septal dev, mucus-none, no-polyps, erosion, perforation             Throat- Mallampati II-III , mucosa clear , drainage- none, tonsils- atrophic Neck- flexible , trachea midline, no stridor , thyroid nl, carotid no bruit Chest - symmetrical excursion , unlabored           Heart/CV- RRR , no murmur , no gallop  , no rub, nl s1 s2                           - JVD- none , edema- none, stasis changes- none, varices- none           Lung-  wheeze+slight, cough- none , dullness-none, rub- none           Chest wall-  Abd-  Br/ Gen/ Rectal- Not done, not indicated Extrem- cyanosis- none, clubbing, none, atrophy- none, strength- nl Neuro- grossly intact to observation

## 2014-06-07 NOTE — Assessment & Plan Note (Signed)
Moderate obstructive sleep apnea. Education done including discussion of weight, responsibility to drive safely, treatment options, basic sleep hygiene. Plan-CPAP 14

## 2014-06-07 NOTE — Assessment & Plan Note (Signed)
Mild to moderate, persistent, uncomplicated Plan-medication discussion

## 2014-07-06 ENCOUNTER — Encounter: Payer: Self-pay | Admitting: Internal Medicine

## 2014-07-08 ENCOUNTER — Ambulatory Visit (INDEPENDENT_AMBULATORY_CARE_PROVIDER_SITE_OTHER): Payer: Medicare Other | Admitting: Emergency Medicine

## 2014-07-08 ENCOUNTER — Encounter: Payer: Self-pay | Admitting: Internal Medicine

## 2014-07-08 ENCOUNTER — Encounter (INDEPENDENT_AMBULATORY_CARE_PROVIDER_SITE_OTHER): Payer: Self-pay

## 2014-07-08 ENCOUNTER — Ambulatory Visit: Payer: Medicare Other | Admitting: Internal Medicine

## 2014-07-08 DIAGNOSIS — G4733 Obstructive sleep apnea (adult) (pediatric): Secondary | ICD-10-CM

## 2014-07-08 DIAGNOSIS — Z23 Encounter for immunization: Secondary | ICD-10-CM

## 2014-07-08 NOTE — Progress Notes (Signed)
Patient ID: [name], female    DOB: May 26, 1951, 63 y.o.   MRN: [id number]  HPI 11/28/10- 63 yo F never smoker, using Advair 20, Ventolin HFA, Singulair. Last here acutely ill with URI/ bronchitis September 14, 2009. That resolved. She was on augmentin for a sinusitis, ending a week or so ago. She feels completely well now. She would like an antibiotic script to hold. Using Ventolin rescue inhaler rarely since winter bronchitis cleared. Used some before exercise.   06/27/11-63 yo F never smoker, using Advair 20, Ventolin HFA, Singulair. Last here acutely ill with URI/ bronchitis September 14, 2009. Reports fever for a day or 2 after she had both flu and pneumonia vaccines a month ago. Now feels well, noticing some nasal congestion and right maxillary pressure with sneeze. Mostly she asks medication refills. She likes to keep Augmentin available and we discussed this. She starts to wheeze if she skips Advair.  02/28/12- 63 yo F never smoker folowed for asthma, allergic rhinitis, complicated by hx OSA/ oral appliance, HBP, GERD Pt here today for surgery clearance > pt having knee surgery on July 15th 2013/ Dr Maureen Ralphs. Pt state having no issues  at this time Pending left total knee replacement. 3 weeks ago acute bronchitis did not respond to Biaxin so she went to a walk-in clinic and was treated with amoxicillin. Now denies any routine cough or wheeze. We discussed her history of obstructive sleep apnea. She still snores. She never tried CPAP and never got used to an oral appliance. She does not notice daytime sleepiness much, especially since she retired. Continues Advair twice daily, using a rescue inhaler 2 or 3 times per month. PFT 05/30/10- FEV1 2.56/ 91%, FEV1/FVC 0.73, DLCO 84%  within normal limits.  06/27/12-  63 yo F never smoker folowed for asthma, allergic rhinitis, complicated by hx OSA/ quit oral appliance, HBP, GERD Denies any flare ups or attacks since last visit; doing well  overall. Had left total knee replacement and did well with no respiratory complications. No longer treating sleep apnea. She quit using the oral appliance and is not ready to try again.  07/10/13-  63 yo F never smoker folowed for asthma, allergic rhinitis, complicated by hx OSA/ quit oral appliance, HBP, GERD Follows For: Runny nose, sneezing, itchy throat , occas wheezing - Denies cough or sob Persistent mild tightness, not really wheezy. Some itching and sneezing. Medication talk. Gets flu vaccine at primary physician. Asks about an update sleep study. She had documented sleep apnea in the past. She did not tolerate CPAP or an oral appliance. Aware of snoring and daytime tiredness.  03/04/14- 63 yo F never smoker folowed for asthma, allergic rhinitis, complicated by hx OSA/ quit oral appliance, HBP, GERD self referral .  trouble sleeping, does not feel rested, been told that she snores, unrested Sleep apnea diagnosed in the past and she tried CPAP but could not get comfortable with it Says friends notice for sleeping on trips, but her husband does not notice anything. Some difficulty initiating and maintaining sleep-uses either trazodone, Silenor, lunesta when needed.  Bedtime between 11 PM and 1 AM, sleep latency 15 minutes, waking once or twice before up between 8 and 10 AM. History of sinus surgery, hypertension  05/20/14- 63 yo F never smoker folowed for asthma, allergic rhinitis, complicated by hx OSA/ quit oral appliance, HBP, GERD FOLLOWS FOR: Pt here to review sleep study. Pt c/o constant fatigue.  NPSG-04/21/14- Moderate obstructive sleep apnea, AHI 21/  hr, CPAP to 14, weight 185 lbs.  07/08/14- 63 yo F never smoker folowed for asthma, allergic rhinitis, complicated by hx OSA/ quit oral appliance, HBP, GERD FOLLOWS FOR: Pt states she is wearing CPAP nightly and is trying to get 4 hours per night. Pt states it is hard getting use to and is not sleeping well with it. Pt denies issues with  machine and pressure. Pt c/o mask leaking.      Review of Systems- see HPI Constitutional:   No-   weight loss, night sweats, fevers, chills, +fatigue, lassitude. HEENT:   No-  headaches, difficulty swallowing, tooth/dental problems, sore throat,       No-  sneezing, itching, ear ache, +nasal congestion, post nasal drip,  CV:  No-   chest pain, orthopnea, PND, swelling in lower extremities, anasarca,  dizziness, palpitations Resp: No-   shortness of breath with exertion or at rest.              No-   productive cough,  No non-productive cough,  No- coughing up of blood.              No-   change in color of mucus.  No- wheezing.   Skin: No-   rash or lesions. GI:  +heartburn, indigestion, abdominal pain, nausea, vomiting, GU: . MS:  See HPI-  joint pain or swelling.   Neuro-     nothing unusual Psych:  No- change in mood or affect. No depression or anxiety.  No memory loss.  OBJ General- Alert, Oriented, Affect-appropriate, Distress- none acute. Looks well. Skin- rash-none, lesions- none, excoriation- none Lymphadenopathy- none Head- atraumatic            Eyes- Gross vision intact, PERRLA, conjunctivae clear secretions            Ears- Hearing, canals-normal            Nose- +turbinate edema, no-Septal dev, mucus-none, no-polyps, erosion, perforation             Throat- Mallampati II-III , mucosa clear , drainage- none, tonsils- atrophic Neck- flexible , trachea midline, no stridor , thyroid nl, carotid no bruit Chest - symmetrical excursion , unlabored           Heart/CV- RRR , no murmur , no gallop  , no rub, nl s1 s2                           - JVD- none , edema- none, stasis changes- none, varices- none           Lung-  wheeze+slight, cough- none , dullness-none, rub- none           Chest wall-  Abd-  Br/ Gen/ Rectal- Not done, not indicated Extrem- cyanosis- none, clubbing, none, atrophy- none, strength- nl Neuro- grossly intact to observation

## 2014-07-08 NOTE — Patient Instructions (Addendum)
Order-  DME [facility] reduce CPAP to 10 cwp to reduce leaks and improve compliance   Verify that your lunesta is 1 mg tab. If so, try taking up to 3 mg/ night as needed for sleep  Flu vax

## 2014-07-16 ENCOUNTER — Ambulatory Visit: Payer: BC Managed Care – PPO | Admitting: Internal Medicine

## 2014-08-02 ENCOUNTER — Other Ambulatory Visit: Payer: Self-pay | Admitting: Internal Medicine

## 2014-08-17 ENCOUNTER — Telehealth: Payer: Self-pay | Admitting: Obstetrics & Gynecology

## 2014-08-17 NOTE — Telephone Encounter (Signed)
Left message to call Pansie Guggisberg at [phone]. 

## 2014-08-17 NOTE — Telephone Encounter (Signed)
Spoke with patient. Appointment scheduled for 12/17 at 9am. Patient is agreeable to date and time.  Routing to provider for final review. Patient agreeable to disposition. Will close encounter

## 2014-08-17 NOTE — Telephone Encounter (Signed)
Pt says she is having some cramping and would like an appointment with Dr Sabra Heck.

## 2014-08-17 NOTE — Telephone Encounter (Signed)
Spoke with patient. Patient states that she has been having "bloating and feeling uncomfortable in my abdomen" for 1 month. Denies any change in bowel movements, nausea, vomiting, fatigue, and feeling full more quickly after eating. 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 ECC was negative. Patient is in 08 recall. Patient is requesting an appointment 12/15 or 12/16 in the morning, 12/17 in the afternoon or anytime on 12/18. Advised patient will check with provider and return call with appointment date and time options to get her scheduled. Requesting return call before 1pm.

## 2014-08-20 ENCOUNTER — Encounter: Payer: Self-pay | Admitting: Obstetrics & Gynecology

## 2014-08-20 ENCOUNTER — Ambulatory Visit (INDEPENDENT_AMBULATORY_CARE_PROVIDER_SITE_OTHER): Payer: Medicare Other | Admitting: Obstetrics & Gynecology

## 2014-08-20 VITALS — BP 142/82 | HR 60 | Resp 16 | Wt 194.3 lb

## 2014-08-20 DIAGNOSIS — R140 Abdominal distension (gaseous): Secondary | ICD-10-CM

## 2014-08-20 NOTE — Progress Notes (Signed)
Pelvic ultrasound scheduled for 08/25/2014 at 3:30 PM here in office. Patient agreeable to date and time.

## 2014-08-20 NOTE — Progress Notes (Signed)
Subjective:     Patient ID: [name], female   DOB: [birth year], 63 y.o.   MRN: [id number]  HPI 63 yo G1P1 MWF here for about a complaint of abdominal bloating and discomfort that has been going on for about a month.  No vaginal bleeding.  No vaginal discharge.  No nausea, diarrhea, vomiting.  Has constipation off and on for years.  Uses fiber, colace, and Miralax as needed for episodes of constipation.  Pt admits she is very worried about ovarian cancer.  Also, has questions about her Valtrex.  Would like to try and be off the medication.  Hasn't had an outbreak in over a year.  Questions about whether swimming is safe.    Had BMD done yesterday at [facility].  Review of Systems  All other systems reviewed and are negative.      Objective:   Physical Exam  Constitutional: She appears well-developed and well-nourished.  Abdominal: Soft. Bowel sounds are normal. She exhibits no distension and no mass. There is no tenderness. There is no rebound and no guarding.  Genitourinary: Vagina normal and uterus normal. There is no rash, tenderness or lesion on the right labia. There is no rash, tenderness or lesion on the left labia. Cervix exhibits no motion tenderness and no discharge. Right adnexum displays no mass, no tenderness and no fullness. Left adnexum displays no mass, no tenderness and no fullness.  Lymphadenopathy:       Right: No inguinal adenopathy present.       Left: No inguinal adenopathy present.  Skin: Skin is warm and dry.  Psychiatric: She has a normal mood and affect.       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  Couldn't stay to do it today.  Pt is reassured by exam. Pt will stop Valtrex and see if has another outbreak.  If does, will use Valtrex 500mg  bid x 3 days.  Doesn't really want to be on suppression if possible.

## 2014-08-20 NOTE — Addendum Note (Signed)
Addended by: Jaymes Graff on: 08/20/2014 11:12 AM   Modules accepted: Orders

## 2014-08-25 ENCOUNTER — Ambulatory Visit (INDEPENDENT_AMBULATORY_CARE_PROVIDER_SITE_OTHER): Payer: Medicare Other

## 2014-08-25 ENCOUNTER — Encounter: Payer: Self-pay | Admitting: Obstetrics & Gynecology

## 2014-08-25 ENCOUNTER — Ambulatory Visit (INDEPENDENT_AMBULATORY_CARE_PROVIDER_SITE_OTHER): Payer: Medicare Other | Admitting: Obstetrics & Gynecology

## 2014-08-25 VITALS — BP 120/82 | Ht 64.5 in | Wt 195.0 lb

## 2014-08-25 DIAGNOSIS — R140 Abdominal distension (gaseous): Secondary | ICD-10-CM

## 2014-08-25 DIAGNOSIS — D251 Intramural leiomyoma of uterus: Secondary | ICD-10-CM

## 2014-08-25 NOTE — Progress Notes (Signed)
63 y.o. [name] here for a pelvic ultrasound due to recent increased bloating and concerns about ovarian cancer.  D/W pt common reasons for bloating, specifically GI causes.  As well, reviewed that she had abnormal exam 08/20/14.  Pt still very glad to proceed with PUS.  Denies vaginal bleeding or discharge.  Patient's last menstrual period was 09/05/2003.  Sexually active:  no  Contraception: PMP state  FINDINGS: UTERUS: 6.4 x 4.6 c 4.1cm with 2cm and 0.9cm fibroids noted EMS: 3.74mm, symmetric ADNEXA:   Left ovary 2.2 x 1.5 x 0.9cm with 5 and 9mm follicles   Right ovary 1.9 x 1.0 x 1.1cm CUL DE SAC: no free fluid  Images reviewed with pt.  Fibroids discussed.  Have never felt these on exam in the past.  Pt considering whether she wants to repeat PUS or now.  For me, ok to follow with physical exam only.  Pt does have AEX scheduled for next year, already, due to hx of abnormal Pap smear.  Reviewed with pt as well due to several questions regarding this.  All questions answered.  Assessment:  Abdominal bloating, uterine fibroids, small follicles left ovary Plan: Follow up for AEX as previously scheduled for next year.  ~15 minutes spent with patient >50% of time was in face to face discussion of above.

## 2014-09-09 ENCOUNTER — Encounter: Payer: Self-pay | Admitting: Internal Medicine

## 2014-09-09 ENCOUNTER — Ambulatory Visit (INDEPENDENT_AMBULATORY_CARE_PROVIDER_SITE_OTHER): Payer: Medicare Other | Admitting: Internal Medicine

## 2014-09-09 VITALS — BP 118/78 | HR 97 | Ht 64.0 in | Wt 190.4 lb

## 2014-09-09 DIAGNOSIS — J320 Chronic maxillary sinusitis: Secondary | ICD-10-CM

## 2014-09-09 DIAGNOSIS — J45998 Other asthma: Secondary | ICD-10-CM

## 2014-09-09 DIAGNOSIS — G4733 Obstructive sleep apnea (adult) (pediatric): Secondary | ICD-10-CM

## 2014-09-09 MED ORDER — AMOXICILLIN-POT CLAVULANATE 875-125 MG PO TABS: 1.0000 | ORAL_TABLET | Freq: Two times a day (BID) | ORAL | Status: DC

## 2014-09-09 MED ORDER — MOMETASONE FUROATE 50 MCG/ACT NA SUSP: 2.0000 | Freq: Every day | NASAL | Status: DC | PRN

## 2014-09-09 MED ORDER — MONTELUKAST SODIUM 10 MG PO TABS: 10.0000 mg | ORAL_TABLET | Freq: Every day | ORAL | Status: DC

## 2014-09-09 MED ORDER — ALBUTEROL SULFATE HFA 108 (90 BASE) MCG/ACT IN AERS: 2.0000 | INHALATION_SPRAY | RESPIRATORY_TRACT | Status: DC | PRN

## 2014-09-09 MED ORDER — FLUTICASONE-SALMETEROL 100-50 MCG/DOSE IN AEPB: INHALATION_SPRAY | RESPIRATORY_TRACT | Status: DC

## 2014-09-09 NOTE — Progress Notes (Signed)
Patient ID: [name], female    DOB: May 19, 1951, 64 y.o.   MRN: [id number]  HPI 11/28/10- 65 yo F never smoker, using Advair 20, Ventolin HFA, Singulair. Last here acutely ill with URI/ bronchitis September 14, 2009. That resolved. She was on augmentin for a sinusitis, ending a week or so ago. She feels completely well now. She would like an antibiotic script to hold. Using Ventolin rescue inhaler rarely since winter bronchitis cleared. Used some before exercise.   06/27/11-59 yo F never smoker, using Advair 20, Ventolin HFA, Singulair. Last here acutely ill with URI/ bronchitis September 14, 2009. Reports fever for a day or 2 after she had both flu and pneumonia vaccines a month ago. Now feels well, noticing some nasal congestion and right maxillary pressure with sneeze. Mostly she asks medication refills. She likes to keep Augmentin available and we discussed this. She starts to wheeze if she skips Advair.  02/28/12- 38 yo F never smoker folowed for asthma, allergic rhinitis, complicated by hx OSA/ oral appliance, HBP, GERD Pt here today for surgery clearance > pt having knee surgery on July 15th 2013/ Dr Maureen Ralphs. Pt state having no issues  at this time Pending left total knee replacement. 3 weeks ago acute bronchitis did not respond to Biaxin so she went to a walk-in clinic and was treated with amoxicillin. Now denies any routine cough or wheeze. We discussed her history of obstructive sleep apnea. She still snores. She never tried CPAP and never got used to an oral appliance. She does not notice daytime sleepiness much, especially since she retired. Continues Advair twice daily, using a rescue inhaler 2 or 3 times per month. PFT 05/30/10- FEV1 2.56/ 91%, FEV1/FVC 0.73, DLCO 84%  within normal limits.  06/27/12-  32 yo F never smoker folowed for asthma, allergic rhinitis, complicated by hx OSA/ quit oral appliance, HBP, GERD Denies any flare ups or attacks since last visit; doing well  overall. Had left total knee replacement and did well with no respiratory complications. No longer treating sleep apnea. She quit using the oral appliance and is not ready to try again.  07/10/13-  77 yo F never smoker folowed for asthma, allergic rhinitis, complicated by hx OSA/ quit oral appliance, HBP, GERD Follows For: Runny nose, sneezing, itchy throat , occas wheezing - Denies cough or sob Persistent mild tightness, not really wheezy. Some itching and sneezing. Medication talk. Gets flu vaccine at primary physician. Asks about an update sleep study. She had documented sleep apnea in the past. She did not tolerate CPAP or an oral appliance. Aware of snoring and daytime tiredness.  03/04/14- 60 yo F never smoker folowed for asthma, allergic rhinitis, complicated by hx OSA/ quit oral appliance, HBP, GERD self referral .  trouble sleeping, does not feel rested, been told that she snores, unrested Sleep apnea diagnosed in the past and she tried CPAP but could not get comfortable with it Says friends notice for sleeping on trips, but her husband does not notice anything. Some difficulty initiating and maintaining sleep-uses either trazodone, Silenor, lunesta when needed.  Bedtime between 11 PM and 1 AM, sleep latency 15 minutes, waking once or twice before up between 8 and 10 AM. History of sinus surgery, hypertension  05/20/14- 36 yo F never smoker folowed for asthma, allergic rhinitis, complicated by hx OSA/ quit oral appliance, HBP, GERD FOLLOWS FOR: Pt here to review sleep study. Pt c/o constant fatigue.  NPSG-04/21/14- Moderate obstructive sleep apnea, AHI 21/  hr, CPAP to 14, weight 185 lbs.  07/08/14- 37 yo F never smoker folowed for asthma, allergic rhinitis, complicated by hx OSA/ quit oral appliance, HBP, GERD FOLLOWS FOR: Pt states she is wearing CPAP 10/ [facility] nightly and is trying to get 4 hours per night. Pt states it is hard getting used to and is not sleeping well with it. Pt  denies issues with machine and pressure. Pt c/o mask leaking.  Sinus infection-on Augmentin  Review of Systems- see HPI Constitutional:   No-   weight loss, night sweats, fevers, chills, +fatigue, lassitude. HEENT:   No-  headaches, difficulty swallowing, tooth/dental problems, sore throat,       No-  sneezing, itching, ear ache, +nasal congestion, post nasal drip,  CV:  No-   chest pain, orthopnea, PND, swelling in lower extremities, anasarca,  dizziness, palpitations Resp: No-   shortness of breath with exertion or at rest.              No-   productive cough,  No non-productive cough,  No- coughing up of blood.              No-   change in color of mucus.  No- wheezing.   Skin: No-   rash or lesions. GI:  +heartburn, indigestion, abdominal pain, nausea, vomiting, GU: . MS:  See HPI-  joint pain or swelling.   Neuro-     nothing unusual Psych:  No- change in mood or affect. No depression or anxiety.  No memory loss.  OBJ General- Alert, Oriented, Affect-appropriate, Distress- none acute. Looks well. Skin- rash-none, lesions- none, excoriation- none Lymphadenopathy- none Head- atraumatic            Eyes- Gross vision intact, PERRLA, conjunctivae clear secretions            Ears- Hearing, canals-normal            Nose- +turbinate edema, no-Septal dev, mucus-none, no-polyps, erosion, perforation             Throat- Mallampati II-III , mucosa clear , drainage- none, tonsils- atrophic, own teeth Neck- flexible , trachea midline, no stridor , thyroid nl, carotid no bruit Chest - symmetrical excursion , unlabored           Heart/CV- RRR , no murmur , no gallop  , no rub, nl s1 s2                           - JVD- none , edema- none, stasis changes- none, varices- none           Lung-  wheeze-none, cough- none , dullness-none, rub- none           Chest wall-  Abd-  Br/ Gen/ Rectal- Not done, not indicated Extrem- cyanosis- none, clubbing, none, atrophy- none, strength- nl Neuro- grossly  intact to observation

## 2014-09-09 NOTE — Patient Instructions (Signed)
Scripts for refills sent  Script for augmentin sent  Order- [facility]- please show different mask styles. Consider nasal pillows or nasal mask needed for comfort/ compliance  Order- referral to orthodontics Dr Oneal Grout     Oral appliance for OSA  Please call as needed

## 2014-09-11 NOTE — Assessment & Plan Note (Signed)
Acute exacerbation Plan-Augmentin, saline rinse

## 2014-09-11 NOTE — Assessment & Plan Note (Signed)
CPAP pressure was reduced to 10 but she is still not comfortable-does not like having mask on face. We discussed alternatives. Plan-she will talk with [facility] about alternative masks and adjustments for comfort. We are referring to Dr. Ron Parker for consideration of an oral appliance which she might tolerate better, as discussed.

## 2014-09-11 NOTE — Assessment & Plan Note (Signed)
Controlled without recent exacerbation

## 2014-09-15 ENCOUNTER — Telehealth: Payer: Self-pay | Admitting: Internal Medicine

## 2014-09-15 MED ORDER — CLARITHROMYCIN 500 MG PO TABS: 500.0000 mg | ORAL_TABLET | Freq: Two times a day (BID) | ORAL | Status: DC

## 2014-09-15 NOTE — Telephone Encounter (Signed)
Called and spoke to pt. Informed pt of the recs per CY. Drug to drug interaction between Atorvastatin and Advair with the Biaxin. Per CY- ok to continue with Advair like normal but hold the Atorvastatin till after abx is completed. Informed pt of the previous recs. Rx sent to preferred pharmacy. Pt verbalized understanding and denied any further questions or concerns at this time.

## 2014-09-15 NOTE — Telephone Encounter (Signed)
I would like to try antibiotic in a different class Suggest Biaxin 500 mg # 20, 1 twice daily with food

## 2014-09-15 NOTE — Telephone Encounter (Signed)
Pt c/o cough with green/yellow mucus with chest congestion and chest tightness at times.  Pt states that she has a sinus infection x 11 days and she states that it has never really gone away. Feels that it has moved into lungs -- URI. Pt states that she has completed Augmentin today. Would like rec's. Allergies  Allergen Reactions  . Demerol [Meperidine] Nausea And Vomiting  . Meloxicam Other (See Comments)    Blood count went down   . Nadolol Other (See Comments)    Low blood pressure    Please advise Dr Annamaria Boots. Thanks.     Medication List       This list is accurate as of: 09/15/14  4:56 PM.  Always use your most recent med list.               albuterol 108 (90 BASE) MCG/ACT inhaler  Commonly known as:  VENTOLIN HFA  Inhale 2 puffs into the lungs every 4 (four) hours as needed for wheezing or shortness of breath.     amLODipine 5 MG tablet  Commonly known as:  NORVASC  Take 5 mg by mouth daily before breakfast.     amoxicillin-clavulanate 875-125 MG per tablet  Commonly known as:  AUGMENTIN  Take 1 tablet by mouth 2 (two) times daily.     atorvastatin 40 MG tablet  Commonly known as:  LIPITOR  Take 40 mg by mouth daily.     azelastine 0.1 % nasal spray  Commonly known as:  ASTELIN  1-2 sprays in each nostril QHS     CALCIUM PO  Take 1,000 mg by mouth daily.     cetirizine 10 MG tablet  Commonly known as:  ZYRTEC  Take 10 mg by mouth daily as needed. Allergies     conjugated estrogens vaginal cream  Commonly known as:  PREMARIN  Place vaginally 2 (two) times a week.     cyclobenzaprine 10 MG tablet  Commonly known as:  FLEXERIL  as needed.     esomeprazole 40 MG packet  Commonly known as:  NEXIUM  Take 40 mg by mouth daily before breakfast.     estradiol-norethindrone 0.05-0.25 MG/DAY  Commonly known as:  COMBIPATCH  APPLY 1 PATCH TWO TIMES A WEEK AS DIRECTED     eszopiclone 1 MG Tabs tablet  Commonly known as:  LUNESTA  Take 3 tablets (3 mg total)  by mouth at bedtime as needed. Sleep     Fluticasone-Salmeterol 100-50 MCG/DOSE Aepb  Commonly known as:  ADVAIR DISKUS  1 puff then rinse mouth, twice daily- maintenance     FORTEO 600 MCG/2.4ML Soln  Generic drug:  Teriparatide (Recombinant)     magnesium gluconate 500 MG tablet  Commonly known as:  MAGONATE  Take 500 mg by mouth at bedtime as needed.     mometasone 50 MCG/ACT nasal spray  Commonly known as:  NASONEX  Place 2 sprays into the nose daily as needed. Allergies     montelukast 10 MG tablet  Commonly known as:  SINGULAIR  Take 1 tablet (10 mg total) by mouth at bedtime.     multivitamin tablet  Take 1 tablet by mouth daily. Contains calcium     NON FORMULARY  C-pap Q HS     PROBIOTIC DAILY PO  Take 1 tablet by mouth daily.     traZODone 50 MG tablet  Commonly known as:  DESYREL  Take one tablet at bedtime     valACYclovir 500 MG  tablet  Commonly known as:  VALTREX  Take 1 daily as directed, increasing to BID for 3 days with symptoms     VITAMIN D PO  Take 5,000 Int'l Units by mouth daily.

## 2015-01-08 ENCOUNTER — Ambulatory Visit: Payer: Medicare Other | Admitting: Internal Medicine

## 2015-01-09 ENCOUNTER — Other Ambulatory Visit: Payer: Self-pay | Admitting: Obstetrics & Gynecology

## 2015-01-11 NOTE — Telephone Encounter (Signed)
Medication refill request: Valtrex  Last AEX:  01/01/14 SM Next AEX: 01/21/15 SM Last MMG (if hormonal medication request): 09/10/13 [id number]:Neg Refill authorized: 01/01/14 #45tabs/ 12R. Today #45tabs/0R?

## 2015-01-21 ENCOUNTER — Ambulatory Visit (INDEPENDENT_AMBULATORY_CARE_PROVIDER_SITE_OTHER): Payer: Medicare Other | Admitting: Obstetrics & Gynecology

## 2015-01-21 ENCOUNTER — Encounter: Payer: Self-pay | Admitting: Obstetrics & Gynecology

## 2015-01-21 VITALS — BP 148/72 | HR 68 | Resp 12 | Ht 64.5 in | Wt 184.0 lb

## 2015-01-21 DIAGNOSIS — Z124 Encounter for screening for malignant neoplasm of cervix: Secondary | ICD-10-CM

## 2015-01-21 DIAGNOSIS — Z01419 Encounter for gynecological examination (general) (routine) without abnormal findings: Secondary | ICD-10-CM

## 2015-01-21 MED ORDER — VALACYCLOVIR HCL 500 MG PO TABS: 500.0000 mg | ORAL_TABLET | Freq: Every day | ORAL | Status: DC

## 2015-01-21 MED ORDER — ESTRADIOL-NORETHINDRONE ACET 0.05-0.25 MG/DAY TD PTTW: MEDICATED_PATCH | TRANSDERMAL | Status: DC

## 2015-01-21 MED ORDER — ESTROGENS, CONJUGATED 0.625 MG/GM VA CREA: TOPICAL_CREAM | VAGINAL | Status: DC

## 2015-01-21 NOTE — Progress Notes (Signed)
64 y.o. G1P1 MarriedCaucasianF here for annual exam.  Recently changed to mouth advised with OSA.  Reports issues with facial pressure and greenish/bloody nasal drainage and discharge.  Has antibiotic on hand from Dr. Annamaria Boots.  Going to get abx today to start for this.  Reports bloating has improved since stopping [place] products.  No vaginal bleeding.    PCP:  Dr. Leonides Schanz.  Appt Jan with blood work was normal.    Patient's last menstrual period was 09/05/2003.          Sexually active: No.  The current method of family planning is post menopausal status.    Exercising: Yes.    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 Chalmers Cater.  Done end of June. TDaP:  2009 Screening Labs: PCP, Hb today: PCP, Urine today: PCP   reports that she has never smoked. She has never used smokeless tobacco. She reports that she drinks alcohol. She reports that she does not use illicit drugs.  Past Medical History  Diagnosis Date  . Acid reflux   . High cholesterol   . Dysrhythmia     PVC's with caffeine and anxiety  . Sleep apnea     no CPAP/ last study 9 yrs ago- "mild per patient"  . Heart murmur     since childhood  . Arthritis   . Hypertension     OV with clearance and note Dr Addison Lank 6/13 chart  . Asthma     LOV  6/13  Dr Annamaria Boots EPIC/ clearance on chart  from 10/12  . Hiatal hernia   . Osteopenia   . Osteoporosis   . Depression   . Stye 06/2009    eye  . STD (sexually transmitted disease)     HSV2    Past Surgical History  Procedure Laterality Date  . Cesarean section    . Breast reduction surgery    . Nasal septum surgery    . Bilateral hip replacement Bilateral 6/07, 9/07    right, then left  . Total knee arthroplasty  03/18/2012    Procedure: TOTAL KNEE ARTHROPLASTY;  Surgeon: Gearlean Alf, MD;  Location: [facility];  Service: Orthopedics;  Laterality: Left;    Current Outpatient Prescriptions  Medication Sig Dispense Refill  . albuterol (VENTOLIN HFA) 108 (90 BASE) MCG/ACT inhaler Inhale 2 puffs into the lungs every 4 (four) hours as needed for wheezing or shortness of breath. 1 Inhaler prn  . amLODipine (NORVASC) 5 MG tablet Take 5 mg by mouth daily before breakfast.     . atorvastatin (LIPITOR) 40 MG tablet Take 40 mg by mouth daily.    Marland Kitchen azelastine (ASTELIN) 0.1 % nasal spray 1-2 sprays in each nostril QHS 30 mL 6  . CALCIUM PO Take 1,000 mg by mouth daily.    . cetirizine (ZYRTEC) 10 MG tablet Take 10 mg by mouth daily as needed. Allergies    . Cholecalciferol (VITAMIN D PO) Take 5,000 Int'l Units by mouth daily.    Marland Kitchen conjugated estrogens (PREMARIN) vaginal cream Place vaginally 2 (two) times a week. 42.5 g 3  . cyclobenzaprine (FLEXERIL) 10 MG tablet as needed.    Marland Kitchen esomeprazole (NEXIUM) 40 MG packet Take 40 mg by mouth daily before breakfast.     . estradiol-norethindrone (COMBIPATCH) 0.05-0.25 MG/DAY  APPLY 1 PATCH TWO TIMES A WEEK AS DIRECTED 8 patch 12  . Fluticasone-Salmeterol (ADVAIR DISKUS) 100-50 MCG/DOSE AEPB 1 puff then rinse mouth, twice daily- maintenance 180 each 3  . FORTEO 600 MCG/2.4ML SOLN     . magnesium gluconate (MAGONATE) 500 MG tablet Take 500 mg by mouth at bedtime as needed.    . mometasone (NASONEX) 50 MCG/ACT nasal spray Place 2 sprays into the nose daily as needed. Allergies 17 g prn  . montelukast (SINGULAIR) 10 MG tablet Take 1 tablet (10 mg total) by mouth at bedtime. 90 tablet 3  . Multiple Vitamin (MULTIVITAMIN) tablet Take 1 tablet by mouth daily. Contains calcium    . NON FORMULARY C-pap Q HS    . Probiotic Product (PROBIOTIC DAILY PO) Take 1 tablet by mouth daily.    . Suvorexant (BELSOMRA PO) Take by mouth daily.    . valACYclovir (VALTREX) 500 MG tablet TAKE 1 TABLET DAILY AS DIRECTED. INCREASE TO TWICE A DAY FOR 3 DAYS WITH  SYMPTOMS 45 tablet 0   No current facility-administered medications for this visit.    Family History  Problem Relation Age of Onset  . Heart disease Father   . Cancer Father   . Arthritis Mother   . Pancreatic cancer    . Coronary artery disease Other   . Cancer Other   . Asthma Other   . Hypertension Mother   . Hypertension Father   . Nephritis Daughter     glomerular  . Kidney Stones Father     ROS:  Pertinent items are noted in HPI.  Otherwise, a comprehensive ROS was negative.  Exam:    General appearance: alert, cooperative and appears stated age Head: Normocephalic, without obvious abnormality, atraumatic Neck: no adenopathy, supple, symmetrical, trachea midline and thyroid normal to inspection and palpation Lungs: clear to auscultation bilaterally Breasts: normal appearance, no masses or tenderness Heart: regular rate and rhythm Abdomen: soft, non-tender; bowel sounds normal; no masses,  no organomegaly Extremities: extremities normal, atraumatic, no cyanosis or edema Skin: Skin color, texture, turgor normal. No rashes or lesions Lymph nodes: Cervical, supraclavicular, and axillary nodes normal. No abnormal inguinal nodes palpated Neurologic: Grossly normal   Pelvic: External genitalia:  no lesions              Urethra:  normal appearing urethra with no masses, tenderness or lesions              Bartholins and Skenes: normal                 Vagina: normal appearing vagina with normal color and discharge, no lesions              Cervix: no lesions              Pap taken: Yes.   Bimanual Exam:  Uterus:  normal size, contour, position, consistency, mobility, non-tender              Adnexa: normal adnexa and no mass, fullness, tenderness               Rectovaginal: Confirms               Anus:  normal sphincter tone, no lesions  Chaperone was present for exam.  A:  Well Woman with normal exam Osteoporosis, on Forteo. Feels this should end in July.  has follow  up appt scheduled.   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  Had colposcopy.  P: Mammogram yearly. Pt did in 1/15.  Pt knows this is overdue. pap smear with HR HPV today On Combipath 50/140, 1/2 patch twice weekly. Rx to pharmacy.  Pt not using faithfully so is going to stop and see if has any symptoms.  She requested Rx just in case. Premarin vaginal cream 1/2 gram twice weekly. Rx to pharmacy. Valtrex 500mg  daily, increase to BID x 3 days. #45/12RF. Sees Dr. Addison Lank twice yearly return annually or prn

## 2015-01-26 LAB — IPS PAP TEST WITH HPV

## 2015-02-02 ENCOUNTER — Telehealth: Payer: Self-pay

## 2015-02-02 NOTE — Telephone Encounter (Signed)
Lmtcb//kn 

## 2015-02-02 NOTE — Telephone Encounter (Signed)
-----   Message from Megan Salon, MD sent at 01/27/2015  7:46 AM EDT ----- Please inform pt Pap was negative and HR HPV was negative.  Had abnormal Pap and HR HPV 1 year ago.  Needs to be out of current recall and 08 recall for next year.

## 2015-02-03 ENCOUNTER — Ambulatory Visit (INDEPENDENT_AMBULATORY_CARE_PROVIDER_SITE_OTHER): Payer: Medicare Other | Admitting: Internal Medicine

## 2015-02-03 ENCOUNTER — Encounter: Payer: Self-pay | Admitting: Internal Medicine

## 2015-02-03 VITALS — BP 118/82 | HR 91 | Ht 64.0 in | Wt 178.0 lb

## 2015-02-03 DIAGNOSIS — J320 Chronic maxillary sinusitis: Secondary | ICD-10-CM

## 2015-02-03 DIAGNOSIS — G4733 Obstructive sleep apnea (adult) (pediatric): Secondary | ICD-10-CM | POA: Diagnosis not present

## 2015-02-03 MED ORDER — AMOXICILLIN-POT CLAVULANATE 875-125 MG PO TABS: 1.0000 | ORAL_TABLET | Freq: Two times a day (BID) | ORAL | Status: DC

## 2015-02-03 MED ORDER — DOXYCYCLINE HYCLATE 100 MG PO TABS: 100.0000 mg | ORAL_TABLET | Freq: Two times a day (BID) | ORAL | Status: DC

## 2015-02-03 NOTE — Progress Notes (Signed)
Patient ID: [name], female    DOB: May 26, 1951, 64 y.o.   MRN: [id number]  HPI 11/28/10- 9 yo F never smoker, using Advair 20, Ventolin HFA, Singulair. Last here acutely ill with URI/ bronchitis September 14, 2009. That resolved. She was on augmentin for a sinusitis, ending a week or so ago. She feels completely well now. She would like an antibiotic script to hold. Using Ventolin rescue inhaler rarely since winter bronchitis cleared. Used some before exercise.   06/27/11-59 yo F never smoker, using Advair 20, Ventolin HFA, Singulair. Last here acutely ill with URI/ bronchitis September 14, 2009. Reports fever for a day or 2 after she had both flu and pneumonia vaccines a month ago. Now feels well, noticing some nasal congestion and right maxillary pressure with sneeze. Mostly she asks medication refills. She likes to keep Augmentin available and we discussed this. She starts to wheeze if she skips Advair.  02/28/12- 65 yo F never smoker folowed for asthma, allergic rhinitis, complicated by hx OSA/ oral appliance, HBP, GERD Pt here today for surgery clearance > pt having knee surgery on July 15th 2013/ Dr Maureen Ralphs. Pt state having no issues  at this time Pending left total knee replacement. 3 weeks ago acute bronchitis did not respond to Biaxin so she went to a walk-in clinic and was treated with amoxicillin. Now denies any routine cough or wheeze. We discussed her history of obstructive sleep apnea. She still snores. She never tried CPAP and never got used to an oral appliance. She does not notice daytime sleepiness much, especially since she retired. Continues Advair twice daily, using a rescue inhaler 2 or 3 times per month. PFT 05/30/10- FEV1 2.56/ 91%, FEV1/FVC 0.73, DLCO 84%  within normal limits.  06/27/12-  61 yo F never smoker folowed for asthma, allergic rhinitis, complicated by hx OSA/ quit oral appliance, HBP, GERD Denies any flare ups or attacks since last visit; doing well  overall. Had left total knee replacement and did well with no respiratory complications. No longer treating sleep apnea. She quit using the oral appliance and is not ready to try again.  07/10/13-  30 yo F never smoker folowed for asthma, allergic rhinitis, complicated by hx OSA/ quit oral appliance, HBP, GERD Follows For: Runny nose, sneezing, itchy throat , occas wheezing - Denies cough or sob Persistent mild tightness, not really wheezy. Some itching and sneezing. Medication talk. Gets flu vaccine at primary physician. Asks about an update sleep study. She had documented sleep apnea in the past. She did not tolerate CPAP or an oral appliance. Aware of snoring and daytime tiredness.  03/04/14- 37 yo F never smoker folowed for asthma, allergic rhinitis, complicated by hx OSA/ quit oral appliance, HBP, GERD self referral .  trouble sleeping, does not feel rested, been told that she snores, unrested Sleep apnea diagnosed in the past and she tried CPAP but could not get comfortable with it Says friends notice for sleeping on trips, but her husband does not notice anything. Some difficulty initiating and maintaining sleep-uses either trazodone, Silenor, lunesta when needed.  Bedtime between 11 PM and 1 AM, sleep latency 15 minutes, waking once or twice before up between 8 and 10 AM. History of sinus surgery, hypertension  05/20/14- 58 yo F never smoker folowed for asthma, allergic rhinitis, complicated by hx OSA/ quit oral appliance, HBP, GERD FOLLOWS FOR: Pt here to review sleep study. Pt c/o constant fatigue.  NPSG-04/21/14- Moderate obstructive sleep apnea, AHI 21/  hr, CPAP to 14, weight 185 lbs.  07/08/14- 55 yo F never smoker folowed for asthma, allergic rhinitis, complicated by hx OSA/ quit oral appliance, HBP, GERD FOLLOWS FOR: Pt states she is wearing CPAP 10/ Advanced nightly and is trying to get 4 hours per night. Pt states it is hard getting used to and is not sleeping well with it. Pt  denies issues with machine and pressure. Pt c/o mask leaking.  Sinus infection-on Augmentin  02/03/15- 37yo F never smoker folowed for asthma, allergic rhinitis, complicated by hx OSA/ oral appliance, HBP, GERD Reports: Pt. has been on antibiotic  x2 days wants 10day course instead of 7 day,  Acute left maxillary sinusitis partially treated with Augmentin when she stopped after 7 days symptoms flared. Now on second round and improving again. She has changed CPAP to oral appliance/Dr. Ron Parker  Review of Systems- see HPI Constitutional:   No-   weight loss, night sweats, fevers, chills, +fatigue, lassitude. HEENT:   +  headaches, difficulty swallowing, tooth/dental problems, sore throat,       No-  sneezing, itching, ear ache, +nasal congestion, post nasal drip,  CV:  No-   chest pain, orthopnea, PND, swelling in lower extremities, anasarca,  dizziness, palpitations Resp: No-   shortness of breath with exertion or at rest.              No-   productive cough,  No non-productive cough,  No- coughing up of blood.              No-   change in color of mucus.  No- wheezing.   Skin: No-   rash or lesions. GI:  +heartburn, indigestion, abdominal pain, nausea, vomiting, GU: . MS:  See HPI-  joint pain or swelling.   Neuro-     nothing unusual Psych:  No- change in mood or affect. No depression or anxiety.  No memory loss.  OBJ General- Alert, Oriented, Affect-appropriate, Distress- none acute. Looks well. Skin- rash-none, lesions- none, excoriation- none Lymphadenopathy- none Head- atraumatic            Eyes- Gross vision intact, PERRLA, conjunctivae clear secretions            Ears- Hearing, canals-normal            Nose- clear, no-Septal dev, mucus-none, no-polyps, erosion, perforation             Throat- Mallampati II-III , mucosa clear , drainage- none, tonsils- atrophic, own teeth Neck- flexible , trachea midline, no stridor , thyroid nl, carotid no bruit Chest - symmetrical excursion ,  unlabored           Heart/CV- RRR , no murmur , no gallop  , no rub, nl s1 s2                           - JVD- none , edema- none, stasis changes- none, varices- none           Lung-  wheeze-none, cough- none , dullness-none, rub- none           Chest wall-  Abd-  Br/ Gen/ Rectal- Not done, not indicated Extrem- cyanosis- none, clubbing, none, atrophy- none, strength- nl Neuro- grossly intact to observation

## 2015-02-03 NOTE — Patient Instructions (Signed)
Scripts printed for 2 antibiotics to choose between if needed- augmentin and doxycycline.   Please call as needed  Order- [facility]- D/C CPAP

## 2015-02-03 NOTE — Telephone Encounter (Signed)
Patient notified of results. See lab 

## 2015-02-04 ENCOUNTER — Telehealth: Payer: Self-pay | Admitting: Obstetrics & Gynecology

## 2015-02-04 NOTE — Telephone Encounter (Signed)
Spoke with Ysidro Evert at [employer]. Verified patient will apply 1/2 gram of Premarin vaginal cream twice per week. Ysidro Evert is agreeable and will make this change to rx for refill.  Routing to provider for final review. Patient agreeable to disposition. Will close encounter.

## 2015-02-04 NOTE — Telephone Encounter (Signed)
Pharmacist called for clarification on vaginal cream that was ordered 01/21/15. Instructions were written to place vaginally 2x a week. Insurance needs to know how many grams per week.   Pharmacist: Almyra Free

## 2015-02-06 NOTE — Assessment & Plan Note (Signed)
Recurrent acute maxillary sinusitis Plan-Augmentin for 10 days with 3 refills. Doxycycline to have available as an alternative as discussed.

## 2015-02-06 NOTE — Assessment & Plan Note (Signed)
An oral appliance is reasonable for her. So far she seems comfortable pending a home sleep assessment.

## 2015-03-03 ENCOUNTER — Telehealth: Payer: Self-pay | Admitting: Obstetrics & Gynecology

## 2015-03-03 NOTE — Telephone Encounter (Signed)
[facility] is calling to get clarification on a prescription. Please return a call to [facility] at [phone]

## 2015-03-03 NOTE — Telephone Encounter (Signed)
Call from [facility], [place], requesting to hold to speak with nurse due to insurance company being on the line. Calling to confirm dose of Premarin Vaginal cream. States insurance company has called multiple times and unable to get resolution. Per chart note, on 02-04-15, [name] advised [name] at [facility] that Premarin cream should be 1/2 gram twice weekly.  Routing to provider for final review. Patient agreeable to disposition. Will close encounter.

## 2015-03-21 ENCOUNTER — Telehealth: Payer: Self-pay | Admitting: Obstetrics & Gynecology

## 2015-03-21 NOTE — Telephone Encounter (Signed)
Opened in error

## 2015-06-01 ENCOUNTER — Telehealth: Payer: Self-pay | Admitting: Internal Medicine

## 2015-06-01 MED ORDER — PREDNISONE 10 MG PO TABS
ORAL_TABLET | ORAL | Status: DC
Start: 2015-06-01 — End: 2015-08-11

## 2015-06-01 NOTE — Telephone Encounter (Signed)
LMTC x 1  

## 2015-06-01 NOTE — Telephone Encounter (Signed)
Pt went to pick up HST with oral appliance with Dr Ron Parker today but was told not to start it due to lots of nasal congestion, sneezing, runny nose.  Denies cough, sob or wheezing.  Pt was advised to call Dr Annamaria Boots.  Pt taking nasonex daily, astelin prn, singulair daily and Zyrtec and Benadryl prn.  Dr Ron Parker had mentioned to ask Dr Annamaria Boots about pt seeing an ENT possibly.  Pt reports h/o deviated septum in 1985.  Please advise.  Allergies  Allergen Reactions  . Demerol [Meperidine] Nausea And Vomiting  . Meloxicam Other (See Comments)    Blood count went down   . Nadolol Other (See Comments)    Low blood pressure     Current Outpatient Prescriptions on File Prior to Visit  Medication Sig Dispense Refill  . albuterol (VENTOLIN HFA) 108 (90 BASE) MCG/ACT inhaler Inhale 2 puffs into the lungs every 4 (four) hours as needed for wheezing or shortness of breath. 1 Inhaler prn  . amLODipine (NORVASC) 5 MG tablet Take 5 mg by mouth daily before breakfast.     . atorvastatin (LIPITOR) 40 MG tablet Take 40 mg by mouth daily.    Marland Kitchen azelastine (ASTELIN) 0.1 % nasal spray 1-2 sprays in each nostril QHS 30 mL 6  . CALCIUM PO Take 1,000 mg by mouth daily.    . cetirizine (ZYRTEC) 10 MG tablet Take 10 mg by mouth daily as needed. Allergies    . Cholecalciferol (VITAMIN D PO) Take 5,000 Int'l Units by mouth daily.    Marland Kitchen conjugated estrogens (PREMARIN) vaginal cream Place vaginally 2 (two) times a week. 42.5 g 3  . cyclobenzaprine (FLEXERIL) 10 MG tablet as needed.    Marland Kitchen esomeprazole (NEXIUM) 40 MG packet Take 40 mg by mouth daily before breakfast.     . Fluticasone-Salmeterol (ADVAIR DISKUS) 100-50 MCG/DOSE AEPB 1 puff then rinse mouth, twice daily- maintenance 180 each 3  . FORTEO 600 MCG/2.4ML SOLN     . magnesium gluconate (MAGONATE) 500 MG tablet Take 500 mg by mouth at bedtime as needed.    . mometasone (NASONEX) 50 MCG/ACT nasal spray Place 2 sprays into the nose daily as needed. Allergies 17 g prn  .  montelukast (SINGULAIR) 10 MG tablet Take 1 tablet (10 mg total) by mouth at bedtime. 90 tablet 3  . Multiple Vitamin (MULTIVITAMIN) tablet Take 1 tablet by mouth daily. Contains calcium    . NON FORMULARY C-pap Q HS    . Probiotic Product (PROBIOTIC DAILY PO) Take 1 tablet by mouth daily.    . Suvorexant (BELSOMRA PO) Take by mouth daily.    . valACYclovir (VALTREX) 500 MG tablet Take 1 tablet (500 mg total) by mouth daily. Increased to BID x 3 days with sympotms 45 tablet 12   No current facility-administered medications on file prior to visit.

## 2015-06-01 NOTE — Telephone Encounter (Signed)
[phone] Jerilynn Mages)

## 2015-06-01 NOTE — Telephone Encounter (Signed)
If nasal congestion has just started with fall weather, then offer prednisone 10 mg, # 7,  2 today then one daily. Ok to continue her other meds.

## 2015-06-01 NOTE — Telephone Encounter (Signed)
Spoke with pt. Aware of recs. RX sent in. Nothing further needed 

## 2015-06-14 ENCOUNTER — Other Ambulatory Visit: Payer: Self-pay | Admitting: Internal Medicine

## 2015-07-05 ENCOUNTER — Other Ambulatory Visit: Payer: Self-pay | Admitting: Internal Medicine

## 2015-07-05 NOTE — Telephone Encounter (Signed)
Electronic refill request received for Augmentin Last ov 6.1.16 w/ CY at which time refills on Augmentin were given #20 with 1 additional as well as Doxycycline 100mg  @#20 with 1 additional refill Per last ov: Patient Instructions     Scripts printed for 2 antibiotics to choose between if needed- augmentin and doxycycline.   Please call as needed  Order- [facility]- D/C CPAP    Pt is scheduled to see CY 12.7.16 for 6 month follow up   Dr Annamaria Boots please advise if okay to fill, thank you.

## 2015-07-05 NOTE — Telephone Encounter (Signed)
Ok to refill 

## 2015-07-14 ENCOUNTER — Other Ambulatory Visit: Payer: Self-pay | Admitting: Internal Medicine

## 2015-08-11 ENCOUNTER — Ambulatory Visit (INDEPENDENT_AMBULATORY_CARE_PROVIDER_SITE_OTHER): Payer: Medicare Other | Admitting: Internal Medicine

## 2015-08-11 ENCOUNTER — Encounter: Payer: Self-pay | Admitting: Internal Medicine

## 2015-08-11 VITALS — BP 116/72 | HR 80 | Ht 64.0 in | Wt 195.0 lb

## 2015-08-11 DIAGNOSIS — R0981 Nasal congestion: Secondary | ICD-10-CM | POA: Diagnosis not present

## 2015-08-11 DIAGNOSIS — G4733 Obstructive sleep apnea (adult) (pediatric): Secondary | ICD-10-CM | POA: Diagnosis not present

## 2015-08-11 DIAGNOSIS — Z23 Encounter for immunization: Secondary | ICD-10-CM

## 2015-08-11 DIAGNOSIS — J310 Chronic rhinitis: Secondary | ICD-10-CM | POA: Diagnosis not present

## 2015-08-11 MED ORDER — AMOXICILLIN-POT CLAVULANATE 875-125 MG PO TABS: 1.0000 | ORAL_TABLET | Freq: Two times a day (BID) | ORAL | Status: DC

## 2015-08-11 NOTE — Patient Instructions (Signed)
Try sniff of Benzedrix sample nasal decongestant each nostril for a night or two to see if that makes it easier for you to breathe with your oral appliance in place  Order- referral to [facility] for persistent nasal congestion interfering with oral appliance for sleep apnea  Flu vax  Script sent for augmentin to hold  You can use an otc nasal saline gel as needed for dry nose

## 2015-08-11 NOTE — Assessment & Plan Note (Signed)
Having trouble with compliance with her mouthpiece because of nasal congestion complaints. She will keep trying but is referred to ENT. If there is a correctable basis for her nasal stuffiness complaint, hopefully she can then use oral appliance routinely.

## 2015-08-11 NOTE — Assessment & Plan Note (Signed)
Complains of perennial nasal stuffiness. I do not see polyps anteriorly. This bothers her enough that she has trouble wearing oral appliance for sleep apnea. Plan-gave sample Benzedrex nasal inhalant to try for one or 2 evenings only. See if that makes it easier to wear mouthpiece. Referral to ENT for more detailed evaluation and she is instructed to tell that Dr. about her experience with nasal decongestant.

## 2015-08-11 NOTE — Progress Notes (Signed)
Patient ID: [name], female    DOB: May 26, 1951, 64 y.o.   MRN: [id number]  HPI 11/28/10- 9 yo F never smoker, using Advair 20, Ventolin HFA, Singulair. Last here acutely ill with URI/ bronchitis September 14, 2009. That resolved. She was on augmentin for a sinusitis, ending a week or so ago. She feels completely well now. She would like an antibiotic script to hold. Using Ventolin rescue inhaler rarely since winter bronchitis cleared. Used some before exercise.   06/27/11-59 yo F never smoker, using Advair 20, Ventolin HFA, Singulair. Last here acutely ill with URI/ bronchitis September 14, 2009. Reports fever for a day or 2 after she had both flu and pneumonia vaccines a month ago. Now feels well, noticing some nasal congestion and right maxillary pressure with sneeze. Mostly she asks medication refills. She likes to keep Augmentin available and we discussed this. She starts to wheeze if she skips Advair.  02/28/12- 65 yo F never smoker folowed for asthma, allergic rhinitis, complicated by hx OSA/ oral appliance, HBP, GERD Pt here today for surgery clearance > pt having knee surgery on July 15th 2013/ Dr Maureen Ralphs. Pt state having no issues  at this time Pending left total knee replacement. 3 weeks ago acute bronchitis did not respond to Biaxin so she went to a walk-in clinic and was treated with amoxicillin. Now denies any routine cough or wheeze. We discussed her history of obstructive sleep apnea. She still snores. She never tried CPAP and never got used to an oral appliance. She does not notice daytime sleepiness much, especially since she retired. Continues Advair twice daily, using a rescue inhaler 2 or 3 times per month. PFT 05/30/10- FEV1 2.56/ 91%, FEV1/FVC 0.73, DLCO 84%  within normal limits.  06/27/12-  61 yo F never smoker folowed for asthma, allergic rhinitis, complicated by hx OSA/ quit oral appliance, HBP, GERD Denies any flare ups or attacks since last visit; doing well  overall. Had left total knee replacement and did well with no respiratory complications. No longer treating sleep apnea. She quit using the oral appliance and is not ready to try again.  07/10/13-  30 yo F never smoker folowed for asthma, allergic rhinitis, complicated by hx OSA/ quit oral appliance, HBP, GERD Follows For: Runny nose, sneezing, itchy throat , occas wheezing - Denies cough or sob Persistent mild tightness, not really wheezy. Some itching and sneezing. Medication talk. Gets flu vaccine at primary physician. Asks about an update sleep study. She had documented sleep apnea in the past. She did not tolerate CPAP or an oral appliance. Aware of snoring and daytime tiredness.  03/04/14- 37 yo F never smoker folowed for asthma, allergic rhinitis, complicated by hx OSA/ quit oral appliance, HBP, GERD self referral .  trouble sleeping, does not feel rested, been told that she snores, unrested Sleep apnea diagnosed in the past and she tried CPAP but could not get comfortable with it Says friends notice for sleeping on trips, but her husband does not notice anything. Some difficulty initiating and maintaining sleep-uses either trazodone, Silenor, lunesta when needed.  Bedtime between 11 PM and 1 AM, sleep latency 15 minutes, waking once or twice before up between 8 and 10 AM. History of sinus surgery, hypertension  05/20/14- 58 yo F never smoker folowed for asthma, allergic rhinitis, complicated by hx OSA/ quit oral appliance, HBP, GERD FOLLOWS FOR: Pt here to review sleep study. Pt c/o constant fatigue.  NPSG-04/21/14- Moderate obstructive sleep apnea, AHI 21/  hr, CPAP to 14, weight 185 lbs.  07/08/14- 29 yo F never smoker folowed for asthma, allergic rhinitis, complicated by hx OSA/ quit oral appliance, HBP, GERD FOLLOWS FOR: Pt states she is wearing CPAP 10/ Advanced nightly and is trying to get 4 hours per night. Pt states it is hard getting used to and is not sleeping well with it. Pt  denies issues with machine and pressure. Pt c/o mask leaking.  Sinus infection-on Augmentin  02/03/15- 64yo F never smoker folowed for asthma, allergic rhinitis, complicated by hx OSA/ oral appliance, HBP, GERD Reports: Pt. has been on antibiotic  x2 days wants 10day course instead of 7 day,  Acute left maxillary sinusitis partially treated with Augmentin when she stopped after 7 days symptoms flared. Now on second round and improving again. She has changed CPAP to oral appliance/Dr. Ron Parker  08/11/2015-64 year old female never smoker followed for asthma, allergic rhinitis, OSA/oral appliance/Dr. Ron Parker, complicated by HBP, GERD FOLLOWS FOR: Pt no longer wearing CPAP; was given oral appliance-did another sleep test with oral appliance and was not able to get clear results (HST done by Ron Parker office) due to stopped up nasal area while wearing oral appliance. She says Wear oral appliance well because of perennial nasal congestion. Nasonex/Astelin nasal spray some help but always stuffy. Remote repair deviated septum. Asthma control good. Rare need rescue inhaler. Never slept well with CPAP and had difficulty with compliance. Asks to keep Augmentin prescription available this winter.  Review of Systems- see HPI Constitutional:   No-   weight loss, night sweats, fevers, chills, +fatigue, lassitude. HEENT:   +  headaches, difficulty swallowing, tooth/dental problems, sore throat,       No-  sneezing, itching, ear ache, +nasal congestion, post nasal drip,  CV:  No-   chest pain, orthopnea, PND, swelling in lower extremities, anasarca,  dizziness, palpitations Resp: No-   shortness of breath with exertion or at rest.              No-   productive cough,  No non-productive cough,  No- coughing up of blood.              No-   change in color of mucus.  No- wheezing.   Skin: No-   rash or lesions. GI:  +heartburn, indigestion, abdominal pain, nausea, vomiting, GU: . MS:  See HPI-  joint pain or swelling.    Neuro-     nothing unusual Psych:  No- change in mood or affect. No depression or anxiety.  No memory loss.  OBJ General- Alert, Oriented, Affect-appropriate, Distress- none acute. Looks well. Skin- rash-none, lesions- none, excoriation- none Lymphadenopathy- none Head- atraumatic            Eyes- Gross vision intact, PERRLA, conjunctivae clear secretions            Ears- Hearing, canals-normal            Nose- clear, no-Septal dev, mucus-none, no-polyps, erosion, perforation             Throat- Mallampati II-III , mucosa clear , drainage- none, tonsils- atrophic, own teeth Neck- flexible , trachea midline, no stridor , thyroid nl, carotid no bruit Chest - symmetrical excursion , unlabored           Heart/CV- RRR , no murmur , no gallop  , no rub, nl s1 s2                           -  JVD- none , edema- none, stasis changes- none, varices- none           Lung-  wheeze-none, cough- none , dullness-none, rub- none           Chest wall-  Abd-  Br/ Gen/ Rectal- Not done, not indicated Extrem- cyanosis- none, clubbing, none, atrophy- none, strength- nl Neuro- grossly intact to observation

## 2015-08-16 ENCOUNTER — Other Ambulatory Visit: Payer: Self-pay | Admitting: Internal Medicine

## 2015-09-05 ENCOUNTER — Other Ambulatory Visit: Payer: Self-pay | Admitting: Internal Medicine

## 2015-09-26 ENCOUNTER — Other Ambulatory Visit: Payer: Self-pay | Admitting: Internal Medicine

## 2015-11-06 ENCOUNTER — Other Ambulatory Visit: Payer: Self-pay | Admitting: Internal Medicine

## 2015-11-19 ENCOUNTER — Other Ambulatory Visit: Payer: Self-pay | Admitting: *Deleted

## 2015-11-19 MED ORDER — MONTELUKAST SODIUM 10 MG PO TABS: ORAL_TABLET | ORAL | Status: DC

## 2015-11-24 ENCOUNTER — Telehealth: Payer: Self-pay | Admitting: Internal Medicine

## 2015-11-24 NOTE — Telephone Encounter (Signed)
Spoke with patient, needs another abx, requests something other than Augmentin. Just finished 10 day course of Augmentin yesterday 11/23/15 which a refill given from initial Rx in Dec 2016.  Pt c/o chest tightness and cough with very little mucus production, chest congestion, runny nose, PND and SOB.  Pt states that she is unable to get any mucus up and her chest is so tight that it hurts to cough and laugh. Feels that she needs a stronger abx. Recommended that patient come in and be seen by NP Eric Form at 10:30 on 11/25/15 to be evaluated. appt scheduled. Nothing further needed.

## 2015-11-25 ENCOUNTER — Telehealth: Payer: Self-pay | Admitting: Acute Care

## 2015-11-25 ENCOUNTER — Ambulatory Visit (INDEPENDENT_AMBULATORY_CARE_PROVIDER_SITE_OTHER)
Admission: RE | Admit: 2015-11-25 | Discharge: 2015-11-25 | Disposition: A | Discharge status: Home / Self Care | Payer: Medicare Other | Source: Ambulatory Visit | Attending: Acute Care | Admitting: Acute Care

## 2015-11-25 ENCOUNTER — Ambulatory Visit (INDEPENDENT_AMBULATORY_CARE_PROVIDER_SITE_OTHER): Payer: Medicare Other | Admitting: Acute Care

## 2015-11-25 ENCOUNTER — Encounter: Payer: Self-pay | Admitting: Acute Care

## 2015-11-25 VITALS — BP 162/90 | HR 83 | Temp 97.6°F | Ht 64.5 in | Wt 197.0 lb

## 2015-11-25 DIAGNOSIS — R059 Cough, unspecified: Secondary | ICD-10-CM

## 2015-11-25 DIAGNOSIS — J320 Chronic maxillary sinusitis: Secondary | ICD-10-CM | POA: Diagnosis not present

## 2015-11-25 DIAGNOSIS — R05 Cough: Secondary | ICD-10-CM

## 2015-11-25 DIAGNOSIS — J209 Acute bronchitis, unspecified: Secondary | ICD-10-CM | POA: Diagnosis not present

## 2015-11-25 MED ORDER — HYDROCODONE-HOMATROPINE 5-1.5 MG/5ML PO SYRP: 5.0000 mL | ORAL_SOLUTION | Freq: Two times a day (BID) | ORAL | Status: DC | PRN

## 2015-11-25 MED ORDER — PREDNISONE 10 MG PO TABS: ORAL_TABLET | ORAL | Status: DC

## 2015-11-25 MED ORDER — CLARITHROMYCIN 500 MG PO TABS: 500.0000 mg | ORAL_TABLET | Freq: Two times a day (BID) | ORAL | Status: DC

## 2015-11-25 NOTE — Progress Notes (Signed)
Subjective:    Patient ID: [name], female    DOB: June 19, 1951, 65 y.o.   MRN: [id number]  HPI -65 year old female never smoker followed for asthma, allergic rhinitis,Chronic sinusitis, OSA/oral appliance/Dr. Ron Parker, complicated by HBP, GERD.  Significant events/procedures:  11/25/2015  EXAM: CHEST 2 VIEW  Personally reviewed by me  COMPARISON: PA and lateral chest x-ray of March 12, 2012  FINDINGS: The lungs are adequately inflated. The interstitial markings are slightly more conspicuous diffusely than on the previous study. There is no alveolar infiltrate. There is no pleural effusion. The cardiac silhouette is mildly enlarged. The pulmonary vascularity is not clearly engorged. There is tortuosity of the descending thoracic aorta. There is mild multilevel degenerative disc disease of the thoracic spine.  IMPRESSION: Mild interstitial prominence of both lungs which is nonspecific. This could reflect acute bronchitic change. There is no evidence of pulmonary vascular congestion.  11/25/2015: Acute office visit: Patient presents to the office today having recently taken Augmentin for sinus infection. She states that sinus infection has cleared, nasal mucus has now returned to clear white. Nasal congestion has cleared. She has noticed in the last few days increased chest congestion, chest tightness, with a change in pulmonary secretions to a yellow-green color. Positive for productive cough. Chills at times, and occasional wheezing. She feels the infection has moved from her sinuses to her chest. Chest x-ray today reviewed personally by me indicates no pneumonia but possible acute bronchitis.  She denies chest pain, orthopnea, hemoptysis. No recent automobile or airline travel.   Current outpatient prescriptions:  .  ADVAIR DISKUS 100-50 MCG/DOSE AEPB, INHALE 1 PUFF BY MOUTH TWICE DAILY THEN RINSE MOUTH, Disp: 180 each, Rfl: 2 .  amLODipine (NORVASC) 5 MG tablet, Take  5 mg by mouth daily before breakfast. , Disp: , Rfl:  .  atorvastatin (LIPITOR) 40 MG tablet, Take 40 mg by mouth daily., Disp: , Rfl:  .  azelastine (ASTELIN) 0.1 % nasal spray, USE 1-2 SPRAYS IN EACH NOSTRIL AT BEDTIME, Disp: 16 mL, Rfl: 2 .  CALCIUM PO, Take 1,000 mg by mouth daily., Disp: , Rfl:  .  cetirizine (ZYRTEC) 10 MG tablet, Take 10 mg by mouth daily as needed. Allergies, Disp: , Rfl:  .  Cholecalciferol (VITAMIN D PO), Take 5,000 Int'l Units by mouth daily., Disp: , Rfl:  .  cloNIDine (CATAPRES) 0.1 MG tablet, Take 0.1 mg by mouth at bedtime., Disp: , Rfl:  .  cyclobenzaprine (FLEXERIL) 10 MG tablet, as needed., Disp: , Rfl:  .  esomeprazole (NEXIUM) 40 MG packet, Take 40 mg by mouth daily before breakfast. , Disp: , Rfl:  .  Eszopiclone 3 MG TABS, Take 1.5 mg by mouth at bedtime. Take immediately before bedtime, Disp: , Rfl:  .  magnesium gluconate (MAGONATE) 500 MG tablet, Take 500 mg by mouth at bedtime as needed., Disp: , Rfl:  .  mometasone (NASONEX) 50 MCG/ACT nasal spray, USE 2 SPRAYS INTO EACH NOSTRIL EVERY DAY AS NEEDED FOR ALLERGIES, Disp: 17 g, Rfl: 9 .  montelukast (SINGULAIR) 10 MG tablet, TAKE 1 TABLET (10 MG TOTAL) BY MOUTH AT BEDTIME., Disp: 30 tablet, Rfl: 3 .  Multiple Vitamin (MULTIVITAMIN) tablet, Take 1 tablet by mouth daily. Contains calcium, Disp: , Rfl:  .  Probiotic Product (PROBIOTIC DAILY PO), Take 1 tablet by mouth daily., Disp: , Rfl:  .  valACYclovir (VALTREX) 500 MG tablet, Take 1 tablet (500 mg total) by mouth daily. Increased to BID x 3 days with sympotms,  Disp: 45 tablet, Rfl: 12 .  VENTOLIN HFA 108 (90 Base) MCG/ACT inhaler, INHALE 2 PUFFS INTO THE LUNGS EVERY 4 (FOUR) HOURS AS NEEDED FOR WHEEZING OR SHORTNESS OF BREATH., Disp: 18 Inhaler, Rfl: 1 .  clarithromycin (BIAXIN) 500 MG tablet, Take 1 tablet (500 mg total) by mouth 2 (two) times daily., Disp: 20 tablet, Rfl: 0 .  HYDROcodone-homatropine (HYCODAN) 5-1.5 MG/5ML syrup, Take 5 mLs by mouth 2  (two) times daily as needed for cough., Disp: 120 mL, Rfl: 0 .  predniSONE (DELTASONE) 10 MG tablet, 40mg X2 days, 30mg  X2 days, 20mg  X2 days, 10mg X2 days, then stop., Disp: 20 tablet, Rfl: 0   Past Medical History  Diagnosis Date  . Acid reflux   . High cholesterol   . Dysrhythmia     PVC's with caffeine and anxiety  . Sleep apnea     no CPAP/ last study 9 yrs ago- "mild per patient"  . Heart murmur     since childhood  . Arthritis   . Hypertension     OV with clearance and note Dr Addison Lank 6/13 chart  . Asthma     LOV  6/13  Dr Annamaria Boots EPIC/ clearance on chart  from 10/12  . Hiatal hernia   . Osteopenia   . Osteoporosis   . Depression   . Stye 06/2009    eye  . STD (sexually transmitted disease)     HSV2    Allergies  Allergen Reactions  . Demerol [Meperidine] Nausea And Vomiting  . Meloxicam Other (See Comments)    Blood count went down   . Nadolol Other (See Comments)    Low blood pressure    Review of Systems Constitutional:   No  weight loss, night sweats, +Fevers, +chills,No fatigue, or  lassitude.  HEENT:   No headaches,  Difficulty swallowing,  Tooth/dental problems, or  Sore throat,                No sneezing, itching, ear ache, nasal congestion, post nasal drip,   CV:  No chest pain,  Orthopnea, PND, swelling in lower extremities, anasarca, dizziness, palpitations, syncope.   GI  No heartburn, indigestion, abdominal pain, nausea, vomiting, diarrhea, change in bowel habits, loss of appetite, bloody stools.   Resp: No shortness of breath with exertion or at rest.  +excess mucus, + productive cough,  No non-productive cough,  No coughing up of blood.  + change in color of mucus.  + wheezing.  No chest wall deformity  Skin: no rash or lesions.  GU: no dysuria, change in color of urine, no urgency or frequency.  No flank pain, no hematuria   MS:  No joint pain or swelling.  No decreased range of motion.  No back pain.  Psych:  No change in mood or affect. No  depression or anxiety.  No memory loss.          Objective:   Physical Exam BP 162/90 mmHg  Pulse 83  Temp(Src) 97.6 F (36.4 C) (Oral)  Ht 5' 4.5" (1.638 m)  Wt 197 lb (89.359 kg)  BMI 33.31 kg/m2  SpO2 98%  LMP 09/05/2003   Physical Exam:  General- No distress,  A&Ox3 ENT: No sinus tenderness, TM clear, pale nasal mucosa, no oral exudate,no post nasal drip, no LAN Cardiac: S1, S2, regular rate and rhythm, no murmur Chest:  + wheeze/ no rales/ dullness; no accessory muscle use, no nasal flaring, no sternal retractions Abd.: Soft Non-tender Ext: No clubbing cyanosis,  edema Neuro:  normal strength Skin: No rashes, warm and dry Psych: normal mood and behavior  Magdalen Spatz, AGACNP-BC [place] Pager # [phone]  11/25/2015     Assessment & Plan:

## 2015-11-25 NOTE — Telephone Encounter (Signed)
lmtcb X1 to make pt aware of cxr results.  Per SG: cxr neg for pna, + for bronchitis, continue meds prescribed at ov today.

## 2015-11-25 NOTE — Assessment & Plan Note (Signed)
Recent sinusitis treated with Augmentin by Dr. Annamaria Boots. Plan: Resolved at present Continue to monitor for relapse

## 2015-11-25 NOTE — Patient Instructions (Addendum)
We will do a CXR today. We will call you with results. Prednisone taper; 10 mg tablets: 4 tabs x 2 days, 3 tabs x 2 days, 2 tabs x 2 days 1 tab x 2 days then stop. Mucinex 1-2 tablets every 12 hours. Hydromet cough syrup 5 mls for cough twice daily as needed. This can make you sleepy Don't drive after taking. Biaxin 500 mg #20 one tablet 2 times daily x 10 days. Continue using your saline rinses. Continue your Advailr 150 Continue your singulair. See Dr. Annamaria Boots 02/16/16 as already scheduled. Please contact office for sooner follow up if symptoms do not improve or worsen or seek emergency care

## 2015-11-25 NOTE — Assessment & Plan Note (Signed)
Bronchitis flare:  Plan: We will do a CXR today. Personally reviewed by me. We will call you with results. Prednisone taper; 10 mg tablets: 4 tabs x 2 days, 3 tabs x 2 days, 2 tabs x 2 days 1 tab x 2 days then stop. Mucinex 1-2 tablets every 12 hours. Hydromet cough syrup 5 mls for cough twice daily as needed. This can make you sleepy Don't drive after taking. Biaxin 500 mg #20 one tablet 2 times daily x 10 days. Continue using your saline rinses. Continue your Advailr 150 Continue your singulair. See Dr. Annamaria Boots 02/16/16 as already scheduled. Please contact office for sooner follow up if symptoms do not improve or worsen or seek emergency care

## 2015-11-25 NOTE — Telephone Encounter (Signed)
Spoke with pt, aware of results/recs.  Nothing further needed.  

## 2016-01-02 ENCOUNTER — Other Ambulatory Visit: Payer: Self-pay | Admitting: Internal Medicine

## 2016-01-05 ENCOUNTER — Telehealth: Payer: Self-pay | Admitting: Obstetrics & Gynecology

## 2016-01-05 NOTE — Telephone Encounter (Signed)
LMTCB about canceled appt/rd °

## 2016-01-20 ENCOUNTER — Telehealth: Payer: Self-pay | Admitting: Obstetrics & Gynecology

## 2016-01-20 NOTE — Telephone Encounter (Signed)
Left message on voicemail to call and reschedule cancelled appointment. °

## 2016-01-24 ENCOUNTER — Ambulatory Visit (INDEPENDENT_AMBULATORY_CARE_PROVIDER_SITE_OTHER): Payer: Medicare Other | Admitting: Obstetrics & Gynecology

## 2016-01-24 ENCOUNTER — Encounter: Payer: Self-pay | Admitting: Obstetrics & Gynecology

## 2016-01-24 ENCOUNTER — Ambulatory Visit: Payer: Medicare Other | Admitting: Obstetrics & Gynecology

## 2016-01-24 VITALS — BP 134/76 | HR 64 | Temp 100.4°F | Resp 18 | Ht 64.0 in | Wt 196.4 lb

## 2016-01-24 DIAGNOSIS — Z01419 Encounter for gynecological examination (general) (routine) without abnormal findings: Secondary | ICD-10-CM | POA: Diagnosis not present

## 2016-01-24 DIAGNOSIS — R87612 Low grade squamous intraepithelial lesion on cytologic smear of cervix (LGSIL): Secondary | ICD-10-CM | POA: Diagnosis not present

## 2016-01-24 DIAGNOSIS — Z0000 Encounter for general adult medical examination without abnormal findings: Secondary | ICD-10-CM

## 2016-01-24 DIAGNOSIS — D729 Disorder of white blood cells, unspecified: Secondary | ICD-10-CM

## 2016-01-24 DIAGNOSIS — R509 Fever, unspecified: Secondary | ICD-10-CM | POA: Diagnosis not present

## 2016-01-24 DIAGNOSIS — N870 Mild cervical dysplasia: Secondary | ICD-10-CM | POA: Diagnosis not present

## 2016-01-24 LAB — COMPREHENSIVE METABOLIC PANEL
ALBUMIN: 4.1 g/dL (ref 3.6–5.1)
ALT: 23 U/L (ref 6–29)
AST: 41 U/L — ABNORMAL HIGH (ref 10–35)
Alkaline Phosphatase: 129 U/L (ref 33–130)
BUN: 12 mg/dL (ref 7–25)
CALCIUM: 9.1 mg/dL (ref 8.6–10.4)
CHLORIDE: 99 mmol/L (ref 98–110)
CO2: 24 mmol/L (ref 20–31)
CREATININE: 0.91 mg/dL (ref 0.50–0.99)
Glucose, Bld: 112 mg/dL — ABNORMAL HIGH (ref 65–99)
POTASSIUM: 4.2 mmol/L (ref 3.5–5.3)
SODIUM: 137 mmol/L (ref 135–146)
TOTAL PROTEIN: 6.7 g/dL (ref 6.1–8.1)
Total Bilirubin: 0.6 mg/dL (ref 0.2–1.2)

## 2016-01-24 LAB — CBC WITH DIFFERENTIAL/PLATELET
BASOS ABS: 0 {cells}/uL (ref 0–200)
Basophils Relative: 0 %
EOS PCT: 1 %
Eosinophils Absolute: 148 cells/uL (ref 15–500)
HCT: 39.5 % (ref 35.0–45.0)
HEMOGLOBIN: 12.6 g/dL (ref 11.7–15.5)
LYMPHS ABS: 888 {cells}/uL (ref 850–3900)
LYMPHS PCT: 6 %
MCH: 23.2 pg — AB (ref 27.0–33.0)
MCHC: 31.9 g/dL — AB (ref 32.0–36.0)
MCV: 72.6 fL — ABNORMAL LOW (ref 80.0–100.0)
MPV: 9.2 fL (ref 7.5–12.5)
Monocytes Absolute: 888 cells/uL (ref 200–950)
Monocytes Relative: 6 %
NEUTROS PCT: 87 %
Neutro Abs: 12876 cells/uL — ABNORMAL HIGH (ref 1500–7800)
Platelets: 278 10*3/uL (ref 140–400)
RBC: 5.44 MIL/uL — AB (ref 3.80–5.10)
RDW: 17.3 % — AB (ref 11.0–15.0)
WBC: 14.8 10*3/uL — AB (ref 3.8–10.8)

## 2016-01-24 LAB — POCT URINALYSIS DIPSTICK
Bilirubin, UA: NEGATIVE
Glucose, UA: NEGATIVE
KETONES UA: NEGATIVE
Leukocytes, UA: NEGATIVE
Nitrite, UA: NEGATIVE
PH UA: 8
PROTEIN UA: NEGATIVE
RBC UA: NEGATIVE
UROBILINOGEN UA: NEGATIVE

## 2016-01-24 MED ORDER — VALACYCLOVIR HCL 500 MG PO TABS: 500.0000 mg | ORAL_TABLET | Freq: Every day | ORAL | Status: DC

## 2016-01-24 NOTE — Progress Notes (Signed)
65 y.o. G1P1 MarriedCaucasianF here for annual exam.  Pt reports she is not feeling well this morning.  She is having abdominal discomfort this morning.  She felt like she had a little temp this morning and took it before she came.  It was about 100 degrees.  No diarrhea.  No constipation.  Does take benefiber every day.  No dysuria.    Stopped combipatch and doing well without it.  Doesn't really have hot flashes.  Just completed a bridge tournament.    Patient's last menstrual period was 09/05/2003.          Sexually active: No.  The current method of family planning is none.    Exercising: Yes.    walking and swimming Smoker:  no  Health Maintenance: Pap:  01/21/2015  History of abnormal Pap:  no MMG:  02/05/2015 BIRADS 1 negative Colonoscopy:  03/11/2014 repeat in 3 years  BMD:  08/20/2015 repeat 2 years TDaP:  2009 Zostavax:  Completed Pneumonia vaccines:  x2 completed Hep C testing done 2014 Screening Labs: PCP, Hb today: PCP, Urine today: pending   reports that she has never smoked. She has never used smokeless tobacco. She reports that she drinks alcohol. She reports that she does not use illicit drugs.  Past Medical History  Diagnosis Date  . Acid reflux   . High cholesterol   . Dysrhythmia     PVC's with caffeine and anxiety  . Sleep apnea     no CPAP/ last study 9 yrs ago- "mild per patient"  . Heart murmur     since childhood  . Arthritis   . Hypertension     OV with clearance and note Dr Addison Lank 6/13 chart  . Asthma     LOV  6/13  Dr Annamaria Boots EPIC/ clearance on chart  from 10/12  . Hiatal hernia   . Osteopenia   . Osteoporosis   . Depression   . Stye 06/2009    eye  . STD (sexually transmitted disease)     HSV2    Past Surgical History  Procedure Laterality Date  . Cesarean section    . Breast reduction surgery    . Nasal septum surgery    . Bilateral hip replacement Bilateral 6/07, 9/07    right, then left  . Total knee arthroplasty  03/18/2012   Procedure: TOTAL KNEE ARTHROPLASTY;  Surgeon: Gearlean Alf, MD;  Location: [facility];  Service: Orthopedics;  Laterality: Left;    Current Outpatient Prescriptions  Medication Sig Dispense Refill  . ADVAIR DISKUS 100-50 MCG/DOSE AEPB INHALE 1 PUFF BY MOUTH TWICE DAILY THEN RINSE MOUTH 180 each 2  . amLODipine (NORVASC) 5 MG tablet Take 5 mg by mouth daily before breakfast.     . atorvastatin (LIPITOR) 40 MG tablet Take 40 mg by mouth daily.    Marland Kitchen azelastine (ASTELIN) 0.1 % nasal spray USE 1-2 SPRAYS IN EACH NOSTRIL AT BEDTIME 30 mL 2  . CALCIUM PO Take 1,000 mg by mouth daily.    . cetirizine (ZYRTEC) 10 MG tablet Take 10 mg by mouth daily as needed. Allergies    . Cholecalciferol (VITAMIN D PO) Take 5,000 Int'l Units by mouth daily.    . clarithromycin (BIAXIN) 500 MG tablet Take 1 tablet (500 mg total) by mouth 2 (two) times daily. 20 tablet 0  . cloNIDine (CATAPRES) 0.1 MG tablet Take 0.1 mg by mouth at bedtime.    . cyclobenzaprine (FLEXERIL) 10 MG tablet as needed.    Marland Kitchen  esomeprazole (NEXIUM) 40 MG packet Take 40 mg by mouth daily before breakfast.     . Eszopiclone 3 MG TABS Take 1.5 mg by mouth at bedtime. Take immediately before bedtime    . HYDROcodone-homatropine (HYCODAN) 5-1.5 MG/5ML syrup Take 5 mLs by mouth 2 (two) times daily as needed for cough. 120 mL 0  . magnesium gluconate (MAGONATE) 500 MG tablet Take 500 mg by mouth at bedtime as needed.    . mometasone (NASONEX) 50 MCG/ACT nasal spray USE 2 SPRAYS INTO EACH NOSTRIL EVERY DAY AS NEEDED FOR ALLERGIES 17 g 9  . montelukast (SINGULAIR) 10 MG tablet TAKE 1 TABLET (10 MG TOTAL) BY MOUTH AT BEDTIME. 30 tablet 3  . Multiple Vitamin (MULTIVITAMIN) tablet Take 1 tablet by mouth daily. Contains calcium    . predniSONE (DELTASONE) 10 MG tablet 40mg X2 days, 30mg  X2 days, 20mg  X2 days, 10mg X2 days, then stop. 20 tablet 0  . Probiotic Product (PROBIOTIC DAILY PO) Take 1 tablet by mouth daily.    . valACYclovir (VALTREX) 500 MG tablet  Take 1 tablet (500 mg total) by mouth daily. Increased to BID x 3 days with sympotms 45 tablet 12  . VENTOLIN HFA 108 (90 Base) MCG/ACT inhaler INHALE 2 PUFFS INTO THE LUNGS EVERY 4 (FOUR) HOURS AS NEEDED FOR WHEEZING OR SHORTNESS OF BREATH. 18 Inhaler 1   No current facility-administered medications for this visit.    Family History  Problem Relation Age of Onset  . Heart disease Father   . Cancer Father   . Arthritis Mother   . Pancreatic cancer    . Coronary artery disease Other   . Cancer Other   . Asthma Other   . Hypertension Mother   . Hypertension Father   . Nephritis Daughter     glomerular  . Kidney Stones Father     ROS:  Pertinent items are noted in HPI.  Otherwise, a comprehensive ROS was negative.  Exam:   Filed Vitals:   01/24/16 1047  BP: 134/76  Pulse: 64  Temp: 100.4 F (38 C)  Resp: 18  Height: 5\' 4"  (1.626 m)  Weight: 196 lb 6.4 oz (89.086 kg)   General appearance: alert, cooperative and appears stated age Head: Normocephalic, without obvious abnormality, atraumatic Neck: no adenopathy, supple, symmetrical, trachea midline and thyroid normal to inspection and palpation Lungs: clear to auscultation bilaterally Breasts: normal appearance, no masses or tenderness Heart: regular rate and rhythm Abdomen: soft, non-tender; bowel sounds normal; no masses,  no organomegaly Extremities: extremities normal, atraumatic, no cyanosis or edema Skin: Skin color, texture, turgor normal. No rashes or lesions Lymph nodes: Cervical, supraclavicular, and axillary nodes normal. No abnormal inguinal nodes palpated Neurologic: Grossly normal   Pelvic: External genitalia:  no lesions              Urethra:  normal appearing urethra with no masses, tenderness or lesions              Bartholins and Skenes: normal                 Vagina: normal appearing vagina with normal color and discharge, no lesions, second degree cystocele noted              Cervix: no lesions               Pap taken: Yes.   Bimanual Exam:  Uterus:  normal size, contour, position, consistency, mobility, non-tender  Adnexa: normal adnexa and no mass, fullness, tenderness               Rectovaginal: Confirms               Anus:  normal sphincter tone, no lesions  Chaperone was present for exam.  A:  Well Woman with normal exam Off HRT Osteoporosis, completed two years of Forteo.  Seeing Dr. Chalmers Cater.  Will have BMD in December Fever today, no significant findings on examination  Elevated lipids Hypertension Asthma H/O LGSIL pap and CIN 1 2015.  Neg pap and neg HR HPV 2015.   Second degree cystocele with hx of incomplete emptying  P: Mammogram yearly. Pt did in 1/15. Pt knows this is overdue. pap smear with HR HPV today.  If normal, pt is back to routine screening. Valtrex 500mg  daily, increase to BID x 3 days. #45/12RF. Sees Dr. Addison Lank twice yearly CBC with diff and CMP today Urine culture pending Pt aware to go to urgent care or see PCP if she has any change in symptoms while blood work and urine culture result return annually or prn

## 2016-01-25 ENCOUNTER — Other Ambulatory Visit: Payer: Self-pay | Admitting: Obstetrics & Gynecology

## 2016-01-25 LAB — URINE CULTURE

## 2016-01-26 NOTE — Addendum Note (Signed)
Addended by: Megan Salon on: 01/26/2016 08:29 AM   Modules accepted: Orders, SmartSet

## 2016-01-27 ENCOUNTER — Telehealth: Payer: Self-pay | Admitting: Emergency Medicine

## 2016-01-27 LAB — IPS PAP TEST WITH HPV

## 2016-01-27 NOTE — Telephone Encounter (Signed)
Message left to return call to [place] at [phone] to home [phone] and Mobile [phone].

## 2016-01-27 NOTE — Telephone Encounter (Signed)
-----   Message from Megan Salon, MD sent at 01/26/2016  8:28 AM EDT ----- Please notify pt that her WBC ct is elevated.  She has a low grade temp on Monday.  I did check with a hematologist--Dr. Ahmed Prima her WBC ct and was advised to repeat in one to two weeks to make sure it normalizes.  This could be viral.  There was no source of infection found during her exam on Monday.  Urine culture was negative.  CMP was normal except for one liver enzyme which can be caused by a virus.  Please check on her and schedule repeat lab work.  Orders placed.  Thanks.

## 2016-01-28 NOTE — Telephone Encounter (Signed)
Patient returned call. She reports she is feeling much better. No fevers.  She is given lab results message from Dr. Sabra Heck, She is agreeable to follow up lab tests but will be out of town in two weeks. She is scheduled for lab work 02/14/16 and will call back or seek care if develops any fever or any concerning symptoms.  Routing to provider for final review. Patient agreeable to disposition. Will close encounter.

## 2016-02-09 ENCOUNTER — Ambulatory Visit: Payer: Medicare Other | Admitting: Internal Medicine

## 2016-02-14 ENCOUNTER — Other Ambulatory Visit: Payer: Self-pay | Admitting: *Deleted

## 2016-02-14 ENCOUNTER — Other Ambulatory Visit (INDEPENDENT_AMBULATORY_CARE_PROVIDER_SITE_OTHER): Payer: Medicare Other

## 2016-02-14 DIAGNOSIS — R899 Unspecified abnormal finding in specimens from other organs, systems and tissues: Secondary | ICD-10-CM

## 2016-02-14 DIAGNOSIS — D729 Disorder of white blood cells, unspecified: Secondary | ICD-10-CM

## 2016-02-14 LAB — COMPLETE METABOLIC PANEL WITH GFR
ALBUMIN: 4.1 g/dL (ref 3.6–5.1)
ALK PHOS: 132 U/L — AB (ref 33–130)
ALT: 18 U/L (ref 6–29)
AST: 31 U/L (ref 10–35)
BILIRUBIN TOTAL: 0.4 mg/dL (ref 0.2–1.2)
BUN: 12 mg/dL (ref 7–25)
CO2: 26 mmol/L (ref 20–31)
Calcium: 9.5 mg/dL (ref 8.6–10.4)
Chloride: 102 mmol/L (ref 98–110)
Creat: 0.9 mg/dL (ref 0.50–0.99)
GFR, EST AFRICAN AMERICAN: 77 mL/min (ref >=60)
GFR, EST NON AFRICAN AMERICAN: 67 mL/min (ref >=60)
Glucose, Bld: 102 mg/dL — ABNORMAL HIGH (ref 65–99)
Potassium: 4.4 mmol/L (ref 3.5–5.3)
Sodium: 141 mmol/L (ref 135–146)
TOTAL PROTEIN: 6.5 g/dL (ref 6.1–8.1)

## 2016-02-14 LAB — CBC WITH DIFFERENTIAL/PLATELET
BASOS ABS: 44 {cells}/uL (ref 0–200)
Basophils Relative: 1 %
EOS ABS: 176 {cells}/uL (ref 15–500)
Eosinophils Relative: 4 %
HEMATOCRIT: 38.7 % (ref 35.0–45.0)
HEMOGLOBIN: 12.1 g/dL (ref 11.7–15.5)
LYMPHS ABS: 1892 {cells}/uL (ref 850–3900)
Lymphocytes Relative: 43 %
MCH: 22.4 pg — AB (ref 27.0–33.0)
MCHC: 31.3 g/dL — ABNORMAL LOW (ref 32.0–36.0)
MCV: 71.8 fL — AB (ref 80.0–100.0)
MPV: 9.1 fL (ref 7.5–12.5)
Monocytes Absolute: 440 cells/uL (ref 200–950)
Monocytes Relative: 10 %
NEUTROS ABS: 1848 {cells}/uL (ref 1500–7800)
Neutrophils Relative %: 42 %
Platelets: 287 10*3/uL (ref 140–400)
RBC: 5.39 MIL/uL — ABNORMAL HIGH (ref 3.80–5.10)
RDW: 17.9 % — ABNORMAL HIGH (ref 11.0–15.0)
WBC: 4.4 10*3/uL (ref 3.8–10.8)

## 2016-02-16 ENCOUNTER — Ambulatory Visit: Payer: Medicare Other | Admitting: Internal Medicine

## 2016-02-24 ENCOUNTER — Ambulatory Visit (INDEPENDENT_AMBULATORY_CARE_PROVIDER_SITE_OTHER): Payer: Medicare Other | Admitting: Internal Medicine

## 2016-02-24 ENCOUNTER — Encounter: Payer: Self-pay | Admitting: Internal Medicine

## 2016-02-24 VITALS — BP 136/72 | HR 93 | Ht 64.5 in | Wt 196.6 lb

## 2016-02-24 DIAGNOSIS — G4733 Obstructive sleep apnea (adult) (pediatric): Secondary | ICD-10-CM

## 2016-02-24 DIAGNOSIS — J3089 Other allergic rhinitis: Secondary | ICD-10-CM | POA: Diagnosis not present

## 2016-02-24 DIAGNOSIS — J320 Chronic maxillary sinusitis: Secondary | ICD-10-CM | POA: Diagnosis not present

## 2016-02-24 DIAGNOSIS — J302 Other seasonal allergic rhinitis: Secondary | ICD-10-CM

## 2016-02-24 DIAGNOSIS — J45998 Other asthma: Secondary | ICD-10-CM | POA: Diagnosis not present

## 2016-02-24 MED ORDER — METHYLPREDNISOLONE ACETATE 80 MG/ML IJ SUSP
80.0000 mg | Freq: Once | INTRAMUSCULAR | Status: AC
  Administered 2016-02-24: 80 mg via INTRAMUSCULAR

## 2016-02-24 MED ORDER — ALBUTEROL SULFATE HFA 108 (90 BASE) MCG/ACT IN AERS: 2.0000 | INHALATION_SPRAY | Freq: Four times a day (QID) | RESPIRATORY_TRACT | Status: DC | PRN

## 2016-02-24 NOTE — Progress Notes (Signed)
Patient ID: [name], female    DOB: 02/07/1951, 65 y.o.   MRN: [id number]  HPI .   F never smoker folowed for asthma, allergic rhinitis, complicated by hx OSA/ oral appliance, HBP, GERD PFT 05/30/10- FEV1 2.56/ 91%, FEV1/FVC 0.73, DLCO 84%  within normal limits. NPSG-04/21/14- Moderate obstructive sleep apnea, AHI 21/ hr, CPAP to 14, weight 185 lbs    08/11/2015-65 year old female never smoker followed for asthma, allergic rhinitis, OSA/oral appliance/Dr. Ron Parker, complicated by HBP, GERD FOLLOWS FOR: Pt no longer wearing CPAP; was given oral appliance-did another sleep test with oral appliance and was not able to get clear results (HST done by Ron Parker office) due to stopped up nasal area while wearing oral appliance. She says Wear oral appliance well because of perennial nasal congestion. Nasonex/Astelin nasal spray some help but always stuffy. Remote repair deviated septum. Asthma control good. Rare need rescue inhaler. Never slept well with CPAP and had difficulty with compliance. Asks to keep Augmentin prescription available this winter.  02/24/2016-65 year old female never smoker followed for asthma, allergic rhinitis, OSA/oral appliance/Dr. Ron Parker, complicated by HBP, GERD LOV 11/25/15- given prednisone taper, cough syrup, Biaxin FOLLOWS FOR: Pt states she is having flare up of allergies at this time-itchy and watery eyes. Itchy throat at times as well.  recent itching in throat with watering eyes and nose. She questions mold becase of wet weather. Chest eel okay Using Zyrtec an occasional Benadryl, azelastine nasal spray ad Nasonex. CXR 11/25/2015 IMPRESSION: Mild interstitial prominence of both lungs which is nonspecific. This could reflect acute bronchitic change. There is no evidence of pulmonary vascular codngestion. Electronically Signed  By: David Martinique M.D.  On: 11/25/2015 11:25  Review of Systems- see HPI Constitutional:   No-   weight loss, night sweats, fevers,  chills, +fatigue, lassitude. HEENT:   +  headaches, difficulty swallowing, tooth/dental problems, sore throat,       No-  sneezing, itching, ear ache, +nasal congestion, post nasal drip,  CV:  No-   chest pain, orthopnea, PND, swelling in lower extremities, anasarca,  dizziness, palpitations Resp: No-   shortness of breath with exertion or at rest.              No-   productive cough,  No non-productive cough,  No- coughing up of blood.              No-   change in color of mucus.  No- wheezing.   Skin: No-   rash or lesions. GI:  +heartburn, indigestion, abdominal pain, nausea, vomiting, GU: . MS:  See HPI-  joint pain or swelling.   Neuro-     nothing unusual Psych:  No- change in mood or affect. No depression or anxiety.  No memory loss.  OBJ General- Alert, Oriented, Affect-appropriate, Distress- none acute. Looks well. Skin- rash-none, lesions- none, excoriation- none Lymphadenopathy- none Head- atraumatic            Eyes- Gross vision intact, PERRLA, conjunctivae clear secretions            Ears- Hearing, canals-normal            Nose- clear, no-Septal dev, mucus-none, no-polyps, erosion, perforation             Throat- Mallampati II-III , mucosa clear , drainage- none, tonsils- atrophic, own teeth Neck- flexible , trachea midline, no stridor , thyroid nl, carotid no bruit Chest - symmetrical excursion , unlabored  Heart/CV- RRR , no murmur , no gallop  , no rub, nl s1 s2                           - JVD- none , edema- none, stasis changes- none, varices- none           Lung-  wheeze-none, cough- none , dullness-none, rub- none           Chest wall-  Abd-  Br/ Gen/ Rectal- Not done, not indicated Extrem- cyanosis- none, clubbing, none, atrophy- none, strength- nl Neuro- grossly intact to observation

## 2016-02-24 NOTE — Patient Instructions (Signed)
Depo 80   Dx allergic rhinitis  Please call as needed for refills  We will try to help you figure out which albuterol rescue inhaler your insurance prefers and send script for that.

## 2016-02-29 ENCOUNTER — Encounter: Payer: Self-pay | Admitting: Obstetrics & Gynecology

## 2016-03-22 ENCOUNTER — Other Ambulatory Visit: Payer: Self-pay | Admitting: *Deleted

## 2016-03-22 MED ORDER — AZELASTINE HCL 0.1 % NA SOLN: NASAL | Status: DC

## 2016-03-22 NOTE — Telephone Encounter (Signed)
Rx refilled  Nothing further needed.  

## 2016-03-31 ENCOUNTER — Other Ambulatory Visit: Payer: Self-pay | Admitting: Internal Medicine

## 2016-04-06 ENCOUNTER — Ambulatory Visit: Payer: Medicare Other | Admitting: Obstetrics & Gynecology

## 2016-05-15 ENCOUNTER — Other Ambulatory Visit: Payer: Self-pay | Admitting: Internal Medicine

## 2016-05-16 NOTE — Telephone Encounter (Signed)
02-24-16 last OV for sinusitis. Please advise if okay to refill abx request. Thanks.

## 2016-05-16 NOTE — Telephone Encounter (Signed)
Okay to refill this time and twice more

## 2016-06-12 ENCOUNTER — Other Ambulatory Visit: Payer: Self-pay | Admitting: Internal Medicine

## 2016-06-20 ENCOUNTER — Other Ambulatory Visit: Payer: Self-pay | Admitting: *Deleted

## 2016-06-20 MED ORDER — MONTELUKAST SODIUM 10 MG PO TABS: ORAL_TABLET | ORAL | 3 refills | Status: DC

## 2016-07-02 NOTE — Assessment & Plan Note (Signed)
No recent exacerbation despite increased nasal symptoms.

## 2016-07-02 NOTE — Assessment & Plan Note (Signed)
Still has oral appliance which I think she uses most of the time

## 2016-07-02 NOTE — Assessment & Plan Note (Signed)
Nasal symptoms recently, seasonal or weather related  plan-Depo- Medrol

## 2016-07-20 ENCOUNTER — Other Ambulatory Visit: Payer: Self-pay | Admitting: Internal Medicine

## 2016-07-20 NOTE — Telephone Encounter (Signed)
CY Please advise on refill. Thanks.  

## 2016-07-20 NOTE — Telephone Encounter (Signed)
Ok to Rx as before, with 2 refills as before

## 2016-09-13 ENCOUNTER — Encounter: Payer: Self-pay | Admitting: Internal Medicine

## 2016-09-13 ENCOUNTER — Ambulatory Visit (INDEPENDENT_AMBULATORY_CARE_PROVIDER_SITE_OTHER): Payer: Medicare Other | Admitting: Internal Medicine

## 2016-09-13 VITALS — BP 128/80 | HR 72 | Ht 64.5 in | Wt 198.0 lb

## 2016-09-13 DIAGNOSIS — J4530 Mild persistent asthma, uncomplicated: Secondary | ICD-10-CM

## 2016-09-13 DIAGNOSIS — J342 Deviated nasal septum: Secondary | ICD-10-CM | POA: Diagnosis not present

## 2016-09-13 DIAGNOSIS — R6889 Other general symptoms and signs: Secondary | ICD-10-CM | POA: Insufficient documentation

## 2016-09-13 DIAGNOSIS — G4733 Obstructive sleep apnea (adult) (pediatric): Secondary | ICD-10-CM | POA: Diagnosis not present

## 2016-09-13 DIAGNOSIS — H578 Other specified disorders of eye and adnexa: Secondary | ICD-10-CM

## 2016-09-13 MED ORDER — MONTELUKAST SODIUM 10 MG PO TABS: ORAL_TABLET | ORAL | 3 refills | Status: DC

## 2016-09-13 MED ORDER — ALBUTEROL SULFATE HFA 108 (90 BASE) MCG/ACT IN AERS: 2.0000 | INHALATION_SPRAY | Freq: Four times a day (QID) | RESPIRATORY_TRACT | 12 refills | Status: DC | PRN

## 2016-09-13 MED ORDER — FLUTICASONE-SALMETEROL 100-50 MCG/DOSE IN AEPB: INHALATION_SPRAY | RESPIRATORY_TRACT | 1 refills | Status: DC

## 2016-09-13 MED ORDER — AZELASTINE HCL 0.1 % NA SOLN: NASAL | 12 refills | Status: DC

## 2016-09-13 NOTE — Progress Notes (Addendum)
Patient ID: [name], female    DOB: August 01, 1951, 66 y.o.   MRN: [id number]  HPI  F never smoker folowed for asthma, allergic rhinitis, complicated by hx OSA/ oral appliance, HBP, GERD PFT 05/30/10- FEV1 2.56/ 91%, FEV1/FVC 0.73, DLCO 84%  within normal limits. NPSG-04/21/14- Moderate obstructive sleep apnea, AHI 21/ hr, CPAP to 14, weight 185 lbs  ------------------------------------------------------------------  02/24/2016-66 year old female never smoker followed for asthma, allergic rhinitis, OSA/oral appliance/Dr. Ron Parker, complicated by HBP, GERD LOV 11/25/15- given prednisone taper, cough syrup, Biaxin FOLLOWS FOR: Pt states she is having flare up of allergies at this time-itchy and watery eyes. Itchy throat at times as well.  recent itching in throat with watering eyes and nose. She questions mold becase of wet weather. Chest eel okay Using Zyrtec an occasional Benadryl, azelastine nasal spray ad Nasonex. CXR 11/25/2015 IMPRESSION: Mild interstitial prominence of both lungs which is nonspecific. This could reflect acute bronchitic change. There is no evidence of pulmonary vascular codngestion. Electronically Signed  By: David Martinique M.D.  On: 11/25/2015 11:25  09/13/2016-66 year old female never smoker followed for asthma, allergic rhinitis, OSA/oral appliance/Dr. Ron Parker, complicated by HBP, GERD FOLLOWS FOR: nose stays stopped up; itchy,watery eyes as well. Dr Ron Parker told her that her left nostril is 75% blocked-pt does not want to have surgery at this moment. Pt does not use CPAP or oral appliance at this time for OSA. Symptoms unable to wear/assess oral appliance because of nasal congestion. History remote septoplasty. Mouth stays dry. We discussed possibility her complaints of itching and watery eyes were from dryness rather than allergy. Asthma well-controlled with no sleep disturbance and rare need for rescue inhaler.  Review of Systems- see HPI Constitutional:   No-    weight loss, night sweats, fevers, chills, +fatigue, lassitude. HEENT:   +  headaches, difficulty swallowing, tooth/dental problems, sore throat,       No-  sneezing, itching, ear ache, +nasal congestion, post nasal drip,  CV:  No-   chest pain, orthopnea, PND, swelling in lower extremities, anasarca,  dizziness, palpitations Resp: No-   shortness of breath with exertion or at rest.              No-   productive cough,  No non-productive cough,  No- coughing up of blood.              No-   change in color of mucus.  No- wheezing.   Skin: No-   rash or lesions. GI:  +heartburn, indigestion, abdominal pain, nausea, vomiting, GU: . MS:  See HPI-  joint pain or swelling.   Neuro-     nothing unusual Psych:  No- change in mood or affect. No depression or anxiety.  No memory loss.  OBJ General- Alert, Oriented, Affect-appropriate, Distress- none acute. + Overweight Skin- rash-none, lesions- none, excoriation- none Lymphadenopathy- none Head- atraumatic            Eyes- Gross vision intact, PERRLA, conjunctivae clear secretions            Ears- Hearing, canals-normal            Nose- + actually looks more open on the left, mucus-none, no-polyps, erosion, perforation             Throat- Mallampati III , mucosa clear , drainage- none, tonsils- atrophic, own teeth Neck- flexible , trachea midline, no stridor , thyroid nl, carotid no bruit Chest - symmetrical excursion , unlabored  Heart/CV- RRR , no murmur , no gallop  , no rub, nl s1 s2                           - JVD- none , edema- none, stasis changes- none, varices- none           Lung-  wheeze-none, cough- none , dullness-none, rub- none           Chest wall-  Abd-  Br/ Gen/ Rectal- Not done, not indicated Extrem- cyanosis- none, clubbing, none, atrophy- none, strength- nl Neuro- grossly intact to observation

## 2016-09-13 NOTE — Assessment & Plan Note (Signed)
She says nasal stuffiness is preventing comfortable use of her oral appliance for sleep apnea and interfering with assessment of the device. Plan-refer to ENT to see if nasal airway can be improved.

## 2016-09-13 NOTE — Assessment & Plan Note (Signed)
Good control continuing Advair with Singulair but rare need for rescue inhaler.

## 2016-09-13 NOTE — Assessment & Plan Note (Signed)
She complains of dry eyes and dry mouth. I think dry eye syndrome at this time of year is more likely than allergic conjunctivitis. She has not been wearing her contact lenses because of this. Plan-I suggested OTC artificial tears. She can discuss with her eye doctor as well.

## 2016-09-13 NOTE — Patient Instructions (Signed)
Meds refilled  Suggest trying an otc artificial tear eye drop like Systane or Genteal for the itching/ burning to see if it is dry-eyes as opposed to allergy  Order- referral to [facility]     Dx septal deviation

## 2016-10-04 DIAGNOSIS — J343 Hypertrophy of nasal turbinates: Secondary | ICD-10-CM | POA: Insufficient documentation

## 2016-10-04 DIAGNOSIS — J342 Deviated nasal septum: Secondary | ICD-10-CM | POA: Insufficient documentation

## 2016-11-06 ENCOUNTER — Telehealth: Payer: Self-pay | Admitting: Internal Medicine

## 2016-11-06 MED ORDER — ALBUTEROL SULFATE HFA 108 (90 BASE) MCG/ACT IN AERS: 2.0000 | INHALATION_SPRAY | RESPIRATORY_TRACT | 12 refills | Status: DC | PRN

## 2016-11-06 NOTE — Telephone Encounter (Signed)
Per CY-okay to give Rx for Ventolin HFA #1 2 puffs every 4 hours prn with 12 refills. Rx sent and nothing more needed at this time.

## 2017-01-03 ENCOUNTER — Other Ambulatory Visit: Payer: Self-pay | Admitting: Internal Medicine

## 2017-01-03 NOTE — Telephone Encounter (Signed)
Ok to refill 

## 2017-01-03 NOTE — Telephone Encounter (Signed)
CY Please advise on refill. Thanks.  

## 2017-03-11 ENCOUNTER — Other Ambulatory Visit: Payer: Self-pay | Admitting: Obstetrics & Gynecology

## 2017-03-12 NOTE — Telephone Encounter (Signed)
Medication refill request: valtrex  Last AEX:  01/24/16 SM  Next AEX: 05/04/17 SM  Last MMG (if hormonal medication request): 02/18/16 BIRADS2:Benign  Refill authorized: 01/24/16 #45tabs / 12R. Today please advise.

## 2017-05-03 NOTE — Progress Notes (Signed)
66 y.o. G1P1 Married Caucasian F here for annual exam.  Doing well except for worsening right knee pain.  Has seen Dr. Maureen Ralphs and planning right knee replacement.  As a result, not exercising as much and having increased constipation issues.  Uses colace nightly as well as benefiber at least nightly.  These two are working well for her.    Has a hemorrhoid that is bothering her today.  Would like me to look at this.  Denies vaginal bleeding.    Still playing bridge.  Doing tournaments as well.    Patient's last menstrual period was 09/05/2003.          Sexually active: No.  The current method of family planning is post menopausal status.    Exercising: Yes.    weights Smoker:  no  Health Maintenance: Pap:  01/24/16 negative, HR HPV negative, 01/21/15 negative, HR HPV negative, 01/01/14 LSIL, HR HPV positive  History of abnormal Pap:  yes MMG:  02/18/16 BIRADS 2 benign  Colonoscopy:  03/11/14 repeat in 3 years- Dr. Collene Mares  BMD:   08/19/14 osteoporosis TDaP:  2009  Pneumonia vaccine(s):  09/09/09 Zostavax:   Would like new vaccination Hep C testing: 08/20/13 negative  Screening Labs: PCP, Hb today: PCP   reports that she has never smoked. She has never used smokeless tobacco. She reports that she drinks alcohol. She reports that she does not use drugs.  Past Medical History:  Diagnosis Date  . Acid reflux   . Arthritis   . Asthma    LOV  6/13  Dr Annamaria Boots EPIC/ clearance on chart  from 10/12  . Depression   . Dysrhythmia    PVC's with caffeine and anxiety  . Heart murmur    since childhood  . Hiatal hernia   . High cholesterol   . Hypertension    OV with clearance and note Dr Addison Lank 6/13 chart  . Osteopenia   . Osteoporosis   . Sleep apnea    no CPAP/ last study 9 yrs ago- "mild per patient"  . STD (sexually transmitted disease)    HSV2  . Stye 06/2009   eye    Past Surgical History:  Procedure Laterality Date  . bilateral hip replacement Bilateral 6/07, 9/07   right, then  left  . BREAST REDUCTION SURGERY    . CESAREAN SECTION    . NASAL SEPTUM SURGERY    . TOTAL KNEE ARTHROPLASTY  03/18/2012   Procedure: TOTAL KNEE ARTHROPLASTY;  Surgeon: Gearlean Alf, MD;  Location: [facility];  Service: Orthopedics;  Laterality: Left;    Current Outpatient Prescriptions  Medication Sig Dispense Refill  . albuterol (PROVENTIL HFA;VENTOLIN HFA) 108 (90 Base) MCG/ACT inhaler Inhale 2 puffs into the lungs every 4 (four) hours as needed for wheezing or shortness of breath. 1 Inhaler 12  . amLODipine (NORVASC) 5 MG tablet Take 5 mg by mouth daily before breakfast.     . atorvastatin (LIPITOR) 40 MG tablet Take 40 mg by mouth daily.    Marland Kitchen azelastine (ASTELIN) 0.1 % nasal spray USE 1-2 SPRAYS IN EACH NOSTRIL AT BEDTIME 30 mL 12  . CALCIUM PO Take 1,000 mg by mouth daily.    . cetirizine (ZYRTEC) 10 MG tablet Take 10 mg by mouth daily as needed. Allergies    . Cholecalciferol (VITAMIN D PO) Take 5,000 Int'l Units by mouth daily.    . cloNIDine (CATAPRES) 0.1 MG tablet Take 0.1 mg by mouth at bedtime.    Marland Kitchen  cyclobenzaprine (FLEXERIL) 10 MG tablet TAKE 1 TABLET BY MOUTH EVERY EVENING    . esomeprazole (NEXIUM) 40 MG packet Take 40 mg by mouth daily before breakfast.     . Eszopiclone 3 MG TABS Take 1.5 mg by mouth at bedtime. Take immediately before bedtime    . Fluticasone-Salmeterol (ADVAIR DISKUS) 100-50 MCG/DOSE AEPB INHALE 1 PUFF BY MOUTH TWICE DAILY. RINSE MOUTH AFTER USE 180 each 1  . magnesium gluconate (MAGONATE) 500 MG tablet Take 500 mg by mouth at bedtime as needed.    . methocarbamol (ROBAXIN) 500 MG tablet Take 1 tablet by mouth 3 (three) times daily as needed.  2  . mometasone (NASONEX) 50 MCG/ACT nasal spray USE 2 SPRAYS INTO EACH NOSTRIL EVERY DAY AS NEEDED FOR ALLERGIES 17 g 9  . montelukast (SINGULAIR) 10 MG tablet TAKE 1 TABLET (10 MG TOTAL) BY MOUTH AT BEDTIME 90 tablet 3  . Multiple Vitamin (MULTIVITAMIN) tablet Take 1 tablet by mouth daily. Contains calcium    .  Probiotic Product (PROBIOTIC DAILY PO) Take 1 tablet by mouth daily.    . valACYclovir (VALTREX) 500 MG tablet TAKE 1 TABLET BY MOUTH EVERY DAY INCREASE TO TWICE A DAY FOR 3 DAYS WITH SYMPTOMS. 45 tablet 1   No current facility-administered medications for this visit.     Family History  Problem Relation Age of Onset  . Heart disease Father   . Cancer Father   . Arthritis Mother   . Pancreatic cancer Unknown   . Coronary artery disease Other   . Cancer Other   . Asthma Other   . Hypertension Mother   . Hypertension Father   . Nephritis Daughter        glomerular  . Kidney Stones Father     ROS:  Pertinent items are noted in HPI.  Otherwise, a comprehensive ROS was negative.  Exam:   BP 112/72 (BP Location: Right Arm, Patient Position: Sitting, Cuff Size: Normal)   Pulse 68   Resp 16   Ht 5\' 4"  (1.626 m)   Wt 197 lb (89.4 kg)   LMP 09/05/2003   BMI 33.81 kg/m   Weight change: +1#   Height: 5\' 4"  (162.6 cm)  Ht Readings from Last 3 Encounters:  05/04/17 5\' 4"  (1.626 m)  09/13/16 5' 4.5" (1.638 m)  02/24/16 5' 4.5" (1.638 m)    General appearance: alert, cooperative and appears stated age Head: Normocephalic, without obvious abnormality, atraumatic Neck: no adenopathy, supple, symmetrical, trachea midline and thyroid normal to inspection and palpation Lungs: clear to auscultation bilaterally Breasts: normal appearance, no masses or tenderness Heart: regular rate and rhythm Abdomen: soft, non-tender; bowel sounds normal; no masses,  no organomegaly Extremities: extremities normal, atraumatic, no cyanosis or edema Skin: Skin color, texture, turgor normal. No rashes or lesions Lymph nodes: Cervical, supraclavicular, and axillary nodes normal. No abnormal inguinal nodes palpated Neurologic: Grossly normal   Pelvic: External genitalia:  no lesions              Urethra:  normal appearing urethra with no masses, tenderness or lesions              Bartholins and Skenes:  normal                 Vagina: normal appearing vagina with normal color and discharge, no lesions              Cervix: no lesions  Pap taken: No. Bimanual Exam:  Uterus:  normal size, contour, position, consistency, mobility, non-tender              Adnexa: normal adnexa and no mass, fullness, tenderness               Rectovaginal: Confirms               Anus:  normal sphincter tone, no lesions  Chaperone was present for exam.  A:  Well Woman with normal exam PMP, no HRT Osteoporosis, completed two years of Forteo.  Seeing Dr. Chalmers Cater.   Elevated lipids Hypertension Asthma H/O LGSIL with CIN 2015.  Neg pap and HR HPV 2016, 2017  P:   Mammogram guidelines reviewed.  MMG scheduled for pt today. pap smear not obtained today. Valtrex 500mg  daily and increase to bid x 3 days.  #45/12RF. Rx for Shingrix vaccine given today Return annually or prn

## 2017-05-04 ENCOUNTER — Ambulatory Visit (INDEPENDENT_AMBULATORY_CARE_PROVIDER_SITE_OTHER): Payer: Medicare Other | Admitting: Obstetrics & Gynecology

## 2017-05-04 ENCOUNTER — Encounter: Payer: Self-pay | Admitting: Obstetrics & Gynecology

## 2017-05-04 VITALS — BP 112/72 | HR 68 | Resp 16 | Ht 64.0 in | Wt 197.0 lb

## 2017-05-04 DIAGNOSIS — Z01419 Encounter for gynecological examination (general) (routine) without abnormal findings: Secondary | ICD-10-CM

## 2017-05-04 MED ORDER — VALACYCLOVIR HCL 500 MG PO TABS: ORAL_TABLET | ORAL | 13 refills | Status: DC

## 2017-05-04 NOTE — Progress Notes (Signed)
3D MMG scheduled for patient while she was in the office. Patient scheduled for MMG at [facility] on Wednesday 05/09/17 at [place]. Patient agreeable to date and time of appointment.

## 2017-05-07 ENCOUNTER — Other Ambulatory Visit: Payer: Self-pay | Admitting: Obstetrics & Gynecology

## 2017-06-16 ENCOUNTER — Other Ambulatory Visit: Payer: Self-pay | Admitting: Internal Medicine

## 2017-06-18 ENCOUNTER — Telehealth: Payer: Self-pay | Admitting: Internal Medicine

## 2017-06-18 MED ORDER — DOXYCYCLINE HYCLATE 100 MG PO TABS: ORAL_TABLET | ORAL | 0 refills | Status: DC

## 2017-06-18 NOTE — Telephone Encounter (Signed)
Spoke with female and advised pt to call back to discuss CY recommendations. Will await return call.

## 2017-06-18 NOTE — Telephone Encounter (Signed)
If she feels she needs an antibiotic, for more than just a mild cold, then offer doxycycline 100 mg, # 8, 2 today then one daily  Ok to use Mucinex, and stay well-hydrated

## 2017-06-18 NOTE — Telephone Encounter (Signed)
Called pt and advised message from the provider. Rx sent to the pharmacy. Pt understood and verbalized understanding. Nothing further is needed.

## 2017-06-18 NOTE — Telephone Encounter (Signed)
Pt c/o sinus congestion, pnd, prod cough with yellow mucus X1 day. Denies chest congestion, chest pain, fever.   Has taken Zinc and Advil to help with s/s- requesting additional recs.   Pt uses [facility]  Last ov: 09/13/16 Next ov:  07/03/17  CY please advise.  Thanks.

## 2017-07-01 ENCOUNTER — Ambulatory Visit: Payer: Self-pay | Admitting: Orthopedic Surgery

## 2017-07-03 ENCOUNTER — Ambulatory Visit (INDEPENDENT_AMBULATORY_CARE_PROVIDER_SITE_OTHER): Payer: Medicare Other | Admitting: Internal Medicine

## 2017-07-03 ENCOUNTER — Encounter: Payer: Self-pay | Admitting: Internal Medicine

## 2017-07-03 DIAGNOSIS — J302 Other seasonal allergic rhinitis: Secondary | ICD-10-CM

## 2017-07-03 DIAGNOSIS — J4530 Mild persistent asthma, uncomplicated: Secondary | ICD-10-CM

## 2017-07-03 DIAGNOSIS — G4733 Obstructive sleep apnea (adult) (pediatric): Secondary | ICD-10-CM

## 2017-07-03 DIAGNOSIS — J3089 Other allergic rhinitis: Secondary | ICD-10-CM

## 2017-07-03 MED ORDER — MONTELUKAST SODIUM 10 MG PO TABS
ORAL_TABLET | ORAL | 3 refills | Status: DC
Start: 2017-07-03 — End: 2017-12-01

## 2017-07-03 MED ORDER — AZELASTINE HCL 0.1 % NA SOLN: NASAL | 12 refills | Status: DC

## 2017-07-03 MED ORDER — MOMETASONE FUROATE 50 MCG/ACT NA SUSP: NASAL | 12 refills | Status: DC

## 2017-07-03 MED ORDER — AMOXICILLIN-POT CLAVULANATE 875-125 MG PO TABS: 1.0000 | ORAL_TABLET | Freq: Two times a day (BID) | ORAL | 1 refills | Status: DC

## 2017-07-03 MED ORDER — ALBUTEROL SULFATE HFA 108 (90 BASE) MCG/ACT IN AERS: 2.0000 | INHALATION_SPRAY | RESPIRATORY_TRACT | 12 refills | Status: DC | PRN

## 2017-07-03 MED ORDER — FLUTICASONE-SALMETEROL 100-50 MCG/DOSE IN AEPB: INHALATION_SPRAY | RESPIRATORY_TRACT | 12 refills | Status: DC

## 2017-07-03 NOTE — Patient Instructions (Signed)
Ok to go forward with planned surgery. If something changes please let me know.  Med refills sent as discussed  Please call if I can help

## 2017-07-03 NOTE — Assessment & Plan Note (Signed)
Good control. I don't expect this to be a problem with her pending surgery. Plan-we will refilled her meds with discussion.

## 2017-07-03 NOTE — Assessment & Plan Note (Signed)
Once she gets past knee surgery, I am likely to recommend updated home sleep test. She might be a candidate for nerve stimulator technology if she were interested.

## 2017-07-03 NOTE — Progress Notes (Signed)
Patient ID: [name], female    DOB: [birth year], 66 y.o.   MRN: [id number]  HPI  F never smoker folowed for asthma, allergic rhinitis, complicated by hx OSA/ oral appliance, HBP, GERD PFT 05/30/10- FEV1 2.56/ 91%, FEV1/FVC 0.73, DLCO 84%  within normal limits. NPSG-04/21/14- Moderate obstructive sleep apnea, AHI 21/ hr, CPAP to 14, weight 185 lbs  -----------------------------------------------------------------  09/13/2016-66 year old female never smoker followed for asthma, allergic rhinitis, OSA/oral appliance/Dr. Ron Parker, complicated by HBP, GERD FOLLOWS FOR: nose stays stopped up; itchy,watery eyes as well. Dr Ron Parker told her that her left nostril is 75% blocked-pt does not want to have surgery at this moment. Pt does not use CPAP or oral appliance at this time for OSA. Symptoms unable to wear/assess oral appliance because of nasal congestion. History remote septoplasty. Mouth stays dry. We discussed possibility her complaints of itching and watery eyes were from dryness rather than allergy. Asthma well-controlled with no sleep disturbance and rare need for rescue inhaler.  07/03/17- 66 year old female never smoker followed for asthma, allergic rhinitis, OSA/oral appliance/Dr. Ron Parker, complicated by HBP, GERD OSA-Pt was not able to tolerate CPAP or oral appliance. Pt is wanting to get something for sleep but wants to wait til after knee replacement.  Pt needs clearance for this,  Singulair, Advair 100-50, albuterol HFA Pending TKR right knee. Had previous bilateral hip and left TKR. Asthma control has been quite good continuing Advair 100 and Singulair. Rarely needs rescue inhaler. No acute events or concerns that would affect surgery plans. Denies chest pain or cough. Has incidental head cold today-discussed supportive care. Failed to tolerate either CPAP for oral appliance. Dr. Terence Lux has told her she may need ENT surgery for chronic obstruction with history of septal deviation. I  outlined pending availability of Inspire nerve stimulator for OSA.  She continues to use Lunesta when needed.  Review of Systems- see HPI  + = positive Constitutional:   No-   weight loss, night sweats, fevers, chills, +fatigue, lassitude. HEENT:   +  headaches, difficulty swallowing, tooth/dental problems, sore throat,       No-  sneezing, itching, ear ache, +nasal congestion, post nasal drip,  CV:  No-   chest pain, orthopnea, PND, swelling in lower extremities, anasarca,  dizziness, palpitations Resp: No-   shortness of breath with exertion or at rest.              No-   productive cough,  No non-productive cough,  No- coughing up of blood.              No-   change in color of mucus.  No- wheezing.   Skin: No-   rash or lesions. GI:  +heartburn, indigestion, abdominal pain, nausea, vomiting, GU: . MS:  See HPI-  joint pain or swelling.   Neuro-     nothing unusual Psych:  No- change in mood or affect. No depression or anxiety.  No memory loss.  OBJ General- Alert, Oriented, Affect-appropriate, Distress- none acute. + Overweight Skin- rash-none, lesions- none, excoriation- none Lymphadenopathy- none Head- atraumatic            Eyes- Gross vision intact, PERRLA, conjunctivae + lightly injected right with clear secretion            Ears- Hearing, canals-normal            Nose- + actually looks more open on the left, mucus+, no-polyps, erosion, perforation + blowing nose  Throat- Mallampati III , mucosa clear , drainage- none, tonsils- atrophic, own teeth Neck- flexible , trachea midline, no stridor , thyroid nl, carotid no bruit Chest - symmetrical excursion , unlabored           Heart/CV- RRR , no murmur , no gallop  , no rub, nl s1 s2                           - JVD- none , edema- none, stasis changes- none, varices- none           Lung-  wheeze-none, cough- none , dullness-none, rub- none           Chest wall-  Abd-  Br/ Gen/ Rectal- Not done, not indicated Extrem-  cyanosis- none, clubbing, none, atrophy- none, strength- nl Neuro- grossly intact to observation

## 2017-07-03 NOTE — Assessment & Plan Note (Signed)
Burning and right eye, nasal congestion and blowing clear mucus over the last 2 days-most consistent with viral URI syndrome which should be self-limited. She hasn't been outdoors much making allergic rhinitis less likely. Meds refilled.

## 2017-07-17 NOTE — Progress Notes (Signed)
07-03-17 (EPIC) Pulmonary clearance from Dr. Annamaria Boots  06-01-17 Surgical clearance on chart from Dr. Addison Lank

## 2017-07-17 NOTE — Patient Instructions (Signed)
[name]  07/17/2017   Your procedure is scheduled on: 07-23-17   Report to [facility] Main  Entrance Report to Admitting at 6:55 AM   Call this number if you have problems the morning of surgery  [phone]   Remember: ONLY 1 PERSON MAY GO WITH YOU TO SHORT STAY TO GET  READY MORNING OF YOUR SURGERY.  Do not eat food or drink liquids :After Midnight.     Take these medicines the morning of surgery with A SIP OF WATER: Amlodipine (Norvasc), Esomeprazole (Nexium). You may also bring and use your inhaler and nasal spray as needed.                                You may not have any metal on your body including hair pins and              piercings  Do not wear jewelry, make-up, lotions, powders or perfumes, deodorant             Do not wear nail polish.  Do not shave  48 hours prior to surgery.                 Do not bring valuables to the hospital. [place].  Contacts, dentures or bridgework may not be worn into surgery.  Leave suitcase in the car. After surgery it may be brought to your room.                 Please read over the following fact sheets you were given: _____________________________________________________________________             [facility] - Preparing for Surgery Before surgery, you can play an important role.  Because skin is not sterile, your skin needs to be as free of germs as possible.  You can reduce the number of germs on your skin by washing with CHG (chlorahexidine gluconate) soap before surgery.  CHG is an antiseptic cleaner which kills germs and bonds with the skin to continue killing germs even after washing. Please DO NOT use if you have an allergy to CHG or antibacterial soaps.  If your skin becomes reddened/irritated stop using the CHG and inform your nurse when you arrive at Short Stay. Do not shave (including legs and underarms) for at least 48 hours prior to  the first CHG shower.  You may shave your face/neck. Please follow these instructions carefully:  1.  Shower with CHG Soap the night before surgery and the  morning of Surgery.  2.  If you choose to wash your hair, wash your hair first as usual with your  normal  shampoo.  3.  After you shampoo, rinse your hair and body thoroughly to remove the  shampoo.                           4.  Use CHG as you would any other liquid soap.  You can apply chg directly  to the skin and wash                       Gently with a scrungie or clean washcloth.  5.  Apply the CHG Soap to your body ONLY FROM THE NECK DOWN.   Do not use on face/ open                           Wound or open sores. Avoid contact with eyes, ears mouth and genitals (private parts).                       Wash face,  Genitals (private parts) with your normal soap.             6.  Wash thoroughly, paying special attention to the area where your surgery  will be performed.  7.  Thoroughly rinse your body with warm water from the neck down.  8.  DO NOT shower/wash with your normal soap after using and rinsing off  the CHG Soap.                9.  Pat yourself dry with a clean towel.            10.  Wear clean pajamas.            11.  Place clean sheets on your bed the night of your first shower and do not  sleep with pets. Day of Surgery : Do not apply any lotions/deodorants the morning of surgery.  Please wear clean clothes to the hospital/surgery center.  FAILURE TO FOLLOW THESE INSTRUCTIONS MAY RESULT IN THE CANCELLATION OF YOUR SURGERY PATIENT SIGNATURE_________________________________  NURSE SIGNATURE__________________________________  ________________________________________________________________________   Adam Phenix  An incentive spirometer is a tool that can help keep your lungs clear and active. This tool measures how well you are filling your lungs with each breath. Taking long deep breaths may help reverse or  decrease the chance of developing breathing (pulmonary) problems (especially infection) following:  A long period of time when you are unable to move or be active. BEFORE THE PROCEDURE   If the spirometer includes an indicator to show your best effort, your nurse or respiratory therapist will set it to a desired goal.  If possible, sit up straight or lean slightly forward. Try not to slouch.  Hold the incentive spirometer in an upright position. INSTRUCTIONS FOR USE  1. Sit on the edge of your bed if possible, or sit up as far as you can in bed or on a chair. 2. Hold the incentive spirometer in an upright position. 3. Breathe out normally. 4. Place the mouthpiece in your mouth and seal your lips tightly around it. 5. Breathe in slowly and as deeply as possible, raising the piston or the ball toward the top of the column. 6. Hold your breath for 3-5 seconds or for as long as possible. Allow the piston or ball to fall to the bottom of the column. 7. Remove the mouthpiece from your mouth and breathe out normally. 8. Rest for a few seconds and repeat Steps 1 through 7 at least 10 times every 1-2 hours when you are awake. Take your time and take a few normal breaths between deep breaths. 9. The spirometer may include an indicator to show your best effort. Use the indicator as a goal to work toward during each repetition. 10. After each set of 10 deep breaths, practice coughing to be sure your lungs are clear. If you have an incision (the cut made at the time of surgery), support your incision when coughing by placing a pillow or  rolled up towels firmly against it. Once you are able to get out of bed, walk around indoors and cough well. You may stop using the incentive spirometer when instructed by your caregiver.  RISKS AND COMPLICATIONS  Take your time so you do not get dizzy or light-headed.  If you are in pain, you may need to take or ask for pain medication before doing incentive spirometry.  It is harder to take a deep breath if you are having pain. AFTER USE  Rest and breathe slowly and easily.  It can be helpful to keep track of a log of your progress. Your caregiver can provide you with a simple table to help with this. If you are using the spirometer at home, follow these instructions: [place] IF:   You are having difficultly using the spirometer.  You have trouble using the spirometer as often as instructed.  Your pain medication is not giving enough relief while using the spirometer.  You develop fever of 100.5 F (38.1 C) or higher. SEEK IMMEDIATE MEDICAL CARE IF:   You cough up bloody sputum that had not been present before.  You develop fever of 102 F (38.9 C) or greater.  You develop worsening pain at or near the incision site. MAKE SURE YOU:   Understand these instructions.  Will watch your condition.  Will get help right away if you are not doing well or get worse. Document Released: 01/01/2007 Document Revised: 11/13/2011 Document Reviewed: 03/04/2007 ExitCare Patient Information 2014 ExitCare, [place].   ________________________________________________________________________  WHAT IS A BLOOD TRANSFUSION? Blood Transfusion Information  A transfusion is the replacement of blood or some of its parts. Blood is made up of multiple cells which provide different functions.  Red blood cells carry oxygen and are used for blood loss replacement.  White blood cells fight against infection.  Platelets control bleeding.  Plasma helps clot blood.  Other blood products are available for specialized needs, such as hemophilia or other clotting disorders. BEFORE THE TRANSFUSION  Who gives blood for transfusions?   Healthy volunteers who are fully evaluated to make sure their blood is safe. This is blood bank blood. Transfusion therapy is the safest it has ever been in the practice of medicine. Before blood is taken from a donor, a complete  history is taken to make sure that person has no history of diseases nor engages in risky social behavior (examples are intravenous drug use or sexual activity with multiple partners). The donor's travel history is screened to minimize risk of transmitting infections, such as malaria. The donated blood is tested for signs of infectious diseases, such as HIV and hepatitis. The blood is then tested to be sure it is compatible with you in order to minimize the chance of a transfusion reaction. If you or a relative donates blood, this is often done in anticipation of surgery and is not appropriate for emergency situations. It takes many days to process the donated blood. RISKS AND COMPLICATIONS Although transfusion therapy is very safe and saves many lives, the main dangers of transfusion include:   Getting an infectious disease.  Developing a transfusion reaction. This is an allergic reaction to something in the blood you were given. Every precaution is taken to prevent this. The decision to have a blood transfusion has been considered carefully by your caregiver before blood is given. Blood is not given unless the benefits outweigh the risks. AFTER THE TRANSFUSION  Right after receiving a blood transfusion, you will usually  feel much better and more energetic. This is especially true if your red blood cells have gotten low (anemic). The transfusion raises the level of the red blood cells which carry oxygen, and this usually causes an energy increase.  The nurse administering the transfusion will monitor you carefully for complications. HOME CARE INSTRUCTIONS  No special instructions are needed after a transfusion. You may find your energy is better. Speak with your caregiver about any limitations on activity for underlying diseases you may have. SEEK MEDICAL CARE IF:   Your condition is not improving after your transfusion.  You develop redness or irritation at the intravenous (IV) site. SEEK  IMMEDIATE MEDICAL CARE IF:  Any of the following symptoms occur over the next 12 hours:  Shaking chills.  You have a temperature by mouth above 102 F (38.9 C), not controlled by medicine.  Chest, back, or muscle pain.  People around you feel you are not acting correctly or are confused.  Shortness of breath or difficulty breathing.  Dizziness and fainting.  You get a rash or develop hives.  You have a decrease in urine output.  Your urine turns a dark color or changes to pink, red, or brown. Any of the following symptoms occur over the next 10 days:  You have a temperature by mouth above 102 F (38.9 C), not controlled by medicine.  Shortness of breath.  Weakness after normal activity.  The white part of the eye turns yellow (jaundice).  You have a decrease in the amount of urine or are urinating less often.  Your urine turns a dark color or changes to pink, red, or brown. Document Released: 08/18/2000 Document Revised: 11/13/2011 Document Reviewed: 04/06/2008 [employer] Patient Information 2014 [place], [place].  _______________________________________________________________________

## 2017-07-18 ENCOUNTER — Encounter (HOSPITAL_COMMUNITY): Payer: Self-pay

## 2017-07-18 ENCOUNTER — Other Ambulatory Visit: Payer: Self-pay

## 2017-07-18 ENCOUNTER — Encounter (HOSPITAL_COMMUNITY)
Admission: RE | Admit: 2017-07-18 | Discharge: 2017-07-18 | Disposition: A | Discharge status: Home / Self Care | Payer: Medicare Other | Source: Ambulatory Visit | Attending: Orthopedic Surgery | Admitting: Orthopedic Surgery

## 2017-07-18 DIAGNOSIS — Z79899 Other long term (current) drug therapy: Secondary | ICD-10-CM | POA: Diagnosis not present

## 2017-07-18 DIAGNOSIS — Z7901 Long term (current) use of anticoagulants: Secondary | ICD-10-CM | POA: Diagnosis not present

## 2017-07-18 DIAGNOSIS — K219 Gastro-esophageal reflux disease without esophagitis: Secondary | ICD-10-CM | POA: Insufficient documentation

## 2017-07-18 DIAGNOSIS — G4733 Obstructive sleep apnea (adult) (pediatric): Secondary | ICD-10-CM | POA: Insufficient documentation

## 2017-07-18 DIAGNOSIS — Z01818 Encounter for other preprocedural examination: Secondary | ICD-10-CM | POA: Diagnosis present

## 2017-07-18 DIAGNOSIS — M1711 Unilateral primary osteoarthritis, right knee: Secondary | ICD-10-CM | POA: Insufficient documentation

## 2017-07-18 DIAGNOSIS — R9431 Abnormal electrocardiogram [ECG] [EKG]: Secondary | ICD-10-CM | POA: Diagnosis not present

## 2017-07-18 DIAGNOSIS — I10 Essential (primary) hypertension: Secondary | ICD-10-CM | POA: Diagnosis not present

## 2017-07-18 LAB — COMPREHENSIVE METABOLIC PANEL
ALT: 23 U/L (ref 14–54)
ANION GAP: 10 (ref 5–15)
AST: 37 U/L (ref 15–41)
Albumin: 4.1 g/dL (ref 3.5–5.0)
Alkaline Phosphatase: 135 U/L — ABNORMAL HIGH (ref 38–126)
BILIRUBIN TOTAL: 0.7 mg/dL (ref 0.3–1.2)
BUN: 11 mg/dL (ref 6–20)
CALCIUM: 9.4 mg/dL (ref 8.9–10.3)
CO2: 26 mmol/L (ref 22–32)
Chloride: 101 mmol/L (ref 101–111)
Creatinine, Ser: 0.94 mg/dL (ref 0.44–1.00)
GFR calc Af Amer: >60 mL/min (ref >60)
GLUCOSE: 134 mg/dL — AB (ref 65–99)
POTASSIUM: 3.9 mmol/L (ref 3.5–5.1)
SODIUM: 137 mmol/L (ref 135–145)
TOTAL PROTEIN: 7.3 g/dL (ref 6.5–8.1)

## 2017-07-18 LAB — APTT: aPTT: 30 seconds (ref 24–36)

## 2017-07-18 LAB — SURGICAL PCR SCREEN
MRSA, PCR: NEGATIVE
STAPHYLOCOCCUS AUREUS: NEGATIVE

## 2017-07-18 LAB — CBC
HEMATOCRIT: 40.5 % (ref 36.0–46.0)
HEMOGLOBIN: 12.5 g/dL (ref 12.0–15.0)
MCH: 23.4 pg — AB (ref 26.0–34.0)
MCHC: 30.9 g/dL (ref 30.0–36.0)
MCV: 75.7 fL — AB (ref 78.0–100.0)
Platelets: 296 10*3/uL (ref 150–400)
RBC: 5.35 MIL/uL — AB (ref 3.87–5.11)
RDW: 16.5 % — ABNORMAL HIGH (ref 11.5–15.5)
WBC: 5.1 10*3/uL (ref 4.0–10.5)

## 2017-07-18 LAB — PROTIME-INR
INR: 0.95
PROTHROMBIN TIME: 12.6 s (ref 11.4–15.2)

## 2017-07-18 NOTE — Progress Notes (Signed)
Pt had a red rash on LLQ/RLQ at PAT appointment. Pt stated  it is a chronic dermatologic condition that flares up, but resolves on its on.

## 2017-07-21 ENCOUNTER — Ambulatory Visit: Payer: Self-pay | Admitting: Orthopedic Surgery

## 2017-07-21 NOTE — H&P (View-Only) (Signed)
[name] DOB: February 09, 1951 Married / Language: Cleophus Molt / Race: White Female Date of admission: July 23, 2017  Chief complaint: Right knee pain History of Present Illness The patient is a 66 year old female who comes in  for a preoperative History and Physical. The patient is scheduled for a right total knee arthroplasty to be performed by Dr. Dione Plover. Aluisio, MD at [facility] on 07-23-2017. The patient is a 66 year old female who presented for follow up of their knee. The patient is being followed for their right knee pain and osteoarthritis. They are now month(s) out from cortisone injection. Symptoms reported include: pain, swelling, instability and difficulty ambulating. The patient feels that they are doing poorly and report their pain level to be mild to moderate. The patient has reported temporary improvement of their symptoms with: Cortisone injections. She is at a stage where the knee is continuing to cause significant problems for her. The injections helped for a short amount of time. She is ready to get the knee replaced at this time. They have been treated conservatively in the past for the above stated problem and despite conservative measures, they continue to have progressive pain and severe functional limitations and dysfunction. They have failed non-operative management including home exercise, medications, and injections. It is felt that they would benefit from undergoing total joint replacement. Risks and benefits of the procedure have been discussed with the patient and they elect to proceed with surgery. There are no active contraindications to surgery such as ongoing infection or rapidly progressive neurological disease.    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)  Aftercare following left knee joint replacement surgery (Z47.1)  Primary osteoarthritis of right knee (M17.11)   Osteoarthritis  Have PVC's occasionally -- formerly took English as a second language teacher -- now unmedicated Sleep Apnea  Hypercholesterolemia  Asthma  Gastroesophageal Reflux Disease  High blood pressure  Menopause  Herpes   Allergies Demerol *ANALGESICS - OPIOID*  Nadolol *BETA BLOCKERS*  Meloxicam *ANALGESICS - ANTI-INFLAMMATORY*  possible bleeding ulcer  Family History  Kidney disease  child Hypertension  father Severe allergy  mother and sister Osteoarthritis  mother and sister Congestive Heart Failure  mother Cancer  father and grandmother fathers side Heart disease in female family member before age 65  Heart Disease  father  Social History Pain Contract  no Number of flights of stairs before winded  2-3 Tobacco use  never smoker Most recent primary occupation  former Pharmacist, hospital Marital status  married Tobacco / smoke exposure  no Previously in rehab  no Current work status  retired Children  1 Alcohol use  current drinker; drinks wine and hard liquor; only occasionally per week Drug/Alcohol Rehab (Currently)  no Living situation  live with spouse Illicit drug use  no Exercise  Exercises daily; does running / walking, other and gym / weights  Medication History  Proventil Active. Norvasc (10MG  Tablet, Oral) Active. Lipitor Active. Astelin Nasal Spray Active. Zyrtexc Active. Vitamin D (1 Oral) Specific strength unknown - Active. CloNIDine HCl (0.1MG  Tablet, Oral) Active. Cyclobenzaprine HCl (10MG  Tablet, 1 (one) Oral q pm, Taken starting 06/27/2017) Active. Doxycycline Active. NexIUM (40MG  Capsule DR, Oral) Active. Eszopiclone (1MG  Tablet, Oral) Active. Advair Diskus (100-50MCG/DOSE Aero Pow Br Act, Inhalation) Active. Magnesium Chloride (Oral) Specific strength unknown - Active. robaxin Active. Nasonex Active. Singular Active. Multiple Vitamin (1 Oral) Active. Probiotic Active. Valtrex (1GM Tablet, Oral) Active. Lunesta  (3MG  Tablet, Oral) Active.  Past Surgical History Mammoplasty; Reduction  bilateral Total Hip Replacement  bilateral Sinus Surgery  Cesarean Delivery  1 time Colon Polyp Removal - Colonoscopy  Foot Surgery  right   Review of Systems  General Not Present- Chills, Fatigue, Fever, Memory Loss, Night Sweats, Weight Gain and Weight Loss. Skin Not Present- Eczema, Hives, Itching, Lesions and Rash. HEENT Not Present- Dentures, Double Vision, Headache, Hearing Loss, Tinnitus and Visual Loss. Respiratory Not Present- Allergies, Chronic Cough, Coughing up blood, Shortness of breath at rest and Shortness of breath with exertion. Cardiovascular Not Present- Chest Pain, Difficulty Breathing Lying Down, Murmur, Palpitations, Racing/skipping heartbeats and Swelling. Gastrointestinal Not Present- Abdominal Pain, Bloody Stool, Constipation, Diarrhea, Difficulty Swallowing, Heartburn, Jaundice, Loss of appetitie, Nausea and Vomiting. Female Genitourinary Not Present- Blood in Urine, Discharge, Flank Pain, Incontinence, Painful Urination, Urgency, Urinary frequency, Urinary Retention, Urinating at Night and Weak urinary stream. Musculoskeletal Present- Joint Pain. Not Present- Back Pain, Joint Swelling, Morning Stiffness, Muscle Pain, Muscle Weakness and Spasms. Neurological Not Present- Blackout spells, Difficulty with balance, Dizziness, Paralysis, Tremor and Weakness. Psychiatric Not Present- Insomnia.  Vitals Height: 64.5in Height was reported by patient. Pulse: 68 (Regular)  BP: 138/68 (Sitting, Left Arm, Standard)    Physical Exam  General Mental Status -Alert, cooperative and good historian. General Appearance-pleasant, Not in acute distress. Orientation-Oriented X3. Build & Nutrition-Well nourished and Well developed.  Head and Neck Head-normocephalic, atraumatic . Neck Global Assessment - supple, no bruit auscultated on the right, no bruit auscultated on the  left.  Eye Vision-Wears corrective lenses. Pupil - Bilateral-Regular and Round. Motion - Bilateral-EOMI.  Chest and Lung Exam Auscultation Breath sounds - clear at anterior chest wall and clear at posterior chest wall. Adventitious sounds - No Adventitious sounds.  Cardiovascular Auscultation Rhythm - Regular rate and rhythm. Heart Sounds - S1 WNL and S2 WNL. Murmurs & Other Heart Sounds - Auscultation of the heart reveals - No Murmurs.  Abdomen Palpation/Percussion Tenderness - Abdomen is non-tender to palpation. Rigidity (guarding) - Abdomen is soft. Auscultation Auscultation of the abdomen reveals - Bowel sounds normal.  Female Genitourinary Note: Not done, not pertinent to present illness   Musculoskeletal Note: Her RIGHT knee shows no effusion. She has a varus deformity. Range of motion 5-120. There is marked crepitus on range of motion with tenderness medial greater than lateral and no instability noted.   Assessment & Plan Primary osteoarthritis of right knee (M17.11) Aftercare following left knee joint replacement surgery (Z47.1, [id number])  Note:Surgical Plans: Right Total Knee Replacement  Disposition: Home with help, Straight to outpatient therpay  PCP: Dr. Cari Caraway - Patient has been seen preoperatively and felt to be stable for surgery. Dr. Annamaria Boots - Patient has been seen preoperatively and felt to be stable for surgery.  IV TXA  Anesthesia Issues: None  Patient was instructed on what medications to stop prior to surgery.  Signed electronically by Joelene Millin, III PA-C

## 2017-07-21 NOTE — H&P (Signed)
[name] DOB: August 04, 1951 Married / Language: Cleophus Molt / Race: White Female Date of admission: July 23, 2017  Chief complaint: Right knee pain History of Present Illness The patient is a 66 year old female who comes in  for a preoperative History and Physical. The patient is scheduled for a right total knee arthroplasty to be performed by Dr. Dione Plover. Aluisio, MD at [facility] on 07-23-2017. The patient is a 66 year old female who presented for follow up of their knee. The patient is being followed for their right knee pain and osteoarthritis. They are now month(s) out from cortisone injection. Symptoms reported include: pain, swelling, instability and difficulty ambulating. The patient feels that they are doing poorly and report their pain level to be mild to moderate. The patient has reported temporary improvement of their symptoms with: Cortisone injections. She is at a stage where the knee is continuing to cause significant problems for her. The injections helped for a short amount of time. She is ready to get the knee replaced at this time. They have been treated conservatively in the past for the above stated problem and despite conservative measures, they continue to have progressive pain and severe functional limitations and dysfunction. They have failed non-operative management including home exercise, medications, and injections. It is felt that they would benefit from undergoing total joint replacement. Risks and benefits of the procedure have been discussed with the patient and they elect to proceed with surgery. There are no active contraindications to surgery such as ongoing infection or rapidly progressive neurological disease.    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)  Aftercare following left knee joint replacement surgery (Z47.1)  Primary osteoarthritis of right knee (M17.11)   Osteoarthritis  Have PVC's occasionally -- formerly took English as a second language teacher -- now unmedicated Sleep Apnea  Hypercholesterolemia  Asthma  Gastroesophageal Reflux Disease  High blood pressure  Menopause  Herpes   Allergies Demerol *ANALGESICS - OPIOID*  Nadolol *BETA BLOCKERS*  Meloxicam *ANALGESICS - ANTI-INFLAMMATORY*  possible bleeding ulcer  Family History  Kidney disease  child Hypertension  father Severe allergy  mother and sister Osteoarthritis  mother and sister Congestive Heart Failure  mother Cancer  father and grandmother fathers side Heart disease in female family member before age 71  Heart Disease  father  Social History Pain Contract  no Number of flights of stairs before winded  2-3 Tobacco use  never smoker Most recent primary occupation  former Pharmacist, hospital Marital status  married Tobacco / smoke exposure  no Previously in rehab  no Current work status  retired Children  1 Alcohol use  current drinker; drinks wine and hard liquor; only occasionally per week Drug/Alcohol Rehab (Currently)  no Living situation  live with spouse Illicit drug use  no Exercise  Exercises daily; does running / walking, other and gym / weights  Medication History  Proventil Active. Norvasc (10MG  Tablet, Oral) Active. Lipitor Active. Astelin Nasal Spray Active. Zyrtexc Active. Vitamin D (1 Oral) Specific strength unknown - Active. CloNIDine HCl (0.1MG  Tablet, Oral) Active. Cyclobenzaprine HCl (10MG  Tablet, 1 (one) Oral q pm, Taken starting 06/27/2017) Active. Doxycycline Active. NexIUM (40MG  Capsule DR, Oral) Active. Eszopiclone (1MG  Tablet, Oral) Active. Advair Diskus (100-50MCG/DOSE Aero Pow Br Act, Inhalation) Active. Magnesium Chloride (Oral) Specific strength unknown - Active. robaxin Active. Nasonex Active. Singular Active. Multiple Vitamin (1 Oral) Active. Probiotic Active. Valtrex (1GM Tablet, Oral) Active. Lunesta  (3MG  Tablet, Oral) Active.  Past Surgical History Mammoplasty; Reduction  bilateral Total Hip Replacement  bilateral Sinus Surgery  Cesarean Delivery  1 time Colon Polyp Removal - Colonoscopy  Foot Surgery  right   Review of Systems  General Not Present- Chills, Fatigue, Fever, Memory Loss, Night Sweats, Weight Gain and Weight Loss. Skin Not Present- Eczema, Hives, Itching, Lesions and Rash. HEENT Not Present- Dentures, Double Vision, Headache, Hearing Loss, Tinnitus and Visual Loss. Respiratory Not Present- Allergies, Chronic Cough, Coughing up blood, Shortness of breath at rest and Shortness of breath with exertion. Cardiovascular Not Present- Chest Pain, Difficulty Breathing Lying Down, Murmur, Palpitations, Racing/skipping heartbeats and Swelling. Gastrointestinal Not Present- Abdominal Pain, Bloody Stool, Constipation, Diarrhea, Difficulty Swallowing, Heartburn, Jaundice, Loss of appetitie, Nausea and Vomiting. Female Genitourinary Not Present- Blood in Urine, Discharge, Flank Pain, Incontinence, Painful Urination, Urgency, Urinary frequency, Urinary Retention, Urinating at Night and Weak urinary stream. Musculoskeletal Present- Joint Pain. Not Present- Back Pain, Joint Swelling, Morning Stiffness, Muscle Pain, Muscle Weakness and Spasms. Neurological Not Present- Blackout spells, Difficulty with balance, Dizziness, Paralysis, Tremor and Weakness. Psychiatric Not Present- Insomnia.  Vitals Height: 64.5in Height was reported by patient. Pulse: 68 (Regular)  BP: 138/68 (Sitting, Left Arm, Standard)    Physical Exam  General Mental Status -Alert, cooperative and good historian. General Appearance-pleasant, Not in acute distress. Orientation-Oriented X3. Build & Nutrition-Well nourished and Well developed.  Head and Neck Head-normocephalic, atraumatic . Neck Global Assessment - supple, no bruit auscultated on the right, no bruit auscultated on the  left.  Eye Vision-Wears corrective lenses. Pupil - Bilateral-Regular and Round. Motion - Bilateral-EOMI.  Chest and Lung Exam Auscultation Breath sounds - clear at anterior chest wall and clear at posterior chest wall. Adventitious sounds - No Adventitious sounds.  Cardiovascular Auscultation Rhythm - Regular rate and rhythm. Heart Sounds - S1 WNL and S2 WNL. Murmurs & Other Heart Sounds - Auscultation of the heart reveals - No Murmurs.  Abdomen Palpation/Percussion Tenderness - Abdomen is non-tender to palpation. Rigidity (guarding) - Abdomen is soft. Auscultation Auscultation of the abdomen reveals - Bowel sounds normal.  Female Genitourinary Note: Not done, not pertinent to present illness   Musculoskeletal Note: Her RIGHT knee shows no effusion. She has a varus deformity. Range of motion 5-120. There is marked crepitus on range of motion with tenderness medial greater than lateral and no instability noted.   Assessment & Plan Primary osteoarthritis of right knee (M17.11) Aftercare following left knee joint replacement surgery (Z47.1, [id number])  Note:Surgical Plans: Right Total Knee Replacement  Disposition: Home with help, Straight to outpatient therpay  PCP: Dr. Cari Caraway - Patient has been seen preoperatively and felt to be stable for surgery. Dr. Annamaria Boots - Patient has been seen preoperatively and felt to be stable for surgery.  IV TXA  Anesthesia Issues: None  Patient was instructed on what medications to stop prior to surgery.  Signed electronically by Joelene Millin, III PA-C

## 2017-07-23 ENCOUNTER — Encounter (HOSPITAL_COMMUNITY): Payer: Self-pay | Admitting: *Deleted

## 2017-07-23 ENCOUNTER — Inpatient Hospital Stay (HOSPITAL_COMMUNITY): Payer: Medicare Other | Admitting: Registered Nurse

## 2017-07-23 ENCOUNTER — Other Ambulatory Visit: Payer: Self-pay

## 2017-07-23 ENCOUNTER — Encounter (HOSPITAL_COMMUNITY)
Admission: RE | Disposition: A | Discharge status: Home / Self Care | Payer: Self-pay | Source: Ambulatory Visit | Attending: Orthopedic Surgery

## 2017-07-23 ENCOUNTER — Observation Stay (HOSPITAL_COMMUNITY)
Admission: RE | Admit: 2017-07-23 | Discharge: 2017-07-24 | Disposition: A | Discharge status: Home / Self Care | Payer: Medicare Other | Source: Ambulatory Visit | Attending: Orthopedic Surgery | Admitting: Orthopedic Surgery

## 2017-07-23 DIAGNOSIS — Z96652 Presence of left artificial knee joint: Secondary | ICD-10-CM | POA: Insufficient documentation

## 2017-07-23 DIAGNOSIS — M1711 Unilateral primary osteoarthritis, right knee: Secondary | ICD-10-CM | POA: Diagnosis present

## 2017-07-23 DIAGNOSIS — Z79899 Other long term (current) drug therapy: Secondary | ICD-10-CM | POA: Diagnosis not present

## 2017-07-23 DIAGNOSIS — I10 Essential (primary) hypertension: Secondary | ICD-10-CM | POA: Diagnosis not present

## 2017-07-23 DIAGNOSIS — Z7951 Long term (current) use of inhaled steroids: Secondary | ICD-10-CM | POA: Diagnosis not present

## 2017-07-23 DIAGNOSIS — M179 Osteoarthritis of knee, unspecified: Secondary | ICD-10-CM | POA: Diagnosis present

## 2017-07-23 DIAGNOSIS — J45909 Unspecified asthma, uncomplicated: Secondary | ICD-10-CM | POA: Insufficient documentation

## 2017-07-23 DIAGNOSIS — E7800 Pure hypercholesterolemia, unspecified: Secondary | ICD-10-CM | POA: Insufficient documentation

## 2017-07-23 DIAGNOSIS — M1710 Unilateral primary osteoarthritis, unspecified knee: Secondary | ICD-10-CM | POA: Diagnosis present

## 2017-07-23 HISTORY — PX: TOTAL KNEE ARTHROPLASTY: SHX125

## 2017-07-23 LAB — TYPE AND SCREEN
ABO/RH(D): O POS
Antibody Screen: NEGATIVE

## 2017-07-23 SURGERY — ARTHROPLASTY, KNEE, TOTAL
Anesthesia: Spinal | Site: Knee | Laterality: Right

## 2017-07-23 MED ORDER — ACETAMINOPHEN 650 MG RE SUPP: 650.0000 mg | RECTAL | Status: DC | PRN

## 2017-07-23 MED ORDER — CEFAZOLIN SODIUM-DEXTROSE 2-4 GM/100ML-% IV SOLN
2.0000 g | Freq: Four times a day (QID) | INTRAVENOUS | Status: AC
  Administered 2017-07-23 (×2): 2 g via INTRAVENOUS
  Filled 2017-07-23: qty 100

## 2017-07-23 MED ORDER — PROPOFOL 10 MG/ML IV BOLUS
INTRAVENOUS | Status: AC
  Filled 2017-07-23: qty 40

## 2017-07-23 MED ORDER — DEXAMETHASONE SODIUM PHOSPHATE 10 MG/ML IJ SOLN
INTRAMUSCULAR | Status: AC
  Filled 2017-07-23: qty 1

## 2017-07-23 MED ORDER — GABAPENTIN 300 MG PO CAPS
300.0000 mg | ORAL_CAPSULE | Freq: Once | ORAL | Status: AC
  Administered 2017-07-23: 300 mg via ORAL

## 2017-07-23 MED ORDER — METOCLOPRAMIDE HCL 5 MG/ML IJ SOLN: 5.0000 mg | Freq: Three times a day (TID) | INTRAMUSCULAR | Status: DC | PRN

## 2017-07-23 MED ORDER — LACTATED RINGERS IV SOLN
INTRAVENOUS | Status: DC
  Administered 2017-07-23: 08:00:00 via INTRAVENOUS

## 2017-07-23 MED ORDER — METHOCARBAMOL 500 MG PO TABS
500.0000 mg | ORAL_TABLET | Freq: Four times a day (QID) | ORAL | Status: DC | PRN
  Administered 2017-07-23 – 2017-07-24 (×3): 500 mg via ORAL
  Filled 2017-07-23 (×3): qty 1

## 2017-07-23 MED ORDER — MAGNESIUM GLUCONATE 500 MG PO TABS
500.0000 mg | ORAL_TABLET | Freq: Every day | ORAL | Status: DC
  Administered 2017-07-23: 500 mg via ORAL
  Filled 2017-07-23 (×2): qty 1

## 2017-07-23 MED ORDER — CEFAZOLIN SODIUM-DEXTROSE 2-4 GM/100ML-% IV SOLN
INTRAVENOUS | Status: AC
  Filled 2017-07-23: qty 100

## 2017-07-23 MED ORDER — FENTANYL CITRATE (PF) 100 MCG/2ML IJ SOLN: 50.0000 ug | INTRAMUSCULAR | Status: DC | PRN

## 2017-07-23 MED ORDER — DOCUSATE SODIUM 100 MG PO CAPS
100.0000 mg | ORAL_CAPSULE | Freq: Two times a day (BID) | ORAL | Status: DC
  Administered 2017-07-23 – 2017-07-24 (×2): 100 mg via ORAL
  Filled 2017-07-23 (×2): qty 1

## 2017-07-23 MED ORDER — PHENOL 1.4 % MT LIQD: 1.0000 | OROMUCOSAL | Status: DC | PRN

## 2017-07-23 MED ORDER — MORPHINE SULFATE (PF) 4 MG/ML IV SOLN: 1.0000 mg | INTRAVENOUS | Status: DC | PRN

## 2017-07-23 MED ORDER — SODIUM CHLORIDE 0.9 % IJ SOLN
INTRAMUSCULAR | Status: DC | PRN
  Administered 2017-07-23: 60 mL

## 2017-07-23 MED ORDER — ATORVASTATIN CALCIUM 40 MG PO TABS
40.0000 mg | ORAL_TABLET | Freq: Every day | ORAL | Status: DC
  Administered 2017-07-24: 40 mg via ORAL
  Filled 2017-07-23: qty 1

## 2017-07-23 MED ORDER — LORATADINE 10 MG PO TABS: 10.0000 mg | ORAL_TABLET | Freq: Every day | ORAL | Status: DC | PRN

## 2017-07-23 MED ORDER — ACETAMINOPHEN 10 MG/ML IV SOLN
1000.0000 mg | Freq: Once | INTRAVENOUS | Status: AC
  Administered 2017-07-23: 1000 mg via INTRAVENOUS

## 2017-07-23 MED ORDER — MIDAZOLAM HCL 2 MG/2ML IJ SOLN
2.0000 mg | Freq: Once | INTRAMUSCULAR | Status: AC
  Administered 2017-07-23: 2 mg via INTRAVENOUS

## 2017-07-23 MED ORDER — ZOLPIDEM TARTRATE 5 MG PO TABS
5.0000 mg | ORAL_TABLET | Freq: Every evening | ORAL | Status: DC | PRN
  Administered 2017-07-23: 5 mg via ORAL
  Filled 2017-07-23: qty 1

## 2017-07-23 MED ORDER — ACETAMINOPHEN 325 MG PO TABS: 650.0000 mg | ORAL_TABLET | ORAL | Status: DC | PRN

## 2017-07-23 MED ORDER — CEFAZOLIN SODIUM-DEXTROSE 2-4 GM/100ML-% IV SOLN
2.0000 g | INTRAVENOUS | Status: AC
  Administered 2017-07-23: 2 g via INTRAVENOUS

## 2017-07-23 MED ORDER — FENTANYL CITRATE (PF) 100 MCG/2ML IJ SOLN
100.0000 ug | Freq: Once | INTRAMUSCULAR | Status: AC
  Administered 2017-07-23 (×2): 50 ug via INTRAVENOUS

## 2017-07-23 MED ORDER — STERILE WATER FOR IRRIGATION IR SOLN
Status: DC | PRN
  Administered 2017-07-23: 2000 mL

## 2017-07-23 MED ORDER — ONDANSETRON HCL 4 MG/2ML IJ SOLN
4.0000 mg | Freq: Four times a day (QID) | INTRAMUSCULAR | Status: DC | PRN
  Administered 2017-07-24: 4 mg via INTRAVENOUS
  Filled 2017-07-23: qty 2

## 2017-07-23 MED ORDER — GABAPENTIN 300 MG PO CAPS
ORAL_CAPSULE | ORAL | Status: AC
  Administered 2017-07-23: 300 mg via ORAL
  Filled 2017-07-23: qty 1

## 2017-07-23 MED ORDER — MENTHOL 3 MG MT LOZG: 1.0000 | LOZENGE | OROMUCOSAL | Status: DC | PRN

## 2017-07-23 MED ORDER — ONDANSETRON HCL 4 MG PO TABS
4.0000 mg | ORAL_TABLET | Freq: Four times a day (QID) | ORAL | Status: DC | PRN
  Administered 2017-07-23: 4 mg via ORAL
  Filled 2017-07-23: qty 1

## 2017-07-23 MED ORDER — POLYETHYLENE GLYCOL 3350 17 G PO PACK: 17.0000 g | PACK | Freq: Every day | ORAL | Status: DC | PRN

## 2017-07-23 MED ORDER — OXYCODONE HCL 5 MG PO TABS
5.0000 mg | ORAL_TABLET | ORAL | Status: DC | PRN
  Administered 2017-07-23 (×2): 5 mg via ORAL
  Filled 2017-07-23 (×2): qty 1

## 2017-07-23 MED ORDER — BISACODYL 10 MG RE SUPP: 10.0000 mg | Freq: Every day | RECTAL | Status: DC | PRN

## 2017-07-23 MED ORDER — PANTOPRAZOLE SODIUM 40 MG PO TBEC
80.0000 mg | DELAYED_RELEASE_TABLET | Freq: Every day | ORAL | Status: DC
  Administered 2017-07-24: 80 mg via ORAL
  Filled 2017-07-23: qty 2

## 2017-07-23 MED ORDER — PHENYLEPHRINE 40 MCG/ML (10ML) SYRINGE FOR IV PUSH (FOR BLOOD PRESSURE SUPPORT)
PREFILLED_SYRINGE | INTRAVENOUS | Status: AC
  Filled 2017-07-23: qty 10

## 2017-07-23 MED ORDER — DEXAMETHASONE SODIUM PHOSPHATE 10 MG/ML IJ SOLN
10.0000 mg | Freq: Once | INTRAMUSCULAR | Status: AC
  Administered 2017-07-24: 10 mg via INTRAVENOUS
  Filled 2017-07-23: qty 1

## 2017-07-23 MED ORDER — FENTANYL CITRATE (PF) 100 MCG/2ML IJ SOLN
INTRAMUSCULAR | Status: AC
  Administered 2017-07-23: 50 ug via INTRAVENOUS
  Filled 2017-07-23: qty 2

## 2017-07-23 MED ORDER — ACETAMINOPHEN 10 MG/ML IV SOLN
INTRAVENOUS | Status: AC
  Filled 2017-07-23: qty 100

## 2017-07-23 MED ORDER — METOCLOPRAMIDE HCL 5 MG PO TABS: 5.0000 mg | ORAL_TABLET | Freq: Three times a day (TID) | ORAL | Status: DC | PRN

## 2017-07-23 MED ORDER — ONDANSETRON HCL 4 MG/2ML IJ SOLN
INTRAMUSCULAR | Status: AC
  Filled 2017-07-23: qty 2

## 2017-07-23 MED ORDER — CHLORHEXIDINE GLUCONATE 4 % EX LIQD: 60.0000 mL | Freq: Once | CUTANEOUS | Status: DC

## 2017-07-23 MED ORDER — SODIUM CHLORIDE 0.9 % IR SOLN: Status: DC | PRN

## 2017-07-23 MED ORDER — ONDANSETRON HCL 4 MG/2ML IJ SOLN
INTRAMUSCULAR | Status: DC | PRN
  Administered 2017-07-23: 4 mg via INTRAVENOUS

## 2017-07-23 MED ORDER — ACETAMINOPHEN 500 MG PO TABS
1000.0000 mg | ORAL_TABLET | Freq: Four times a day (QID) | ORAL | Status: AC
  Administered 2017-07-23 – 2017-07-24 (×4): 1000 mg via ORAL
  Filled 2017-07-23 (×3): qty 2

## 2017-07-23 MED ORDER — AZELASTINE HCL 0.1 % NA SOLN: 1.0000 | Freq: Every evening | NASAL | Status: DC | PRN

## 2017-07-23 MED ORDER — SODIUM CHLORIDE 0.9 % IJ SOLN
INTRAMUSCULAR | Status: AC
  Filled 2017-07-23: qty 50

## 2017-07-23 MED ORDER — EPHEDRINE SULFATE-NACL 50-0.9 MG/10ML-% IV SOSY
PREFILLED_SYRINGE | INTRAVENOUS | Status: DC | PRN
  Administered 2017-07-23 (×4): 10 mg via INTRAVENOUS
  Administered 2017-07-23: 5 mg via INTRAVENOUS

## 2017-07-23 MED ORDER — OXYCODONE HCL 5 MG PO TABS
10.0000 mg | ORAL_TABLET | ORAL | Status: DC | PRN
  Administered 2017-07-23 – 2017-07-24 (×7): 10 mg via ORAL
  Filled 2017-07-23 (×6): qty 2

## 2017-07-23 MED ORDER — SODIUM CHLORIDE 0.9 % IR SOLN
Status: DC | PRN
  Administered 2017-07-23: 1000 mL

## 2017-07-23 MED ORDER — BUPIVACAINE LIPOSOME 1.3 % IJ SUSP
20.0000 mL | Freq: Once | INTRAMUSCULAR | Status: DC
  Filled 2017-07-23: qty 20

## 2017-07-23 MED ORDER — FLEET ENEMA 7-19 GM/118ML RE ENEM: 1.0000 | ENEMA | Freq: Once | RECTAL | Status: DC | PRN

## 2017-07-23 MED ORDER — ROPIVACAINE HCL 7.5 MG/ML IJ SOLN
INTRAMUSCULAR | Status: DC | PRN
  Administered 2017-07-23: 20 mL via PERINEURAL

## 2017-07-23 MED ORDER — LIDOCAINE 2% (20 MG/ML) 5 ML SYRINGE
INTRAMUSCULAR | Status: AC
  Filled 2017-07-23: qty 5

## 2017-07-23 MED ORDER — PROPOFOL 10 MG/ML IV BOLUS
INTRAVENOUS | Status: AC
  Filled 2017-07-23: qty 20

## 2017-07-23 MED ORDER — ALBUTEROL SULFATE (2.5 MG/3ML) 0.083% IN NEBU: 2.5000 mg | INHALATION_SOLUTION | RESPIRATORY_TRACT | Status: DC | PRN

## 2017-07-23 MED ORDER — TRAMADOL HCL 50 MG PO TABS
50.0000 mg | ORAL_TABLET | Freq: Four times a day (QID) | ORAL | Status: DC | PRN
  Administered 2017-07-24: 100 mg via ORAL
  Filled 2017-07-23: qty 2

## 2017-07-23 MED ORDER — PHENYLEPHRINE 40 MCG/ML (10ML) SYRINGE FOR IV PUSH (FOR BLOOD PRESSURE SUPPORT)
PREFILLED_SYRINGE | INTRAVENOUS | Status: DC | PRN
  Administered 2017-07-23: 80 ug via INTRAVENOUS
  Administered 2017-07-23: 40 ug via INTRAVENOUS
  Administered 2017-07-23 (×2): 80 ug via INTRAVENOUS

## 2017-07-23 MED ORDER — CLONIDINE HCL 0.1 MG PO TABS
0.1000 mg | ORAL_TABLET | Freq: Every day | ORAL | Status: DC
  Administered 2017-07-23: 0.1 mg via ORAL
  Filled 2017-07-23: qty 1

## 2017-07-23 MED ORDER — SODIUM CHLORIDE 0.9 % IV SOLN
INTRAVENOUS | Status: DC
  Administered 2017-07-23: 13:00:00 via INTRAVENOUS

## 2017-07-23 MED ORDER — LIDOCAINE 2% (20 MG/ML) 5 ML SYRINGE
INTRAMUSCULAR | Status: DC | PRN
  Administered 2017-07-23: 100 mg via INTRAVENOUS

## 2017-07-23 MED ORDER — BUPIVACAINE LIPOSOME 1.3 % IJ SUSP
INTRAMUSCULAR | Status: DC | PRN
  Administered 2017-07-23: 20 mL

## 2017-07-23 MED ORDER — TRANEXAMIC ACID 1000 MG/10ML IV SOLN
1000.0000 mg | INTRAVENOUS | Status: AC
  Administered 2017-07-23: 1000 mg via INTRAVENOUS
  Filled 2017-07-23: qty 1100

## 2017-07-23 MED ORDER — AMLODIPINE BESYLATE 5 MG PO TABS
5.0000 mg | ORAL_TABLET | Freq: Every day | ORAL | Status: DC
  Administered 2017-07-24: 5 mg via ORAL
  Filled 2017-07-23: qty 1

## 2017-07-23 MED ORDER — RIVAROXABAN 10 MG PO TABS
10.0000 mg | ORAL_TABLET | Freq: Every day | ORAL | Status: DC
  Administered 2017-07-24: 10 mg via ORAL
  Filled 2017-07-23: qty 1

## 2017-07-23 MED ORDER — METHOCARBAMOL 1000 MG/10ML IJ SOLN
500.0000 mg | Freq: Four times a day (QID) | INTRAVENOUS | Status: DC | PRN
  Filled 2017-07-23: qty 5

## 2017-07-23 MED ORDER — MIDAZOLAM HCL 2 MG/2ML IJ SOLN
1.0000 mg | INTRAMUSCULAR | Status: DC | PRN
Start: 2017-07-23 — End: 2017-07-23

## 2017-07-23 MED ORDER — DIPHENHYDRAMINE HCL 12.5 MG/5ML PO ELIX
12.5000 mg | ORAL_SOLUTION | ORAL | Status: DC | PRN
  Administered 2017-07-24: 25 mg via ORAL
  Filled 2017-07-23: qty 10

## 2017-07-23 MED ORDER — PROPOFOL 500 MG/50ML IV EMUL
INTRAVENOUS | Status: DC | PRN
  Administered 2017-07-23: 40 ug/kg/min via INTRAVENOUS

## 2017-07-23 MED ORDER — MIDAZOLAM HCL 2 MG/2ML IJ SOLN
INTRAMUSCULAR | Status: AC
  Administered 2017-07-23: 2 mg via INTRAVENOUS
  Filled 2017-07-23: qty 2

## 2017-07-23 MED ORDER — SODIUM CHLORIDE 0.9 % IJ SOLN
INTRAMUSCULAR | Status: AC
  Filled 2017-07-23: qty 10

## 2017-07-23 MED ORDER — TRANEXAMIC ACID 1000 MG/10ML IV SOLN
1000.0000 mg | Freq: Once | INTRAVENOUS | Status: AC
  Administered 2017-07-23: 1000 mg via INTRAVENOUS
  Filled 2017-07-23: qty 1100

## 2017-07-23 MED ORDER — MONTELUKAST SODIUM 10 MG PO TABS
10.0000 mg | ORAL_TABLET | Freq: Every day | ORAL | Status: DC
  Administered 2017-07-23: 10 mg via ORAL
  Filled 2017-07-23: qty 1

## 2017-07-23 MED ORDER — DEXAMETHASONE SODIUM PHOSPHATE 10 MG/ML IJ SOLN
10.0000 mg | Freq: Once | INTRAMUSCULAR | Status: AC
  Administered 2017-07-23: 10 mg via INTRAVENOUS

## 2017-07-23 MED ORDER — ALBUTEROL SULFATE HFA 108 (90 BASE) MCG/ACT IN AERS: 2.0000 | INHALATION_SPRAY | RESPIRATORY_TRACT | Status: DC | PRN

## 2017-07-23 MED ORDER — MOMETASONE FURO-FORMOTEROL FUM 100-5 MCG/ACT IN AERO
2.0000 | INHALATION_SPRAY | Freq: Two times a day (BID) | RESPIRATORY_TRACT | Status: DC
  Administered 2017-07-23: 2 via RESPIRATORY_TRACT
  Filled 2017-07-23: qty 8.8

## 2017-07-23 MED ORDER — EPHEDRINE 5 MG/ML INJ
INTRAVENOUS | Status: AC
  Filled 2017-07-23: qty 10

## 2017-07-23 SURGICAL SUPPLY — 49 items
BAG DECANTER FOR FLEXI CONT (MISCELLANEOUS) IMPLANT
BAG SPEC THK2 15X12 ZIP CLS (MISCELLANEOUS) ×1
BAG ZIPLOCK 12X15 (MISCELLANEOUS) ×2 IMPLANT
BANDAGE ACE 6X5 VEL STRL LF (GAUZE/BANDAGES/DRESSINGS) ×2 IMPLANT
BLADE SAG 18X100X1.27 (BLADE) ×2 IMPLANT
BLADE SAW SGTL 11.0X1.19X90.0M (BLADE) ×2 IMPLANT
BOWL SMART MIX CTS (DISPOSABLE) ×2 IMPLANT
CAPT KNEE TOTAL 3 ATTUNE ×1 IMPLANT
CEMENT HV SMART SET (Cement) ×4 IMPLANT
COVER SURGICAL LIGHT HANDLE (MISCELLANEOUS) ×2 IMPLANT
CUFF TOURN SGL QUICK 34 (TOURNIQUET CUFF) ×2
CUFF TRNQT CYL 34X4X40X1 (TOURNIQUET CUFF) ×1 IMPLANT
DECANTER SPIKE VIAL GLASS SM (MISCELLANEOUS) ×2 IMPLANT
DRAPE U-SHAPE 47X51 STRL (DRAPES) ×2 IMPLANT
DRSG ADAPTIC 3X8 NADH LF (GAUZE/BANDAGES/DRESSINGS) ×2 IMPLANT
DRSG PAD ABDOMINAL 8X10 ST (GAUZE/BANDAGES/DRESSINGS) ×2 IMPLANT
DURAPREP 26ML APPLICATOR (WOUND CARE) ×2 IMPLANT
ELECT REM PT RETURN 15FT ADLT (MISCELLANEOUS) ×2 IMPLANT
EVACUATOR 1/8 PVC DRAIN (DRAIN) ×2 IMPLANT
GAUZE SPONGE 4X4 12PLY STRL (GAUZE/BANDAGES/DRESSINGS) ×2 IMPLANT
GLOVE BIO SURGEON STRL SZ7.5 (GLOVE) IMPLANT
GLOVE BIO SURGEON STRL SZ8 (GLOVE) ×2 IMPLANT
GLOVE BIOGEL PI IND STRL 6.5 (GLOVE) IMPLANT
GLOVE BIOGEL PI IND STRL 8 (GLOVE) ×1 IMPLANT
GLOVE BIOGEL PI INDICATOR 6.5 (GLOVE)
GLOVE BIOGEL PI INDICATOR 8 (GLOVE) ×1
GLOVE SURG SS PI 6.5 STRL IVOR (GLOVE) ×6 IMPLANT
GOWN STRL REUS W/TWL LRG LVL3 (GOWN DISPOSABLE) ×3 IMPLANT
GOWN STRL REUS W/TWL XL LVL3 (GOWN DISPOSABLE) ×4 IMPLANT
HANDPIECE INTERPULSE COAX TIP (DISPOSABLE) ×2
IMMOBILIZER KNEE 20 (SOFTGOODS) ×2
IMMOBILIZER KNEE 20 THIGH 36 (SOFTGOODS) ×1 IMPLANT
MANIFOLD NEPTUNE II (INSTRUMENTS) ×2 IMPLANT
NS IRRIG 1000ML POUR BTL (IV SOLUTION) ×2 IMPLANT
PACK TOTAL KNEE CUSTOM (KITS) ×2 IMPLANT
PADDING CAST COTTON 6X4 STRL (CAST SUPPLIES) ×4 IMPLANT
POSITIONER SURGICAL ARM (MISCELLANEOUS) ×2 IMPLANT
SET HNDPC FAN SPRY TIP SCT (DISPOSABLE) ×1 IMPLANT
STRIP CLOSURE SKIN 1/2X4 (GAUZE/BANDAGES/DRESSINGS) ×2 IMPLANT
SUT MNCRL AB 4-0 PS2 18 (SUTURE) ×2 IMPLANT
SUT STRATAFIX 0 PDS 27 VIOLET (SUTURE) ×2
SUT VIC AB 2-0 CT1 27 (SUTURE) ×6
SUT VIC AB 2-0 CT1 TAPERPNT 27 (SUTURE) ×3 IMPLANT
SUTURE STRATFX 0 PDS 27 VIOLET (SUTURE) ×1 IMPLANT
SYR 30ML LL (SYRINGE) ×4 IMPLANT
TRAY FOLEY CATH 14FR (SET/KITS/TRAYS/PACK) ×1 IMPLANT
WATER STERILE IRR 1000ML POUR (IV SOLUTION) ×4 IMPLANT
WRAP KNEE MAXI GEL POST OP (GAUZE/BANDAGES/DRESSINGS) ×2 IMPLANT
YANKAUER SUCT BULB TIP 10FT TU (MISCELLANEOUS) ×2 IMPLANT

## 2017-07-23 NOTE — Transfer of Care (Signed)
Immediate Anesthesia Transfer of Care Note  Patient: [name]  Procedure(s) Performed: RIGHT TOTAL KNEE ARTHROPLASTY (Right Knee)  Patient Location: PACU  Anesthesia Type:Spinal  Level of Consciousness: drowsy and patient cooperative  Airway & Oxygen Therapy: Patient Spontanous Breathing and Patient connected to face mask oxygen  Post-op Assessment: Report given to RN and Post -op Vital signs reviewed and stable  Post vital signs: Reviewed and stable  Last Vitals:  Vitals:   07/23/17 0849 07/23/17 0854  BP: 109/81 118/77  Pulse: 70 69  Resp: 14 14  Temp:    SpO2: 94% 94%    Last Pain:  Vitals:   07/23/17 0728  TempSrc: Oral         Complications: No apparent anesthesia complications

## 2017-07-23 NOTE — Discharge Instructions (Addendum)
° °Dr. Frank Aluisio °Total Joint Specialist °[facility] °[address] °[place], [place] [address] °[phone] ° °TOTAL KNEE REPLACEMENT POSTOPERATIVE DIRECTIONS ° °Knee Rehabilitation, Guidelines Following Surgery  °Results after knee surgery are often greatly improved when you follow the exercise, range of motion and muscle strengthening exercises prescribed by your doctor. Safety measures are also important to protect the knee from further injury. Any time any of these exercises cause you to have increased pain or swelling in your knee joint, decrease the amount until you are comfortable again and slowly increase them. If you have problems or questions, call your caregiver or physical therapist for advice.  ° °HOME CARE INSTRUCTIONS  °Remove items at home which could result in a fall. This includes throw rugs or furniture in walking pathways.  °· ICE to the affected knee every three hours for 30 minutes at a time and then as needed for pain and swelling.  Continue to use ice on the knee for pain and swelling from surgery. You may notice swelling that will progress down to the foot and ankle.  This is normal after surgery.  Elevate the leg when you are not up walking on it.   °· Continue to use the breathing machine which will help keep your temperature down.  It is common for your temperature to cycle up and down following surgery, especially at night when you are not up moving around and exerting yourself.  The breathing machine keeps your lungs expanded and your temperature down. °· Do not place pillow under knee, focus on keeping the knee straight while resting ° °DIET °You may resume your previous home diet once your are discharged from the hospital. ° °DRESSING / WOUND CARE / SHOWERING °You may shower 3 days after surgery, but keep the wounds dry during showering.  You may use an occlusive plastic wrap (Press'n Seal for example), NO SOAKING/SUBMERGING IN THE BATHTUB.  If the  bandage gets wet, change with a clean dry gauze.  If the incision gets wet, pat the wound dry with a clean towel. °You may start showering once you are discharged home but do not submerge the incision under water. Just pat the incision dry and apply a dry gauze dressing on daily. °Change the surgical dressing daily and reapply a dry dressing each time. ° °ACTIVITY °Walk with your walker as instructed. °Use walker as long as suggested by your caregivers. °Avoid periods of inactivity such as sitting longer than an hour when not asleep. This helps prevent blood clots.  °You may resume a sexual relationship in one month or when given the OK by your doctor.  °You may return to work once you are cleared by your doctor.  °Do not drive a car for 6 weeks or until released by you surgeon.  °Do not drive while taking narcotics. ° °WEIGHT BEARING °Weight bearing as tolerated with assist device (walker, cane, etc) as directed, use it as long as suggested by your surgeon or therapist, typically at least 4-6 weeks. ° °POSTOPERATIVE CONSTIPATION PROTOCOL °Constipation - defined medically as fewer than three stools per week and severe constipation as less than one stool per week. ° °One of the most common issues patients have following surgery is constipation.  Even if you have a regular bowel pattern at home, your normal regimen is likely to be disrupted due to multiple reasons following surgery.  Combination of anesthesia, postoperative narcotics, change in appetite and fluid intake all can affect your bowels.    In order to avoid complications following surgery, here are some recommendations in order to help you during your recovery period. ° °Colace (docusate) - Pick up an over-the-counter form of Colace or another stool softener and take twice a day as long as you are requiring postoperative pain medications.  Take with a full glass of water daily.  If you experience loose stools or diarrhea, hold the colace until you stool forms  back up.  If your symptoms do not get better within 1 week or if they get worse, check with your doctor. ° °Dulcolax (bisacodyl) - Pick up over-the-counter and take as directed by the product packaging as needed to assist with the movement of your bowels.  Take with a full glass of water.  Use this product as needed if not relieved by Colace only.  ° °MiraLax (polyethylene glycol) - Pick up over-the-counter to have on hand.  MiraLax is a solution that will increase the amount of water in your bowels to assist with bowel movements.  Take as directed and can mix with a glass of water, juice, soda, coffee, or tea.  Take if you go more than two days without a movement. °Do not use MiraLax more than once per day. Call your doctor if you are still constipated or irregular after using this medication for 7 days in a row. ° °If you continue to have problems with postoperative constipation, please contact the office for further assistance and recommendations.  If you experience "the worst abdominal pain ever" or develop nausea or vomiting, please contact the office immediatly for further recommendations for treatment. ° °ITCHING ° If you experience itching with your medications, try taking only a single pain pill, or even half a pain pill at a time.  You can also use Benadryl over the counter for itching or also to help with sleep.  ° °TED HOSE STOCKINGS °Wear the elastic stockings on both legs for three weeks following surgery during the day but you may remove then at night for sleeping. ° °MEDICATIONS °See your medication summary on the “After Visit Summary” that the nursing staff will review with you prior to discharge.  You may have some home medications which will be placed on hold until you complete the course of blood thinner medication.  It is important for you to complete the blood thinner medication as prescribed by your surgeon.  Continue your approved medications as instructed at time of  discharge. ° °PRECAUTIONS °If you experience chest pain or shortness of breath - call 911 immediately for transfer to the hospital emergency department.  °If you develop a fever greater that 101 F, purulent drainage from wound, increased redness or drainage from wound, foul odor from the wound/dressing, or calf pain - CONTACT YOUR SURGEON.   °                                                °FOLLOW-UP APPOINTMENTS °Make sure you keep all of your appointments after your operation with your surgeon and caregivers. You should call the office at the above phone number and make an appointment for approximately two weeks after the date of your surgery or on the date instructed by your surgeon outlined in the "After Visit Summary". ° ° °RANGE OF MOTION AND STRENGTHENING EXERCISES  °Rehabilitation of the knee is important following a knee injury or   an operation. After just a few days of immobilization, the muscles of the thigh which control the knee become weakened and shrink (atrophy). Knee exercises are designed to build up the tone and strength of the thigh muscles and to improve knee motion. Often times heat used for twenty to thirty minutes before working out will loosen up your tissues and help with improving the range of motion but do not use heat for the first two weeks following surgery. These exercises can be done on a training (exercise) mat, on the floor, on a table or on a bed. Use what ever works the best and is most comfortable for you Knee exercises include:  °Leg Lifts - While your knee is still immobilized in a splint or cast, you can do straight leg raises. Lift the leg to 60 degrees, hold for 3 sec, and slowly lower the leg. Repeat 10-20 times 2-3 times daily. Perform this exercise against resistance later as your knee gets better.  °Quad and Hamstring Sets - Tighten up the muscle on the front of the thigh (Quad) and hold for 5-10 sec. Repeat this 10-20 times hourly. Hamstring sets are done by pushing the  foot backward against an object and holding for 5-10 sec. Repeat as with quad sets.  °· Leg Slides: Lying on your back, slowly slide your foot toward your buttocks, bending your knee up off the floor (only go as far as is comfortable). Then slowly slide your foot back down until your leg is flat on the floor again. °· Angel Wings: Lying on your back spread your legs to the side as far apart as you can without causing discomfort.  °A rehabilitation program following serious knee injuries can speed recovery and prevent re-injury in the future due to weakened muscles. Contact your doctor or a physical therapist for more information on knee rehabilitation.  ° °IF YOU ARE TRANSFERRED TO A SKILLED REHAB FACILITY °If the patient is transferred to a skilled rehab facility following release from the hospital, a list of the current medications will be sent to the facility for the patient to continue.  When discharged from the skilled rehab facility, please have the facility set up the patient's Home Health Physical Therapy prior to being released. Also, the skilled facility will be responsible for providing the patient with their medications at time of release from the facility to include their pain medication, the muscle relaxants, and their blood thinner medication. If the patient is still at the rehab facility at time of the two week follow up appointment, the skilled rehab facility will also need to assist the patient in arranging follow up appointment in our office and any transportation needs. ° °MAKE SURE YOU:  °Understand these instructions.  °Get help right away if you are not doing well or get worse.  ° ° °Pick up stool softner and laxative for home use following surgery while on pain medications. °Do not submerge incision under water. °Please use good hand washing techniques while changing dressing each day. °May shower starting three days after surgery. °Please use a clean towel to pat the incision dry following  showers. °Continue to use ice for pain and swelling after surgery. °Do not use any lotions or creams on the incision until instructed by your surgeon. ° °Take Xarelto for two and a half more weeks following discharge from the hospital, then discontinue Xarelto. °Once the patient has completed the blood thinner regimen, then take a Baby 81 mg Aspirin daily for three   more weeks.     Information on my medicine - XARELTO (Rivaroxaban)  This medication education was reviewed with me or my healthcare representative as part of my discharge preparation.  The pharmacist that spoke with me during my hospital stay was:  Kara Mead, [facility]  Why was Xarelto prescribed for you? Xarelto was prescribed for you to reduce the risk of blood clots forming after orthopedic surgery. The medical term for these abnormal blood clots is venous thromboembolism (VTE).  What do you need to know about xarelto ? Take your Xarelto ONCE DAILY at the same time every day. You may take it either with or without food.  If you have difficulty swallowing the tablet whole, you may crush it and mix in applesauce just prior to taking your dose.  Take Xarelto exactly as prescribed by your doctor and DO NOT stop taking Xarelto without talking to the doctor who prescribed the medication.  Stopping without other VTE prevention medication to take the place of Xarelto may increase your risk of developing a clot.  After discharge, you should have regular check-up appointments with your healthcare provider that is prescribing your Xarelto.    What do you do if you miss a dose? If you miss a dose, take it as soon as you remember on the same day then continue your regularly scheduled once daily regimen the next day. Do not take two doses of Xarelto on the same day.   Important Safety Information A possible side effect of Xarelto is bleeding. You should call your healthcare provider right away if you experience any of the  following: ? Bleeding from an injury or your nose that does not stop. ? Unusual colored urine (red or dark brown) or unusual colored stools (red or black). ? Unusual bruising for unknown reasons. ? A serious fall or if you hit your head (even if there is no bleeding).  Some medicines may interact with Xarelto and might increase your risk of bleeding while on Xarelto. To help avoid this, consult your healthcare provider or pharmacist prior to using any new prescription or non-prescription medications, including herbals, vitamins, non-steroidal anti-inflammatory drugs (NSAIDs) and supplements.  This website has more information on Xarelto: [email or web address]

## 2017-07-23 NOTE — Op Note (Signed)
OPERATIVE REPORT-TOTAL KNEE ARTHROPLASTY   Pre-operative diagnosis- Osteoarthritis  Right knee(s)  Post-operative diagnosis- Osteoarthritis Right knee(s)  Procedure-  Right  Total Knee Arthroplasty  Surgeon- Dione Plover. Tyah Acord, MD  Assistant- Ardeen Jourdain, PA-C   Anesthesia-  Adductor canal block and spinal  EBL-50 mL   Drains Hemovac  Tourniquet time- 29 minutes @ 989 mm Hg    Complications- None  Condition-PACU - hemodynamically stable.   Brief Clinical Note  [name] is a 66 y.o. year old female with end stage OA of her right knee with progressively worsening pain and dysfunction. She has constant pain, with activity and at rest and significant functional deficits with difficulties even with ADLs. She has had extensive non-op management including analgesics, injections of cortisone and viscosupplements, and home exercise program, but remains in significant pain with significant dysfunction.Radiographs show bone on bone arthritis medial and patellofemoral. She presents now for right Total Knee Arthroplasty.    Procedure in detail---   The patient is brought into the operating room and positioned supine on the operating table. After successful administration of  Adductor canal block and spinal,   a tourniquet is placed high on the  Right thigh(s) and the lower extremity is prepped and draped in the usual sterile fashion. Time out is performed by the operating team and then the  Right lower extremity is wrapped in Esmarch, knee flexed and the tourniquet inflated to 300 mmHg.       A midline incision is made with a ten blade through the subcutaneous tissue to the level of the extensor mechanism. A fresh blade is used to make a medial parapatellar arthrotomy. Soft tissue over the proximal medial tibia is subperiosteally elevated to the joint line with a knife and into the semimembranosus bursa with a Cobb elevator. Soft tissue over the proximal lateral tibia is elevated with  attention being paid to avoiding the patellar tendon on the tibial tubercle. The patella is everted, knee flexed 90 degrees and the ACL and PCL are removed. Findings are bone on bone medial and patellofemoral with large global osteophytes.        The drill is used to create a starting hole in the distal femur and the canal is thoroughly irrigated with sterile saline to remove the fatty contents. The 5 degree Right  valgus alignment guide is placed into the femoral canal and the distal femoral cutting block is pinned to remove 9 mm off the distal femur. Resection is made with an oscillating saw.      The tibia is subluxed forward and the menisci are removed. The extramedullary alignment guide is placed referencing proximally at the medial aspect of the tibial tubercle and distally along the second metatarsal axis and tibial crest. The block is pinned to remove 74mm off the more deficient medial  side. Resection is made with an oscillating saw. Size 6is the most appropriate size for the tibia and the proximal tibia is prepared with the modular drill and keel punch for that size.      The femoral sizing guide is placed and size 6 is most appropriate. Rotation is marked off the epicondylar axis and confirmed by creating a rectangular flexion gap at 90 degrees. The size 6 cutting block is pinned in this rotation and the anterior, posterior and chamfer cuts are made with the oscillating saw. The intercondylar block is then placed and that cut is made.      Trial size 6 tibial component, trial size 6  posterior stabilized femur and a 10  mm posterior stabilized rotating platform insert trial is placed. Full extension is achieved with excellent varus/valgus and anterior/posterior balance throughout full range of motion. The patella is everted and thickness measured to be 22  mm. Free hand resection is taken to 12 mm, a 38 template is placed, lug holes are drilled, trial patella is placed, and it tracks normally.  Osteophytes are removed off the posterior femur with the trial in place. All trials are removed and the cut bone surfaces prepared with pulsatile lavage. Cement is mixed and once ready for implantation, the size 6 tibial implant, size  6 posterior stabilized femoral component, and the size 38 patella are cemented in place and the patella is held with the clamp. The trial insert is placed and the knee held in full extension. The Exparel (20 ml mixed with 60 ml saline) is injected into the extensor mechanism, posterior capsule, medial and lateral gutters and subcutaneous tissues.  All extruded cement is removed and once the cement is hard the permanent 10 mm posterior stabilized rotating platform insert is placed into the tibial tray.      The wound is copiously irrigated with saline solution and the extensor mechanism closed over a hemovac drain with #1 V-loc suture. The tourniquet is released for a total tourniquet time of 29  minutes. Flexion against gravity is 140 degrees and the patella tracks normally. Subcutaneous tissue is closed with 2.0 vicryl and subcuticular with running 4.0 Monocryl. The incision is cleaned and dried and steri-strips and a bulky sterile dressing are applied. The limb is placed into a knee immobilizer and the patient is awakened and transported to recovery in stable condition.      Please note that a surgical assistant was a medical necessity for this procedure in order to perform it in a safe and expeditious manner. Surgical assistant was necessary to retract the ligaments and vital neurovascular structures to prevent injury to them and also necessary for proper positioning of the limb to allow for anatomic placement of the prosthesis.   Dione Plover Christian Borgerding, MD    07/23/2017, 10:15 AM

## 2017-07-23 NOTE — Progress Notes (Signed)
AssistedDr. Lyndle Herrlich with right, ultrasound guided, adductor canal block. Side rails up, monitors on throughout procedure. See vital signs in flow sheet. Tolerated Procedure well.

## 2017-07-23 NOTE — Anesthesia Procedure Notes (Signed)
Anesthesia Regional Block: Adductor canal block   Pre-Anesthetic Checklist: ,, timeout performed, Correct Patient, Correct Site, Correct Laterality, Correct Procedure, Correct Position, site marked, Risks and benefits discussed, pre-op evaluation,  At surgeon's request and post-op pain management  Laterality: Right  Prep: chloraprep       Needles:   Needle Type: Echogenic Needle     Needle Length: 10cm  Needle Gauge: 21     Additional Needles:   Procedures:,,,, ultrasound used (permanent image in chart),,,,  Narrative:  Start time: 07/23/2017 8:41 AM End time: 07/23/2017 8:46 AM Injection made incrementally with aspirations every 5 mL. Anesthesiologist: Lyndle Herrlich, MD

## 2017-07-23 NOTE — Anesthesia Preprocedure Evaluation (Addendum)
Anesthesia Evaluation  Patient identified by MRN, date of birth, ID band Patient awake    Reviewed: Allergy & Precautions, H&P , Patient's Chart, lab work & pertinent test results, reviewed documented beta blocker date and time   Airway Mallampati: II  TM Distance: >3 FB Neck ROM: full    Dental no notable dental hx.    Pulmonary    Pulmonary exam normal breath sounds clear to auscultation       Cardiovascular hypertension,  Rhythm:regular Rate:Normal     Neuro/Psych    GI/Hepatic   Endo/Other    Renal/GU      Musculoskeletal   Abdominal   Peds  Hematology   Anesthesia Other Findings   Reproductive/Obstetrics                             Anesthesia Physical Anesthesia Plan  ASA: II  Anesthesia Plan: Spinal   Post-op Pain Management:    Induction:   PONV Risk Score and Plan:   Airway Management Planned:   Additional Equipment:   Intra-op Plan:   Post-operative Plan:   Informed Consent: I have reviewed the patients History and Physical, chart, labs and discussed the procedure including the risks, benefits and alternatives for the proposed anesthesia with the patient or authorized representative who has indicated his/her understanding and acceptance.   Dental Advisory Given  Plan Discussed with: CRNA and Surgeon  Anesthesia Plan Comments: (  )        Anesthesia Quick Evaluation

## 2017-07-23 NOTE — Interval H&P Note (Signed)
History and Physical Interval Note:  07/23/2017 7:15 AM  [name]  has presented today for surgery, with the diagnosis of Osteoarthritis Right knee  The various methods of treatment have been discussed with the patient and family. After consideration of risks, benefits and other options for treatment, the patient has consented to  Procedure(s): RIGHT TOTAL KNEE ARTHROPLASTY (Right) as a surgical intervention .  The patient's history has been reviewed, patient examined, no change in status, stable for surgery.  I have reviewed the patient's chart and labs.  Questions were answered to the patient's satisfaction.     Pilar Plate Evalie Hargraves

## 2017-07-23 NOTE — Anesthesia Postprocedure Evaluation (Signed)
Anesthesia Post Note  Patient: [name]  Procedure(s) Performed: RIGHT TOTAL KNEE ARTHROPLASTY (Right Knee)     Patient location during evaluation: PACU Anesthesia Type: Spinal Level of consciousness: awake Pain management: satisfactory to patient Vital Signs Assessment: post-procedure vital signs reviewed and stable Respiratory status: spontaneous breathing Cardiovascular status: blood pressure returned to baseline Postop Assessment: no headache and spinal receding Anesthetic complications: no    Last Vitals:  Vitals:   07/23/17 1342 07/23/17 1442  BP: 121/68 110/72  Pulse: 90 92  Resp: 16 16  Temp: 36.6 C 36.6 C  SpO2: 98% 93%    Last Pain:  Vitals:   07/23/17 1500  TempSrc:   PainSc: 2                  [name]

## 2017-07-23 NOTE — Evaluation (Signed)
Physical Therapy Evaluation Patient Details Name: [name] MRN: [id number] DOB: 1951-06-10 Today's Date: 07/23/2017   History of Present Illness  R TKA  Clinical Impression  Pt is s/p TKA resulting in the deficits listed below (see PT Problem List). Pt ambulated 24' with RW, distance limited by buckling of RLE. Good progress expected.  Pt will benefit from skilled PT to increase their independence and safety with mobility to allow discharge to the venue listed below.      Follow Up Recommendations DC plan and follow up therapy as arranged by surgeon    Equipment Recommendations       Recommendations for Other Services       Precautions / Restrictions Precautions Precautions: Fall;Knee Precaution Comments: reviewed no pillow under knee Restrictions Weight Bearing Restrictions: No Other Position/Activity Restrictions: WBAT      Mobility  Bed Mobility Overal bed mobility: Needs Assistance Bed Mobility: Supine to Sit     Supine to sit: Min assist     General bed mobility comments: min A RLE OOB  Transfers Overall transfer level: Needs assistance Equipment used: Rolling walker (2 wheeled) Transfers: Sit to/from Stand Sit to Stand: Min assist         General transfer comment: min A to rise, VCs hand placement  Ambulation/Gait Ambulation/Gait assistance: Min assist Ambulation Distance (Feet): 24 Feet Assistive device: Rolling walker (2 wheeled) Gait Pattern/deviations: Step-to pattern   Gait velocity interpretation: Below normal speed for age/gender General Gait Details: RLE buckled after walking 24', pt reported nausea, BP 99/63 in sitting after walking, SaO2 100%  Stairs            Wheelchair Mobility    Modified Rankin (Stroke Patients Only)       Balance Overall balance assessment: Modified Independent                                           Pertinent Vitals/Pain Pain Assessment: 0-10 Pain Score: 3  Pain  Location: R knee Pain Descriptors / Indicators: Sore Pain Intervention(s): Limited activity within patient's tolerance;Premedicated before session;Monitored during session;Repositioned;Ice applied    Home Living Family/patient expects to be discharged to:: Private residence Living Arrangements: Spouse/significant other Available Help at Discharge: Family;Available 24 hours/day Type of Home: House Home Access: Stairs to enter Entrance Stairs-Rails: Right Entrance Stairs-Number of Steps: 4 Home Layout: One level Home Equipment: Grab bars - tub/shower;Walker - 2 wheels;Cane - single point      Prior Function Level of Independence: Independent with assistive device(s)         Comments: used RW or SPC prn for RLE buckling     Hand Dominance        Extremity/Trunk Assessment   Upper Extremity Assessment Upper Extremity Assessment: Overall WFL for tasks assessed    Lower Extremity Assessment Lower Extremity Assessment: RLE deficits/detail RLE Deficits / Details: 3/5 SLR, 5-45* AAROM R knee, 3/5 knee ext    Cervical / Trunk Assessment Cervical / Trunk Assessment: Normal  Communication   Communication: No difficulties  Cognition Arousal/Alertness: Awake/alert Behavior During Therapy: WFL for tasks assessed/performed                                          General Comments      Exercises Total Joint  Exercises Ankle Circles/Pumps: AROM;Both;10 reps;Supine Quad Sets: AROM;Both;5 reps;Supine Heel Slides: AAROM;Right;10 reps;Supine Straight Leg Raises: AROM;Right;5 reps;Supine Long Arc Quad: AROM;Right;5 reps;Seated Goniometric ROM: 5-45* AAROM R knee   Assessment/Plan    PT Assessment Patient needs continued PT services  PT Problem List Decreased strength;Decreased range of motion;Decreased activity tolerance;Decreased mobility;Pain       PT Treatment Interventions DME instruction;Gait training;Stair training;Functional mobility  training;Therapeutic activities;Therapeutic exercise    PT Goals (Current goals can be found in the Care Plan section)  Acute Rehab PT Goals Patient Stated Goal: to walk PT Goal Formulation: With patient Time For Goal Achievement: 07/30/17 Potential to Achieve Goals: Good    Frequency 7X/week   Barriers to discharge        Co-evaluation               AM-PAC PT "6 Clicks" Daily Activity  Outcome Measure Difficulty turning over in bed (including adjusting bedclothes, sheets and blankets)?: A Little Difficulty moving from lying on back to sitting on the side of the bed? : Unable Difficulty sitting down on and standing up from a chair with arms (e.g., wheelchair, bedside commode, etc,.)?: Unable Help needed moving to and from a bed to chair (including a wheelchair)?: A Little Help needed walking in hospital room?: A Little Help needed climbing 3-5 steps with a railing? : A Lot 6 Click Score: 13    End of Session Equipment Utilized During Treatment: Gait belt Activity Tolerance: Patient tolerated treatment well Patient left: in chair;with call bell/phone within reach Nurse Communication: Mobility status PT Visit Diagnosis: Unsteadiness on feet (R26.81);Muscle weakness (generalized) (M62.81);Pain Pain - Right/Left: Right Pain - part of body: Knee    Time: 9563-8756 PT Time Calculation (min) (ACUTE ONLY): 33 min   Charges:   PT Evaluation $PT Eval Low Complexity: 1 Low PT Treatments $Gait Training: 8-22 mins   PT G Codes:        Philomena Doheny 07/23/2017, 3:40 PM [phone]

## 2017-07-23 NOTE — Anesthesia Procedure Notes (Signed)
Spinal  Patient location during procedure: OR Staffing Anesthesiologist: Lyndle Herrlich, MD Spinal Block Patient position: sitting Prep: DuraPrep Patient monitoring: heart rate, blood pressure and continuous pulse ox Approach: right paramedian Location: L3-4 Injection technique: single-shot Needle Needle type: Sprotte  Needle gauge: 24 G Needle length: 9 cm Assessment Sensory level: T4 Additional Notes Spinal Dosage in OR  .75% Bupivicaine ml       1.8

## 2017-07-24 DIAGNOSIS — M1711 Unilateral primary osteoarthritis, right knee: Secondary | ICD-10-CM | POA: Diagnosis not present

## 2017-07-24 LAB — CBC
HCT: 31.5 % — ABNORMAL LOW (ref 36.0–46.0)
Hemoglobin: 9.6 g/dL — ABNORMAL LOW (ref 12.0–15.0)
MCH: 23 pg — AB (ref 26.0–34.0)
MCHC: 30.5 g/dL (ref 30.0–36.0)
MCV: 75.4 fL — AB (ref 78.0–100.0)
PLATELETS: 246 10*3/uL (ref 150–400)
RBC: 4.18 MIL/uL (ref 3.87–5.11)
RDW: 16.4 % — AB (ref 11.5–15.5)
WBC: 12.4 10*3/uL — AB (ref 4.0–10.5)

## 2017-07-24 LAB — BASIC METABOLIC PANEL
ANION GAP: 8 (ref 5–15)
BUN: 14 mg/dL (ref 6–20)
CALCIUM: 8.9 mg/dL (ref 8.9–10.3)
CO2: 26 mmol/L (ref 22–32)
Chloride: 104 mmol/L (ref 101–111)
Creatinine, Ser: 0.87 mg/dL (ref 0.44–1.00)
GFR calc Af Amer: >60 mL/min (ref >60)
GLUCOSE: 130 mg/dL — AB (ref 65–99)
Potassium: 4.7 mmol/L (ref 3.5–5.1)
Sodium: 138 mmol/L (ref 135–145)

## 2017-07-24 MED ORDER — ONDANSETRON HCL 4 MG PO TABS: 4.0000 mg | ORAL_TABLET | Freq: Four times a day (QID) | ORAL | 0 refills | Status: DC | PRN

## 2017-07-24 MED ORDER — OXYCODONE HCL 5 MG PO TABS: 5.0000 mg | ORAL_TABLET | ORAL | 0 refills | Status: DC | PRN

## 2017-07-24 MED ORDER — METHOCARBAMOL 500 MG PO TABS: 500.0000 mg | ORAL_TABLET | Freq: Four times a day (QID) | ORAL | 0 refills | Status: DC | PRN

## 2017-07-24 MED ORDER — ALUM & MAG HYDROXIDE-SIMETH 200-200-20 MG/5ML PO SUSP
15.0000 mL | ORAL | Status: DC | PRN
  Administered 2017-07-24: 15 mL via ORAL
  Filled 2017-07-24: qty 30

## 2017-07-24 MED ORDER — RIVAROXABAN 10 MG PO TABS
10.0000 mg | ORAL_TABLET | Freq: Every day | ORAL | 0 refills | Status: DC
Start: 2017-07-25 — End: 2018-01-01

## 2017-07-24 MED ORDER — TRAMADOL HCL 50 MG PO TABS: 50.0000 mg | ORAL_TABLET | Freq: Four times a day (QID) | ORAL | 0 refills | Status: DC | PRN

## 2017-07-24 NOTE — Evaluation (Signed)
Occupational Therapy Evaluation Patient Details Name: [name] MRN: [id number] DOB: 09-Sep-1950 Today's Date: 07/24/2017    History of Present Illness R TKA   Clinical Impression   OT eval and education complete    Follow Up Recommendations  No OT follow up    Equipment Recommendations  None recommended by OT    Recommendations for Other Services       Precautions / Restrictions Precautions Precautions: Fall;Knee Precaution Comments: reviewed no pillow under knee Restrictions Weight Bearing Restrictions: No Other Position/Activity Restrictions: WBAT      Mobility Bed Mobility Overal bed mobility: Needs Assistance Bed Mobility: Supine to Sit     Supine to sit: Supervision     General bed mobility comments: [name]  Transfers Overall transfer level: Needs assistance Equipment used: Rolling walker (2 wheeled) Transfers: Sit to/from [facility] Sit to Stand: Supervision Stand pivot transfers: Supervision       General transfer comment: cues for hand placement and RLE    Balance Overall balance assessment: Modified Independent                                         ADL either performed or assessed with clinical judgement   ADL Overall ADL's : Needs assistance/impaired Eating/Feeding: Set up;Sitting   Grooming: Set up;Sitting   Upper Body Bathing: Set up;Sitting   Lower Body Bathing: Min guard;Cueing for sequencing;Cueing for safety;Sit to/from stand;With caregiver independent assisting   Upper Body Dressing : Set up;Sitting   Lower Body Dressing: Min guard;Cueing for safety;Cueing for sequencing;With caregiver independent assisting   Toilet Transfer: Supervision/safety;Comfort height toilet;RW   Toileting- Clothing Manipulation and Hygiene: Supervision/safety;Sit to/from stand;Cueing for safety   Tub/ Shower Transfer: Copy Details (indicate cue type and reason):  verbalized safety Functional mobility during ADLs: Min guard;Rolling walker General ADL Comments: husband will A As needed     Vision Patient Visual Report: No change from baseline              Pertinent Vitals/Pain Pain Assessment: 0-10 Pain Score: 3  Pain Location: R knee Pain Descriptors / Indicators: Sore Pain Intervention(s): Limited activity within patient's tolerance        Extremity/Trunk Assessment Upper Extremity Assessment Upper Extremity Assessment: Overall WFL for tasks assessed           Communication Communication Communication: No difficulties   Cognition Arousal/Alertness: Awake/alert Behavior During Therapy: WFL for tasks assessed/performed Overall Cognitive Status: Within Functional Limits for tasks assessed                                                Home Living Family/patient expects to be discharged to:: Private residence Living Arrangements: Spouse/significant other Available Help at Discharge: Family;Available 24 hours/day Type of Home: House Home Access: Stairs to enter [employer] of Steps: 4 Entrance Stairs-Rails: Right Home Layout: One level     Bathroom Shower/Tub: Occupational psychologist: Handicapped height     Home Equipment: Grab bars - tub/shower;Walker - 2 wheels;Cane - single point          Prior Functioning/Environment Level of Independence: Independent with assistive device(s)        Comments: used RW or SPC prn for RLE  buckling        OT Problem List:        OT Treatment/Interventions:      OT Goals(Current goals can be found in the care plan section) Acute Rehab OT Goals Patient Stated Goal: to walk  OT Frequency:                AM-PAC PT "6 Clicks" Daily Activity     Outcome Measure Help from another person eating meals?: None Help from another person taking care of personal grooming?: None Help from another person toileting, which includes using  toliet, bedpan, or urinal?: A Little Help from another person bathing (including washing, rinsing, drying)?: A Little Help from another person to put on and taking off regular upper body clothing?: None Help from another person to put on and taking off regular lower body clothing?: A Little 6 Click Score: 21   End of Session Nurse Communication: Mobility status  Activity Tolerance: Patient tolerated treatment well Patient left: in chair  OT Visit Diagnosis: Muscle weakness (generalized) (M62.81)                Time: 0947-0962 OT Time Calculation (min): 14 min Charges:  OT General Charges $OT Visit: 1 Visit OT Evaluation $OT Eval Low Complexity: 1 Low G-Codes:     [name], OT [phone]  [name] D 07/24/2017, 11:42 AM

## 2017-07-24 NOTE — Progress Notes (Signed)
Physical Therapy Treatment Patient Details Name: [name] MRN: [id number] DOB: [birth year] Today's Date: 07/24/2017    History of Present Illness R TKA    PT Comments    Pt progressing  Well; nauseous this am but feeling better now; will see again for further education and stair training   Follow Up Recommendations  DC plan and follow up therapy as arranged by surgeon     Equipment Recommendations  None recommended by PT    Recommendations for Other Services       Precautions / Restrictions Precautions Precautions: Fall;Knee Precaution Comments: reviewed no pillow under knee Restrictions Weight Bearing Restrictions: No Other Position/Activity Restrictions: WBAT    Mobility  Bed Mobility Overal bed mobility: Needs Assistance Bed Mobility: Supine to Sit     Supine to sit: Supervision     General bed mobility comments: for safety  Transfers Overall transfer level: Needs assistance Equipment used: Rolling walker (2 wheeled) Transfers: Sit to/from Stand Sit to Stand: Min guard         General transfer comment: cues for hand placement and RLE  Ambulation/Gait Ambulation/Gait assistance: Min guard Ambulation Distance (Feet): 80 Feet Assistive device: Rolling walker (2 wheeled) Gait Pattern/deviations: Step-to pattern;Step-through pattern     General Gait Details: cues for  sequence, step length   Stairs            Wheelchair Mobility    Modified Rankin (Stroke Patients Only)       Balance                                            Cognition Arousal/Alertness: Awake/alert Behavior During Therapy: WFL for tasks assessed/performed Overall Cognitive Status: Within Functional Limits for tasks assessed                                        Exercises Total Joint Exercises Ankle Circles/Pumps: AROM;Both;10 reps;Supine Quad Sets: AROM;Both;Supine;10 reps Heel Slides: AAROM;Right;10  reps;Supine Straight Leg Raises: AROM;Right;Supine;10 reps Goniometric ROM: grossly 8*--75*    General Comments        Pertinent Vitals/Pain Pain Assessment: 0-10 Pain Score: 3  Pain Location: R knee Pain Descriptors / Indicators: Sore Pain Intervention(s): Monitored during session    Home Living                      Prior Function            PT Goals (current goals can now be found in the care plan section) Acute Rehab PT Goals Patient Stated Goal: to walk PT Goal Formulation: With patient Time For Goal Achievement: 07/30/17 Potential to Achieve Goals: Good Progress towards PT goals: Progressing toward goals    Frequency    7X/week      PT Plan Current plan remains appropriate    Co-evaluation              AM-PAC PT "6 Clicks" Daily Activity  Outcome Measure  Difficulty turning over in bed (including adjusting bedclothes, sheets and blankets)?: A Little Difficulty moving from lying on back to sitting on the side of the bed? : A Little Difficulty sitting down on and standing up from a chair with arms (e.g., wheelchair, bedside commode, etc,.)?: A Little Help needed moving to and  from a bed to chair (including a wheelchair)?: A Little Help needed walking in hospital room?: A Little Help needed climbing 3-5 steps with a railing? : A Little 6 Click Score: 18    End of Session Equipment Utilized During Treatment: Gait belt Activity Tolerance: Patient tolerated treatment well Patient left: Other (comment)(with OT/bathroom) Nurse Communication: Mobility status PT Visit Diagnosis: Unsteadiness on feet (R26.81);Muscle weakness (generalized) (M62.81);Pain Pain - Right/Left: Right Pain - part of body: Knee     Time: 0677-0340 PT Time Calculation (min) (ACUTE ONLY): 24 min  Charges:  $Gait Training: 8-22 mins $Therapeutic Exercise: 8-22 mins                    G CodesKenyon Ana, PT Pager:  [phone] 07/24/2017    Kenyon Ana 07/24/2017, 11:19 AM

## 2017-07-24 NOTE — Care Management Obs Status (Signed)
[facility] NOTIFICATION   Patient Details  Name: [name] MRN: [id number] Date of Birth: Jan 24, 1951   Medicare Observation Status Notification Given:  Yes    Guadalupe Maple, RN 07/24/2017, 1:24 PM

## 2017-07-24 NOTE — Progress Notes (Signed)
   Subjective: 1 Day Post-Op Procedure(s) (LRB): RIGHT TOTAL KNEE ARTHROPLASTY (Right) Patient reports pain as mild.   Patient seen in rounds for Dr. Wynelle Link.  She feels good this morning and in good spirits.  She would like to consider going home this afternoon if feels good.  She walked 24 feet with therapy yesterday. Patient is well, but has had some minor complaints of pain in the knee, requiring pain medications We will start therapy today.  If they do well with therapy and meets all goals, then will allow home later this afternoon following therapy. Plan is to go Home after hospital stay.  Objective: Vital signs in last 24 hours: Temp:  [96.8 F (36 C)-98 F (36.7 C)] 97.4 F (36.3 C) (11/20 0527) Pulse Rate:  [66-98] 68 (11/20 0527) Resp:  [11-22] 14 (11/20 0527) BP: (100-127)/(58-81) 113/58 (11/20 0527) SpO2:  [90 %-100 %] 95 % (11/20 0527)  Intake/Output from previous day:  Intake/Output Summary (Last 24 hours) at 07/24/2017 0842 Last data filed at 07/24/2017 0600 Gross per 24 hour  Intake 4040 ml  Output 4685 ml  Net -645 ml    Intake/Output this shift: No intake/output data recorded.  Labs: Recent Labs    07/24/17 0602  HGB 9.6*   Recent Labs    07/24/17 0602  WBC 12.4*  RBC 4.18  HCT 31.5*  PLT 246   Recent Labs    07/24/17 0602  NA 138  K 4.7  CL 104  CO2 26  BUN 14  CREATININE 0.87  GLUCOSE 130*  CALCIUM 8.9   No results for input(s): LABPT, INR in the last 72 hours.  EXAM General - Patient is Alert, Appropriate and Oriented Extremity - Neurovascular intact Sensation intact distally Intact pulses distally Dorsiflexion/Plantar flexion intact Dressing - dressing C/D/I Motor Function - intact, moving foot and toes well on exam.  Hemovac pulled without difficulty.  Past Medical History:  Diagnosis Date  . Acid reflux   . Arthritis   . Asthma    LOV  6/13  Dr Annamaria Boots EPIC/ clearance on chart  from 10/12  . Depression   . Dysrhythmia     PVC's with caffeine and anxiety  . Heart murmur    since childhood  . Hiatal hernia   . High cholesterol   . Hypertension    OV with clearance and note Dr Addison Lank 6/13 chart  . Osteopenia   . Osteoporosis   . Sleep apnea    no CPAP/ last study 9 yrs ago- "mild per patient"  . STD (sexually transmitted disease)    HSV2  . Stye 06/2009   eye    Assessment/Plan: 1 Day Post-Op Procedure(s) (LRB): RIGHT TOTAL KNEE ARTHROPLASTY (Right) Active Problems:   OA (osteoarthritis) of knee  Estimated body mass index is 33.29 kg/m as calculated from the following:   Height as of this encounter: 5' 4.5" (1.638 m).   Weight as of this encounter: 89.4 kg (197 lb). Advance diet Up with therapy  DVT Prophylaxis - Xarelto Weight-Bearing as tolerated to right leg D/C O2 and Pulse OX and try on Room Air  If meets goals and able to go home:  Diet - Cardiac diet Follow up - in 2 weeks Activity - WBAT Disposition - Home Condition Upon Discharge - pending therapy D/C Meds - See DC Summary DVT Prophylaxis - [place], PA-C Orthopaedic Surgery

## 2017-07-24 NOTE — Discharge Summary (Signed)
Physician Discharge Summary   Patient ID: [name] MRN: [id number] DOB/AGE: 1951/03/23 66 y.o.  Admit date: 07/23/2017 Discharge date: 07/24/2017  Primary Diagnosis:  Osteoarthritis Right knee(s)   Admission Diagnoses:  Past Medical History:  Diagnosis Date  . Acid reflux   . Arthritis   . Asthma    LOV  6/13  Dr Annamaria Boots EPIC/ clearance on chart  from 10/12  . Depression   . Dysrhythmia    PVC's with caffeine and anxiety  . Heart murmur    since childhood  . Hiatal hernia   . High cholesterol   . Hypertension    OV with clearance and note Dr Addison Lank 6/13 chart  . Osteopenia   . Osteoporosis   . Sleep apnea    no CPAP/ last study 9 yrs ago- "mild per patient"  . STD (sexually transmitted disease)    HSV2  . Stye 06/2009   eye   Discharge Diagnoses:   Active Problems:   OA (osteoarthritis) of knee  Estimated body mass index is 33.29 kg/m as calculated from the following:   Height as of this encounter: 5' 4.5" (1.638 m).   Weight as of this encounter: 89.4 kg (197 lb).  Procedure:  Procedure(s) (LRB): RIGHT TOTAL KNEE ARTHROPLASTY (Right)   Consults: None  HPI: [name] is a 66 y.o. year old female with end stage OA of her right knee with progressively worsening pain and dysfunction. She has constant pain, with activity and at rest and significant functional deficits with difficulties even with ADLs. She has had extensive non-op management including analgesics, injections of cortisone and viscosupplements, and home exercise program, but remains in significant pain with significant dysfunction.Radiographs show bone on bone arthritis medial and patellofemoral. She presents now for right Total Knee Arthroplasty.     Laboratory Data: Admission on 07/23/2017  Component Date Value Ref Range Status  . WBC 07/24/2017 12.4* 4.0 - 10.5 K/uL Final  . RBC 07/24/2017 4.18  3.87 - 5.11 MIL/uL Final  . Hemoglobin 07/24/2017 9.6* 12.0 - 15.0 g/dL Final  . HCT  07/24/2017 31.5* 36.0 - 46.0 % Final  . MCV 07/24/2017 75.4* 78.0 - 100.0 fL Final  . MCH 07/24/2017 23.0* 26.0 - 34.0 pg Final  . MCHC 07/24/2017 30.5  30.0 - 36.0 g/dL Final  . RDW 07/24/2017 16.4* 11.5 - 15.5 % Final  . Platelets 07/24/2017 246  150 - 400 K/uL Final  . Sodium 07/24/2017 138  135 - 145 mmol/L Final  . Potassium 07/24/2017 4.7  3.5 - 5.1 mmol/L Final  . Chloride 07/24/2017 104  101 - 111 mmol/L Final  . CO2 07/24/2017 26  22 - 32 mmol/L Final  . Glucose, Bld 07/24/2017 130* 65 - 99 mg/dL Final  . BUN 07/24/2017 14  6 - 20 mg/dL Final  . Creatinine, Ser 07/24/2017 0.87  0.44 - 1.00 mg/dL Final  . Calcium 07/24/2017 8.9  8.9 - 10.3 mg/dL Final  . GFR calc non Af Amer 07/24/2017 >60  >60 mL/min Final  . GFR calc Af Amer 07/24/2017 >60  >60 mL/min Final   Comment: (NOTE) The eGFR has been calculated using the CKD EPI equation. This calculation has not been validated in all clinical situations. eGFR's persistently <60 mL/min signify possible Chronic Kidney Disease.   [name] gap 07/24/2017 8  5 - 15 Final  Hospital Outpatient Visit on 07/18/2017  Component Date Value Ref Range Status  . aPTT 07/18/2017 30  24 - 36 seconds  Final  . WBC 07/18/2017 5.1  4.0 - 10.5 K/uL Final  . RBC 07/18/2017 5.35* 3.87 - 5.11 MIL/uL Final  . Hemoglobin 07/18/2017 12.5  12.0 - 15.0 g/dL Final  . HCT 07/18/2017 40.5  36.0 - 46.0 % Final  . MCV 07/18/2017 75.7* 78.0 - 100.0 fL Final  . MCH 07/18/2017 23.4* 26.0 - 34.0 pg Final  . MCHC 07/18/2017 30.9  30.0 - 36.0 g/dL Final  . RDW 07/18/2017 16.5* 11.5 - 15.5 % Final  . Platelets 07/18/2017 296  150 - 400 K/uL Final  . Sodium 07/18/2017 137  135 - 145 mmol/L Final  . Potassium 07/18/2017 3.9  3.5 - 5.1 mmol/L Final  . Chloride 07/18/2017 101  101 - 111 mmol/L Final  . CO2 07/18/2017 26  22 - 32 mmol/L Final  . Glucose, Bld 07/18/2017 134* 65 - 99 mg/dL Final  . BUN 07/18/2017 11  6 - 20 mg/dL Final  . Creatinine, Ser 07/18/2017 0.94   0.44 - 1.00 mg/dL Final  . Calcium 07/18/2017 9.4  8.9 - 10.3 mg/dL Final  . Total Protein 07/18/2017 7.3  6.5 - 8.1 g/dL Final  . Albumin 07/18/2017 4.1  3.5 - 5.0 g/dL Final  . AST 07/18/2017 37  15 - 41 U/L Final  . ALT 07/18/2017 23  14 - 54 U/L Final  . Alkaline Phosphatase 07/18/2017 135* 38 - 126 U/L Final  . Total Bilirubin 07/18/2017 0.7  0.3 - 1.2 mg/dL Final  . GFR calc non Af Amer 07/18/2017 >60  >60 mL/min Final  . GFR calc Af Amer 07/18/2017 >60  >60 mL/min Final   Comment: (NOTE) The eGFR has been calculated using the CKD EPI equation. This calculation has not been validated in all clinical situations. eGFR's persistently <60 mL/min signify possible Chronic Kidney Disease.   . Anion gap 07/18/2017 10  5 - 15 Final  . Prothrombin Time 07/18/2017 12.6  11.4 - 15.2 seconds Final  . INR 07/18/2017 0.95   Final  . ABO/RH(D) 07/18/2017 O POS   Final  . Antibody Screen 07/18/2017 NEG   Final  . Sample Expiration 07/18/2017 07/26/2017   Final  . Extend sample reason 07/18/2017 NO TRANSFUSIONS OR PREGNANCY IN THE PAST 3 MONTHS   Final  . MRSA, PCR 07/18/2017 NEGATIVE  NEGATIVE Final  . Staphylococcus aureus 07/18/2017 NEGATIVE  NEGATIVE Final   Comment: (NOTE) The Xpert SA Assay (FDA approved for NASAL specimens in patients 64 years of age and older), is one component of a comprehensive surveillance program. It is not intended to diagnose infection nor to guide or monitor treatment.      X-Rays:No results found.  EKG: Orders placed or performed during the hospital encounter of 07/18/17  . EKG 12 lead  . EKG 12 lead     Hospital Course: [name] is a 66 y.o. who was admitted to [facility]. They were brought to the operating room on 07/23/2017 and underwent Procedure(s): RIGHT TOTAL KNEE ARTHROPLASTY.  Patient tolerated the procedure well and was later transferred to the recovery room and then to the orthopaedic floor for postoperative care.  They were  given PO and IV analgesics for pain control following their surgery.  They were given 24 hours of postoperative antibiotics of  Anti-infectives (From admission, onward)   Start     Dose/Rate Route Frequency Ordered Stop   07/23/17 1530  ceFAZolin (ANCEF) IVPB 2g/100 mL premix     2 g 200 mL/hr over  30 Minutes Intravenous Every 6 hours 07/23/17 1216 07/23/17 2054   07/23/17 0716  ceFAZolin (ANCEF) 2-4 GM/100ML-% IVPB    Comments:  Harvell, Gwendolyn  : cabinet override      07/23/17 0716 07/23/17 0920   07/23/17 0713  ceFAZolin (ANCEF) IVPB 2g/100 mL premix     2 g 200 mL/hr over 30 Minutes Intravenous On call to O.R. 07/23/17 [id number] 07/23/17 0935     and started on DVT prophylaxis in the form of Xarelto.   PT and OT were ordered for total joint protocol.  Discharge planning consulted to help with postop disposition and equipment needs.  Patient had a good night on the evening of surgery. She walked 24 feet with therapy .  They started to get up OOB with therapy on day one. Hemovac drain was pulled without difficulty.   Dressing was checked and was clean and dry.  Patient was seen in rounds on day one and was ready to go home.  Diet - Cardiac diet Follow up - in 2 weeks Activity - WBAT Disposition - Home Condition Upon Discharge - good D/C Meds - See DC Summary DVT Prophylaxis - Xarelto     Discharge Instructions    Call MD / Call 911   Complete by:  As directed    If you experience chest pain or shortness of breath, CALL 911 and be transported to the hospital emergency room.  If you develope a fever above 101 F, pus (white drainage) or increased drainage or redness at the wound, or calf pain, call your surgeon's office.   Change dressing   Complete by:  As directed    Change dressing daily with sterile 4 x 4 inch gauze dressing and apply TED hose. Do not submerge the incision under water.   Constipation Prevention   Complete by:  As directed    Drink plenty of fluids.  Prune juice  may be helpful.  You may use a stool softener, such as Colace (over the counter) 100 mg twice a day.  Use MiraLax (over the counter) for constipation as needed.   Diet - low sodium heart healthy   Complete by:  As directed    Discharge instructions   Complete by:  As directed    Take Xarelto for two and a half more weeks, then discontinue Xarelto. Once the patient has completed the blood thinner regimen, then take a Baby 81 mg Aspirin daily for three more weeks.   Pick up stool softner and laxative for home use following surgery while on pain medications. Do not submerge incision under water. Please use good hand washing techniques while changing dressing each day. May shower starting three days after surgery. Please use a clean towel to pat the incision dry following showers. Continue to use ice for pain and swelling after surgery. Do not use any lotions or creams on the incision until instructed by your surgeon.  Wear both TED hose on both legs during the day every day for three weeks, but may remove the TED hose at night at home.  Postoperative Constipation Protocol  Constipation - defined medically as fewer than three stools per week and severe constipation as less than one stool per week.  One of the most common issues patients have following surgery is constipation.  Even if you have a regular bowel pattern at home, your normal regimen is likely to be disrupted due to multiple reasons following surgery.  Combination of anesthesia, postoperative narcotics, change in appetite  and fluid intake all can affect your bowels.  In order to avoid complications following surgery, here are some recommendations in order to help you during your recovery period.  Colace (docusate) - Pick up an over-the-counter form of Colace or another stool softener and take twice a day as long as you are requiring postoperative pain medications.  Take with a full glass of water daily.  If you experience loose  stools or diarrhea, hold the colace until you stool forms back up.  If your symptoms do not get better within 1 week or if they get worse, check with your doctor.  Dulcolax (bisacodyl) - Pick up over-the-counter and take as directed by the product packaging as needed to assist with the movement of your bowels.  Take with a full glass of water.  Use this product as needed if not relieved by Colace only.   MiraLax (polyethylene glycol) - Pick up over-the-counter to have on hand.  MiraLax is a solution that will increase the amount of water in your bowels to assist with bowel movements.  Take as directed and can mix with a glass of water, juice, soda, coffee, or tea.  Take if you go more than two days without a movement. Do not use MiraLax more than once per day. Call your doctor if you are still constipated or irregular after using this medication for 7 days in a row.  If you continue to have problems with postoperative constipation, please contact the office for further assistance and recommendations.  If you experience "the worst abdominal pain ever" or develop nausea or vomiting, please contact the office immediatly for further recommendations for treatment.   Do not put a pillow under the knee. Place it under the heel.   Complete by:  As directed    Do not sit on low chairs, stoools or toilet seats, as it may be difficult to get up from low surfaces   Complete by:  As directed    Driving restrictions   Complete by:  As directed    No driving until released by the physician.   Increase activity slowly as tolerated   Complete by:  As directed    Lifting restrictions   Complete by:  As directed    No lifting until released by the physician.   Patient may shower   Complete by:  As directed    You may shower without a dressing once there is no drainage.  Do not wash over the wound.  If drainage remains, do not shower until drainage stops.   TED hose   Complete by:  As directed    Use stockings  (TED hose) for 3 weeks on both leg(s).  You may remove them at night for sleeping.   Weight bearing as tolerated   Complete by:  As directed    Laterality:  right   Extremity:  Lower     Allergies as of 07/24/2017      Reactions   Demerol [meperidine] Nausea And Vomiting   Meloxicam Other (See Comments)   Blood count went down   Nadolol Other (See Comments)   Low blood pressure      Medication List    STOP taking these medications   CALCIUM PO   cyclobenzaprine 10 MG tablet Commonly known as:  FLEXERIL   multivitamin tablet   PROBIOTIC DAILY PO   VITAMIN D PO     TAKE these medications   albuterol 108 (90 Base) MCG/ACT inhaler Commonly known  as:  PROVENTIL HFA;VENTOLIN HFA Inhale 2 puffs into the lungs every 4 (four) hours as needed for wheezing or shortness of breath.   amLODipine 5 MG tablet Commonly known as:  NORVASC Take 5 mg by mouth daily before breakfast.   atorvastatin 40 MG tablet Commonly known as:  LIPITOR Take 40 mg by mouth daily.   azelastine 0.1 % nasal spray Commonly known as:  ASTELIN USE 1-2 SPRAYS IN EACH NOSTRIL AT BEDTIME What changed:    how much to take  how to take this  when to take this  reasons to take this  additional instructions   BENEFIBER Powd Take 1 Scoop at bedtime by mouth.   cetirizine 10 MG tablet Commonly known as:  ZYRTEC Take 10 mg by mouth daily as needed. Allergies   cloNIDine 0.1 MG tablet Commonly known as:  CATAPRES Take 0.1 mg by mouth at bedtime.   docusate sodium 100 MG capsule Commonly known as:  COLACE Take 100 mg at bedtime by mouth.   esomeprazole 40 MG capsule Commonly known as:  NEXIUM Take 40 mg by mouth daily before breakfast.   Eszopiclone 3 MG Tabs Take 3 mg at bedtime by mouth. Take immediately before bedtime   Fluticasone-Salmeterol 100-50 MCG/DOSE Aepb Commonly known as:  ADVAIR DISKUS INHALE 1 PUFF BY MOUTH TWICE DAILY. RINSE MOUTH AFTER USE   magnesium gluconate 500 MG  tablet Commonly known as:  MAGONATE Take 500 mg at bedtime by mouth.   methocarbamol 500 MG tablet Commonly known as:  ROBAXIN Take 1 tablet (500 mg total) every 6 (six) hours as needed by mouth for muscle spasms.   mometasone 50 MCG/ACT nasal spray Commonly known as:  NASONEX USE 2 SPRAYS INTO EACH NOSTRIL EVERY DAY AS NEEDED FOR ALLERGIES   montelukast 10 MG tablet Commonly known as:  SINGULAIR TAKE 1 TABLET (10 MG TOTAL) BY MOUTH AT BEDTIME   oxyCODONE 5 MG immediate release tablet Commonly known as:  Oxy IR/ROXICODONE Take 1-2 tablets (5-10 mg total) every 4 (four) hours as needed by mouth for moderate pain or severe pain.   rivaroxaban 10 MG Tabs tablet Commonly known as:  XARELTO Take 1 tablet (10 mg total) daily with breakfast by mouth. Take Xarelto for two and a half more weeks following discharge from the hospital, then discontinue Xarelto. Once the patient has completed the blood thinner regimen, then take a Baby 81 mg Aspirin daily for three more weeks. Start taking on:  07/25/2017   traMADol 50 MG tablet Commonly known as:  ULTRAM Take 1-2 tablets (50-100 mg total) every 6 (six) hours as needed by mouth for moderate pain.   valACYclovir 500 MG tablet Commonly known as:  VALTREX TAKE 1 TABLET BY MOUTH EVERY DAY INCREASE TO TWICE A DAY FOR 3 DAYS WITH SYMPTOMS. What changed:    how much to take  how to take this  when to take this  additional instructions            Discharge Care Instructions  (From admission, onward)        Start     Ordered   07/24/17 0000  Weight bearing as tolerated    Question Answer Comment  Laterality right   Extremity Lower      07/24/17 0849   07/24/17 0000  Change dressing    Comments:  Change dressing daily with sterile 4 x 4 inch gauze dressing and apply TED hose. Do not submerge the incision under water.  07/24/17 0849     Follow-up Information    Gaynelle Arabian, MD. Schedule an appointment as soon as possible  for a visit on 08/07/2017.   Specialty:  Orthopedic Surgery Contact information: [address] [place] [address] [phone]           Signed: Arlee Muslim, PA-C Orthopaedic Surgery 07/24/2017, 8:50 AM

## 2017-07-24 NOTE — Progress Notes (Signed)
Physical Therapy Treatment Patient Details Name: [name] MRN: [id number] DOB: 07-Jan-1951 Today's Date: 07/24/2017    History of Present Illness R TKA    PT Comments    Pt progressing well; informed PT end of session that she wanted to D/C today (earlier stated she was staying);   Reviewed HEP with pt and husb  and answered questions as pt will not have therapy until Mond ay because OP is closed all week per pt report  Follow Up Recommendations  DC plan and follow up therapy as arranged by surgeon     Equipment Recommendations  None recommended by PT    Recommendations for Other Services       Precautions / Restrictions Precautions Precautions: Fall;Knee Precaution Comments: reviewed no pillow under knee Restrictions Weight Bearing Restrictions: No Other Position/Activity Restrictions: WBAT    Mobility  Bed Mobility Overal bed mobility: Needs Assistance Bed Mobility: Supine to Sit     Supine to sit: Supervision     General bed mobility comments: cues for technique  Transfers Overall transfer level: Needs assistance Equipment used: Rolling walker (2 wheeled) Transfers: Sit to/from Stand Sit to Stand: Supervision Stand pivot transfers: Supervision       General transfer comment: cues for hand placement and RLE  Ambulation/Gait Ambulation/Gait assistance: Min guard Ambulation Distance (Feet): 120 Feet Assistive device: Rolling walker (2 wheeled) Gait Pattern/deviations: Step-to pattern;Step-through pattern     General Gait Details: cues for  sequence, step length   Stairs            Wheelchair Mobility    Modified Rankin (Stroke Patients Only)       Balance Overall balance assessment: Needs assistance         Standing balance support: No upper extremity supported Standing balance-Leahy Scale: Fair                              Cognition Arousal/Alertness: Awake/alert Behavior During Therapy: WFL for tasks  assessed/performed Overall Cognitive Status: Within Functional Limits for tasks assessed                                        Exercises Total Joint Exercises Ankle Circles/Pumps: AROM;Both;10 reps;Supine Quad Sets: AROM;Both;Supine;10 reps Heel Slides: AAROM;Right;5 reps Straight Leg Raises: AROM;Strengthening;Right;5 reps    General Comments        Pertinent Vitals/Pain Pain Assessment: 0-10 Pain Score: 4  Pain Location: R knee Pain Descriptors / Indicators: Sore Pain Intervention(s): Limited activity within patient's tolerance;Monitored during session    Home Living Family/patient expects to be discharged to:: Private residence Living Arrangements: Spouse/significant other Available Help at Discharge: Family;Available 24 hours/day Type of Home: House Home Access: Stairs to enter Entrance Stairs-Rails: Right Home Layout: One level Home Equipment: Grab bars - tub/shower;Walker - 2 wheels;Cane - single point      Prior Function Level of Independence: Independent with assistive device(s)      Comments: used RW or SPC prn for RLE buckling   PT Goals (current goals can now be found in the care plan section) Acute Rehab PT Goals Patient Stated Goal: to walk PT Goal Formulation: With patient Time For Goal Achievement: 07/30/17 Potential to Achieve Goals: Good Progress towards PT goals: Progressing toward goals    Frequency    7X/week      PT Plan Current plan  remains appropriate    Co-evaluation              AM-PAC PT "6 Clicks" Daily Activity  Outcome Measure  Difficulty turning over in bed (including adjusting bedclothes, sheets and blankets)?: A Little Difficulty moving from lying on back to sitting on the side of the bed? : A Little Difficulty sitting down on and standing up from a chair with arms (e.g., wheelchair, bedside commode, etc,.)?: A Little Help needed moving to and from a bed to chair (including a wheelchair)?: A  Little Help needed walking in hospital room?: A Little Help needed climbing 3-5 steps with a railing? : A Little 6 Click Score: 18    End of Session Equipment Utilized During Treatment: Gait belt Activity Tolerance: Patient tolerated treatment well Patient left: in bed;with call bell/phone within reach;with family/visitor present Nurse Communication: Mobility status PT Visit Diagnosis: Unsteadiness on feet (R26.81);Muscle weakness (generalized) (M62.81);Pain Pain - Right/Left: Right Pain - part of body: Knee     Time: 3825-0539 PT Time Calculation (min) (ACUTE ONLY): 35 min  Charges:  $Gait Training: 8-22 mins $Therapeutic Exercise: 8-22 mins                    G Codes:          Ivry Pigue 07-25-2017, 3:25 PM

## 2017-07-24 NOTE — Care Management CC44 (Signed)
Condition Code 44 Documentation Completed  Patient Details  Name: [name] MRN: [id number] Date of Birth: 1950/11/03   Condition Code 44 given:  Yes Patient signature on Condition Code 44 notice:  Yes Documentation of 2 MD's agreement:  Yes Code 44 added to claim:  Yes    Guadalupe Maple, RN 07/24/2017, 1:24 PM

## 2017-07-24 NOTE — Progress Notes (Signed)
Discharge planning, no HH needs identified. Plan for OP PT, has DME. [phone] 

## 2017-07-24 NOTE — Progress Notes (Signed)
Physical Therapy Treatment Patient Details Name: [name] MRN: [id number] DOB: 06-09-1951 Today's Date: 07/24/2017    History of Present Illness R TKA    PT Comments    The patient  Practiced Steps with [name] and [name]. Plans DC today.    Follow Up Recommendations  DC plan and follow up therapy as arranged by surgeon     Equipment Recommendations  None recommended by PT    Recommendations for Other Services       Precautions / Restrictions Precautions Precautions: Fall;Knee Precaution Comments: reviewed no pillow under knee Restrictions Weight Bearing Restrictions: No Other Position/Activity Restrictions: WBAT    Mobility  Bed Mobility Overal bed mobility: Needs Assistance Bed Mobility: Supine to Sit     Supine to sit: Supervision     General bed mobility comments: cues for technique  Transfers Overall transfer level: Needs assistance Equipment used: Rolling walker (2 wheeled) Transfers: Sit to/from Stand Sit to Stand: Supervision         General transfer comment: cues for hand placement and RLE  Ambulation/Gait Ambulation/Gait assistance: Min guard Ambulation Distance (Feet): 40 Feet Assistive device: Rolling walker (2 wheeled) Gait Pattern/deviations: Step-to pattern;Step-through pattern     General Gait Details: cues for  sequence, step length   Stairs Stairs: Yes   Stair Management: Step to pattern;One rail Left;Forwards;With cane Number of Stairs: 3 General stair comments: practiced 2 backwards with [name].  Wheelchair Mobility    Modified Rankin (Stroke Patients Only)       Balance Overall balance assessment: Needs assistance         Standing balance support: No upper extremity supported Standing balance-Leahy Scale: Fair                              Cognition Arousal/Alertness: Awake/alert Behavior During Therapy: WFL for tasks assessed/performed Overall Cognitive Status: Within Functional Limits for tasks  assessed                                        Exercises Total Joint Exercises Ankle Circles/Pumps: AROM;Both;10 reps;Supine Quad Sets: AROM;Both;Supine;10 reps Heel Slides: AAROM;Right;5 reps Straight Leg Raises: AROM;Strengthening;Right;5 reps    General Comments        Pertinent Vitals/Pain Pain Assessment: 0-10 Pain Score: 3  Pain Location: R knee Pain Descriptors / Indicators: Sore Pain Intervention(s): Monitored during session;Premedicated before session    Home Living                      Prior Function            PT Goals (current goals can now be found in the care plan section) Acute Rehab PT Goals Patient Stated Goal: to walk PT Goal Formulation: With patient Time For Goal Achievement: 07/30/17 Potential to Achieve Goals: Good Progress towards PT goals: Progressing toward goals    Frequency    7X/week      PT Plan Current plan remains appropriate    Co-evaluation              AM-PAC PT "6 Clicks" Daily Activity  Outcome Measure  Difficulty turning over in bed (including adjusting bedclothes, sheets and blankets)?: A Little Difficulty moving from lying on back to sitting on the side of the bed? : A Little Difficulty sitting down on and standing up from a  chair with arms (e.g., wheelchair, bedside commode, etc,.)?: A Little Help needed moving to and from a bed to chair (including a wheelchair)?: A Little Help needed walking in hospital room?: A Little Help needed climbing 3-5 steps with a railing? : A Little 6 Click Score: 18    End of Session Equipment Utilized During Treatment: Gait belt Activity Tolerance: Patient tolerated treatment well Patient left: in bed;with call bell/phone within reach Nurse Communication: Mobility status PT Visit Diagnosis: Unsteadiness on feet (R26.81);Muscle weakness (generalized) (M62.81);Pain Pain - Right/Left: Right Pain - part of body: Knee     Time: 0786-7544 PT Time  Calculation (min) (ACUTE ONLY): 18 min  Charges:  $Gait Training: 8-22 mins $                   G CodesTresa Endo PT [phone]    [name] 07/24/2017, 4:32 PM

## 2017-08-14 NOTE — Progress Notes (Signed)
07/24/17 1100  PT Visit Information  Last PT Received On 07/24/17  Assistance Needed +1  History of Present Illness R TKA  Precautions  Precautions Fall;Knee  Precaution Comments reviewed no pillow under knee  Restrictions  Weight Bearing Restrictions No  Other Position/Activity Restrictions WBAT  Pain Assessment  Pain Assessment 0-10  Pain Score 3  Pain Location R knee  Pain Descriptors / Indicators Sore  Pain Intervention(s) Monitored during session  Cognition  Arousal/Alertness Awake/alert  Behavior During Therapy WFL for tasks assessed/performed  Overall Cognitive Status Within Functional Limits for tasks assessed  Bed Mobility  Overal bed mobility Needs Assistance  Bed Mobility Supine to Sit  Supine to sit Supervision  General bed mobility comments for safety  Transfers  Overall transfer level Needs assistance  Equipment used Rolling walker (2 wheeled)  Transfers Sit to/from Stand  Sit to Stand Min guard  General transfer comment cues for hand placement and RLE  Ambulation/Gait  Ambulation/Gait assistance Min guard  Ambulation Distance (Feet) 80 Feet  Assistive device Rolling walker (2 wheeled)  Gait Pattern/deviations Step-to pattern;Step-through pattern  General Gait Details cues for  sequence, step length  Total Joint Exercises  Ankle Circles/Pumps AROM;Both;10 reps;Supine  Quad Sets AROM;Both;Supine;10 reps  Heel Slides AAROM;Right;10 reps;Supine  Straight Leg Raises AROM;Right;Supine;10 reps  Goniometric ROM grossly 8*--75*  PT - End of Session  Equipment Utilized During Treatment Gait belt  Activity Tolerance Patient tolerated treatment well  Patient left Other (comment) (with OT/bathroom)  Nurse Communication Mobility status  PT Assessment  PT Visit Diagnosis Unsteadiness on feet (R26.81);Muscle weakness (generalized) (M62.81);Pain  Pain - Right/Left Right  Pain - part of body Knee  PT Plan  PT Frequency (ACUTE ONLY) 7X/week  AM-PAC PT "6 Clicks"  Daily Activity Outcome Measure  Difficulty turning over in bed (including adjusting bedclothes, sheets and blankets)? 3  Difficulty moving from lying on back to sitting on the side of the bed?  3  Difficulty sitting down on and standing up from a chair with arms (e.g., wheelchair, bedside commode, etc,.)? 3  Help needed moving to and from a bed to chair (including a wheelchair)? 3  Help needed walking in hospital room? 3  Help needed climbing 3-5 steps with a railing?  3  6 Click Score 18  Mobility G Code  CK  PT Recommendation  Follow Up Recommendations DC plan and follow up therapy as arranged by surgeon  PT equipment None recommended by PT  Acute Rehab PT Goals  Patient Stated Goal to walk  PT Goal Formulation With patient  Time For Goal Achievement 07/30/17  Potential to Achieve Goals Good  PT Time Calculation  PT Start Time (ACUTE ONLY) 1048  PT Stop Time (ACUTE ONLY) 1112  PT Time Calculation (min) (ACUTE ONLY) 24 min  PT G-Codes **NOT FOR INPATIENT CLASS**  Functional Assessment Tool Used AM-PAC 6 Clicks Basic Mobility  Functional Limitation Mobility: Walking and moving around  Mobility: Walking and Moving Around Current Status (X3244) CK  Mobility: Walking and Moving Around Goal Status (W1027) CJ  PT General Charges  $$ ACUTE PT VISIT 1 Visit  PT Treatments  $Gait Training 8-22 mins  $Therapeutic Exercise 8-22 mins  Blondell Reveal Kistler PT 08/14/2017  [phone]

## 2017-08-24 NOTE — Progress Notes (Addendum)
   07/24/17 1142  OT Time Calculation  OT Start Time (ACUTE ONLY) 1110  OT Stop Time (ACUTE ONLY) 1124  OT Time Calculation (min) 14 min  OT G-codes **NOT FOR INPATIENT CLASS**  Functional Assessment Tool Used Clinical judgement  Functional Limitation Self care  Self Care Current Status (Y2482) CI  Self Care Goal Status (N0037) CI  Self Care Discharge Status (C4888) CI  OT General Charges  $OT Visit 1 Visit  OT Evaluation  $OT Eval Low Complexity 1 Low  entered for [facility], based on clinical judgment from note: [place], [place] [phone] 08/24/2017

## 2017-09-13 ENCOUNTER — Ambulatory Visit: Payer: Medicare Other | Admitting: Internal Medicine

## 2017-11-06 ENCOUNTER — Telehealth: Payer: Self-pay | Admitting: Internal Medicine

## 2017-11-06 MED ORDER — FLUTICASONE PROPIONATE 50 MCG/ACT NA SUSP: 2.0000 | Freq: Every day | NASAL | 2 refills | Status: DC

## 2017-11-06 NOTE — Telephone Encounter (Signed)
Pt states Nasonex is not covered by insurance.  Covered alternatives include Flonase, Desloratadine, Levocetirizine, Flunisoilde or Azelastine.   CY please advise. Thanks  Current Outpatient Medications on File Prior to Visit  Medication Sig Dispense Refill  . albuterol (PROVENTIL HFA;VENTOLIN HFA) 108 (90 Base) MCG/ACT inhaler Inhale 2 puffs into the lungs every 4 (four) hours as needed for wheezing or shortness of breath. 1 Inhaler 12  . amLODipine (NORVASC) 5 MG tablet Take 5 mg by mouth daily before breakfast.     . atorvastatin (LIPITOR) 40 MG tablet Take 40 mg by mouth daily.    Marland Kitchen azelastine (ASTELIN) 0.1 % nasal spray USE 1-2 SPRAYS IN EACH NOSTRIL AT BEDTIME (Patient taking differently: Place 1-2 sprays at bedtime as needed into both nostrils for rhinitis. USE 1-2 SPRAYS IN EACH NOSTRIL AT BEDTIME AS NEEDED) 30 mL 12  . cetirizine (ZYRTEC) 10 MG tablet Take 10 mg by mouth daily as needed. Allergies    . cloNIDine (CATAPRES) 0.1 MG tablet Take 0.1 mg by mouth at bedtime.    . docusate sodium (COLACE) 100 MG capsule Take 100 mg at bedtime by mouth.    . esomeprazole (NEXIUM) 40 MG capsule Take 40 mg by mouth daily before breakfast.     . Eszopiclone 3 MG TABS Take 3 mg at bedtime by mouth. Take immediately before bedtime     . Fluticasone-Salmeterol (ADVAIR DISKUS) 100-50 MCG/DOSE AEPB INHALE 1 PUFF BY MOUTH TWICE DAILY. RINSE MOUTH AFTER USE 180 each 12  . magnesium gluconate (MAGONATE) 500 MG tablet Take 500 mg at bedtime by mouth.     . methocarbamol (ROBAXIN) 500 MG tablet Take 1 tablet (500 mg total) every 6 (six) hours as needed by mouth for muscle spasms. 80 tablet 0  . mometasone (NASONEX) 50 MCG/ACT nasal spray USE 2 SPRAYS INTO EACH NOSTRIL EVERY DAY AS NEEDED FOR ALLERGIES 17 g 12  . montelukast (SINGULAIR) 10 MG tablet TAKE 1 TABLET (10 MG TOTAL) BY MOUTH AT BEDTIME 90 tablet 3  . ondansetron (ZOFRAN) 4 MG tablet Take 1 tablet (4 mg total) by mouth every 6 (six) hours as needed  for nausea. 30 tablet 0  . oxyCODONE (OXY IR/ROXICODONE) 5 MG immediate release tablet Take 1-2 tablets (5-10 mg total) every 4 (four) hours as needed by mouth for moderate pain or severe pain. 80 tablet 0  . rivaroxaban (XARELTO) 10 MG TABS tablet Take 1 tablet (10 mg total) daily with breakfast by mouth. Take Xarelto for two and a half more weeks following discharge from the hospital, then discontinue Xarelto. Once the patient has completed the blood thinner regimen, then take a Baby 81 mg Aspirin daily for three more weeks. 20 tablet 0  . traMADol (ULTRAM) 50 MG tablet Take 1-2 tablets (50-100 mg total) every 6 (six) hours as needed by mouth for moderate pain. 56 tablet 0  . valACYclovir (VALTREX) 500 MG tablet TAKE 1 TABLET BY MOUTH EVERY DAY INCREASE TO TWICE A DAY FOR 3 DAYS WITH SYMPTOMS. (Patient taking differently: Take 500 mg See admin instructions by mouth. TAKE 1 TABLET BY MOUTH EVERY DAY INCREASE TO TWICE A DAY FOR 3 DAYS WITH SYMPTOMS AS NEEDED) 45 tablet 13  . Wheat Dextrin (BENEFIBER) POWD Take 1 Scoop at bedtime by mouth.     No current facility-administered medications on file prior to visit.     Allergies  Allergen Reactions  . Demerol [Meperidine] Nausea And Vomiting  . Meloxicam Other (See Comments)  Blood count went down   . Nadolol Other (See Comments)    Low blood pressure

## 2017-11-06 NOTE — Telephone Encounter (Signed)
Spoke with pt, advised medication change. Rx sent to the pharmacy. Nothing further is needed.

## 2017-11-06 NOTE — Telephone Encounter (Signed)
Suggest change script from Nasonex to [facility]

## 2017-11-06 NOTE — Telephone Encounter (Signed)
Pt would like medication to be sent to [facility] on [address] [facility]

## 2017-12-01 ENCOUNTER — Other Ambulatory Visit: Payer: Self-pay | Admitting: Internal Medicine

## 2017-12-11 ENCOUNTER — Other Ambulatory Visit: Payer: Self-pay | Admitting: Internal Medicine

## 2017-12-11 MED ORDER — FLUTICASONE PROPIONATE 50 MCG/ACT NA SUSP: NASAL | 3 refills | Status: DC

## 2017-12-11 NOTE — Telephone Encounter (Signed)
Per CY-okay to send Rx for 90 day supply plus 3 additional refills.

## 2018-01-01 ENCOUNTER — Ambulatory Visit: Payer: Medicare Other | Admitting: Internal Medicine

## 2018-01-01 ENCOUNTER — Encounter: Payer: Self-pay | Admitting: Internal Medicine

## 2018-01-01 DIAGNOSIS — G4733 Obstructive sleep apnea (adult) (pediatric): Secondary | ICD-10-CM

## 2018-01-01 DIAGNOSIS — J3089 Other allergic rhinitis: Secondary | ICD-10-CM

## 2018-01-01 DIAGNOSIS — J4530 Mild persistent asthma, uncomplicated: Secondary | ICD-10-CM

## 2018-01-01 DIAGNOSIS — J302 Other seasonal allergic rhinitis: Secondary | ICD-10-CM | POA: Diagnosis not present

## 2018-01-01 NOTE — Assessment & Plan Note (Signed)
She will ask her husband to comment on snoring and witnessed apneas.  She is not noticing significant daytime sleepiness and after her substantial weight loss, this problem may likely be resolved.

## 2018-01-01 NOTE — Assessment & Plan Note (Signed)
Very mild intermittent uncomplicated now, not not needing rescue inhaler more than occasionally.

## 2018-01-01 NOTE — Patient Instructions (Signed)
Glad you are doing well.  Please call for refills and as needed

## 2018-01-01 NOTE — Progress Notes (Signed)
Patient ID: [name], female    DOB: 1951-02-13, 67 y.o.   MRN: [id number]  HPI  F never smoker folowed for asthma, allergic rhinitis, complicated by hx OSA/ oral appliance, HBP, GERD PFT 05/30/10- FEV1 2.56/ 91%, FEV1/FVC 0.73, DLCO 84%  within normal limits. NPSG-04/21/14- Moderate obstructive sleep apnea, AHI 21/ hr, CPAP to 14, weight 185 lbs  ----------------------------------------------------------------- . 07/03/17- 67 year old female never smoker followed for asthma, allergic rhinitis, OSA/oral appliance/Dr. Ron Parker, complicated by HBP, GERD OSA-Pt was not able to tolerate CPAP or oral appliance. Pt is wanting to get something for sleep but wants to wait til after knee replacement.  Pt needs clearance for this,  Singulair, Advair 100-50, albuterol HFA Pending TKR right knee. Had previous bilateral hip and left TKR. Asthma control has been quite good continuing Advair 100 and Singulair. Rarely needs rescue inhaler. No acute events or concerns that would affect surgery plans. Denies chest pain or cough. Has incidental head cold today-discussed supportive care. Failed to tolerate either CPAP or oral appliance. Dr. Terence Lux has told her she may need ENT surgery for chronic obstruction with history of septal deviation. I outlined pending availability of Inspire nerve stimulator for OSA.  She continues to use Lunesta when needed.  01/01/2018- 67 year old female never smoker followed for asthma, allergic rhinitis, OSA-failedCPAP/ oral appliance, complicated by HBP, GERD Singulair, Advair 100-50, albuterol HFA, Lunesta 6 month follow up for asthma. States her breathing has been ok since the last visit.  Has lost 40 lbs since knee surgery through sustained diet and exercise.  She says she feels very well. She is not being told now that she snores and she denies daytime sleepiness.  She has been told in the past that she had a significant nasal septal deviation that might contribute to  snoring and sleep apnea.  She can return to ENT evaluation if needed. Seasonal allergies and asthma have been very well controlled.  Only taking occasional Zyrtec.  Rare respiratory infection in the past year.  Infrequent need for rescue inhaler.  Review of Systems- see HPI  + = positive Constitutional:   +  weight loss- see HPI, night sweats, fevers, chills, +fatigue, lassitude. HEENT:   +  headaches, difficulty swallowing, tooth/dental problems, sore throat,       No-  sneezing, itching, ear ache, +nasal congestion, post nasal drip,  CV:  No-   chest pain, orthopnea, PND, swelling in lower extremities, anasarca,  dizziness, palpitations Resp: No-   shortness of breath with exertion or at rest.              No-   productive cough,  No non-productive cough,  No- coughing up of blood.              No-   change in color of mucus.  No- wheezing.   Skin: No-   rash or lesions. GI:  +heartburn, indigestion, abdominal pain, nausea, vomiting, GU: . MS:  See HPI-  joint pain or swelling.   Neuro-     nothing unusual Psych:  No- change in mood or affect. No depression or anxiety.  No memory loss.  OBJ General- Alert, Oriented, Affect-appropriate, Distress- none acute. + Overweight Skin- rash-none, lesions- none, excoriation- none Lymphadenopathy- none Head- atraumatic            Eyes- Gross vision intact, PERRLA, conjunctivae + lightly injected right with clear secretion            Ears- Hearing, canals-normal  Nose- + actually looks more open on the left, mucus+, no-polyps, erosion, perforation + blowing nose            Throat- Mallampati III , mucosa clear , drainage- none, tonsils- atrophic, own teeth Neck- flexible , trachea midline, no stridor , thyroid nl, carotid no bruit Chest - symmetrical excursion , unlabored           Heart/CV- RRR , no murmur , no gallop  , no rub, nl s1 s2                           - JVD- none , edema- none, stasis changes- none, varices- none            Lung-  wheeze-none, cough- none , dullness-none, rub- none           Chest wall-  Abd-  Br/ Gen/ Rectal- Not done, not indicated Extrem- cyanosis- none, clubbing, none, atrophy- none, strength- nl Neuro- grossly intact to observation

## 2018-01-01 NOTE — Assessment & Plan Note (Signed)
Seasonal pollen rhinitis so far as well controlled with only occasional use of Zyrtec.  She can add Flonase if needed.

## 2018-05-17 ENCOUNTER — Other Ambulatory Visit: Payer: Self-pay

## 2018-05-17 ENCOUNTER — Encounter: Payer: Self-pay | Admitting: Obstetrics & Gynecology

## 2018-05-17 ENCOUNTER — Ambulatory Visit (INDEPENDENT_AMBULATORY_CARE_PROVIDER_SITE_OTHER): Payer: Medicare Other | Admitting: Obstetrics & Gynecology

## 2018-05-17 VITALS — BP 128/70 | HR 64 | Resp 16 | Ht 64.0 in | Wt 183.2 lb

## 2018-05-17 DIAGNOSIS — Z1211 Encounter for screening for malignant neoplasm of colon: Secondary | ICD-10-CM | POA: Diagnosis not present

## 2018-05-17 DIAGNOSIS — Z01419 Encounter for gynecological examination (general) (routine) without abnormal findings: Secondary | ICD-10-CM

## 2018-05-17 DIAGNOSIS — Z124 Encounter for screening for malignant neoplasm of cervix: Secondary | ICD-10-CM

## 2018-05-17 MED ORDER — VALACYCLOVIR HCL 500 MG PO TABS: 500.0000 mg | ORAL_TABLET | ORAL | 12 refills | Status: DC

## 2018-05-17 NOTE — Progress Notes (Addendum)
67 y.o. G1P1 Married White or Caucasian female here for annual exam.  Doing well.  Had right knee replacement in November with Dr. Maureen Ralphs.  Seeing Dr. Annamaria Boots.  Has used CPAP and mouth apparatus.  Has been recommended to have sinus surgery.  About 75% blocked on the left.  Seeing ENT next week.  Has been told the CPAP or a mouth guard until the sinus surgery is done.  Doesn't really want to have another surgery as she's had both knees and both hips replaced since 2007.  Denies vaginal bleeding.    PCP:  Dr. Addison Lank.  Will see her again in October.    Patient's last menstrual period was 09/05/2003.          Sexually active: No.  The current method of family planning is post menopausal status.    Exercising: Yes.    walk, gym, bike Smoker:  no  Health Maintenance: Pap:  01/24/16 Neg. HR HPV:neg   01/21/15 neg. HR HPV:neg  History of abnormal Pap:  Yes, LGSIL MMG:  05/15/18, normal Colonoscopy:  03/11/14 polyp. F/u 3 years  BMD:   08/19/14 Osteoporosis.  On prolia.  Followed by Dr. Chalmers Cater.   TDaP:  2019 Pneumonia vaccine(s):  done Shingrix:   Completed  Hep C testing: 08/20/13 Neg  Screening Labs: PCP   reports that she has never smoked. She has never used smokeless tobacco. She reports that she drinks alcohol. She reports that she does not use drugs.  Past Medical History:  Diagnosis Date  . Acid reflux   . Arthritis   . Asthma    LOV  6/13  Dr Annamaria Boots EPIC/ clearance on chart  from 10/12  . Depression   . Dysrhythmia    PVC's with caffeine and anxiety  . Heart murmur    since childhood  . Hiatal hernia   . High cholesterol   . Hypertension    OV with clearance and note Dr Addison Lank 6/13 chart  . Osteopenia   . Osteoporosis   . Sleep apnea    no CPAP/ last study 9 yrs ago- "mild per patient"  . STD (sexually transmitted disease)    HSV2  . Stye 06/2009   eye    Past Surgical History:  Procedure Laterality Date  . bilateral hip replacement Bilateral 6/07, 9/07   right, then  left  . BREAST REDUCTION SURGERY    . CESAREAN SECTION    . FRACTURE SURGERY     Left leg-1977  . NASAL SEPTUM SURGERY    . TOTAL KNEE ARTHROPLASTY  03/18/2012   Procedure: TOTAL KNEE ARTHROPLASTY;  Surgeon: Gearlean Alf, MD;  Location: [facility];  Service: Orthopedics;  Laterality: Left;  . TOTAL KNEE ARTHROPLASTY Right 07/23/2017   Procedure: RIGHT TOTAL KNEE ARTHROPLASTY;  Surgeon: Gaynelle Arabian, MD;  Location: [facility];  Service: Orthopedics;  Laterality: Right;    Current Outpatient Medications  Medication Sig Dispense Refill  . albuterol (PROVENTIL HFA;VENTOLIN HFA) 108 (90 Base) MCG/ACT inhaler Inhale 2 puffs into the lungs every 4 (four) hours as needed for wheezing or shortness of breath. 1 Inhaler 12  . amLODipine (NORVASC) 5 MG tablet Take 5 mg by mouth daily before breakfast.     . atorvastatin (LIPITOR) 40 MG tablet Take 40 mg by mouth daily.    Marland Kitchen azelastine (ASTELIN) 0.1 % nasal spray USE 1-2 SPRAYS IN EACH NOSTRIL AT BEDTIME (Patient taking differently: Place 1-2 sprays at bedtime as needed into both nostrils for rhinitis. USE  1-2 SPRAYS IN EACH NOSTRIL AT BEDTIME AS NEEDED) 30 mL 12  . Calcium Carbonate-Vit D-Min (CALCIUM 1200 PO) Take by mouth daily.    . cetirizine (ZYRTEC) 10 MG tablet Take 10 mg by mouth daily as needed. Allergies    . cloNIDine (CATAPRES) 0.1 MG tablet Take 0.1 mg by mouth at bedtime.    . docusate sodium (COLACE) 100 MG capsule Take 100 mg at bedtime by mouth.    . esomeprazole (NEXIUM) 40 MG capsule Take 40 mg by mouth daily before breakfast.     . Eszopiclone 3 MG TABS Take 3 mg at bedtime by mouth. Take immediately before bedtime     . fluticasone (FLONASE) 50 MCG/ACT nasal spray SPRAY 2 SPRAYS INTO EACH NOSTRIL EVERY DAY 48 g 3  . Fluticasone-Salmeterol (ADVAIR DISKUS) 100-50 MCG/DOSE AEPB INHALE 1 PUFF BY MOUTH TWICE DAILY. RINSE MOUTH AFTER USE 180 each 12  . magnesium gluconate (MAGONATE) 500 MG tablet Take 500 mg at bedtime by mouth.     .  methocarbamol (ROBAXIN) 500 MG tablet Take 1 tablet (500 mg total) every 6 (six) hours as needed by mouth for muscle spasms. 80 tablet 0  . mometasone (NASONEX) 50 MCG/ACT nasal spray USE 2 SPRAYS INTO EACH NOSTRIL EVERY DAY AS NEEDED FOR ALLERGIES 17 g 12  . montelukast (SINGULAIR) 10 MG tablet TAKE 1 TABLET BY MOUTH AT BEDTIME 90 tablet 1  . Multiple Vitamin (MULTI-VITAMINS) TABS Take by mouth daily.    . valACYclovir (VALTREX) 500 MG tablet TAKE 1 TABLET BY MOUTH EVERY DAY INCREASE TO TWICE A DAY FOR 3 DAYS WITH SYMPTOMS. (Patient taking differently: Take 500 mg See admin instructions by mouth. TAKE 1 TABLET BY MOUTH EVERY DAY INCREASE TO TWICE A DAY FOR 3 DAYS WITH SYMPTOMS AS NEEDED) 45 tablet 13  . Wheat Dextrin (BENEFIBER) POWD Take 1 Scoop at bedtime by mouth.     No current facility-administered medications for this visit.     Family History  Problem Relation Age of Onset  . Heart disease Father   . Cancer Father   . Hypertension Father   . Kidney Stones Father   . Arthritis Mother   . Hypertension Mother   . Pancreatic cancer Unknown   . Coronary artery disease Other   . Cancer Other   . Asthma Other   . Nephritis Daughter        glomerular    Review of Systems  All other systems reviewed and are negative.   Exam:   BP 128/70 (BP Location: Right Arm, Patient Position: Sitting, Cuff Size: Large)   Pulse 64   Resp 16   Ht 5\' 4"  (1.626 m)   Wt 183 lb 3.2 oz (83.1 kg)   LMP 09/05/2003   BMI 31.45 kg/m   Height: 5\' 4"  (162.6 cm)  Ht Readings from Last 3 Encounters:  05/17/18 5\' 4"  (1.626 m)  01/01/18 5' 4.5" (1.638 m)  07/23/17 5' 4.5" (1.638 m)    General appearance: alert, cooperative and appears stated age Head: Normocephalic, without obvious abnormality, atraumatic Neck: no adenopathy, supple, symmetrical, trachea midline and thyroid normal to inspection and palpation Lungs: clear to auscultation bilaterally Breasts: normal appearance, no masses or  tenderness Heart: regular rate and rhythm Abdomen: soft, non-tender; bowel sounds normal; no masses,  no organomegaly Extremities: extremities normal, atraumatic, no cyanosis or edema Skin: Skin color, texture, turgor normal. No rashes or lesions Lymph nodes: Cervical, supraclavicular, and axillary nodes normal. No abnormal inguinal nodes  palpated Neurologic: Grossly normal   Pelvic: External genitalia:  no lesions              Urethra:  normal appearing urethra with no masses, tenderness or lesions              Bartholins and Skenes: normal                 Vagina: normal appearing vagina with normal color and discharge, no lesions              Cervix: no lesions              Pap taken: Yes.   Bimanual Exam:  Uterus:  normal size, contour, position, consistency, mobility, non-tender              Adnexa: normal adnexa and no mass, fullness, tenderness               Rectovaginal: Confirms               Anus:  normal sphincter tone, no lesions  Chaperone was present for exam.  A:  Well Woman with normal exam PMP, no HRT Osteoporosis.  Completed two year of Forteo and now on Prolia.  Followed by Dr. Chalmers Cater. H/O elevated lipids Hypertension Asthma H/O LGSIL with CIN 1 2015.  Neg pap with HR HPV 2016, 2017 HSV  P:   Mammogram guidelines reviewed.  Just did this two days ago. pap smear and HR HPV will be obtained next year. RF for valtrex 500mg  daily and increase to BID x 3 days.  #45/12RF Blood work done with Dr. Addison Lank and has appt in about a month Dr. Chalmers Cater follows BMD Colonoscopy report recommended 3 years.  She's called last year and was told it was 10 years.  Will communicate with Dr. Lorie Apley office about this.  (Addendum:  Dr. Collene Mares recommended 5 year follow up and appt has been made for 6/20.) Return annually or prn

## 2018-05-21 ENCOUNTER — Encounter: Payer: Self-pay | Admitting: Obstetrics & Gynecology

## 2018-05-25 ENCOUNTER — Other Ambulatory Visit: Payer: Self-pay | Admitting: Internal Medicine

## 2018-06-14 ENCOUNTER — Ambulatory Visit
Admission: RE | Admit: 2018-06-14 | Discharge: 2018-06-14 | Disposition: A | Discharge status: Home / Self Care | Payer: Medicare Other | Source: Ambulatory Visit | Attending: Family Medicine | Admitting: Family Medicine

## 2018-06-14 ENCOUNTER — Other Ambulatory Visit: Payer: Self-pay | Admitting: Family Medicine

## 2018-06-14 DIAGNOSIS — R1030 Lower abdominal pain, unspecified: Secondary | ICD-10-CM

## 2018-06-14 MED ORDER — IOPAMIDOL (ISOVUE-300) INJECTION 61%
100.0000 mL | Freq: Once | INTRAVENOUS | Status: AC | PRN
  Administered 2018-06-14: 100 mL via INTRAVENOUS

## 2018-07-09 ENCOUNTER — Other Ambulatory Visit: Payer: Self-pay | Admitting: Otolaryngology

## 2018-07-11 ENCOUNTER — Inpatient Hospital Stay (HOSPITAL_COMMUNITY): Admission: RE | Admit: 2018-07-11 | Payer: Medicare Other | Source: Ambulatory Visit

## 2018-07-12 NOTE — Pre-Procedure Instructions (Signed)
[name]  07/12/2018      [id number] - [place] [address], [place] - [address] [address] Phone: [phone] Fax: [phone]    Your procedure is scheduled on Fri., Nov. 15, 2019 from 7:30AM-8:28AM  Report to [facility] Admitting Entrance "A" at 5:30AM  Call this number if you have problems the morning of surgery:  [phone]   Remember:  Do not eat or drink after midnight on Nov. 14th    Take these medicines the morning of surgery with A SIP OF WATER: AmLODipine (NORVASC), Esomeprazole (NEXIUM), Fluticasone-Salmeterol (ADVAIR DISKUS), and ValACYclovir (VALTREX)  If needed: Methocarbamol (ROBAXIN), Fluticasone (FLONASE), and Cetirizine (ZYRTEC)    As of today, stop taking all Other Aspirin Products, Vitamins, Fish oils, and Herbal medications. Also stop all NSAIDS i.e. Advil, Ibuprofen, Motrin, Aleve, Anaprox, Naproxen, BC, Goody Powders, and all Supplements.   Do not wear jewelry, make-up or nail polish.  Do not wear lotions, powders, or perfumes, or deodorant.  Do not shave 48 hours prior to surgery.    Do not bring valuables to the hospital.  [facility] is not responsible for any belongings or valuables.  Contacts, dentures or bridgework may not be worn into surgery.  Leave your suitcase in the car.  After surgery it may be brought to your room.  For patients admitted to the hospital, discharge time will be determined by your treatment team.  Patients discharged the day of surgery will not be allowed to drive home.   Special instructions:   [facility]- Preparing For Surgery  Before surgery, you can play an important role. Because skin is not sterile, your skin needs to be as free of germs as possible. You can reduce the number of germs on your skin by washing with CHG (chlorahexidine gluconate) Soap before surgery.  CHG is an antiseptic cleaner which kills germs and bonds with the skin to continue killing germs even  after washing.    Oral Hygiene is also important to reduce your risk of infection.  Remember - BRUSH YOUR TEETH THE MORNING OF SURGERY WITH YOUR REGULAR TOOTHPASTE  Please do not use if you have an allergy to CHG or antibacterial soaps. If your skin becomes reddened/irritated stop using the CHG.  Do not shave (including legs and underarms) for at least 48 hours prior to first CHG shower. It is OK to shave your face.  Please follow these instructions carefully.   1. Shower the NIGHT BEFORE SURGERY and the MORNING OF SURGERY with CHG.   2. If you chose to wash your hair, wash your hair first as usual with your normal shampoo.  3. After you shampoo, rinse your hair and body thoroughly to remove the shampoo.  4. Use CHG as you would any other liquid soap. You can apply CHG directly to the skin and wash gently with a scrungie or a clean washcloth.   5. Apply the CHG Soap to your body ONLY FROM THE NECK DOWN.  Do not use on open wounds or open sores. Avoid contact with your eyes, ears, mouth and genitals (private parts). Wash Face and genitals (private parts)  with your normal soap.  6. Wash thoroughly, paying special attention to the area where your surgery will be performed.  7. Thoroughly rinse your body with warm water from the neck down.  8. DO NOT shower/wash with your normal soap after using and rinsing off the CHG Soap.  9. Pat yourself dry  with a CLEAN TOWEL.  10. Wear CLEAN PAJAMAS to bed the night before surgery, wear comfortable clothes the morning of surgery  11. Place CLEAN SHEETS on your bed the night of your first shower and DO NOT SLEEP WITH PETS.  Day of Surgery:  Do not apply any deodorants/lotions.  Please wear clean clothes to the hospital/surgery center.   Remember to brush your teeth WITH YOUR REGULAR TOOTHPASTE.  Please read over the following fact sheets that you were given. Pain Booklet, Coughing and Deep Breathing and Surgical Site Infection  Prevention

## 2018-07-15 ENCOUNTER — Encounter (HOSPITAL_COMMUNITY)
Admission: RE | Admit: 2018-07-15 | Discharge: 2018-07-15 | Disposition: A | Discharge status: Home / Self Care | Payer: Medicare Other | Source: Ambulatory Visit | Attending: Otolaryngology | Admitting: Otolaryngology

## 2018-07-15 ENCOUNTER — Other Ambulatory Visit: Payer: Self-pay

## 2018-07-15 ENCOUNTER — Encounter (HOSPITAL_COMMUNITY): Payer: Self-pay

## 2018-07-15 DIAGNOSIS — G4733 Obstructive sleep apnea (adult) (pediatric): Secondary | ICD-10-CM | POA: Diagnosis not present

## 2018-07-15 DIAGNOSIS — Z01818 Encounter for other preprocedural examination: Secondary | ICD-10-CM | POA: Insufficient documentation

## 2018-07-15 DIAGNOSIS — J343 Hypertrophy of nasal turbinates: Secondary | ICD-10-CM | POA: Insufficient documentation

## 2018-07-15 DIAGNOSIS — J342 Deviated nasal septum: Secondary | ICD-10-CM | POA: Insufficient documentation

## 2018-07-15 HISTORY — DX: Anemia, unspecified: D64.9

## 2018-07-15 LAB — CBC
HCT: 40.6 % (ref 36.0–46.0)
Hemoglobin: 11.7 g/dL — ABNORMAL LOW (ref 12.0–15.0)
MCH: 22 pg — ABNORMAL LOW (ref 26.0–34.0)
MCHC: 28.8 g/dL — ABNORMAL LOW (ref 30.0–36.0)
MCV: 76.5 fL — ABNORMAL LOW (ref 80.0–100.0)
NRBC: 0 % (ref 0.0–0.2)
PLATELETS: 249 10*3/uL (ref 150–400)
RBC: 5.31 MIL/uL — AB (ref 3.87–5.11)
RDW: 15.9 % — AB (ref 11.5–15.5)
WBC: 4.2 10*3/uL (ref 4.0–10.5)

## 2018-07-15 LAB — BASIC METABOLIC PANEL
ANION GAP: 5 (ref 5–15)
BUN: 14 mg/dL (ref 8–23)
CALCIUM: 9.2 mg/dL (ref 8.9–10.3)
CO2: 27 mmol/L (ref 22–32)
CREATININE: 0.88 mg/dL (ref 0.44–1.00)
Chloride: 104 mmol/L (ref 98–111)
GFR calc Af Amer: >60 mL/min (ref >60)
Glucose, Bld: 98 mg/dL (ref 70–99)
Potassium: 3.8 mmol/L (ref 3.5–5.1)
SODIUM: 136 mmol/L (ref 135–145)

## 2018-07-15 NOTE — Progress Notes (Signed)
PCP - Dr. Theadore Nan Cardiologist - denies  Chest x-ray - N/A EKG - 07/18/17 Stress Test - denies ECHO - denies Cardiac Cath - denies  Sleep Study - pt stated she has had a sleep study done but can not use CPAP due to deviated septum.   Aspirin Instructions: N/A  Anesthesia review: No  Patient denies shortness of breath, fever, cough and chest pain at PAT appointment   Patient verbalized understanding of instructions that were given to them at the PAT appointment. Patient was also instructed that they will need to review over the PAT instructions again at home before surgery.

## 2018-07-18 ENCOUNTER — Other Ambulatory Visit: Payer: Self-pay | Admitting: Internal Medicine

## 2018-07-18 NOTE — Anesthesia Preprocedure Evaluation (Addendum)
Anesthesia Evaluation  Patient identified by MRN, date of birth, ID band Patient awake    Reviewed: Allergy & Precautions, NPO status , Patient's Chart, lab work & pertinent test results  Airway Mallampati: II  TM Distance: >3 FB Neck ROM: Full    Dental no notable dental hx.    Pulmonary asthma , sleep apnea ,    Pulmonary exam normal breath sounds clear to auscultation       Cardiovascular hypertension, Pt. on medications Normal cardiovascular exam Rhythm:Regular Rate:Normal  ECG: NSR, rate 87   Neuro/Psych PSYCHIATRIC DISORDERS Depression negative neurological ROS     GI/Hepatic Neg liver ROS, hiatal hernia, GERD  Medicated and Controlled,  Endo/Other  negative endocrine ROS  Renal/GU negative Renal ROS     Musculoskeletal negative musculoskeletal ROS (+)   Abdominal (+) + obese,   Peds  Hematology  (+) anemia , HLD   Anesthesia Other Findings Deviated nasal septum, nasal turbinate hypertrophy  Reproductive/Obstetrics                            Anesthesia Physical Anesthesia Plan  ASA: III  Anesthesia Plan: General   Post-op Pain Management:    Induction: Intravenous  PONV Risk Score and Plan: 3 and Midazolam, Dexamethasone, Ondansetron and Treatment may vary due to age or medical condition  Airway Management Planned: Oral ETT  Additional Equipment:   Intra-op Plan:   Post-operative Plan: Extubation in OR  Informed Consent: I have reviewed the patients History and Physical, chart, labs and discussed the procedure including the risks, benefits and alternatives for the proposed anesthesia with the patient or authorized representative who has indicated his/her understanding and acceptance.   Dental advisory given  Plan Discussed with: CRNA  Anesthesia Plan Comments:        Anesthesia Quick Evaluation

## 2018-07-19 ENCOUNTER — Encounter (HOSPITAL_COMMUNITY)
Admission: RE | Disposition: A | Discharge status: Home / Self Care | Payer: Self-pay | Source: Ambulatory Visit | Attending: Otolaryngology

## 2018-07-19 ENCOUNTER — Ambulatory Visit (HOSPITAL_COMMUNITY): Payer: Medicare Other | Admitting: Certified Registered"

## 2018-07-19 ENCOUNTER — Encounter (HOSPITAL_COMMUNITY): Payer: Self-pay

## 2018-07-19 ENCOUNTER — Other Ambulatory Visit: Payer: Self-pay

## 2018-07-19 ENCOUNTER — Ambulatory Visit (HOSPITAL_COMMUNITY)
Admission: RE | Admit: 2018-07-19 | Discharge: 2018-07-19 | Disposition: A | Discharge status: Home / Self Care | Payer: Medicare Other | Source: Ambulatory Visit | Attending: Otolaryngology | Admitting: Otolaryngology

## 2018-07-19 DIAGNOSIS — K219 Gastro-esophageal reflux disease without esophagitis: Secondary | ICD-10-CM | POA: Diagnosis not present

## 2018-07-19 DIAGNOSIS — E7800 Pure hypercholesterolemia, unspecified: Secondary | ICD-10-CM | POA: Diagnosis not present

## 2018-07-19 DIAGNOSIS — I10 Essential (primary) hypertension: Secondary | ICD-10-CM | POA: Diagnosis not present

## 2018-07-19 DIAGNOSIS — J343 Hypertrophy of nasal turbinates: Secondary | ICD-10-CM | POA: Diagnosis not present

## 2018-07-19 DIAGNOSIS — Z79899 Other long term (current) drug therapy: Secondary | ICD-10-CM | POA: Insufficient documentation

## 2018-07-19 DIAGNOSIS — J342 Deviated nasal septum: Secondary | ICD-10-CM | POA: Insufficient documentation

## 2018-07-19 DIAGNOSIS — J45909 Unspecified asthma, uncomplicated: Secondary | ICD-10-CM | POA: Diagnosis not present

## 2018-07-19 DIAGNOSIS — F419 Anxiety disorder, unspecified: Secondary | ICD-10-CM | POA: Diagnosis not present

## 2018-07-19 DIAGNOSIS — G4730 Sleep apnea, unspecified: Secondary | ICD-10-CM | POA: Diagnosis not present

## 2018-07-19 DIAGNOSIS — Z885 Allergy status to narcotic agent status: Secondary | ICD-10-CM | POA: Insufficient documentation

## 2018-07-19 DIAGNOSIS — F329 Major depressive disorder, single episode, unspecified: Secondary | ICD-10-CM | POA: Diagnosis not present

## 2018-07-19 HISTORY — PX: NASAL SEPTOPLASTY W/ TURBINOPLASTY: SHX2070

## 2018-07-19 SURGERY — SEPTOPLASTY, NOSE, WITH NASAL TURBINATE REDUCTION
Anesthesia: General | Site: Nose | Laterality: Bilateral

## 2018-07-19 MED ORDER — MIDAZOLAM HCL 5 MG/5ML IJ SOLN
INTRAMUSCULAR | Status: DC | PRN
  Administered 2018-07-19: 2 mg via INTRAVENOUS

## 2018-07-19 MED ORDER — OXYMETAZOLINE HCL 0.05 % NA SOLN
NASAL | Status: AC
  Filled 2018-07-19: qty 15

## 2018-07-19 MED ORDER — ONDANSETRON HCL 4 MG/2ML IJ SOLN
INTRAMUSCULAR | Status: AC
  Filled 2018-07-19: qty 4

## 2018-07-19 MED ORDER — ACETAMINOPHEN 10 MG/ML IV SOLN
INTRAVENOUS | Status: DC | PRN
  Administered 2018-07-19: 1000 mg via INTRAVENOUS

## 2018-07-19 MED ORDER — MUPIROCIN CALCIUM 2 % EX CREA
TOPICAL_CREAM | CUTANEOUS | Status: AC
  Filled 2018-07-19: qty 15

## 2018-07-19 MED ORDER — SUGAMMADEX SODIUM 200 MG/2ML IV SOLN
INTRAVENOUS | Status: DC | PRN
  Administered 2018-07-19: 200 mg via INTRAVENOUS

## 2018-07-19 MED ORDER — OXYMETAZOLINE HCL 0.05 % NA SOLN: 1.0000 | Freq: Two times a day (BID) | NASAL | Status: DC

## 2018-07-19 MED ORDER — GLYCOPYRROLATE 0.2 MG/ML IJ SOLN
INTRAMUSCULAR | Status: DC | PRN
  Administered 2018-07-19: .1 mg via INTRAVENOUS

## 2018-07-19 MED ORDER — OXYMETAZOLINE HCL 0.05 % NA SOLN
NASAL | Status: DC | PRN
  Administered 2018-07-19: 1

## 2018-07-19 MED ORDER — ACETAMINOPHEN 10 MG/ML IV SOLN
INTRAVENOUS | Status: AC
  Filled 2018-07-19: qty 300

## 2018-07-19 MED ORDER — LACTATED RINGERS IV SOLN
INTRAVENOUS | Status: DC | PRN
  Administered 2018-07-19: 07:00:00 via INTRAVENOUS

## 2018-07-19 MED ORDER — LIDOCAINE 2% (20 MG/ML) 5 ML SYRINGE
INTRAMUSCULAR | Status: DC | PRN
  Administered 2018-07-19: 60 mg via INTRAVENOUS

## 2018-07-19 MED ORDER — MIDAZOLAM HCL 2 MG/2ML IJ SOLN
INTRAMUSCULAR | Status: AC
  Filled 2018-07-19: qty 2

## 2018-07-19 MED ORDER — LIDOCAINE-EPINEPHRINE 1 %-1:100000 IJ SOLN
INTRAMUSCULAR | Status: DC | PRN
  Administered 2018-07-19: 5 mL

## 2018-07-19 MED ORDER — EPHEDRINE 5 MG/ML INJ
INTRAVENOUS | Status: AC
  Filled 2018-07-19: qty 20

## 2018-07-19 MED ORDER — CEFAZOLIN SODIUM-DEXTROSE 2-4 GM/100ML-% IV SOLN: 2.0000 g | INTRAVENOUS | Status: DC

## 2018-07-19 MED ORDER — MUPIROCIN 2 % EX OINT
TOPICAL_OINTMENT | CUTANEOUS | Status: AC
  Filled 2018-07-19: qty 22

## 2018-07-19 MED ORDER — CEPHALEXIN 500 MG PO CAPS: 500.0000 mg | ORAL_CAPSULE | Freq: Three times a day (TID) | ORAL | 1 refills | Status: AC

## 2018-07-19 MED ORDER — ROCURONIUM BROMIDE 50 MG/5ML IV SOSY
PREFILLED_SYRINGE | INTRAVENOUS | Status: AC
  Filled 2018-07-19: qty 5

## 2018-07-19 MED ORDER — SUGAMMADEX SODIUM 500 MG/5ML IV SOLN
INTRAVENOUS | Status: AC
  Filled 2018-07-19: qty 10

## 2018-07-19 MED ORDER — LIDOCAINE 2% (20 MG/ML) 5 ML SYRINGE
INTRAMUSCULAR | Status: AC
  Filled 2018-07-19: qty 10

## 2018-07-19 MED ORDER — ONDANSETRON HCL 4 MG/2ML IJ SOLN: 4.0000 mg | Freq: Once | INTRAMUSCULAR | Status: DC | PRN

## 2018-07-19 MED ORDER — FENTANYL CITRATE (PF) 100 MCG/2ML IJ SOLN
INTRAMUSCULAR | Status: DC | PRN
  Administered 2018-07-19: 100 ug via INTRAVENOUS

## 2018-07-19 MED ORDER — DEXAMETHASONE SODIUM PHOSPHATE 10 MG/ML IJ SOLN
INTRAMUSCULAR | Status: AC
  Filled 2018-07-19: qty 1

## 2018-07-19 MED ORDER — STERILE WATER FOR IRRIGATION IR SOLN
Status: DC | PRN
  Administered 2018-07-19 (×2): 1000 mL

## 2018-07-19 MED ORDER — DEXAMETHASONE SODIUM PHOSPHATE 10 MG/ML IJ SOLN
10.0000 mg | Freq: Once | INTRAMUSCULAR | Status: AC
  Administered 2018-07-19: 10 mg via INTRAVENOUS

## 2018-07-19 MED ORDER — EPHEDRINE SULFATE-NACL 50-0.9 MG/10ML-% IV SOSY
PREFILLED_SYRINGE | INTRAVENOUS | Status: DC | PRN
  Administered 2018-07-19: 5 mg via INTRAVENOUS

## 2018-07-19 MED ORDER — HYDROCODONE-ACETAMINOPHEN 5-325 MG PO TABS: 1.0000 | ORAL_TABLET | Freq: Four times a day (QID) | ORAL | 0 refills | Status: AC | PRN

## 2018-07-19 MED ORDER — ROCURONIUM BROMIDE 10 MG/ML (PF) SYRINGE
PREFILLED_SYRINGE | INTRAVENOUS | Status: DC | PRN
  Administered 2018-07-19: 50 mg via INTRAVENOUS

## 2018-07-19 MED ORDER — SODIUM CHLORIDE 0.9 % IV SOLN
INTRAVENOUS | Status: DC | PRN
  Administered 2018-07-19: 30 ug/min via INTRAVENOUS

## 2018-07-19 MED ORDER — CHLORHEXIDINE GLUCONATE CLOTH 2 % EX PADS: 6.0000 | MEDICATED_PAD | Freq: Once | CUTANEOUS | Status: DC

## 2018-07-19 MED ORDER — 0.9 % SODIUM CHLORIDE (POUR BTL) OPTIME
TOPICAL | Status: DC | PRN
  Administered 2018-07-19: 1000 mL

## 2018-07-19 MED ORDER — FENTANYL CITRATE (PF) 250 MCG/5ML IJ SOLN
INTRAMUSCULAR | Status: AC
  Filled 2018-07-19: qty 5

## 2018-07-19 MED ORDER — CEFAZOLIN SODIUM-DEXTROSE 2-3 GM-%(50ML) IV SOLR
INTRAVENOUS | Status: DC | PRN
  Administered 2018-07-19: 2 g via INTRAVENOUS

## 2018-07-19 MED ORDER — PROPOFOL 10 MG/ML IV BOLUS
INTRAVENOUS | Status: AC
  Filled 2018-07-19: qty 20

## 2018-07-19 MED ORDER — FENTANYL CITRATE (PF) 100 MCG/2ML IJ SOLN: 25.0000 ug | INTRAMUSCULAR | Status: DC | PRN

## 2018-07-19 MED ORDER — LIDOCAINE-EPINEPHRINE 1 %-1:100000 IJ SOLN
INTRAMUSCULAR | Status: AC
  Filled 2018-07-19: qty 1

## 2018-07-19 MED ORDER — CEFAZOLIN SODIUM-DEXTROSE 2-4 GM/100ML-% IV SOLN
INTRAVENOUS | Status: AC
  Filled 2018-07-19: qty 100

## 2018-07-19 MED ORDER — PROPOFOL 10 MG/ML IV BOLUS
INTRAVENOUS | Status: DC | PRN
  Administered 2018-07-19: 140 mg via INTRAVENOUS

## 2018-07-19 MED ORDER — ONDANSETRON HCL 4 MG/2ML IJ SOLN
INTRAMUSCULAR | Status: DC | PRN
  Administered 2018-07-19: 4 mg via INTRAVENOUS

## 2018-07-19 MED ORDER — DEXAMETHASONE SODIUM PHOSPHATE 10 MG/ML IJ SOLN
INTRAMUSCULAR | Status: AC
  Filled 2018-07-19: qty 2

## 2018-07-19 SURGICAL SUPPLY — 26 items
BLADE SURG 15 STRL LF DISP TIS (BLADE) ×1 IMPLANT
BLADE SURG 15 STRL SS (BLADE) ×2
CANISTER SUCT 3000ML PPV (MISCELLANEOUS) ×2 IMPLANT
COAGULATOR SUCT 8FR VV (MISCELLANEOUS) IMPLANT
COVER WAND RF STERILE (DRAPES) ×2 IMPLANT
ELECT REM PT RETURN 9FT ADLT (ELECTROSURGICAL) ×2
ELECTRODE REM PT RTRN 9FT ADLT (ELECTROSURGICAL) ×1 IMPLANT
GAUZE SPONGE 2X2 8PLY STRL LF (GAUZE/BANDAGES/DRESSINGS) ×1 IMPLANT
GLOVE BIOGEL M 7.0 STRL (GLOVE) ×4 IMPLANT
GOWN STRL REUS W/ TWL LRG LVL3 (GOWN DISPOSABLE) ×3 IMPLANT
GOWN STRL REUS W/TWL LRG LVL3 (GOWN DISPOSABLE) ×6
KIT BASIN OR (CUSTOM PROCEDURE TRAY) ×2 IMPLANT
KIT TURNOVER KIT B (KITS) ×2 IMPLANT
NDL HYPO 25GX1X1/2 BEV (NEEDLE) ×1 IMPLANT
NEEDLE HYPO 25GX1X1/2 BEV (NEEDLE) ×2 IMPLANT
NS IRRIG 1000ML POUR BTL (IV SOLUTION) ×2 IMPLANT
PAD ARMBOARD 7.5X6 YLW CONV (MISCELLANEOUS) ×2 IMPLANT
SPLINT NASAL DOYLE BI-VL (GAUZE/BANDAGES/DRESSINGS) ×2 IMPLANT
SPONGE GAUZE 2X2 STER 10/PKG (GAUZE/BANDAGES/DRESSINGS) ×1
SPONGE NEURO XRAY DETECT 1X3 (DISPOSABLE) ×2 IMPLANT
SUT ETHILON 3 0 PS 1 (SUTURE) ×2 IMPLANT
SUT PLAIN 4 0 ~~LOC~~ 1 (SUTURE) ×2 IMPLANT
TOWEL GREEN STERILE FF (TOWEL DISPOSABLE) ×2 IMPLANT
TRAY ENT MC OR (CUSTOM PROCEDURE TRAY) ×2 IMPLANT
TUBE SALEM SUMP 16 FR W/ARV (TUBING) ×2 IMPLANT
TUBING EXTENTION W/L.L. (IV SETS) ×2 IMPLANT

## 2018-07-19 NOTE — Op Note (Signed)
Operative Note: SEPTOPLASTY AND INFERIOR TURBINATE REDUCTION  Patient: [name]  Medical record number: [id number]  Date:07/19/2018  Pre-operative Indications: 1. Deviated nasal septum with nasal airway obstruction     2. Bilateral inferior turbinate hypertrophy  Postoperative Indications: Same  Surgical Procedure: 1.  Nasal Septoplasty    2.  Bilateral Inferior Turbinate Reduction  Anesthesia: GET  Surgeon: Delsa Bern, M.D.  Complications: None  EBL: 50 cc  Findings: Severely deviated nasal septum with airway obstruction and bilateral inferior turbinate hypertrophy.   Brief History: The patient is a 67 y.o. female with a history of progressive nasal airway obstruction. The patient has been on medical therapy to reduce nasal mucosal edema including saline nasal spray and topical nasal steroids. Despite appropriate medical therapy the patient continues to have ongoing symptoms. Given the patient's history and findings, the above surgical procedures were recommended, risks and benefits were discussed in detail with the patient may understand and agree with our plan for surgery which is scheduled at [facility] under general anesthesia as an outpatient.  Surgical Procedure: The patient is brought to the operating room on 07/19/2018 and placed in supine position on the operating table. General endotracheal anesthesia was established without difficulty. When the patient was adequately anesthetized, surgical timeout was performed and correct identification of the patient and the surgical procedure. The patient's nose was then injected with  6 cc of 1% lidocaine 1 100,000 dilution epinephrine which was injected in a submucosal fashion. The patient's nose was then packed with Afrin-soaked cottonoid pledgets were left in place for approximately 10 minutes lateral vasoconstriction and hemostasis. .  With the patient prepped draped and prepared for surgery, nasal septoplasty was  begun.  A left anterior hemitransfixion incision was created and a mucoperichondrial flap was elevated from anterior to posterior on the left-hand side. The anterior cartilaginous septum was crossed at the midline and a mucoperichondrial flap was elevated on the patient's right.  Swivel knife was then used to resect the anterior and mid cartilaginous portion of the nasal septum.  Resected cartilage was morcellized and returned to the mucoperichondrial pocket at the occlusion of the surgical procedure.  Dissection was then carried out from anterior to posterior removing deviated bone and cartilage including a large septal spur the overlying mucosa was preserved.  With the septum brought to good midline position, the morselized cartilage was returned to the mucoperichondrial pocket and the soft tissue/mucosal flaps were reapproximated with interrupted 4-0 gut suture on a Keith needle in a horizontal mattressing fashion.  Anterior hemitransfixion incision was closed with the same stitch.  Bilateral Doyle nasal septal splints were then placed after the application of Bactroban ointment and sutured in position with a 3-0 Ethilon suture.  Attention was then turned to the inferior turbinates, bilateral inferior turbinate intramural cautery was performed with cautery setting at 67 W.  2 submucosal passes were made in each inferior turbinate.  After completing cautery, anterior vertical incisions were created and overlying soft tissue was elevated, a small amount of turbinate bone was resected.  The turbinates were then outfractured to create a more patent nasal passageway.  Surgical sponge count was correct. An oral gastric tube was passed and the stomach contents were aspirated. Patient was awakened from anesthetic and transferred from the operating room to the recovery room in stable condition. There were no complications and blood loss was 50cc.   Delsa Bern, M.D. [facility] 07/19/2018

## 2018-07-19 NOTE — Discharge Instructions (Signed)
Received Tylenol 1000mg  at 8am, NO Tylenol products until after 2pm!!

## 2018-07-19 NOTE — Anesthesia Procedure Notes (Signed)
Procedure Name: Intubation Date/Time: 07/19/2018 7:50 AM Performed by: Cleda Daub, CRNA Pre-anesthesia Checklist: Patient identified, Emergency Drugs available, Suction available and Patient being monitored Patient Re-evaluated:Patient Re-evaluated prior to induction Oxygen Delivery Method: Circle system utilized Preoxygenation: Pre-oxygenation with 100% oxygen Induction Type: IV induction Ventilation: Mask ventilation without difficulty and Mask ventilation throughout procedure Laryngoscope Size: Mac and 3 Grade View: Grade I Tube type: Oral Tube size: 7.0 mm Number of attempts: 1 Airway Equipment and Method: Stylet Placement Confirmation: ETT inserted through vocal cords under direct vision,  positive ETCO2 and breath sounds checked- equal and bilateral Secured at: 22 cm Tube secured with: Tape Dental Injury: Teeth and Oropharynx as per pre-operative assessment

## 2018-07-19 NOTE — H&P (Signed)
[name] is an 67 y.o. female.   Chief Complaint: Nasal Obstruction HPI: Prg Septal Deviation  Past Medical History:  Diagnosis Date  . Acid reflux   . Anemia    "during the time I was taking meloxicam"  . Arthritis   . Asthma    LOV  6/13  Dr Annamaria Boots EPIC/ clearance on chart  from 10/12  . Depression   . Dysrhythmia    PVC's with caffeine and anxiety  . Heart murmur    since childhood  . Hiatal hernia   . High cholesterol   . Hypertension    OV with clearance and note Dr Addison Lank 6/13 chart  . Osteopenia   . Osteoporosis   . Sleep apnea    no CPAP/ last study 9 yrs ago- "mild per patient"  . STD (sexually transmitted disease)    HSV2  . Stye 06/2009   eye    Past Surgical History:  Procedure Laterality Date  . bilateral hip replacement Bilateral 6/07, 9/07   right, then left  . BREAST REDUCTION SURGERY    . CESAREAN SECTION    . FRACTURE SURGERY     Left leg-1977  . NASAL SEPTUM SURGERY    . TOTAL KNEE ARTHROPLASTY  03/18/2012   Procedure: TOTAL KNEE ARTHROPLASTY;  Surgeon: Gearlean Alf, MD;  Location: [facility];  Service: Orthopedics;  Laterality: Left;  . TOTAL KNEE ARTHROPLASTY Right 07/23/2017   Procedure: RIGHT TOTAL KNEE ARTHROPLASTY;  Surgeon: Gaynelle Arabian, MD;  Location: [facility];  Service: Orthopedics;  Laterality: Right;    Family History  Problem Relation Age of Onset  . Heart disease Father   . Cancer Father   . Hypertension Father   . Kidney Stones Father   . Arthritis Mother   . Hypertension Mother   . Pancreatic cancer Unknown   . Coronary artery disease Other   . Cancer Other   . Asthma Other   . Nephritis Daughter        glomerular   Social History:  reports that she has never smoked. She has never used smokeless tobacco. She reports that she drinks alcohol. She reports that she does not use drugs.  Allergies:  Allergies  Allergen Reactions  . Demerol [Meperidine] Nausea And Vomiting  . Meloxicam Other (See Comments)    Blood  count went down   . Nadolol Other (See Comments)    Low blood pressure     Medications Prior to Admission  Medication Sig Dispense Refill  . albuterol (PROVENTIL HFA;VENTOLIN HFA) 108 (90 Base) MCG/ACT inhaler Inhale 2 puffs into the lungs every 4 (four) hours as needed for wheezing or shortness of breath. 1 Inhaler 12  . amLODipine (NORVASC) 5 MG tablet Take 5 mg by mouth daily before breakfast.     . atorvastatin (LIPITOR) 40 MG tablet Take 40 mg by mouth daily.    Marland Kitchen azelastine (ASTELIN) 0.1 % nasal spray USE 1-2 SPRAYS IN EACH NOSTRIL AT BEDTIME (Patient taking differently: Place 1-2 sprays into both nostrils at bedtime as needed for rhinitis. ) 30 mL 12  . Calcium Carb-Cholecalciferol (CALCIUM + VITAMIN D3 PO) Take 1 tablet by mouth daily.    . cetirizine (ZYRTEC) 10 MG tablet Take 10 mg by mouth daily as needed for allergies.     . Cholecalciferol (VITAMIN D3) 5000 units CAPS Take 5,000 Units by mouth daily.    . cloNIDine (CATAPRES) 0.1 MG tablet Take 0.1 mg by mouth at bedtime.    Marland Kitchen  esomeprazole (NEXIUM) 40 MG capsule Take 40 mg by mouth daily before breakfast.     . Eszopiclone 3 MG TABS Take 3 mg at bedtime by mouth. Take immediately before bedtime     . fluticasone (FLONASE) 50 MCG/ACT nasal spray SPRAY 2 SPRAYS INTO EACH NOSTRIL EVERY DAY (Patient taking differently: Place 2 sprays into both nostrils daily as needed for allergies. ) 48 g 3  . Fluticasone-Salmeterol (ADVAIR DISKUS) 100-50 MCG/DOSE AEPB INHALE 1 PUFF BY MOUTH TWICE DAILY. RINSE MOUTH AFTER USE (Patient taking differently: Inhale 1 puff into the lungs 2 (two) times daily. ) 180 each 12  . magnesium gluconate (MAGONATE) 500 MG tablet Take 500 mg at bedtime by mouth.     . methocarbamol (ROBAXIN) 500 MG tablet Take 1 tablet (500 mg total) every 6 (six) hours as needed by mouth for muscle spasms. (Patient taking differently: Take 500 mg by mouth every 8 (eight) hours as needed for muscle spasms. ) 80 tablet 0  .  montelukast (SINGULAIR) 10 MG tablet TAKE 1 TABLET BY MOUTH EVERYDAY AT BEDTIME (Patient taking differently: Take 10 mg by mouth at bedtime. ) 90 tablet 1  . Multiple Vitamin (MULTI-VITAMINS) TABS Take 1 tablet by mouth daily.     . valACYclovir (VALTREX) 500 MG tablet Take 1 tablet (500 mg total) by mouth See admin instructions. TAKE 1 TABLET BY MOUTH EVERY DAY INCREASE TO TWICE A DAY FOR 3 DAYS WITH SYMPTOMS AS NEEDED (Patient taking differently: Take 500 mg by mouth daily. ) 45 tablet 12  . mometasone (NASONEX) 50 MCG/ACT nasal spray USE 2 SPRAYS INTO EACH NOSTRIL EVERY DAY AS NEEDED FOR ALLERGIES 17 g 12    No results found for this or any previous visit (from the past 64 hour(s)). No results found.  Review of Systems  Constitutional: Negative.   HENT: Positive for congestion.   Respiratory: Negative.   Cardiovascular: Negative.     Blood pressure 132/77, pulse 64, temperature 98.1 F (36.7 C), temperature source Oral, resp. rate 18, height 5' 3.5" (1.613 m), weight 81.6 kg, last menstrual period 09/05/2003, SpO2 95 %. Physical Exam  Constitutional: She appears well-developed and well-nourished.  HENT:  Septal Deviation  Neck: Normal range of motion. Neck supple.  Cardiovascular: Normal rate.  Respiratory: Effort normal.     Assessment/Plan Adm for OP septoplasty and IT reduction.    Jerrell Belfast, MD 07/19/2018, 7:33 AM

## 2018-07-19 NOTE — Transfer of Care (Signed)
Immediate Anesthesia Transfer of Care Note  Patient: [name]  Procedure(s) Performed: NASAL SEPTOPLASTY WITH TURBINATE REDUCTION (Bilateral Nose)  Patient Location: PACU  Anesthesia Type:General  Level of Consciousness: awake, alert , oriented and patient cooperative  Airway & Oxygen Therapy: Patient Spontanous Breathing and Patient connected to face mask oxygen  Post-op Assessment: Report given to RN and Post -op Vital signs reviewed and stable  Post vital signs: Reviewed and stable  Last Vitals:  Vitals Value Taken Time  BP 156/88 07/19/2018  8:43 AM  Temp    Pulse 83 07/19/2018  8:46 AM  Resp 14 07/19/2018  8:46 AM  SpO2 94 % 07/19/2018  8:46 AM  Vitals shown include unvalidated device data.  Last Pain:  Vitals:   07/19/18 0233  TempSrc:   PainSc: 0-No pain         Complications: No apparent anesthesia complications

## 2018-07-19 NOTE — Anesthesia Postprocedure Evaluation (Signed)
Anesthesia Post Note  Patient: [name]  Procedure(s) Performed: NASAL SEPTOPLASTY WITH TURBINATE REDUCTION (Bilateral Nose)     Patient location during evaluation: PACU Anesthesia Type: General Level of consciousness: awake and alert Pain management: pain level controlled Vital Signs Assessment: post-procedure vital signs reviewed and stable Respiratory status: spontaneous breathing, nonlabored ventilation, respiratory function stable and patient connected to nasal cannula oxygen Cardiovascular status: blood pressure returned to baseline and stable Postop Assessment: no apparent nausea or vomiting Anesthetic complications: no    Last Vitals:  Vitals:   07/19/18 0858 07/19/18 0914  BP: 129/84 128/71  Pulse: 74 68  Resp: 15 15  Temp:    SpO2: 94% 97%    Last Pain:  Vitals:   07/19/18 0900  TempSrc:   PainSc: 0-No pain                 Marquite Attwood P Sachiko Methot

## 2018-07-20 ENCOUNTER — Encounter (HOSPITAL_COMMUNITY): Payer: Self-pay | Admitting: Otolaryngology

## 2018-09-22 ENCOUNTER — Other Ambulatory Visit: Payer: Self-pay | Admitting: Internal Medicine

## 2018-10-22 ENCOUNTER — Other Ambulatory Visit: Payer: Self-pay | Admitting: Family Medicine

## 2018-10-22 ENCOUNTER — Other Ambulatory Visit: Payer: Self-pay

## 2018-10-22 DIAGNOSIS — R1030 Lower abdominal pain, unspecified: Secondary | ICD-10-CM

## 2019-01-02 ENCOUNTER — Ambulatory Visit: Payer: Medicare Other | Admitting: Internal Medicine

## 2019-01-14 ENCOUNTER — Other Ambulatory Visit: Payer: Self-pay | Admitting: Internal Medicine

## 2019-02-16 ENCOUNTER — Other Ambulatory Visit: Payer: Self-pay | Admitting: Internal Medicine

## 2019-02-17 NOTE — Telephone Encounter (Signed)
Is this refill appropriate?  

## 2019-02-17 NOTE — Telephone Encounter (Signed)
Flonase refill e-sent

## 2019-03-25 ENCOUNTER — Other Ambulatory Visit: Payer: Self-pay | Admitting: Internal Medicine

## 2019-03-27 ENCOUNTER — Encounter: Payer: Self-pay | Admitting: Internal Medicine

## 2019-03-27 ENCOUNTER — Ambulatory Visit: Payer: Medicare Other | Admitting: Internal Medicine

## 2019-03-27 ENCOUNTER — Other Ambulatory Visit: Payer: Self-pay

## 2019-03-27 VITALS — BP 122/86 | HR 73 | Temp 97.8°F | Ht 66.0 in | Wt 202.8 lb

## 2019-03-27 DIAGNOSIS — J4530 Mild persistent asthma, uncomplicated: Secondary | ICD-10-CM

## 2019-03-27 DIAGNOSIS — G4733 Obstructive sleep apnea (adult) (pediatric): Secondary | ICD-10-CM

## 2019-03-27 MED ORDER — AZELASTINE HCL 0.1 % NA SOLN: NASAL | 3 refills | Status: DC

## 2019-03-27 MED ORDER — FLUTICASONE PROPIONATE 50 MCG/ACT NA SUSP: 2.0000 | Freq: Every day | NASAL | 3 refills | Status: DC | PRN

## 2019-03-27 MED ORDER — FLUTICASONE-SALMETEROL 100-50 MCG/DOSE IN AEPB: 1.0000 | INHALATION_SPRAY | Freq: Two times a day (BID) | RESPIRATORY_TRACT | 3 refills | Status: DC

## 2019-03-27 MED ORDER — MONTELUKAST SODIUM 10 MG PO TABS: ORAL_TABLET | ORAL | 3 refills | Status: DC

## 2019-03-27 MED ORDER — AMOXICILLIN-POT CLAVULANATE 875-125 MG PO TABS: 1.0000 | ORAL_TABLET | Freq: Two times a day (BID) | ORAL | 0 refills | Status: DC

## 2019-03-27 MED ORDER — ALBUTEROL SULFATE HFA 108 (90 BASE) MCG/ACT IN AERS: INHALATION_SPRAY | RESPIRATORY_TRACT | 3 refills | Status: DC

## 2019-03-27 NOTE — Progress Notes (Signed)
Patient ID: [name], female    DOB: 10-Jun-1951, 68 y.o.   MRN: [id number]  HPI  F never smoker folowed for asthma, allergic rhinitis, complicated by hx OSA/ oral appliance, HBP, GERD PFT 05/30/10- FEV1 2.56/ 91%, FEV1/FVC 0.73, DLCO 84%  within normal limits. NPSG-04/21/14- Moderate obstructive sleep apnea, AHI 21/ hr, CPAP to 14, weight 185 lbs  -----------------------------------------------------------------   01/01/2018- 68 year old female never smoker followed for asthma, allergic rhinitis, OSA-failedCPAP/ oral appliance, complicated by HBP, GERD Singulair, Advair 100-50, albuterol HFA, Lunesta 6 month follow up for asthma. States her breathing has been ok since the last visit.  Has lost 40 lbs since knee surgery through sustained diet and exercise.  She says she feels very well. She is not being told now that she snores and she denies daytime sleepiness.  She has been told in the past that she had a significant nasal septal deviation that might contribute to snoring and sleep apnea.  She can return to ENT evaluation if needed. Seasonal allergies and asthma have been very well controlled.  Only taking occasional Zyrtec.  Rare respiratory infection in the past year.  Infrequent need for rescue inhaler.   03/27/2019- 68 year old female never smoker followed for asthma, allergic rhinitis, OSA-failedCPAP/ oral appliance, complicated by HBP, GERD Singulair, Advair 100-50, albuterol HFA, Lunesta Had septoplasty/ turbinate reduction in November- [facility]. Still gets nasal congestion occ at night.  -----pt states she's having trouble exercising & feels as if her asthma is generally "worse" d/t not being able to go outside and exercise; inquiring if she needs to be back on CPAP Body weight today- 202 lbs Ventolin hfa, Advair 100, Lunesta 3mg , Flonase Aware of snoring.   Review of Systems- see HPI  + = positive Constitutional:   +  weight loss- see HPI, night sweats, fevers, chills,  +fatigue, lassitude. HEENT:   +  headaches, difficulty swallowing, tooth/dental problems, sore throat,       No-  sneezing, itching, ear ache, +nasal congestion, post nasal drip,  CV:  No-   chest pain, orthopnea, PND, swelling in lower extremities, anasarca,  dizziness, palpitations Resp: No-   shortness of breath with exertion or at rest.              No-   productive cough,  No non-productive cough,  No- coughing up of blood.              No-   change in color of mucus. +wheezing.   Skin: No-   rash or lesions. GI:  +heartburn, indigestion, abdominal pain, nausea, vomiting, GU: . MS:  See HPI-  joint pain or swelling.   Neuro-     nothing unusual Psych:  No- change in mood or affect. No depression or anxiety.  No memory loss.  OBJ General- Alert, Oriented, Affect-appropriate, Distress- none acute. + Overweight Skin- rash-none, lesions- none, excoriation- none Lymphadenopathy- none Head- atraumatic            Eyes- Gross vision intact, PERRLA, conjunctivae + lightly injected right with clear secretion            Ears- Hearing, canals-normal            Nose- + actually looks more open on the left, mucus+, no-polyps, erosion, perforation + blowing nose            Throat- Mallampati III , mucosa clear , drainage- none, tonsils- atrophic, own teeth Neck- flexible , trachea midline, no stridor , thyroid nl,  carotid no bruit Chest - symmetrical excursion , unlabored           Heart/CV- RRR , no murmur , no gallop  , no rub, nl s1 s2                           - JVD- none , edema- none, stasis changes- none, varices- none           Lung-  Wheeze+ trace R scapula, cough- none , dullness-none, rub- none           Chest wall-  Abd-  Br/ Gen/ Rectal- Not done, not indicated Extrem- cyanosis- none, clubbing, none, atrophy- none, strength- nl Neuro- grossly intact to observation

## 2019-03-27 NOTE — Patient Instructions (Signed)
Med refills changed to 3 month supply  Order- schedule unattended home sleep test  Please call about 2 weeks after your sleep study to see if results and recommendations are ready yet.

## 2019-04-22 ENCOUNTER — Ambulatory Visit: Payer: Medicare Other | Admitting: Internal Medicine

## 2019-05-13 NOTE — Assessment & Plan Note (Signed)
Mild exacerbation she blame son lack of exercise and hot weather. Plan- discussed and refilled meds., augmentin script to hold at her request. Covid discussed.

## 2019-05-13 NOTE — Assessment & Plan Note (Signed)
She is motivated to re-evaluate. Plan- sleep study

## 2019-05-21 ENCOUNTER — Encounter: Payer: Self-pay | Admitting: Obstetrics & Gynecology

## 2019-05-26 ENCOUNTER — Telehealth: Payer: Self-pay | Admitting: Internal Medicine

## 2019-05-26 NOTE — Telephone Encounter (Signed)
Ok thanks. Assume she doesn't want it done now.

## 2019-05-26 NOTE — Telephone Encounter (Signed)
Dr. Annamaria Boots when I called this patient on 04/22/2019 she stated she wanted me to call her back the 1st week of Sept 2020 to schedule. I have left messages on 09/09 and 09/18 with no response from the patient to schedule the HST that you ordered

## 2019-07-14 ENCOUNTER — Other Ambulatory Visit: Payer: Self-pay | Admitting: Internal Medicine

## 2019-08-18 ENCOUNTER — Other Ambulatory Visit: Payer: Self-pay | Admitting: Internal Medicine

## 2019-08-21 ENCOUNTER — Telehealth: Payer: Self-pay | Admitting: Internal Medicine

## 2019-08-21 MED ORDER — AMOXICILLIN-POT CLAVULANATE 875-125 MG PO TABS: 1.0000 | ORAL_TABLET | Freq: Two times a day (BID) | ORAL | 1 refills | Status: DC

## 2019-08-21 NOTE — Telephone Encounter (Signed)
augmentin rx has been refilled for pt. Attempted to call pt but unable to reach. Per dpr, it is okay to leave a detailed message so detailed message was left on pt's machine that this was done. Nothing further needed.

## 2019-08-21 NOTE — Telephone Encounter (Signed)
Ok to refill the augmentin script with one additional refill, to her drug store   thanks

## 2019-08-21 NOTE — Telephone Encounter (Signed)
Called and spoke with pt who stated she has had sinus problems and chest congestion x1 week. Pt said she is currently taking augmentin which she began about 3 days ago. Pt said she likes to keep a refill on hand in case she needs to have one.  Pt denies any fever. Pt also denies any cough.   Pt said with the antibiotic she has been taking, she is feeling better but does like to have another Rx on hand if she needed it. Dr. Annamaria Boots, please advise on this for pt. Thanks!  Allergies  Allergen Reactions  . Demerol [Meperidine] Nausea And Vomiting  . Meloxicam Other (See Comments)    Blood count went down   . Nadolol Other (See Comments)    Low blood pressure      Current Outpatient Medications:  .  albuterol (VENTOLIN HFA) 108 (90 Base) MCG/ACT inhaler, INHALE 2 PUFFS INTO THE LUNGS EVERY 4 (FOUR) HOURS AS NEEDED FOR WHEEZING OR SHORTNESS OF BREATH., Disp: 54 g, Rfl: 3 .  amLODipine (NORVASC) 5 MG tablet, Take 5 mg by mouth daily before breakfast. , Disp: , Rfl:  .  amoxicillin-clavulanate (AUGMENTIN) 875-125 MG tablet, Take 1 tablet by mouth 2 (two) times daily., Disp: 14 tablet, Rfl: 0 .  atorvastatin (LIPITOR) 40 MG tablet, Take 40 mg by mouth daily., Disp: , Rfl:  .  azelastine (ASTELIN) 0.1 % nasal spray, 1-2 puffs each nostril daily as needed, Disp: 90 mL, Rfl: 3 .  Calcium Carb-Cholecalciferol (CALCIUM + VITAMIN D3 PO), Take 1 tablet by mouth daily., Disp: , Rfl:  .  cetirizine (ZYRTEC) 10 MG tablet, Take 10 mg by mouth daily as needed for allergies. , Disp: , Rfl:  .  Cholecalciferol (VITAMIN D3) 5000 units CAPS, Take 5,000 Units by mouth daily., Disp: , Rfl:  .  cloNIDine (CATAPRES) 0.1 MG tablet, Take 0.1 mg by mouth at bedtime., Disp: , Rfl:  .  esomeprazole (NEXIUM) 40 MG capsule, Take 40 mg by mouth daily before breakfast. , Disp: , Rfl:  .  Eszopiclone 3 MG TABS, Take 3 mg at bedtime by mouth. Take immediately before bedtime , Disp: , Rfl:  .  fluticasone (FLONASE) 50 MCG/ACT  nasal spray, PLACE 2 SPRAYS INTO BOTH NOSTRILS DAILY AS NEEDED FOR ALLERGIES., Disp: 48 mL, Rfl: 1 .  Fluticasone-Salmeterol (ADVAIR DISKUS) 100-50 MCG/DOSE AEPB, Inhale 1 puff into the lungs 2 (two) times daily., Disp: 180 each, Rfl: 3 .  Iron-Vitamin C (IRON 100/C) 100-250 MG TABS, Take by mouth., Disp: , Rfl:  .  magnesium gluconate (MAGONATE) 500 MG tablet, Take 500 mg at bedtime by mouth. , Disp: , Rfl:  .  methocarbamol (ROBAXIN) 500 MG tablet, Take 1 tablet (500 mg total) every 6 (six) hours as needed by mouth for muscle spasms. (Patient taking differently: Take 500 mg by mouth every 8 (eight) hours as needed for muscle spasms. ), Disp: 80 tablet, Rfl: 0 .  montelukast (SINGULAIR) 10 MG tablet, 1 daily, Disp: 90 tablet, Rfl: 3 .  Multiple Vitamin (MULTI-VITAMINS) TABS, Take 1 tablet by mouth daily. , Disp: , Rfl:  .  valACYclovir (VALTREX) 500 MG tablet, Take 1 tablet (500 mg total) by mouth See admin instructions. TAKE 1 TABLET BY MOUTH EVERY DAY INCREASE TO TWICE A DAY FOR 3 DAYS WITH SYMPTOMS AS NEEDED (Patient taking differently: Take 500 mg by mouth daily. ), Disp: 45 tablet, Rfl: 12

## 2019-08-22 ENCOUNTER — Ambulatory Visit: Payer: Medicare Other | Admitting: Pulmonary Disease

## 2019-08-29 ENCOUNTER — Emergency Department (HOSPITAL_COMMUNITY)
Admission: EM | Admit: 2019-08-29 | Discharge: 2019-08-29 | Disposition: A | Discharge status: Home / Self Care | Payer: Medicare Other | Attending: Emergency Medicine | Admitting: Emergency Medicine

## 2019-08-29 ENCOUNTER — Emergency Department (HOSPITAL_COMMUNITY): Payer: Medicare Other

## 2019-08-29 ENCOUNTER — Other Ambulatory Visit: Payer: Self-pay

## 2019-08-29 DIAGNOSIS — W19XXXA Unspecified fall, initial encounter: Secondary | ICD-10-CM

## 2019-08-29 DIAGNOSIS — Y999 Unspecified external cause status: Secondary | ICD-10-CM | POA: Diagnosis not present

## 2019-08-29 DIAGNOSIS — Y9389 Activity, other specified: Secondary | ICD-10-CM | POA: Diagnosis not present

## 2019-08-29 DIAGNOSIS — Z96643 Presence of artificial hip joint, bilateral: Secondary | ICD-10-CM | POA: Diagnosis not present

## 2019-08-29 DIAGNOSIS — S73005A Unspecified dislocation of left hip, initial encounter: Secondary | ICD-10-CM

## 2019-08-29 DIAGNOSIS — Z96653 Presence of artificial knee joint, bilateral: Secondary | ICD-10-CM | POA: Diagnosis not present

## 2019-08-29 DIAGNOSIS — Y929 Unspecified place or not applicable: Secondary | ICD-10-CM | POA: Diagnosis not present

## 2019-08-29 DIAGNOSIS — X58XXXA Exposure to other specified factors, initial encounter: Secondary | ICD-10-CM | POA: Diagnosis not present

## 2019-08-29 DIAGNOSIS — Z79899 Other long term (current) drug therapy: Secondary | ICD-10-CM | POA: Diagnosis not present

## 2019-08-29 DIAGNOSIS — I10 Essential (primary) hypertension: Secondary | ICD-10-CM | POA: Insufficient documentation

## 2019-08-29 MED ORDER — ACETAMINOPHEN ER 650 MG PO TBCR: 650.0000 mg | EXTENDED_RELEASE_TABLET | Freq: Three times a day (TID) | ORAL | 0 refills | Status: DC | PRN

## 2019-08-29 MED ORDER — PROPOFOL 10 MG/ML IV BOLUS
1.0000 mg/kg | Freq: Once | INTRAVENOUS | Status: AC
  Administered 2019-08-29: 45 mg via INTRAVENOUS

## 2019-08-29 MED ORDER — PROPOFOL 10 MG/ML IV BOLUS
INTRAVENOUS | Status: AC
  Filled 2019-08-29: qty 20

## 2019-08-29 MED ORDER — KETAMINE HCL 50 MG/5ML IJ SOSY
1.0000 mg/kg | PREFILLED_SYRINGE | Freq: Once | INTRAMUSCULAR | Status: AC
  Administered 2019-08-29: 07:00:00 4.5 mg via INTRAVENOUS
  Filled 2019-08-29: qty 10

## 2019-08-29 MED ORDER — KETAMINE HCL 50 MG/5ML IJ SOSY
PREFILLED_SYRINGE | INTRAMUSCULAR | Status: AC
  Filled 2019-08-29: qty 5

## 2019-08-29 NOTE — Discharge Instructions (Addendum)
You were seen in the ER for hip dislocation.  Your hip was reduced without any difficulty.  Please read the instructions provided on both the hip dislocation and conscious sedation.  We recommend that you follow-up with orthopedist in 7 to 14 days.  Until then ensure that you are using the knee immobilizer at all times.  Prevent any unnecessary bending or sudden movement.  Your pain should improve over the next 24 hours.  You can take tylenol as needed for pain.

## 2019-08-29 NOTE — ED Provider Notes (Signed)
.  Sedation  Date/Time: 08/29/2019 7:40 AM Performed by: Wyvonnia Dusky, MD Authorized by: Wyvonnia Dusky, MD   Consent:    Consent obtained:  Verbal   Consent given by:  Patient   Risks discussed:  Allergic reaction, prolonged hypoxia resulting in organ damage, prolonged sedation necessitating reversal, dysrhythmia, nausea, vomiting, inadequate sedation and respiratory compromise necessitating ventilatory assistance and intubation Universal protocol:    Procedure explained and questions answered to patient or proxy's satisfaction: yes     Relevant documents present and verified: yes     Test results available and properly labeled: yes     Imaging studies available: yes     Required blood products, implants, devices, and special equipment available: yes     Site/side marked: yes     Immediately prior to procedure a time out was called: yes     Patient identity confirmation method:  Arm band and verbally with patient Indications:    Procedure performed:  Dislocation reduction   Procedure necessitating sedation performed by:  Physician performing sedation Pre-sedation assessment:    Time since last food or drink:  6 hours   ASA classification: class 2 - patient with mild systemic disease     Neck mobility: normal     Mallampati score:  II - soft palate, uvula, fauces visible   Pre-sedation assessments completed and reviewed: airway patency, cardiovascular function, hydration status, mental status, nausea/vomiting, pain level, respiratory function and temperature   Immediate pre-procedure details:    Reassessment: Patient reassessed immediately prior to procedure     Reviewed: vital signs     Verified: bag valve mask available, emergency equipment available, intubation equipment available, IV patency confirmed, oxygen available, reversal medications available and suction available   Procedure details (see MAR for exact dosages):    Preoxygenation:  Nasal cannula   Sedation:   Propofol and ketamine   Intended level of sedation: deep   Intra-procedure monitoring:  Blood pressure monitoring, continuous capnometry, frequent LOC assessments, continuous pulse oximetry, frequent vital sign checks and cardiac monitor   Intra-procedure events: hypoxia     Intra-procedure events comment:  Hypopnea with O2 saturation to 90%, improved with 2L [place]   Intra-procedure management:  Airway repositioning and supplemental oxygen   Total Provider sedation time (minutes):  45 Post-procedure details:    Post-sedation assessment completed:  08/29/2019 8:24 AM   Attendance: Constant attendance by certified staff until patient recovered     Recovery: Patient returned to pre-procedure baseline     Post-sedation assessments completed and reviewed: airway patency, cardiovascular function, hydration status, mental status, nausea/vomiting, pain level, respiratory function and temperature     Patient is stable for discharge or admission: yes     Patient tolerance:  Tolerated well, no immediate complications     Wyvonnia Dusky, MD 08/29/19 [phone]

## 2019-08-29 NOTE — ED Triage Notes (Signed)
Per EMS: Pt is coming from home with c/o fall. Patient attempted to stand from sitting on the couch and slid forward. Noticeable left hip shortening. Pt states this happened once ten years ago. Patients pain was 8/10 with movement before fentanyl and is now a 1/10.   EMS VITALS: BP 114/72 HR 62 RR 18 SPO2 94% RA TEMP 98.0  EMS MEDS:  120mcg fentanyl

## 2019-08-29 NOTE — ED Notes (Addendum)
Spoke with patient's husband and he will be here in about 15 min and will call his wife when he is here. Patient given crutches and ambulation practiced.

## 2019-08-29 NOTE — Sedation Documentation (Signed)
Hip in place by Kathrynn Humble, MD @ [phone]

## 2019-08-29 NOTE — ED Notes (Signed)
X-ray at bedside

## 2019-08-29 NOTE — ED Provider Notes (Signed)
[facility] DEPT Provider Note   CSN: [id number] Arrival date & time: 08/29/19  0440     History Chief Complaint  Patient presents with  . Fall    [name] is a 68 y.o. female.  HPI     68 year old female comes in a chief complaint of hip pain. She is status post bilateral hip replacement from about 15 years ago.  She reports that she was just trying to sit down and have coffee when her left hip popped out.  She started having severe pain and decided to come to the ED.  She had no fall and has no pain elsewhere.  Review of system is negative for any numbness or tingling.  Past Medical History:  Diagnosis Date  . Acid reflux   . Anemia    "during the time I was taking meloxicam"  . Arthritis   . Asthma    LOV  6/13  Dr Annamaria Boots EPIC/ clearance on chart  from 10/12  . Depression   . Dysrhythmia    PVC's with caffeine and anxiety  . Heart murmur    since childhood  . Hiatal hernia   . High cholesterol   . Hypertension    OV with clearance and note Dr Addison Lank 6/13 chart  . Osteopenia   . Osteoporosis   . Sleep apnea    no CPAP/ last study 9 yrs ago- "mild per patient"  . STD (sexually transmitted disease)    HSV2  . Stye 06/2009   eye    Patient Active Problem List   Diagnosis Date Noted  . Deviated septum 10/04/2016  . Nasal turbinate hypertrophy 10/04/2016  . Itchy eyes 09/13/2016  . 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 smear of cervix 01/24/2016  . Postop Hyponatremia 03/20/2012  . OA (osteoarthritis) of knee 03/18/2012  . Sinusitis, chronic 11/25/2008  . Obstructive sleep apnea 06/14/2007  . HYPERTENSION 06/14/2007  . Seasonal and perennial allergic rhinitis 06/14/2007  . Allergic asthma, mild persistent, uncomplicated [id number]  . GERD 06/14/2007    Past Surgical History:  Procedure Laterality Date  . bilateral hip replacement Bilateral 6/07, 9/07   right, then left  . BREAST  REDUCTION SURGERY    . CESAREAN SECTION    . FRACTURE SURGERY     Left leg-1977  . NASAL SEPTOPLASTY W/ TURBINOPLASTY Bilateral 07/19/2018   Procedure: NASAL SEPTOPLASTY WITH TURBINATE REDUCTION;  Surgeon: Jerrell Belfast, MD;  Location: [facility];  Service: ENT;  Laterality: Bilateral;  . NASAL SEPTUM SURGERY    . TOTAL KNEE ARTHROPLASTY  03/18/2012   Procedure: TOTAL KNEE ARTHROPLASTY;  Surgeon: Gearlean Alf, MD;  Location: [facility];  Service: Orthopedics;  Laterality: Left;  . TOTAL KNEE ARTHROPLASTY Right 07/23/2017   Procedure: RIGHT TOTAL KNEE ARTHROPLASTY;  Surgeon: Gaynelle Arabian, MD;  Location: [facility];  Service: Orthopedics;  Laterality: Right;     OB History    Gravida  1   Para  1   Term      Preterm      AB      Living  1     SAB      TAB      Ectopic      Multiple      Live Births              Family History  Problem Relation Age of Onset  . Heart disease Father   . Cancer Father   .  Hypertension Father   . Kidney Stones Father   . Arthritis Mother   . Hypertension Mother   . Pancreatic cancer Unknown   . Coronary artery disease Other   . Cancer Other   . Asthma Other   . Nephritis Daughter        glomerular    Social History   Tobacco Use  . Smoking status: Never Smoker  . Smokeless tobacco: Never Used  Substance Use Topics  . Alcohol use: Yes    Alcohol/week: 0.0 - 1.0 standard drinks  . Drug use: No    Home Medications Prior to Admission medications   Medication Sig Start Date End Date Taking? Authorizing Provider  albuterol (VENTOLIN HFA) 108 (90 Base) MCG/ACT inhaler INHALE 2 PUFFS INTO THE LUNGS EVERY 4 (FOUR) HOURS AS NEEDED FOR WHEEZING OR SHORTNESS OF BREATH. 03/27/19   Baird Lyons D, MD  amLODipine (NORVASC) 5 MG tablet Take 5 mg by mouth daily before breakfast.     [provider]  amoxicillin-clavulanate (AUGMENTIN) 875-125 MG tablet Take 1 tablet by mouth 2 (two) times daily. 08/21/19   Baird Lyons D, MD    atorvastatin (LIPITOR) 40 MG tablet Take 40 mg by mouth daily.    [provider]  azelastine (ASTELIN) 0.1 % nasal spray 1-2 puffs each nostril daily as needed 03/27/19   Baird Lyons D, MD  Calcium Carb-Cholecalciferol (CALCIUM + VITAMIN D3 PO) Take 1 tablet by mouth daily.    [provider]  cetirizine (ZYRTEC) 10 MG tablet Take 10 mg by mouth daily as needed for allergies.     [provider]  Cholecalciferol (VITAMIN D3) 5000 units CAPS Take 5,000 Units by mouth daily.    [provider]  cloNIDine (CATAPRES) 0.1 MG tablet Take 0.1 mg by mouth at bedtime.    [provider]  esomeprazole (NEXIUM) 40 MG capsule Take 40 mg by mouth daily before breakfast.     [provider]  Eszopiclone 3 MG TABS Take 3 mg at bedtime by mouth. Take immediately before bedtime     [provider]  fluticasone (FLONASE) 50 MCG/ACT nasal spray PLACE 2 SPRAYS INTO BOTH NOSTRILS DAILY AS NEEDED FOR ALLERGIES. 08/18/19   Baird Lyons D, MD  Fluticasone-Salmeterol (ADVAIR DISKUS) 100-50 MCG/DOSE AEPB Inhale 1 puff into the lungs 2 (two) times daily. 03/27/19   Baird Lyons D, MD  Iron-Vitamin C (IRON 100/C) 100-250 MG TABS Take by mouth.    [provider]  magnesium gluconate (MAGONATE) 500 MG tablet Take 500 mg at bedtime by mouth.     [provider]  methocarbamol (ROBAXIN) 500 MG tablet Take 1 tablet (500 mg total) every 6 (six) hours as needed by mouth for muscle spasms. Patient taking differently: Take 500 mg by mouth every 8 (eight) hours as needed for muscle spasms.  07/24/17   Perkins, Alexzandrew L, PA-C  montelukast (SINGULAIR) 10 MG tablet 1 daily 03/27/19   Baird Lyons D, MD  Multiple Vitamin (MULTI-VITAMINS) TABS Take 1 tablet by mouth daily.     [provider]  valACYclovir (VALTREX) 500 MG tablet Take 1 tablet (500 mg total) by mouth See admin instructions. TAKE 1 TABLET BY MOUTH EVERY DAY INCREASE TO  TWICE A DAY FOR 3 DAYS WITH SYMPTOMS AS NEEDED Patient taking differently: Take 500 mg by mouth daily.  05/17/18   Megan Salon, MD    Allergies    Demerol [meperidine], Meloxicam, and Nadolol  Review of Systems  Review of Systems  Constitutional: Positive for activity change.  Musculoskeletal: Positive for arthralgias.  Skin: Negative for wound.  Allergic/Immunologic: Negative for immunocompromised state.  Neurological: Negative for weakness, numbness and headaches.  Hematological: Does not bruise/bleed easily.    Physical Exam Updated Vital Signs BP (!) 165/90   Pulse 67   Temp 98 F (36.7 C) (Oral)   Resp 17   Ht 5\' 5"  (1.651 m)   Wt 88.5 kg   LMP 09/05/2003   SpO2 95%   BMI 32.45 kg/m   Physical Exam Vitals and nursing note reviewed.  Constitutional:      Appearance: She is well-developed.  HENT:     Head: Normocephalic and atraumatic.  Eyes:     Pupils: Pupils are equal, round, and reactive to light.  Cardiovascular:     Rate and Rhythm: Normal rate and regular rhythm.     Heart sounds: Normal heart sounds. No murmur.  Pulmonary:     Effort: Pulmonary effort is normal. No respiratory distress.  Abdominal:     General: There is no distension.     Palpations: Abdomen is soft.     Tenderness: There is no abdominal tenderness.  Musculoskeletal:        General: Tenderness and deformity present.     Cervical back: Neck supple.     Comments: Left lower extremity is shortened and there is tenderness over the hip.  Skin:    General: Skin is warm and dry.  Neurological:     Mental Status: She is alert and oriented to person, place, and time.     ED Results / Procedures / Treatments   Labs (all labs ordered are listed, but only abnormal results are displayed) Labs Reviewed - No data to display  EKG None  Radiology DG HIP UNILAT WITH PELVIS 2-3 VIEWS LEFT  Result Date: 08/29/2019 CLINICAL DATA:  68 year old female who felt her left prosthetic hip  dislocate, which has happened before. Subsequent pain. EXAM: DG HIP (WITH OR WITHOUT PELVIS) 2-3V LEFT COMPARISON:  CT Abdomen and Pelvis 06/14/2018. Left hip series 04/24/2010. FINDINGS: AP views of the pelvis, left hip, and cross-table lateral view of the left hip. Confirmed superior dislocation of the left femoral head component from the acetabular component of the left total hip arthroplasty. Appearance is similar to the dislocation in 2011. No associated bone or hardware fracture is identified. The contralateral right total hip arthroplasty appears stable. Pelvis appears intact. Negative visible bowel gas pattern. IMPRESSION: 1. 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 2. No associated bone or hardware fracture identified. Electronically Signed   By: Genevie Ann M.D.   On: 08/29/2019 06:25    Procedures Reduction of dislocation  Date/Time: 08/29/2019 7:39 AM Performed by: Varney Biles, MD Authorized by: Varney Biles, MD  Consent: Written consent obtained. Risks and benefits: risks, benefits and alternatives were discussed Consent given by: patient Patient understanding: patient states understanding of the procedure being performed Patient consent: the patient's understanding of the procedure matches consent given Procedure consent: procedure consent matches procedure scheduled Relevant documents: relevant documents present and verified Test results: test results available and properly labeled Site marked: the operative site was marked Imaging studies: imaging studies available Patient identity confirmed: arm band Time out: Immediately prior to procedure a "time out" was called to verify the correct patient, procedure, equipment, support staff and site/side marked as required. Preparation: Patient was prepped and draped in the usual sterile fashion.  Patient tolerance: patient tolerated the procedure well with no immediate  complications    (including critical care time)  Medications Ordered in ED Medications  propofol (DIPRIVAN) 10 mg/mL bolus/IV push 88.5 mg (has no administration in time range)  ketamine 50 mg in normal saline 5 mL (10 mg/mL) syringe (has no administration in time range)  propofol (DIPRIVAN) 10 mg/mL bolus/IV push (has no administration in time range)  ketamine HCl 50 MG/5ML SOSY (has no administration in time range)    ED Course  I have reviewed the triage vital signs and the nursing notes.  Pertinent labs & imaging results that were available during my care of the patient were reviewed by me and considered in my medical decision making (see chart for details).    MDM Rules/Calculators/A&P                       68 year old with bilateral hip replacement comes in with chief complaint of hip pain.  She is noted to have superior dislocation of the left hip. Neurovascularly she is intact. No other trauma, no other radiographs or imaging needed.  Procedural sedation was completed by Dr. Langston Masker. No complications with joint reduction. She will be discharged when she has recovered from procedural sedation.  Final Clinical Impression(s) / ED Diagnoses Final diagnoses:  Dislocation of left hip, initial encounter [facility])    Rx / DC Orders ED Discharge Orders    None       Varney Biles, MD 08/29/19 0740

## 2019-08-29 NOTE — ED Notes (Signed)
2 L [place] applied and patient stimulated to increase decreased respiratory rate and low CO2.

## 2019-08-29 NOTE — Sedation Documentation (Signed)
Ortho tech applied the knee immobilizer to L knee after reduction of L hip.

## 2019-09-16 ENCOUNTER — Other Ambulatory Visit: Payer: Self-pay

## 2019-09-16 NOTE — Progress Notes (Signed)
69 y.o. G1P1 Married White or Caucasian female here for annual exam.  Doing well.  Had septoplasty 11/19.  She has OSA and is followed by Dr. Annamaria Boots.  Does have follow up with Dr. Annamaria Boots scheduled.    Denies vaginal bleeding.    Had hip dislocation at 3 am on Christmas morning after standing up.  Didn't do anything out of the ordinary.  Had to go to the ER to have it replaced under anesthesia.   Patient's last menstrual period was 09/05/2003.          Sexually active: No.  The current method of family planning is post menopausal status.    Exercising: Yes.    Home exercise routine includes yoga and walking. Smoker:  no  Health Maintenance: Pap:  01/24/16 Neg. HR HPV:neg  History of abnormal Pap:  Yes, LGSIL with CIN 1 biopsy 2015 MMG: 05/21/19- 2 Benign   05/15/18, normal Colonoscopy:  03/26/2019. F/u 3 years  BMD:   08/19/14 Osteoporosis.  On prolia.  Followed by Dr. Chalmers Cater.   TDaP:  2019 Pneumonia vaccine(s):  done Shingrix:   Completed  Hep C testing: 08/20/13 Neg  Screening Labs: PCP   reports that she has never smoked. She has never used smokeless tobacco. She reports current alcohol use. She reports that she does not use drugs.  Past Medical History:  Diagnosis Date  . Acid reflux   . Anemia    "during the time I was taking meloxicam"  . Arthritis   . Asthma    LOV  6/13  Dr Annamaria Boots EPIC/ clearance on chart  from 10/12  . Depression   . Dysrhythmia    PVC's with caffeine and anxiety  . Heart murmur    since childhood  . Hiatal hernia   . High cholesterol   . Hypertension    OV with clearance and note Dr Addison Lank 6/13 chart  . Osteopenia   . Osteoporosis   . Sleep apnea    no CPAP/ last study 9 yrs ago- "mild per patient"  . STD (sexually transmitted disease)    HSV2  . Stye 06/2009   eye    Past Surgical History:  Procedure Laterality Date  . bilateral hip replacement Bilateral 6/07, 9/07   right, then left  . BREAST REDUCTION SURGERY    . CESAREAN SECTION    .  FRACTURE SURGERY     Left leg-1977  . NASAL SEPTOPLASTY W/ TURBINOPLASTY Bilateral 07/19/2018   Procedure: NASAL SEPTOPLASTY WITH TURBINATE REDUCTION;  Surgeon: Jerrell Belfast, MD;  Location: [facility];  Service: ENT;  Laterality: Bilateral;  . NASAL SEPTUM SURGERY    . TOTAL KNEE ARTHROPLASTY  03/18/2012   Procedure: TOTAL KNEE ARTHROPLASTY;  Surgeon: Gearlean Alf, MD;  Location: [facility];  Service: Orthopedics;  Laterality: Left;  . TOTAL KNEE ARTHROPLASTY Right 07/23/2017   Procedure: RIGHT TOTAL KNEE ARTHROPLASTY;  Surgeon: Gaynelle Arabian, MD;  Location: [facility];  Service: Orthopedics;  Laterality: Right;    Current Outpatient Medications  Medication Sig Dispense Refill  . acetaminophen (TYLENOL 8 HOUR) 650 MG CR tablet Take 1 tablet (650 mg total) by mouth every 8 (eight) hours as needed. 30 tablet 0  . albuterol (VENTOLIN HFA) 108 (90 Base) MCG/ACT inhaler INHALE 2 PUFFS INTO THE LUNGS EVERY 4 (FOUR) HOURS AS NEEDED FOR WHEEZING OR SHORTNESS OF BREATH. 54 g 3  . amLODipine (NORVASC) 5 MG tablet Take 5 mg by mouth daily before breakfast.     .  atorvastatin (LIPITOR) 40 MG tablet atorvastatin 40 mg tablet    . azelastine (ASTELIN) 0.1 % nasal spray 1-2 puffs each nostril daily as needed 90 mL 3  . Calcium Carb-Cholecalciferol (CALCIUM + VITAMIN D3 PO) Take 1 tablet by mouth daily.    . cetirizine (ZYRTEC) 10 MG tablet Take 10 mg by mouth daily as needed for allergies.     . Cholecalciferol (VITAMIN D3) 5000 units CAPS Take 5,000 Units by mouth daily.    . cloNIDine (CATAPRES) 0.1 MG tablet Take 0.1 mg by mouth at bedtime.    Marland Kitchen esomeprazole (NEXIUM) 40 MG capsule Nexium 40 mg capsule,delayed release   40 mg by oral route.    . Eszopiclone 3 MG TABS Take 3 mg at bedtime by mouth. Take immediately before bedtime     . fluticasone (FLONASE) 50 MCG/ACT nasal spray fluticasone propionate 50 mcg/actuation nasal spray,suspension    . Fluticasone-Salmeterol (ADVAIR DISKUS) 100-50 MCG/DOSE AEPB  Inhale 1 puff into the lungs 2 (two) times daily. 180 each 3  . Influenza vac split quadrivalent PF (FLUZONE HIGH-DOSE) 0.5 ML injection Fluzone High-Dose 2019-20 (PF) 180 mcg/0.5 mL intramuscular syringe  TO BE ADMINISTERED BY PHARMACIST FOR IMMUNIZATION    . magnesium gluconate (MAGONATE) 500 MG tablet Take 500 mg at bedtime by mouth.     . methocarbamol (ROBAXIN) 500 MG tablet Take 1 tablet (500 mg total) every 6 (six) hours as needed by mouth for muscle spasms. (Patient taking differently: Take 500 mg by mouth every 8 (eight) hours as needed for muscle spasms. ) 80 tablet 0  . mometasone (NASONEX) 50 MCG/ACT nasal spray mometasone 50 mcg/actuation nasal spray    . montelukast (SINGULAIR) 10 MG tablet 1 daily 90 tablet 3  . Multiple Vitamin (MULTI-VITAMINS) TABS Take 1 tablet by mouth daily.     . valACYclovir (VALTREX) 500 MG tablet Take 1 tablet (500 mg total) by mouth See admin instructions. TAKE 1 TABLET BY MOUTH EVERY DAY INCREASE TO TWICE A DAY FOR 3 DAYS WITH SYMPTOMS AS NEEDED (Patient taking differently: Take 500 mg by mouth daily. ) 45 tablet 12  . Zoster Vaccine Adjuvanted [facility]) injection Shingrix (PF) 50 mcg/0.5 mL intramuscular suspension, kit     No current facility-administered medications for this visit.    Family History  Problem Relation Age of Onset  . Heart disease Father   . Cancer Father   . Hypertension Father   . Kidney Stones Father   . Arthritis Mother   . Hypertension Mother   . Pancreatic cancer Other   . Coronary artery disease Other   . Cancer Other   . Asthma Other   . Nephritis Daughter        glomerular    Review of Systems  All other systems reviewed and are negative.   Exam:   BP 130/75 (BP Location: Right Arm, Patient Position: Sitting, Cuff Size: Large)   Pulse 80   Temp 97.9 F (36.6 C) (Temporal)   Ht 5' 3.5" (1.613 m)   Wt 204 lb 6.4 oz (92.7 kg)   LMP 09/05/2003   BMI 35.64 kg/m    Height: 5' 3.5" (161.3 cm)  Ht Readings  from Last 3 Encounters:  09/18/19 5' 3.5" (1.613 m)  08/29/19 '5\' 5"'  (1.651 m)  03/27/19 '5\' 6"'  (1.676 m)    General appearance: alert, cooperative and appears stated age Head: Normocephalic, without obvious abnormality, atraumatic Neck: no adenopathy, supple, symmetrical, trachea midline and thyroid normal to inspection and palpation  Lungs: clear to auscultation bilaterally Breasts: normal appearance, no masses or tenderness Heart: regular rate and rhythm Abdomen: soft, non-tender; bowel sounds normal; no masses,  no organomegaly Extremities: extremities normal, atraumatic, no cyanosis or edema Skin: Skin color, texture, turgor normal. No rashes or lesions Lymph nodes: Cervical, supraclavicular, and axillary nodes normal. No abnormal inguinal nodes palpated Neurologic: Grossly normal   Pelvic: External genitalia:  no lesions              Urethra:  normal appearing urethra with no masses, tenderness or lesions              Bartholins and Skenes: normal                 Vagina: normal appearing vagina with normal color and discharge, no lesions              Cervix: no lesions              Pap taken: Yes.   Bimanual Exam:  Uterus:  normal size, contour, position, consistency, mobility, non-tender              Adnexa: normal adnexa and no mass, fullness, tenderness               Rectovaginal: Confirms               Anus:  normal sphincter tone, no lesions  Chaperone, Evern Core, RN, was present for exam.  A:  Well Woman with normal exam PMP, no HRT Osteoporosis, followed by Dr. Chalmers Cater H/o elevated lipids Hypertension H/o LGSIL with CIN 1 in 2015  P:   Mammogram guidelines reviewed.  Doing yearly.   Pap and HR HPV obtained today RF for valtrex 524m daily and then BID x 3 days with symptoms.  #45/12RF Blood work done with Dr. MAddison Lank  Has appt in April.  Colonoscopy is due again in 3 years with Dr. MCollene MaresBMD will be done with Dr. BChalmers CaterReturn annually or prn

## 2019-09-18 ENCOUNTER — Encounter: Payer: Self-pay | Admitting: Obstetrics & Gynecology

## 2019-09-18 ENCOUNTER — Other Ambulatory Visit: Payer: Self-pay

## 2019-09-18 ENCOUNTER — Ambulatory Visit (INDEPENDENT_AMBULATORY_CARE_PROVIDER_SITE_OTHER): Payer: Medicare PPO | Admitting: Obstetrics & Gynecology

## 2019-09-18 ENCOUNTER — Other Ambulatory Visit (HOSPITAL_COMMUNITY)
Admission: RE | Admit: 2019-09-18 | Discharge: 2019-09-18 | Disposition: A | Discharge status: Home / Self Care | Payer: Medicare PPO | Source: Ambulatory Visit | Attending: Obstetrics & Gynecology | Admitting: Obstetrics & Gynecology

## 2019-09-18 VITALS — BP 130/75 | HR 80 | Temp 97.9°F | Ht 63.5 in | Wt 204.4 lb

## 2019-09-18 DIAGNOSIS — Z124 Encounter for screening for malignant neoplasm of cervix: Secondary | ICD-10-CM | POA: Diagnosis present

## 2019-09-18 DIAGNOSIS — Z01419 Encounter for gynecological examination (general) (routine) without abnormal findings: Secondary | ICD-10-CM | POA: Diagnosis not present

## 2019-09-18 DIAGNOSIS — B977 Papillomavirus as the cause of diseases classified elsewhere: Secondary | ICD-10-CM | POA: Insufficient documentation

## 2019-09-18 DIAGNOSIS — Z1151 Encounter for screening for human papillomavirus (HPV): Secondary | ICD-10-CM | POA: Diagnosis not present

## 2019-09-18 MED ORDER — VALACYCLOVIR HCL 500 MG PO TABS: 500.0000 mg | ORAL_TABLET | ORAL | 12 refills | Status: DC

## 2019-09-19 LAB — CYTOLOGY - PAP
Comment: NEGATIVE
Diagnosis: NEGATIVE
High risk HPV: NEGATIVE

## 2019-09-27 ENCOUNTER — Ambulatory Visit: Payer: Medicare PPO | Attending: Internal Medicine

## 2019-09-27 DIAGNOSIS — Z23 Encounter for immunization: Secondary | ICD-10-CM

## 2019-09-27 NOTE — Progress Notes (Signed)
   Covid-19 Vaccination Clinic  Name:  [name]    MRN: [id number] DOB: [birth year]  09/27/2019  [name] was observed post Covid-19 immunization for 15 minutes without incidence. She was provided with Vaccine Information Sheet and instruction to access the V-Safe system.   [name] was instructed to call 911 with any severe reactions post vaccine: Marland Kitchen Difficulty breathing  . Swelling of your face and throat  . A fast heartbeat  . A bad rash all over your body  . Dizziness and weakness    Immunizations Administered    Name Date Dose VIS Date Route   Pfizer COVID-19 Vaccine 09/27/2019  2:39 PM 0.3 mL 08/15/2019 Intramuscular   Manufacturer: [place]   Lot: [id number]   [place]: [id number]

## 2019-10-20 ENCOUNTER — Ambulatory Visit: Payer: Medicare PPO | Attending: Internal Medicine

## 2019-10-20 DIAGNOSIS — Z23 Encounter for immunization: Secondary | ICD-10-CM | POA: Insufficient documentation

## 2019-10-20 NOTE — Progress Notes (Signed)
   Covid-19 Vaccination Clinic  Name:  [name]    MRN: [id number] DOB: 1951-04-23  10/20/2019  [name] was observed post Covid-19 immunization for 15 minutes without incidence. She was provided with Vaccine Information Sheet and instruction to access the V-Safe system.   [name] was instructed to call 911 with any severe reactions post vaccine: Marland Kitchen Difficulty breathing  . Swelling of your face and throat  . A fast heartbeat  . A bad rash all over your body  . Dizziness and weakness    Immunizations Administered    Name Date Dose VIS Date Route   Pfizer COVID-19 Vaccine 10/20/2019 12:39 PM 0.3 mL 08/15/2019 Intramuscular   Manufacturer: [place]   Lot: [id number]   [place]: [id number]

## 2019-10-26 ENCOUNTER — Ambulatory Visit: Payer: Medicare Other

## 2019-11-19 ENCOUNTER — Other Ambulatory Visit: Payer: Self-pay | Admitting: Internal Medicine

## 2020-04-01 ENCOUNTER — Encounter: Payer: Self-pay | Admitting: Internal Medicine

## 2020-04-01 ENCOUNTER — Other Ambulatory Visit: Payer: Self-pay

## 2020-04-01 ENCOUNTER — Ambulatory Visit: Payer: Medicare PPO | Admitting: Internal Medicine

## 2020-04-01 DIAGNOSIS — J4530 Mild persistent asthma, uncomplicated: Secondary | ICD-10-CM

## 2020-04-01 DIAGNOSIS — G4733 Obstructive sleep apnea (adult) (pediatric): Secondary | ICD-10-CM

## 2020-04-01 DIAGNOSIS — J302 Other seasonal allergic rhinitis: Secondary | ICD-10-CM

## 2020-04-01 DIAGNOSIS — J3089 Other allergic rhinitis: Secondary | ICD-10-CM

## 2020-04-01 MED ORDER — ALBUTEROL SULFATE HFA 108 (90 BASE) MCG/ACT IN AERS: INHALATION_SPRAY | RESPIRATORY_TRACT | 3 refills | Status: DC

## 2020-04-01 MED ORDER — TRELEGY ELLIPTA 100-62.5-25 MCG/INH IN AEPB: 1.0000 | INHALATION_SPRAY | Freq: Every day | RESPIRATORY_TRACT | 0 refills | Status: DC

## 2020-04-01 MED ORDER — MONTELUKAST SODIUM 10 MG PO TABS: ORAL_TABLET | ORAL | 3 refills | Status: DC

## 2020-04-01 MED ORDER — AMOXICILLIN-POT CLAVULANATE 875-125 MG PO TABS: 1.0000 | ORAL_TABLET | Freq: Two times a day (BID) | ORAL | 1 refills | Status: DC

## 2020-04-01 MED ORDER — AZELASTINE HCL 0.1 % NA SOLN: NASAL | 3 refills | Status: DC

## 2020-04-01 MED ORDER — FLUTICASONE PROPIONATE 50 MCG/ACT NA SUSP: 2.0000 | Freq: Every evening | NASAL | 3 refills | Status: DC

## 2020-04-01 NOTE — Progress Notes (Signed)
Patient ID: [name], female    DOB: 1951/07/10, 69 y.o.   MRN: [id number]  HPI  F never smoker folowed for asthma, allergic rhinitis, complicated by hx OSA/ oral appliance, HBP, GERD PFT 05/30/10- FEV1 2.56/ 91%, FEV1/FVC 0.73, DLCO 84%  within normal limits. NPSG-04/21/14- Moderate obstructive sleep apnea, AHI 21/ hr, CPAP to 14, weight 185 lbs  -----------------------------------------------------------------  03/27/2019- 69 year old female never smoker followed for asthma, allergic rhinitis, OSA-failedCPAP/ oral appliance, complicated by HBP, GERD Singulair, Advair 100-50, albuterol HFA, Lunesta Had septoplasty/ turbinate reduction in November- [place]. Still gets nasal congestion occ at night.  -----pt states she's having trouble exercising & feels as if her asthma is generally "worse" d/t not being able to go outside and exercise; inquiring if she needs to be back on CPAP Body weight today- 202 lbs Ventolin hfa, Advair 100, Lunesta 3mg , Flonase Aware of snoring.   04/01/20- 69 year old female never smoker followed for Asthma, Allergic Rhinitis, OSA-failedCPAP/ oral appliance, complicated by HBP, GERD Singulair, Advair 100-50, albuterol HFA, Lunesta Hip dislocation at Christmas- anesthesia for reduction ------   pt needs to be scheduled for sleep study Body weight today 202 lbs Had 2 phizer Covax She had septoplasty hoping to avoid need for CPAP. Still some stuffy nose at night. Often some daytime tiredness. With discussion, she decided she wants to wait for now on another sleep study. Asthma control good. Infrequent need for rescue inhaler.  Review of Systems- see HPI  + = positive Constitutional:   +  weight loss- see HPI, night sweats, fevers, chills, +fatigue, lassitude. HEENT:   +  headaches, difficulty swallowing, tooth/dental problems, sore throat,       No-  sneezing, itching, ear ache, +nasal congestion, post nasal drip,  CV:  No-   chest pain, orthopnea, PND,  swelling in lower extremities, anasarca,  dizziness, palpitations Resp: No-   shortness of breath with exertion or at rest.              No-   productive cough,  No non-productive cough,  No- coughing up of blood.              No-   change in color of mucus. +wheezing.   Skin: No-   rash or lesions. GI:  +heartburn, indigestion, abdominal pain, nausea, vomiting, GU: . MS:  See HPI-  joint pain or swelling.   Neuro-     nothing unusual Psych:  No- change in mood or affect. No depression or anxiety.  No memory loss.  OBJ General- Alert, Oriented, Affect-appropriate, Distress- none acute. + Overweight Skin- rash-none, lesions- none, excoriation- none Lymphadenopathy- none Head- atraumatic            Eyes- Gross vision intact, PERRLA, conjunctivae + lightly injected right with clear secretion            Ears- Hearing, canals-normal            Nose- mucus, no-polyps, erosion, perforation             Throat- Mallampati III , mucosa clear , drainage- none, tonsils- atrophic, own teeth Neck- flexible , trachea midline, no stridor , thyroid nl, carotid no bruit Chest - symmetrical excursion , unlabored           Heart/CV- RRR , no murmur , no gallop  , no rub, nl s1 s2                           -  JVD- none , edema- none, stasis changes- none, varices- none           Lung-  Wheeze+ trace R scapula, cough- none , dullness-none, rub- none           Chest wall-  Abd-  Br/ Gen/ Rectal- Not done, not indicated Extrem- cyanosis- none, clubbing, none, atrophy- none, strength- nl Neuro- grossly intact to observation

## 2020-04-01 NOTE — Patient Instructions (Signed)
We agreed to wait on a sleep study for now.   Sample x 2 Trelegy 100    Inhale 1 puff then rinse mouth, once daily Try this instead of Advair. If you like Trelegy better, then let us know and we can send a script.   For your nose- Try otc antihistamine Chlorpheniramine (used to be Chlortrimeton). This may work a little better, but may be a little more sedating than Zyrtec. Try taking it at bedtime if it makes you drowsy.                            Try otc nasal spray Nasalcrom/ Cromol/ cromolyn. Follow directions on box. You can use this instead of or in addition to your other nasal sprays.    Script sent for augmentin

## 2020-04-13 NOTE — Assessment & Plan Note (Addendum)
Has had sinus surgery. No evident polyps. Plan- try chlortrimeton and nasalcrom

## 2020-04-13 NOTE — Assessment & Plan Note (Signed)
Snoring and feeling tired, but she decided she didn't want to pursue this issue for now, after discussion. We will come back to it. Continue efforts at weight loss, sleep off back.

## 2020-04-13 NOTE — Assessment & Plan Note (Signed)
Fairly good control with little need for rescue, some cough. Plan- Try samples of Trelegy for comparison.

## 2020-04-20 ENCOUNTER — Telehealth: Payer: Self-pay | Admitting: Internal Medicine

## 2020-04-20 MED ORDER — TRELEGY ELLIPTA 100-62.5-25 MCG/INH IN AEPB: 1.0000 | INHALATION_SPRAY | Freq: Every day | RESPIRATORY_TRACT | 2 refills | Status: DC

## 2020-04-20 NOTE — Telephone Encounter (Signed)
Called and spoke with Patient.  Patient stated she would like a prescription sent to [facility].  Patient stated she could tell some difference in Trelegy vs Advair,and wanted to continue Trelegy. Trelegy prescription sent to requested pharmacy.  Nothing further at this time.

## 2020-05-19 ENCOUNTER — Other Ambulatory Visit: Payer: Self-pay | Admitting: Internal Medicine

## 2020-05-26 DIAGNOSIS — Z1231 Encounter for screening mammogram for malignant neoplasm of breast: Secondary | ICD-10-CM | POA: Diagnosis not present

## 2020-06-02 ENCOUNTER — Encounter: Payer: Self-pay | Admitting: Obstetrics & Gynecology

## 2020-06-08 DIAGNOSIS — M810 Age-related osteoporosis without current pathological fracture: Secondary | ICD-10-CM | POA: Diagnosis not present

## 2020-06-17 DIAGNOSIS — L821 Other seborrheic keratosis: Secondary | ICD-10-CM | POA: Diagnosis not present

## 2020-06-17 DIAGNOSIS — L578 Other skin changes due to chronic exposure to nonionizing radiation: Secondary | ICD-10-CM | POA: Diagnosis not present

## 2020-06-17 DIAGNOSIS — D485 Neoplasm of uncertain behavior of skin: Secondary | ICD-10-CM | POA: Diagnosis not present

## 2020-06-17 DIAGNOSIS — L814 Other melanin hyperpigmentation: Secondary | ICD-10-CM | POA: Diagnosis not present

## 2020-06-17 DIAGNOSIS — D225 Melanocytic nevi of trunk: Secondary | ICD-10-CM | POA: Diagnosis not present

## 2020-06-17 DIAGNOSIS — L920 Granuloma annulare: Secondary | ICD-10-CM | POA: Diagnosis not present

## 2020-06-17 DIAGNOSIS — L603 Nail dystrophy: Secondary | ICD-10-CM | POA: Diagnosis not present

## 2020-06-17 DIAGNOSIS — D2339 Other benign neoplasm of skin of other parts of face: Secondary | ICD-10-CM | POA: Diagnosis not present

## 2020-06-21 DIAGNOSIS — R7309 Other abnormal glucose: Secondary | ICD-10-CM | POA: Diagnosis not present

## 2020-06-21 DIAGNOSIS — D509 Iron deficiency anemia, unspecified: Secondary | ICD-10-CM | POA: Diagnosis not present

## 2020-06-21 DIAGNOSIS — Z0000 Encounter for general adult medical examination without abnormal findings: Secondary | ICD-10-CM | POA: Diagnosis not present

## 2020-06-21 DIAGNOSIS — I10 Essential (primary) hypertension: Secondary | ICD-10-CM | POA: Diagnosis not present

## 2020-06-21 DIAGNOSIS — Z79899 Other long term (current) drug therapy: Secondary | ICD-10-CM | POA: Diagnosis not present

## 2020-06-21 DIAGNOSIS — E559 Vitamin D deficiency, unspecified: Secondary | ICD-10-CM | POA: Diagnosis not present

## 2020-06-21 DIAGNOSIS — K219 Gastro-esophageal reflux disease without esophagitis: Secondary | ICD-10-CM | POA: Diagnosis not present

## 2020-06-21 DIAGNOSIS — M810 Age-related osteoporosis without current pathological fracture: Secondary | ICD-10-CM | POA: Diagnosis not present

## 2020-06-21 DIAGNOSIS — E782 Mixed hyperlipidemia: Secondary | ICD-10-CM | POA: Diagnosis not present

## 2020-06-24 DIAGNOSIS — E559 Vitamin D deficiency, unspecified: Secondary | ICD-10-CM | POA: Diagnosis not present

## 2020-06-24 DIAGNOSIS — D509 Iron deficiency anemia, unspecified: Secondary | ICD-10-CM | POA: Diagnosis not present

## 2020-06-24 DIAGNOSIS — Z0000 Encounter for general adult medical examination without abnormal findings: Secondary | ICD-10-CM | POA: Diagnosis not present

## 2020-06-24 DIAGNOSIS — K219 Gastro-esophageal reflux disease without esophagitis: Secondary | ICD-10-CM | POA: Diagnosis not present

## 2020-06-24 DIAGNOSIS — E782 Mixed hyperlipidemia: Secondary | ICD-10-CM | POA: Diagnosis not present

## 2020-06-24 DIAGNOSIS — M810 Age-related osteoporosis without current pathological fracture: Secondary | ICD-10-CM | POA: Diagnosis not present

## 2020-06-24 DIAGNOSIS — I10 Essential (primary) hypertension: Secondary | ICD-10-CM | POA: Diagnosis not present

## 2020-06-24 DIAGNOSIS — R7309 Other abnormal glucose: Secondary | ICD-10-CM | POA: Diagnosis not present

## 2020-06-24 DIAGNOSIS — Z79899 Other long term (current) drug therapy: Secondary | ICD-10-CM | POA: Diagnosis not present

## 2020-08-11 DIAGNOSIS — E559 Vitamin D deficiency, unspecified: Secondary | ICD-10-CM | POA: Diagnosis not present

## 2020-08-11 DIAGNOSIS — I10 Essential (primary) hypertension: Secondary | ICD-10-CM | POA: Diagnosis not present

## 2020-08-11 DIAGNOSIS — M810 Age-related osteoporosis without current pathological fracture: Secondary | ICD-10-CM | POA: Diagnosis not present

## 2020-09-17 DIAGNOSIS — Z96642 Presence of left artificial hip joint: Secondary | ICD-10-CM | POA: Diagnosis not present

## 2020-09-20 ENCOUNTER — Encounter: Payer: Medicare PPO | Attending: Family Medicine | Admitting: Dietician

## 2020-09-20 DIAGNOSIS — R7303 Prediabetes: Secondary | ICD-10-CM | POA: Diagnosis not present

## 2020-09-20 NOTE — Progress Notes (Signed)
On 09/20/2020 patient completed Core Session 1 of Diabetes Prevention Program course virtually with [facility]. The following learning objectives were met by the patient during this class:   Virtual Visit via Video Note  I connected with [name], DOB: 1951/08/06 by a video enabled application and verified that I am speaking with the correct person using two identifiers.  Location: Patient: Virtual  Provider: Office    Learning Objectives:   Be able to explain the purpose and benefits of the National Diabetes Prevention Program.   Be able to describe the events that will take place at every session.   Know the weight loss and physical activity goals established by the [facility] Diabetes Prevention Program.   Know their own individual weight loss and physical activity goals.   Be able to explain the important effect of self-monitoring on behavior change.   Goals:  . Record food and beverage intake in "Food and Activity Tracker" over the next week.  . E-mail completed "Food and Activity Tracker" to Lifestyle Coach next week before session 2. . Circle the foods or beverages you think are highest in fat and calories in your food tracker. . Read the labels on the food you buy, and consider using measuring cups and spoons to help you calculate the amount you eat. We will talk about measuring in more detail in the coming weeks.   Follow-Up Plan:  Attend Core Session 2 next week.   E-mail completed "Food and Activity Tracker" to Lifestyle Coach next week before class.

## 2020-09-27 ENCOUNTER — Encounter (HOSPITAL_BASED_OUTPATIENT_CLINIC_OR_DEPARTMENT_OTHER): Payer: Medicare PPO | Admitting: Dietician

## 2020-09-27 DIAGNOSIS — R7303 Prediabetes: Secondary | ICD-10-CM | POA: Diagnosis not present

## 2020-09-27 NOTE — Progress Notes (Signed)
On 09/27/2020 patient completed Core Session 2 of Diabetes Prevention Program course virtually with [facility]. The following learning objectives were met by the patient during this class:   I connected with Tiburcio Bash. [name], [birth year] by a video enabled application and verified that I am speaking with the correct person using two identifiers.  Location: Patient: Virtual  Provider: Office  Learning Objectives:  Self-monitor their weight during the weeks following Session 2.   Describe the relationship between fat and calories.   Explain the reason for, and basic principles of, self-monitoring fat grams and calories.   Identify their personal fat gram goals.   Use the ?Fat and Calorie Counter to calculate the calories and fat grams of a given selection of foods.   Keep a running total of the fat grams they eat each day.   Calculate fat, calories, and serving sizes from nutrition labels.   Goals:   Weigh yourself at the same time each day, or every few days, and record your weight in your Food and Activity Tracker.  Write down everything you eat and drink in your Food and Activity Tracker.  Measure portions as much as you can, and start reading labels.   Use the ?Fat and Calorie Counter to figure out the amount of fat and calories in what you ate, and write the amount down in your Food and Activity Tracker.  Keep a running fat gram total throughout the day. Come as close to your fat gram goal as you can.   Follow-Up Plan:  Attend Core Session 3 next week.   Email completed  "Food and Activity Tracker" to Lifestyle Coach next week.

## 2020-10-04 ENCOUNTER — Encounter (HOSPITAL_BASED_OUTPATIENT_CLINIC_OR_DEPARTMENT_OTHER): Payer: Medicare PPO | Admitting: Dietician

## 2020-10-04 DIAGNOSIS — R7303 Prediabetes: Secondary | ICD-10-CM | POA: Diagnosis not present

## 2020-10-04 NOTE — Progress Notes (Signed)
On 10/04/2020 patient completed Core Session 3 of Diabetes Prevention Program course virtually with [facility]. The following learning objectives were met by the patient during this class:   I connected with Tiburcio Bash. [name], 01-17-1951 by a video enabled application and verified that I am speaking with the correct person using two identifiers.  Location: Patient: Virtual Provider: Office  Learning Objectives:  Weigh and measure foods.  Estimate the fat and calorie content of common foods.  Describe three ways to eat less fat and fewer calories.  Create a plan to eat less fat for the following week.   Goals:   Track weight when weighing outside of class.   Track food and beverages eaten each day in Food and Activity Tracker and include fat grams and calories for each.   Try to stay within fat gram goal.   Complete plan for eating less high fat foods and answer related homework questions.    Follow-Up Plan:  Attend Core Session 4 next week.   Bring completed "Food and Activity Tracker" next week to be reviewed by Lifestyle Coach.

## 2020-10-11 ENCOUNTER — Encounter: Payer: Medicare PPO | Attending: Family Medicine | Admitting: Dietician

## 2020-10-11 DIAGNOSIS — R7303 Prediabetes: Secondary | ICD-10-CM | POA: Insufficient documentation

## 2020-10-11 NOTE — Progress Notes (Signed)
On 10/11/2020 patient completed Core Session 4 of Diabetes Prevention Program course virtually with [facility]. The following learning objectives were met by the patient during this class:   I connected with [name]. [name], 1951-03-26 by a video enabled application and verified that I am speaking with the correct person using two identifiers.  Location: Patient: Virtual  Provider: Office  Learning Objectives:  Describe the MyPlate food guide and its recommendations, including how to reduce fat and calories in our diet.  Compare and contrast MyPlate guidelines with participants' eating habits.  List ways to replace high-fat and high-calorie foods with low-fat and low-calorie foods.  Explain the importance of eating plenty of whole grains, vegetables, and fruits, while staying within fat gram goals.  Explain the importance of eating foods from all groups of MyPlate and of eating a variety of foods from within each group.  Explain why a balanced diet is beneficial to health.  Goals:   Record weight taken outside of class.   Track foods and beverages eaten each day in the "Food and Activity Tracker," including calories and fat grams for each item.   Practice comparing what you eat with the recommendations of MyPlate using the "Rate Your Plate" handout.   Complete the "Rate Your Plate" handout form on at least 3 days.   Answer homework questions.   Follow-Up Plan:  Attend Core Session 5 next week.   Email completed "Food and Activity Tracker" next week to be reviewed by Lifestyle Coach.

## 2020-10-18 ENCOUNTER — Encounter (HOSPITAL_BASED_OUTPATIENT_CLINIC_OR_DEPARTMENT_OTHER): Payer: Medicare PPO | Admitting: Dietician

## 2020-10-18 DIAGNOSIS — R7303 Prediabetes: Secondary | ICD-10-CM | POA: Diagnosis not present

## 2020-10-18 NOTE — Progress Notes (Signed)
On 10/18/2020 patient completed Core Session 5 of Diabetes Prevention Program course virtually with [facility]. The following learning objectives were met by the patient during this class:   I connected with Tiburcio Bash. [name], 1950-12-15 by a video enabled application and verified that I am speaking with the correct person using two identifiers.  Location: Patient: Virtual Provider: Office  Learning Objectives:  Establish a physical activity goal.  Explain the importance of the physical activity goal.  Describe their current level of physical activity.  Name ways that they are already physically active.  Develop personal plans for physical activity for the next week.   Goals:   Record weight taken outside of class.   Track foods and beverages eaten each day in the "Food and Activity Tracker," including calories and fat grams for each item.   Make an Activity Plan including date, specific type of activity, and length of time you plan to be active that includes at last 60 minutes of activity for the week.   Track activity type, minutes you were active, and distance you reached each day in the "Food and Activity Tracker."   Follow-Up Plan: . Attend Core Session 6 next week.  . E-mail completed "Food and Activity Tracker" to Lifestyle Coach next week before class

## 2020-10-23 ENCOUNTER — Other Ambulatory Visit (HOSPITAL_BASED_OUTPATIENT_CLINIC_OR_DEPARTMENT_OTHER): Payer: Self-pay | Admitting: Obstetrics & Gynecology

## 2020-10-25 ENCOUNTER — Encounter (HOSPITAL_BASED_OUTPATIENT_CLINIC_OR_DEPARTMENT_OTHER): Payer: Medicare PPO | Admitting: Dietician

## 2020-10-25 DIAGNOSIS — R7303 Prediabetes: Secondary | ICD-10-CM | POA: Diagnosis not present

## 2020-10-25 NOTE — Progress Notes (Signed)
On 10/25/2020 patient completed Core Session 6 of Diabetes Prevention Program course virtually with [facility]. The following learning objectives were met by the patient during this class:   I connected with Tiburcio Bash. [name] Jun 02, 1951 by a video enabled application and verified that I am speaking with the correct person using two identifiers.  Location: Patient: Virtual Provider: Office  Learning Objectives:  Graph their daily physical activity.   Describe two ways of finding the time to be active.   Define "lifestyle activity."   Describe how to prevent injury.   Develop an activity plan for the coming week.   Goals:   Record weight taken outside of class.   Track foods and beverages eaten each day in the "Food and Activity Tracker," including calories and fat grams for each item.    Track activity type, minutes you were active, and distance you reached each day in the "Food and Activity Tracker."   Set aside one 20 to 30-minute block of time every day or find two or more periods of 10 to15 minutes each for physical activity.   Warm up, cool down, and stretch.  Make a Physical Activities Plan for the Week.   Follow-Up Plan:  Attend Core Session 7 next week.   E-mail completed "Food and Activity Tracker" to Lifestyle Coach next week before class

## 2020-10-28 NOTE — Telephone Encounter (Signed)
Medication refill request: Valtrex Last AEX:  09/18/19 Dr. Sabra Heck Next AEX: none scheduled  Last MMG (if hormonal medication request): n/a Refill authorized: today, please advise

## 2020-11-01 ENCOUNTER — Encounter (HOSPITAL_BASED_OUTPATIENT_CLINIC_OR_DEPARTMENT_OTHER): Payer: Medicare PPO | Admitting: Dietician

## 2020-11-01 DIAGNOSIS — R7303 Prediabetes: Secondary | ICD-10-CM

## 2020-11-01 NOTE — Progress Notes (Signed)
On 11/01/2020 patient completed Core Session 7 of Diabetes Prevention Program course virtually with [facility]. The following learning objectives were met by the patient during this class:   I connected with Tiburcio Bash. [name] 08/24/1951 by a video enabled application and verified that I am speaking with the correct person using two identifiers.  Location: Patient: Virtual Provider: Office  Learning Objectives:  Define calorie balance.  Explain how healthy eating and being active are related in terms of calorie balance.   Describe the relationship between calorie balance and weight loss.   Describe his or her progress as it relates to calorie balance.   Develop an activity plan for the coming week.   Goals:   Record weight taken outside of class.   Track foods and beverages eaten each day in the "Food and Activity Tracker," including calories and fat grams for each item.    Track activity type, minutes you were active, and distance you reached each day in the "Food and Activity Tracker."   Set aside one 20 to 30-minute block of time every day or find two or more periods of 10 to15 minutes each for physical activity.   Make a Physical Activities Plan for the Week.   Make active lifestyle choices all through the day   Stay at or go slightly over activity goal.   Follow-Up Plan:  Attend Core Session 8 next week.   E-mail completed "Food and Activity Tracker" to Lifestyle Coach next week before class

## 2020-11-08 ENCOUNTER — Encounter: Payer: Medicare PPO | Attending: Family Medicine | Admitting: Dietician

## 2020-11-08 DIAGNOSIS — R7303 Prediabetes: Secondary | ICD-10-CM | POA: Diagnosis not present

## 2020-11-08 NOTE — Progress Notes (Signed)
On 11/08/2020 patient completed Core Session 8 of Diabetes Prevention Program course virtually with [facility]. The following learning objectives were met by the patient during this class:   I connected with Tiburcio Bash. [name], 17-Jul-1951 by a video enabled application and verified that I am speaking with the correct person using two identifiers.  Location: Patient: Virtual  Provider: Office  Learning Objectives:  Recognize positive and negative food and activity cues.   Change negative food and activity cues to positive cues.   Add positive cues for activity and eliminate cues for inactivity.   Develop a plan for removing one problem food cue for the coming week.   Goals:   Record weight taken outside of class.   Track foods and beverages eaten each day in the "Food and Activity Tracker," including calories and fat grams for each item.    Track activity type, minutes you were active, and distance you reached each day in the "Food and Activity Tracker."   Set aside one 20 to 30-minute block of time every day or find two or more periods of 10 to15 minutes each for physical activity.   Remove one problem food cue.   Add one positive cue for being more active.  Follow-Up Plan: . Attend Core Session 9 next week.  . Email completed "Food and Activity Tracker" next week to be reviewed by Lifestyle Coach.

## 2020-11-15 ENCOUNTER — Encounter (HOSPITAL_BASED_OUTPATIENT_CLINIC_OR_DEPARTMENT_OTHER): Payer: Medicare PPO | Admitting: Dietician

## 2020-11-15 DIAGNOSIS — R7303 Prediabetes: Secondary | ICD-10-CM

## 2020-11-15 NOTE — Progress Notes (Signed)
On 11/15/2020 patient completed Core Session 9 of Diabetes Prevention Program course virtually with [facility]. The following learning objectives were met by the patient during this class:   Virtual Visit via Video Note  I connected with [name], 04/19/1951 by a video enabled application and verified that I am speaking with the correct person using two identifiers.  Location: Patient: Virtual Provider: Office  Learning Objectives:  List and describe five steps to problem solving.   Apply the five problem solving steps to resolve a problem he or she has with eating less fat and fewer calories or being more active.   Goals:   Record weight taken outside of class.   Track foods and beverages eaten each day in the "Food and Activity Tracker," including calories and fat grams for each item.    Track activity type, minutes you were active, and distance you reached each day in the "Food and Activity Tracker."   Set aside one 20 to 30-minute block of time every day or find two or more periods of 10 to15 minutes each for physical activity.   Use problem solving action plan created during session to problem solve.   Follow-Up Plan:  Attend Core Session 10 next week.   Email completed "Food and Activity Tracker" next week to be reviewed by Lifestyle Coach.  Email menus from favorite restaurants to next session for future discussion.   

## 2020-11-27 ENCOUNTER — Other Ambulatory Visit: Payer: Self-pay | Admitting: Internal Medicine

## 2020-11-29 ENCOUNTER — Encounter (HOSPITAL_BASED_OUTPATIENT_CLINIC_OR_DEPARTMENT_OTHER): Payer: Medicare PPO | Admitting: Dietician

## 2020-11-29 ENCOUNTER — Other Ambulatory Visit: Payer: Self-pay

## 2020-11-29 DIAGNOSIS — E559 Vitamin D deficiency, unspecified: Secondary | ICD-10-CM | POA: Diagnosis not present

## 2020-11-29 DIAGNOSIS — M810 Age-related osteoporosis without current pathological fracture: Secondary | ICD-10-CM | POA: Diagnosis not present

## 2020-11-29 DIAGNOSIS — R7303 Prediabetes: Secondary | ICD-10-CM

## 2020-11-29 NOTE — Telephone Encounter (Signed)
Pharmacy reports they are unable to get azelastine nasal spray at this time.  I have sent script to try otc nasal spray Nasalcrom, which is different. Try using per directions on the box and see if it helps.

## 2020-11-29 NOTE — Telephone Encounter (Signed)
Request received from pharmacy for alternative medication. Please advise  Changes Requested   Name from pharmacy: AZELASTINE 0.1% (137 MCG) SPRY       Will file in chart as: azelastine (ASTELIN) 0.1 % nasal spray   Sig: USE 1-2 PUFFS IN EACH NOSTRIL DAILY AS NEEDED   Disp:  Not specified (Pharmacy requested: 90 each)  Refills:  3   Start: 11/27/2020   Class: Normal   Non-formulary   Last ordered: 8 months ago by Deneise Lever, MD Last refill: 11/26/2020   Rx #: [id number]   Pharmacy comment: Alternative Requested:MANUFACTURE D/C WE ARE UNABLE TO GET IT.     To be filled at: [id number] - [place], [place]

## 2020-11-29 NOTE — Progress Notes (Signed)
On 11/29/2020 patient completed Core Session 10 of Diabetes Prevention Program course virtually with [facility]. The following learning objectives were met by the patient during this class:   Virtual Visit via Video Note  I connected with [name]. [name], 03-Mar-1951 by a video enabled application and verified that I am speaking with the correct person using two identifiers.  Location: Patient: Virtual Provider: Office  Learning Objectives:  List and describe the four keys for healthy eating out.   Give examples of how to apply these keys at the type of restaurants that the participants go to regularly.   Make an appropriate meal selection from a restaurant menu.   Demonstrate how to ask for a substitute item using assertive language and a polite tone of voice.    Goals:   Record weight taken outside of class.   Track foods and beverages eaten each day in the "Food and Activity Tracker," including calories and fat grams for each item.    Track activity type, minutes you were active, and distance you reached each day in the "Food and Activity Tracker."   Set aside one 20 to 30-minute block of time every day or find two or more periods of 10 to15 minutes each for physical activity.   Utilize positive action plan and complete questions on "To Do List."   Follow-Up Plan:  Attend Core Session 11.   Email completed "Food and Activity Tracker" to be reviewed by Lifestyle Coach.

## 2020-12-02 ENCOUNTER — Other Ambulatory Visit: Payer: Self-pay

## 2020-12-02 ENCOUNTER — Ambulatory Visit: Payer: Medicare PPO

## 2020-12-02 ENCOUNTER — Encounter (HOSPITAL_BASED_OUTPATIENT_CLINIC_OR_DEPARTMENT_OTHER): Payer: Self-pay | Admitting: Obstetrics & Gynecology

## 2020-12-02 ENCOUNTER — Ambulatory Visit (INDEPENDENT_AMBULATORY_CARE_PROVIDER_SITE_OTHER): Payer: Medicare PPO | Admitting: Obstetrics & Gynecology

## 2020-12-02 VITALS — BP 140/76 | HR 59 | Ht 64.0 in | Wt 186.0 lb

## 2020-12-02 DIAGNOSIS — B009 Herpesviral infection, unspecified: Secondary | ICD-10-CM | POA: Diagnosis not present

## 2020-12-02 DIAGNOSIS — R87612 Low grade squamous intraepithelial lesion on cytologic smear of cervix (LGSIL): Secondary | ICD-10-CM

## 2020-12-02 DIAGNOSIS — Z01419 Encounter for gynecological examination (general) (routine) without abnormal findings: Secondary | ICD-10-CM

## 2020-12-02 DIAGNOSIS — Z9189 Other specified personal risk factors, not elsewhere classified: Secondary | ICD-10-CM | POA: Diagnosis not present

## 2020-12-02 DIAGNOSIS — Z780 Asymptomatic menopausal state: Secondary | ICD-10-CM | POA: Diagnosis not present

## 2020-12-02 DIAGNOSIS — Z1231 Encounter for screening mammogram for malignant neoplasm of breast: Secondary | ICD-10-CM | POA: Diagnosis not present

## 2020-12-02 DIAGNOSIS — N3941 Urge incontinence: Secondary | ICD-10-CM | POA: Diagnosis not present

## 2020-12-02 DIAGNOSIS — M8580 Other specified disorders of bone density and structure, unspecified site: Secondary | ICD-10-CM

## 2020-12-02 DIAGNOSIS — M810 Age-related osteoporosis without current pathological fracture: Secondary | ICD-10-CM | POA: Insufficient documentation

## 2020-12-02 NOTE — Progress Notes (Signed)
70 y.o. G1P1 Married White or Caucasian female here for breast and pelvic exam.  Her last BMD was 2015.  She and I have discussed this.  Aware she should have it done again.  Order placed.  Will try and have this done with [facility] in September.  Pt wants my opinion about getting the next Covid booster.  Feel she should proceed.  Pt is having some issues with urinary incontinence.  She typically does not leak unless bladder is full and she sees a bathroom or is getting out of her car.  Feels to her like there are triggers for her incontinence.  Does not typically cough with laughing or sneezing.    Denies vaginal bleeding.  Patient's last menstrual period was 09/05/2003.          Sexually active: No.  H/O STD:  no  Health Maintenance: PCP:  Addison Lank.  Last wellness appt was is seen every 6 months.  Did blood work at that appt:  Does every 6 months Vaccines are up to date:  yes Colonoscopy:  05/2019 [facility]:  05/2020 BMD:  2015 Last pap smear:  09/2019, neg with neg HR HPV.   H/o abnormal pap smear:  LGISL in 2015   reports that she has never smoked. She has never used smokeless tobacco. She reports current alcohol use. She reports that she does not use drugs.  Past Medical History:  Diagnosis Date  . Acid reflux   . Anemia    "during the time I was taking meloxicam"  . Arthritis   . Asthma    LOV  6/13  Dr Annamaria Boots EPIC/ clearance on chart  from 10/12  . Depression   . Dysrhythmia    PVC's with caffeine and anxiety  . Heart murmur    since childhood  . Hiatal hernia   . High cholesterol   . Hypertension    OV with clearance and note Dr Addison Lank 6/13 chart  . Osteopenia   . Osteoporosis   . Sleep apnea    no CPAP/ last study 9 yrs ago- "mild per patient"  . STD (sexually transmitted disease)    HSV2  . Stye 06/2009   eye    Past Surgical History:  Procedure Laterality Date  . bilateral hip replacement Bilateral 6/07, 9/07   right, then left  . BREAST REDUCTION SURGERY    . CESAREAN  SECTION    . FRACTURE SURGERY     Left leg-1977  . NASAL SEPTOPLASTY W/ TURBINOPLASTY Bilateral 07/19/2018   Procedure: NASAL SEPTOPLASTY WITH TURBINATE REDUCTION;  Surgeon: Jerrell Belfast, MD;  Location: [facility];  Service: ENT;  Laterality: Bilateral;  . NASAL SEPTUM SURGERY    . TOTAL KNEE ARTHROPLASTY  03/18/2012   Procedure: TOTAL KNEE ARTHROPLASTY;  Surgeon: Gearlean Alf, MD;  Location: [facility];  Service: Orthopedics;  Laterality: Left;  . TOTAL KNEE ARTHROPLASTY Right 07/23/2017   Procedure: RIGHT TOTAL KNEE ARTHROPLASTY;  Surgeon: Gaynelle Arabian, MD;  Location: [facility];  Service: Orthopedics;  Laterality: Right;    Current Outpatient Medications  Medication Sig Dispense Refill  . acetaminophen (TYLENOL 8 HOUR) 650 MG CR tablet Take 1 tablet (650 mg total) by mouth every 8 (eight) hours as needed. 30 tablet 0  . albuterol (VENTOLIN HFA) 108 (90 Base) MCG/ACT inhaler INHALE 2 PUFFS INTO THE LUNGS EVERY 4 (FOUR) HOURS AS NEEDED FOR WHEEZING OR SHORTNESS OF BREATH. 54 g 3  . amLODipine (NORVASC) 5 MG tablet Take 5 mg by mouth  daily before breakfast.    . amoxicillin-clavulanate (AUGMENTIN) 875-125 MG tablet Take 1 tablet by mouth 2 (two) times daily. 14 tablet 1  . atorvastatin (LIPITOR) 40 MG tablet atorvastatin 40 mg tablet    . azelastine (ASTELIN) 0.1 % nasal spray 1-2 puffs each nostril daily as needed 90 mL 3  . Calcium Carb-Cholecalciferol (CALCIUM + VITAMIN D3 PO) Take 1 tablet by mouth daily.    . cetirizine (ZYRTEC) 10 MG tablet Take 10 mg by mouth daily as needed for allergies.     . Cholecalciferol (VITAMIN D3) 5000 units CAPS Take 5,000 Units by mouth daily.    . cloNIDine (CATAPRES) 0.1 MG tablet Take 0.1 mg by mouth at bedtime.    Marland Kitchen denosumab (PROLIA) 60 MG/ML SOSY injection See admin instructions.    Marland Kitchen esomeprazole (NEXIUM) 40 MG capsule Nexium 40 mg capsule,delayed release   40 mg by oral route.    . Eszopiclone 3 MG TABS Take 3 mg by mouth at bedtime. Take immediately  before bedtime    . fluticasone (FLONASE) 50 MCG/ACT nasal spray Place 2 sprays into both nostrils at bedtime. 48 mL 3  . Fluticasone-Umeclidin-Vilant (TRELEGY ELLIPTA) 100-62.5-25 MCG/INH AEPB Inhale 1 puff into the lungs daily. 60 each 2  . Influenza vac split quadrivalent PF (FLUZONE HIGH-DOSE) 0.5 ML injection Fluzone High-Dose 2019-20 (PF) 180 mcg/0.5 mL intramuscular syringe  TO BE ADMINISTERED BY PHARMACIST FOR IMMUNIZATION    . magnesium gluconate (MAGONATE) 500 MG tablet Take 500 mg at bedtime by mouth.     . methocarbamol (ROBAXIN) 500 MG tablet Take 1 tablet (500 mg total) every 6 (six) hours as needed by mouth for muscle spasms. (Patient taking differently: Take 500 mg by mouth every 8 (eight) hours as needed for muscle spasms.) 80 tablet 0  . mometasone (NASONEX) 50 MCG/ACT nasal spray mometasone 50 mcg/actuation nasal spray    . montelukast (SINGULAIR) 10 MG tablet 1 daily 90 tablet 3  . Multiple Vitamin (MULTI-VITAMINS) TABS Take 1 tablet by mouth daily.     . valACYclovir (VALTREX) 500 MG tablet TAKE 1 TABLET BY MOUTH EVERY DAY INCREASE TO TWICE A DAY FOR 3 DAYS WITH SYMPTOMS AS NEEDED 45 tablet 1  . WIXELA INHUB 100-50 MCG/DOSE AEPB TAKE 1 PUFF BY MOUTH TWICE A DAY 180 each 3  . Zoster Vaccine Adjuvanted [facility]) injection Shingrix (PF) 50 mcg/0.5 mL intramuscular suspension, kit     No current facility-administered medications for this visit.    Family History  Problem Relation Age of Onset  . Heart disease Father   . Cancer Father   . Hypertension Father   . Kidney Stones Father   . Arthritis Mother   . Hypertension Mother   . Pancreatic cancer Other   . Coronary artery disease Other   . Cancer Other   . Asthma Other   . Nephritis Daughter        glomerular    Review of Systems  Genitourinary: Positive for urgency.       Incontinence    Exam:   BP 140/76 (BP Location: Right Arm, Patient Position: Sitting, Cuff Size: Normal)   Pulse (!) 59   Ht '5\' 4"'   (1.626 m)   Wt 186 lb (84.4 kg)   LMP 09/05/2003   BMI 31.93 kg/m   Height: '5\' 4"'  (162.6 cm)  General appearance: alert, cooperative and appears stated age Breasts: normal appearance, no masses or tenderness Abdomen: soft, non-tender; bowel sounds normal; no masses,  no organomegaly  Lymph nodes: Cervical, supraclavicular, and axillary nodes normal.  No abnormal inguinal nodes palpated Neurologic: Grossly normal  Pelvic: External genitalia:  no lesions              Urethra:  normal appearing urethra with no masses, tenderness or lesions              Bartholins and Skenes: normal                 Vagina: normal appearing vagina with normal color and discharge, no lesions              Cervix: no lesions              Pap taken: No. Bimanual Exam:  Uterus:  normal size, contour, position, consistency, mobility, non-tender              Adnexa: normal adnexa and no mass, fullness, tenderness               Rectovaginal: Confirms               Anus:  normal sphincter tone, no lesions  Chaperone, Cydney Ok, CMA, was present for exam.  Assessment/Plan: 1. GYN exam for high-risk Medicare patient - pap 09/2019 neg with neg HR HPV - [facility] 05/2020, order placed - colonoscopy 05/2019 - lab work with Dr. Cari Caraway - vaccines reviewed  2. Postmenopausal - no HRT  3. 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 Urge incontinence of urine - reviewed options for treatment including PT, medications and referral to [facility] gynecology.  Pt would like to start with PT. - Ambulatory referral to Physical Therapy

## 2020-12-05 ENCOUNTER — Encounter (HOSPITAL_BASED_OUTPATIENT_CLINIC_OR_DEPARTMENT_OTHER): Payer: Self-pay | Admitting: Obstetrics & Gynecology

## 2020-12-05 DIAGNOSIS — N3941 Urge incontinence: Secondary | ICD-10-CM | POA: Insufficient documentation

## 2020-12-05 DIAGNOSIS — B009 Herpesviral infection, unspecified: Secondary | ICD-10-CM | POA: Insufficient documentation

## 2020-12-05 DIAGNOSIS — Z780 Asymptomatic menopausal state: Secondary | ICD-10-CM | POA: Insufficient documentation

## 2020-12-06 ENCOUNTER — Encounter: Payer: Medicare PPO | Attending: Family Medicine | Admitting: Dietician

## 2020-12-06 ENCOUNTER — Other Ambulatory Visit: Payer: Self-pay

## 2020-12-06 DIAGNOSIS — R7303 Prediabetes: Secondary | ICD-10-CM

## 2020-12-06 NOTE — Progress Notes (Signed)
On 12/06/2020 patient completed Session 11 of Diabetes Prevention Program course virtually with [facility]. By the end of this session patients are able to complete the following objectives:   Virtual Visit via Video Note  I connected with [name]. [name], 10-Mar-1951 by a video enabled application and verified that I am speaking with the correct person using two identifiers.  Location: Patient: Virtual  Provider: Office  Learning Objectives:  Give examples of negative thoughts that could prevent them from meeting their goals of losing weight and being more physically active.   Describe how to stop negative thoughts and talk back to them with positive thoughts.   Practice 1) stopping negative thoughts and 2) talking back to negative thoughts with positive ones.    Goals:   Record weight taken outside of class.   Track foods and beverages eaten each day in the "Food and Activity Tracker," including calories and fat grams for each item.    Track activity type, minutes you were active, and distance you reached each day in the "Food and Activity Tracker."   If you have any negative thoughts-write them in your Food and Activity Trackers, along with how you talked back to them. Practice stopping negative thoughts and talking back to them with positive thoughts.   Follow-Up Plan:  Attend Core Session 12 next week.   Email completed "Food and Activity Tracker" before next week to be reviewed by Lifestyle Coach.

## 2020-12-13 ENCOUNTER — Encounter (HOSPITAL_BASED_OUTPATIENT_CLINIC_OR_DEPARTMENT_OTHER): Payer: Medicare PPO | Admitting: Dietician

## 2020-12-13 ENCOUNTER — Other Ambulatory Visit: Payer: Self-pay

## 2020-12-13 DIAGNOSIS — R7303 Prediabetes: Secondary | ICD-10-CM

## 2020-12-13 NOTE — Progress Notes (Signed)
Patient was seen on 12/13/2020 for the Core Session 12 of Diabetes Prevention Program course at [facility]. By the end of this session patients are able to complete the following objectives:   Virtual Visit via Video Note  I connected with [name], October 09, 1950 on 12/13/20 at 10:30 AM EDT by a video enabled application and verified that I am speaking with the correct person using two identifiers.  Location: Patient: Virtual  Provider: Office  Learning Objectives:  Describe their current progress toward defined goals.  Describe common causes for slipping from healthy eating or being  active.  Explain what to do to get back on their feet after a slip.  Goals:   Record weight taken outside of class.   Track foods and beverages eaten each day in the "Food and Activity Tracker," including calories and fat grams for each item.    Track activity type, minutes active, and distance reached each day in the "Food and Activity Tracker."   Try out the two action plans created during session- "Slips from Healthy Eating: Action Plan" and "Slips from Being Active: Action Plan"  Answer questions on the handout.   Follow-Up Plan:  Attend Core Session 13 next week.   Bring completed "Food and Activity Tracker" next week to be reviewed by Lifestyle Coach.

## 2020-12-16 DIAGNOSIS — M810 Age-related osteoporosis without current pathological fracture: Secondary | ICD-10-CM | POA: Diagnosis not present

## 2020-12-16 DIAGNOSIS — Z20822 Contact with and (suspected) exposure to covid-19: Secondary | ICD-10-CM | POA: Diagnosis not present

## 2020-12-20 ENCOUNTER — Encounter (HOSPITAL_BASED_OUTPATIENT_CLINIC_OR_DEPARTMENT_OTHER): Payer: Medicare PPO | Admitting: Dietician

## 2020-12-20 ENCOUNTER — Other Ambulatory Visit: Payer: Self-pay

## 2020-12-20 DIAGNOSIS — R7303 Prediabetes: Secondary | ICD-10-CM

## 2020-12-20 NOTE — Progress Notes (Signed)
On 12/20/2020 patient completed the Core Session 13 of Diabetes Prevention Program course virtually with [facility]. By the end of this session patients are able to complete the following objectives:   Virtual Visit via Video Note  I connected with@ on 12/20/20 at 10:30 AM EDT by a video enabled application and verified that I am speaking with the correct person using two identifiers.  Location: Patient: Virtual Provider: Office  Learning Objectives:  Describe ways to add interest and variety to their activity plans.  Define ?aerobic fitness.  Explain the four F.I.T.T. principles (frequency, intensity, time, and type of activity) and how they relate to aerobic fitness.   Goals:   Record weight taken outside of class.   Track foods and beverages eaten each day in the "Food and Activity Tracker," including calories and fat grams for each item.    Track activity type, minutes you were active, and distance you reached each day in the "Food and Activity Tracker."   Do your best to reach activity goal for the week.  Use one of the F.I.T.T. principles to jump start workouts.  Document activity level on the "To Do Next Week" handout.  Follow-Up Plan:  Attend Core Session 14 next week.   Email completed "Food and Activity Tracker" before next week to be reviewed by Lifestyle Coach.

## 2020-12-22 ENCOUNTER — Other Ambulatory Visit: Payer: Self-pay | Admitting: Internal Medicine

## 2020-12-23 ENCOUNTER — Other Ambulatory Visit: Payer: Self-pay | Admitting: Internal Medicine

## 2020-12-23 DIAGNOSIS — E559 Vitamin D deficiency, unspecified: Secondary | ICD-10-CM | POA: Diagnosis not present

## 2020-12-23 DIAGNOSIS — Z79899 Other long term (current) drug therapy: Secondary | ICD-10-CM | POA: Diagnosis not present

## 2020-12-23 DIAGNOSIS — E669 Obesity, unspecified: Secondary | ICD-10-CM | POA: Diagnosis not present

## 2020-12-23 DIAGNOSIS — E782 Mixed hyperlipidemia: Secondary | ICD-10-CM | POA: Diagnosis not present

## 2020-12-23 DIAGNOSIS — R7989 Other specified abnormal findings of blood chemistry: Secondary | ICD-10-CM | POA: Diagnosis not present

## 2020-12-23 DIAGNOSIS — I10 Essential (primary) hypertension: Secondary | ICD-10-CM | POA: Diagnosis not present

## 2020-12-23 DIAGNOSIS — R7309 Other abnormal glucose: Secondary | ICD-10-CM | POA: Diagnosis not present

## 2020-12-23 DIAGNOSIS — K219 Gastro-esophageal reflux disease without esophagitis: Secondary | ICD-10-CM | POA: Diagnosis not present

## 2020-12-23 DIAGNOSIS — Z0000 Encounter for general adult medical examination without abnormal findings: Secondary | ICD-10-CM | POA: Diagnosis not present

## 2020-12-23 MED ORDER — AMOXICILLIN-POT CLAVULANATE 875-125 MG PO TABS: 1.0000 | ORAL_TABLET | Freq: Two times a day (BID) | ORAL | 1 refills | Status: DC

## 2020-12-23 NOTE — Telephone Encounter (Signed)
Pharmacy can not get Azelastine nasal spray, requesting a change in medication.  CY please advise

## 2020-12-23 NOTE — Telephone Encounter (Signed)
Called and spoke with patient, she states she is ok with using Nasalcrom. She also states that she usually has a prescription on hold for Augmentin for Sinus infections. States it usually goes to her chest and you advised to keep on hand and take as soon as symptoms start. Is that ok to send in?  CY please advise  Thank you

## 2020-12-23 NOTE — Telephone Encounter (Signed)
Removed Astelin. Sent in Nasalcrom and Augmentin for patient. Patient is aware.  Nothing further needed at this time.

## 2020-12-27 ENCOUNTER — Encounter: Payer: Self-pay | Admitting: Registered"

## 2020-12-27 ENCOUNTER — Encounter (HOSPITAL_BASED_OUTPATIENT_CLINIC_OR_DEPARTMENT_OTHER): Payer: Medicare PPO | Admitting: Registered"

## 2020-12-27 DIAGNOSIS — R7303 Prediabetes: Secondary | ICD-10-CM | POA: Diagnosis not present

## 2020-12-27 DIAGNOSIS — Z713 Dietary counseling and surveillance: Secondary | ICD-10-CM | POA: Diagnosis not present

## 2020-12-27 NOTE — Progress Notes (Signed)
On 12/27/20 patient completed Core Session 14 of Diabetes Prevention Program course virtually with [facility]. By the end of this session patients are able to complete the following objectives:   Virtual Visit via Video Note  I connected with [name] on 12/27/20 at 10:30 AM EDT by a video enabled application and verified that I am speaking with the correct person using two identifiers.  Location: Patient: Home.  Provider: Office.   Learning Objectives:  Give examples of problem social cues and helpful social cues.   Explain how to remove problem social cues and add helpful ones.   Describe ways of coping with vacations and social events such as parties, holidays, and visits from relatives and friends.   Create an action plan to change a problem social cue and add a helpful one.   Goals:   Record weight taken outside of class.   Track foods and beverages eaten each day in the "Food and Activity Tracker," including calories and fat grams for each item.    Track activity type, minutes you were active, and distance you reached each day in the "Food and Activity Tracker."   Do your best to reach activity goal for the week.  Use action plan created during session to change a problem social cue and add a helpful social cue.   Answer questions regarding success of changing social cues on "To Do Next Week" handout.   Follow-Up Plan:  Attend Core Session 15 next week.   Email completed "Food and Activity Tracker" before next week to be reviewed by Lifestyle Coach.

## 2020-12-29 DIAGNOSIS — H5213 Myopia, bilateral: Secondary | ICD-10-CM | POA: Diagnosis not present

## 2021-01-03 ENCOUNTER — Encounter: Payer: Self-pay | Admitting: Registered"

## 2021-01-03 ENCOUNTER — Encounter: Payer: Medicare PPO | Attending: Family Medicine | Admitting: Registered"

## 2021-01-03 DIAGNOSIS — Z713 Dietary counseling and surveillance: Secondary | ICD-10-CM | POA: Diagnosis not present

## 2021-01-03 DIAGNOSIS — R7303 Prediabetes: Secondary | ICD-10-CM

## 2021-01-03 NOTE — Progress Notes (Signed)
On 01/03/21 patient completed Core Session 15 of Diabetes Prevention Program course virtually with [facility]. By the end of this session patients are able to complete the following objectives:   Virtual Visit via Video Note  I connected with [name] on 01/03/21 at 10:30 AM EDT by a video enabled application and verified that I am speaking with the correct person using two identifiers.  Location: Patient: Home.  Provider: Office.   Learning Objectives:  Explain how to prevent stress or cope with unavoidable stress.   Describe how this program can be a source of stress.   Explain how to manage stressful situations.   Create and follow an action plan for either preventing or coping with a stressful situation.   Goals:   Record weight taken outside of class.   Track foods and beverages eaten each day in the "Food and Activity Tracker," including calories and fat grams for each item.    Track activity type, minutes you were active, and distance you reached each day in the "Food and Activity Tracker."   Do your best to reach activity goal for the week.  Follow your action plan to reduce stress.   Answer questions on handout regarding success of action plan.   Follow-Up Plan:  Attend Core Session 16 next week.   Email completed "Food and Activity Tracker" before next week to be reviewed by Lifestyle Coach.

## 2021-01-04 DIAGNOSIS — R319 Hematuria, unspecified: Secondary | ICD-10-CM | POA: Diagnosis not present

## 2021-01-04 DIAGNOSIS — N390 Urinary tract infection, site not specified: Secondary | ICD-10-CM | POA: Diagnosis not present

## 2021-01-10 ENCOUNTER — Other Ambulatory Visit: Payer: Self-pay

## 2021-01-10 ENCOUNTER — Encounter (HOSPITAL_BASED_OUTPATIENT_CLINIC_OR_DEPARTMENT_OTHER): Payer: Medicare PPO | Admitting: Dietician

## 2021-01-10 DIAGNOSIS — R7303 Prediabetes: Secondary | ICD-10-CM

## 2021-01-10 NOTE — Progress Notes (Signed)
On 01/10/2021 patient completed Core Session 16 of Diabetes Prevention Program course virtually with [facility]. By the end of this session patients are able to complete the following objectives:   Virtual Visit via Video Note  I connected with [name], [birth year]  on 01/10/21 at 10:30 AM EDT by a video enabled application and verified that I am speaking with the correct person using two identifiers.  Location: Patient: Virtual Provider: Office  Learning Objectives:  Measure their progress toward weight and physical activity goals since Session 1.   Develop a plan for improving progress, if their goals have not yet been attained.   Describe ways to stay motivated long-term.   Goals:   Record weight taken outside of class.   Track foods and beverages eaten each day in the "Food and Activity Tracker," including calories and fat grams for each item.    Track activity type, minutes you were active, and distance you reached each day in the "Food and Activity Tracker."   Utilize action plan to help stay motivated and complete questions on "To Do List."   Follow-Up Plan:  Attend session 17 in two weeks.   Email completed "Food and Activity Tracker" before next session to be reviewed by Lifestyle Coach.

## 2021-01-17 ENCOUNTER — Other Ambulatory Visit: Payer: Self-pay | Admitting: Internal Medicine

## 2021-01-17 DIAGNOSIS — J45909 Unspecified asthma, uncomplicated: Secondary | ICD-10-CM | POA: Diagnosis not present

## 2021-01-17 DIAGNOSIS — M810 Age-related osteoporosis without current pathological fracture: Secondary | ICD-10-CM | POA: Diagnosis not present

## 2021-01-17 DIAGNOSIS — I10 Essential (primary) hypertension: Secondary | ICD-10-CM | POA: Diagnosis not present

## 2021-01-17 DIAGNOSIS — E782 Mixed hyperlipidemia: Secondary | ICD-10-CM | POA: Diagnosis not present

## 2021-01-17 DIAGNOSIS — N3001 Acute cystitis with hematuria: Secondary | ICD-10-CM | POA: Diagnosis not present

## 2021-01-17 DIAGNOSIS — J309 Allergic rhinitis, unspecified: Secondary | ICD-10-CM | POA: Diagnosis not present

## 2021-01-17 DIAGNOSIS — R7301 Impaired fasting glucose: Secondary | ICD-10-CM | POA: Diagnosis not present

## 2021-01-17 DIAGNOSIS — K219 Gastro-esophageal reflux disease without esophagitis: Secondary | ICD-10-CM | POA: Diagnosis not present

## 2021-01-24 ENCOUNTER — Other Ambulatory Visit: Payer: Self-pay

## 2021-01-24 ENCOUNTER — Encounter (HOSPITAL_BASED_OUTPATIENT_CLINIC_OR_DEPARTMENT_OTHER): Payer: Medicare PPO | Admitting: Dietician

## 2021-01-24 DIAGNOSIS — R7303 Prediabetes: Secondary | ICD-10-CM | POA: Diagnosis not present

## 2021-01-24 NOTE — Progress Notes (Signed)
On 01/24/2021 patient completed a post core session of the Diabetes Prevention Program course virtually with [facility]. By the end of this session patients are able to complete the following objectives:   Virtual Visit via Video Note  I connected with [name]. [name], April 01, 1951 by a video enabled application and verified that I am speaking with the correct person using two identifiers.  Location: Patient: Virtual Provider: Office  Learning Objectives:  Identify how to maintain and/or continue working toward program goals for the remainder of the program.   Describe ways that food and activity tracking can assist them in maintaining/reaching program goals.   Identify progress they have made since the beginning of the program.   Describe the differences between unsaturated, saturated, and trans fat on heart health.   List dietary sources of unsaturated, saturated, and trans fats.  Explain ways to reduce intake of saturated fat and replace them with heart healthy fats.  Goals:   Record weight taken outside of class.   Track foods and beverages eaten each day in the "Food and Activity Tracker," including calories and fat grams for each item.    Track activity type, minutes you were active, and distance you reached each day in the "Food and Activity Tracker."   Follow-Up Plan: . Attend next session.  . Email completed "Food and Activity Trackers" before next session to be reviewed by Lifestyle Coach.

## 2021-01-27 ENCOUNTER — Ambulatory Visit: Payer: Medicare PPO | Attending: Nurse Practitioner | Admitting: Physical Therapy

## 2021-01-27 ENCOUNTER — Other Ambulatory Visit: Payer: Self-pay

## 2021-01-27 ENCOUNTER — Encounter: Payer: Self-pay | Admitting: Physical Therapy

## 2021-01-27 DIAGNOSIS — R279 Unspecified lack of coordination: Secondary | ICD-10-CM

## 2021-01-27 DIAGNOSIS — M25652 Stiffness of left hip, not elsewhere classified: Secondary | ICD-10-CM | POA: Diagnosis not present

## 2021-01-27 DIAGNOSIS — M6281 Muscle weakness (generalized): Secondary | ICD-10-CM | POA: Diagnosis not present

## 2021-01-27 DIAGNOSIS — M25651 Stiffness of right hip, not elsewhere classified: Secondary | ICD-10-CM | POA: Diagnosis not present

## 2021-01-27 NOTE — Therapy (Signed)
[facility] [address], [place], [place], [address] Phone: [phone]   Fax:  [phone]  Physical Therapy Evaluation  Patient Details  Name: [name] MRN: [id number] Date of Birth: 1951/02/09 Referring Provider (PT): Megan Salon, MD   Encounter Date: 01/27/2021   PT End of Session - 01/27/21 1105    Visit Number 1    Date for PT Re-Evaluation 04/21/21    Authorization Type [employer] - submitted today    Authorization - Visit Number 1    Authorization - Number of Visits 8    PT Start Time 7616    PT Stop Time 1145    PT Time Calculation (min) 40 min    Activity Tolerance Patient tolerated treatment well    Behavior During Therapy [facility] for tasks assessed/performed           Past Medical History:  Diagnosis Date  . Acid reflux   . Anemia    "during the time I was taking meloxicam"  . Arthritis   . Asthma    LOV  6/13  Dr Annamaria Boots EPIC/ clearance on chart  from 10/12  . Depression   . Dysrhythmia    PVC's with caffeine and anxiety  . Heart murmur    since childhood  . Hiatal hernia   . High cholesterol   . Hypertension    OV with clearance and note Dr Addison Lank 6/13 chart  . Osteoporosis   . Sleep apnea    no CPAP/ last study 9 yrs ago- "mild per patient"  . STD (sexually transmitted disease)    HSV2  . Stye 06/2009   eye    Past Surgical History:  Procedure Laterality Date  . bilateral hip replacement Bilateral 6/07, 9/07   right, then left  . BREAST REDUCTION SURGERY    . CESAREAN SECTION    . FRACTURE SURGERY     Left leg-1977  . NASAL SEPTOPLASTY W/ TURBINOPLASTY Bilateral 07/19/2018   Procedure: NASAL SEPTOPLASTY WITH TURBINATE REDUCTION;  Surgeon: Jerrell Belfast, MD;  Location: [place];  Service: ENT;  Laterality: Bilateral;  . NASAL SEPTUM SURGERY    . TOTAL KNEE ARTHROPLASTY  03/18/2012   Procedure: TOTAL KNEE ARTHROPLASTY;  Surgeon: Gearlean Alf, MD;  Location: [facility];   Service: Orthopedics;  Laterality: Left;  . TOTAL KNEE ARTHROPLASTY Right 07/23/2017   Procedure: RIGHT TOTAL KNEE ARTHROPLASTY;  Surgeon: Gaynelle Arabian, MD;  Location: [facility];  Service: Orthopedics;  Laterality: Right;    There were no vitals filed for this visit.    Subjective Assessment - 01/27/21 1106    Subjective Pt states leakage is mostly when getting out of the car and with strong urge.  Pt states she is using 1 pad/day and that was in the last few months.  Pt states in the last few months she had a UTI treated and things got better after that.    How long can you sit comfortably? gets back pain when sitting too long    Patient Stated Goals Get stronger and stop leakage    Currently in Pain? No/denies              [facility] PT Assessment - 01/27/21 0001      Assessment   Medical Diagnosis N39.41 (ICD-10-CM) - Urge incontinence of urine    Referring Provider (PT) Megan Salon, MD    Onset Date/Surgical Date --   a few month   Prior Therapy No  Precautions   Precautions None      Balance Screen   Has the patient fallen in the past 6 months No      [place] residence    Living Arrangements Spouse/significant other      Prior Function   Level of Independence Independent    Leisure gym      Cognition   Overall Cognitive Status Within Functional Limits for tasks assessed      Functional Tests   Functional tests Single leg stance      Single Leg Stance   Comments 4 sec unsteady bil      Posture/Postural Control   Posture/Postural Control Postural limitations    Postural Limitations Rounded Shoulders;Forward head;Increased thoracic kyphosis      ROM / Strength   AROM / PROM / Strength AROM;PROM;Strength      AROM   Overall AROM Comments thoracic stiff and no extension      PROM   Overall PROM Comments Lt hip 20% with ER and IR 50% flexion; Rt hip 50% IR/ER, 70% flexion      Strength   Overall Strength Comments  hip adduction 4/5      Flexibility   Soft Tissue Assessment /Muscle Length yes    Hamstrings 50% bil      Palpation   Palpation comment lumbar and h/s tight      Ambulation/Gait   Gait Pattern Decreased stride length                      Objective measurements completed on examination: See above findings.     Pelvic Floor Special Questions - 01/27/21 0001    Prior Pelvic/Prostate Exam Yes    Prior Pregnancies Yes    Number of Pregnancies 1    Number of C-Sections 1    Currently Sexually Active No    Urinary Leakage Yes    How often 1/day    Pad use 1/day    Activities that cause leaking With strong urge    Urinary urgency Yes    Urinary frequency goes more now because trying to be safe    Fecal incontinence --   strainging sometimes   Falling out feeling (prolapse) No    Skin Integrity Intact    Prolapse Anterior Wall;Posterior Wall    Pelvic Floor Internal Exam pt identity confirmed and internal soft tissue assessed with consent    Exam Type Vaginal    Palpation unable to coordinate with breathing    Strength fair squeeze, definite lift    Strength # of reps 2   9 quick   Strength # of seconds 2    Tone low                    PT Education - 01/27/21 1149    Education Details urge technique    Person(s) Educated Patient    Methods Explanation;Handout    Comprehension Verbalized understanding            PT Short Term Goals - 01/27/21 1200      PT SHORT TERM GOAL #1   Title pt will know how to safely stretch hips for improved ability to perform functional strengthening    Baseline she is not stretching out of fear to dislocate hiop    Time 4    Period Weeks    Status New    Target Date 02/24/21  PT SHORT TERM GOAL #2   Title Pt will improve single leg standing to at least 8 seconds without UE support    Time 4    Period Weeks    Status New    Target Date 02/24/21      PT SHORT TERM GOAL #3   Title ind with initial HEP     Time 4    Period Weeks    Status New    Target Date 02/24/21             PT Long Term Goals - 01/27/21 1154      PT LONG TERM GOAL #1   Title Pt will be ind with advanced HEP    Time 12    Period Weeks    Status New    Target Date 04/21/21      PT LONG TERM GOAL #2   Title Pt will report leakage reduced to 1-2x/month due to improved endurance of pelvic floor to 10 seconds    Baseline leaking 1/day and 2 second hold    Time 12    Period Weeks    Status New    Target Date 04/21/21      PT LONG TERM GOAL #3   Title Pt will demonstrate improved LE strength and stability with SLS for at least 12 seconds without UE support    Time 12    Period Weeks    Status New    Target Date 04/21/21      PT LONG TERM GOAL #4   Title Pt will report she does not have to stop to use the restroom just in case due to improved confidence of bladder control    Time 12    Period Weeks    Status New    Target Date 04/21/21                  Plan - 01/27/21 1205    Clinical Impression Statement Pt is very friendly and active 70 y/o female who presents to skilled PT due to urge incontinence with occasional stress incontinence.  Pt has very stiff and tight hips bilaterally with bil THA. She has weakness and decreased stability with 4 seconds SLS and very shaky in SLS due to some hip weakness.  pt has decreased PROM bil hip and tight lumbar paraspinals and hamstrings bilat Lt>Rt.  Pt has some prolapse of both ant and post walls when bulging in supine during pelvic floor assessment.  Pt has 3/5MMT and can hold 2 seconds.  she is able to do 9 quick flickes but not relaxing all the way in between.  Pt has significant posture impairments as stated. Pt will benefit from skilled PT to address impairments and restore maximum function.    Personal Factors and Comorbidities Comorbidity 3+    Comorbidities osteoporosis, bil THA 2007; bil knee 2013/2017; hx of c-section    Examination-Activity Limitations  Transfers;Continence;Toileting    Examination-Participation Restrictions Community Activity    Stability/Clinical Decision Making Stable/Uncomplicated    Clinical Decision Making Moderate    Rehab Potential Excellent    PT Frequency 1x / week    PT Duration 12 weeks    PT Treatment/Interventions ADLs/Self Care Home Management;Biofeedback;Cryotherapy;Electrical Stimulation;Moist Heat;Therapeutic exercise;Therapeutic activities;Neuromuscular re-education;Patient/family education;Manual techniques;Taping;Dry needling;Passive range of motion    PT Next Visit Plan f/u on urge; hip stretch; kegel in supine and hold 2 seconds; mini squat; glute strength    PT Home Exercise Plan urge  Consulted and Agree with Plan of Care Patient           Patient will benefit from skilled therapeutic intervention in order to improve the following deficits and impairments:  Abnormal gait,Decreased balance,Increased muscle spasms,Postural dysfunction,Impaired flexibility,Decreased strength,Decreased coordination,Decreased endurance,Decreased range of motion  Visit Diagnosis: Muscle weakness (generalized) - Plan: PT plan of care cert/re-cert  Unspecified lack of coordination - Plan: PT plan of care cert/re-cert  Stiffness of left hip, not elsewhere classified - Plan: PT plan of care cert/re-cert  Stiffness of right hip, not elsewhere classified - Plan: PT plan of care cert/re-cert     Problem List Patient Active Problem List   Diagnosis Date Noted  . Postmenopausal 12/05/2020  . Urge incontinence of urine 12/05/2020  . 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  . Age-related osteoporosis without current pathological fracture 12/02/2020  . Deviated septum 10/04/2016  . Nasal turbinate hypertrophy 10/04/2016  . Itchy eyes 09/13/2016  . 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 smear of cervix 01/24/2016  . Postop Hyponatremia 03/20/2012  . OA (osteoarthritis) of knee  03/18/2012  . Sinusitis, chronic 11/25/2008  . Obstructive sleep apnea 06/14/2007  . HYPERTENSION 06/14/2007  . Seasonal and perennial allergic rhinitis 06/14/2007  . Allergic asthma, mild persistent, uncomplicated 64/38/3779  . GERD 06/14/2007    Jule Ser, PT 01/27/2021, 12:42 PM  [facility] [address] [place], [place], [address] Phone: [phone]   Fax:  [phone]  Name: [name] MRN: [id number] Date of Birth: 10-Jan-1951

## 2021-02-03 ENCOUNTER — Encounter (HOSPITAL_BASED_OUTPATIENT_CLINIC_OR_DEPARTMENT_OTHER): Payer: Self-pay | Admitting: Obstetrics & Gynecology

## 2021-02-07 ENCOUNTER — Encounter: Payer: Medicare PPO | Attending: Family Medicine | Admitting: Dietician

## 2021-02-07 ENCOUNTER — Other Ambulatory Visit: Payer: Self-pay

## 2021-02-07 DIAGNOSIS — R7303 Prediabetes: Secondary | ICD-10-CM

## 2021-02-07 NOTE — Progress Notes (Signed)
On 02/07/2021 patient completed a post core session of the Diabetes Prevention Program course virtually with [employer]. By the end of this session patients are able to complete the following objectives:   Virtual Visit via Video Note  I connected with [name]. [name], [birth year] on 02/07/21 at 10:30 AM EDT by a video enabled application and verified that I am speaking with the correct person using two identifiers.  Location: Patient: Virtual  Provider: Office  Learning Objectives:  List risk factors for heart disease.   Define the difference between HDL and LDL cholesterol  List ways to reduce risk for heart disease.   Goals:   Record weight taken outside of class.   Track foods and beverages eaten each day in the "Food and Activity Tracker," including calories and fat grams for each item.    Track activity type, minutes you were active, and distance you reached each day in the "Food and Activity Tracker."   Follow-Up Plan:  Attend next session.   Email completed "Food and Activity Trackers" before next session to be reviewed by Lifestyle Coach.

## 2021-02-21 ENCOUNTER — Encounter (HOSPITAL_BASED_OUTPATIENT_CLINIC_OR_DEPARTMENT_OTHER): Payer: Medicare PPO | Admitting: Dietician

## 2021-02-21 ENCOUNTER — Other Ambulatory Visit: Payer: Self-pay

## 2021-02-21 DIAGNOSIS — R7303 Prediabetes: Secondary | ICD-10-CM | POA: Diagnosis not present

## 2021-02-21 NOTE — Progress Notes (Signed)
On 02/21/2021 patient completed a post core session of the Diabetes Prevention Program course virtually with [employer]. By the end of this session patients are able to complete the following objectives:   I connected with Tiburcio Bash. [name], 1951-05-08 by a video enabled application and verified that I am speaking with the correct person using two identifiers.  Location: Patient: Virtual Provider: Office  Learning Objectives: Describe how to incorporate more fruits and vegetables into meals. List criteria for selecting good quality fruits and vegetables at the store.  Define mindful eating. List the benefits of eating mindfully.   Goals:  Record weight taken outside of class.  Track foods and beverages eaten each day in the "Food and Activity Tracker," including calories and fat grams for each item.   Track activity type, minutes you were active, and distance you reached each day in the "Food and Activity Tracker."   Follow-Up Plan: Attend next session.  Email completed "Food and Activity Trackers" before next session to be reviewed by Lifestyle Coach.

## 2021-03-04 ENCOUNTER — Ambulatory Visit: Payer: Medicare PPO | Attending: Nurse Practitioner | Admitting: Physical Therapy

## 2021-03-04 ENCOUNTER — Other Ambulatory Visit: Payer: Self-pay

## 2021-03-04 ENCOUNTER — Encounter: Payer: Self-pay | Admitting: Physical Therapy

## 2021-03-04 DIAGNOSIS — M6281 Muscle weakness (generalized): Secondary | ICD-10-CM | POA: Insufficient documentation

## 2021-03-04 DIAGNOSIS — M25651 Stiffness of right hip, not elsewhere classified: Secondary | ICD-10-CM | POA: Insufficient documentation

## 2021-03-04 DIAGNOSIS — M25652 Stiffness of left hip, not elsewhere classified: Secondary | ICD-10-CM | POA: Diagnosis not present

## 2021-03-04 DIAGNOSIS — R279 Unspecified lack of coordination: Secondary | ICD-10-CM

## 2021-03-04 NOTE — Patient Instructions (Signed)
Access Code: [email or web address] URL: [email or web address] Date: 03/04/2021 Prepared by: Jari Favre  Exercises Supine Hip Internal and External Rotation - 1 x daily - 7 x weekly - 1 sets - 10 reps - 10 sec hold Supine Single Knee to Chest - 1 x daily - 7 x weekly - 1 sets - 10 reps - 10 sec hold Supine Figure 4 Piriformis Stretch - 1 x daily - 7 x weekly - 1 sets - 3 reps - 30 sec hold Supine Pelvic Floor Contraction - 3 x daily - 7 x weekly - 1 sets - 10 reps - 3 sec hold Standing Hamstring Stretch with Step - 1 x daily - 7 x weekly - 1 sets - 3 reps - 30 sec hold Mini Squat with Pelvic Floor Contraction - 1 x daily - 7 x weekly - 2 sets - 10 reps

## 2021-03-04 NOTE — Therapy (Signed)
[facility] [address], [place], [place], [address] Phone: [phone]   Fax:  [phone]  Physical Therapy Treatment  Patient Details  Name: [name] MRN: [id number] Date of Birth: 04-09-1951 Referring Provider (PT): Megan Salon, MD   Encounter Date: 03/04/2021   PT End of Session - 03/04/21 1113     Visit Number 2    Date for PT Re-Evaluation 04/21/21    Authorization Type [employer] - submitted today    Authorization - Visit Number 2    Authorization - Number of Visits 8    PT Start Time 1108   arrived   PT Stop Time 4097    PT Time Calculation (min) 40 min    Activity Tolerance Patient tolerated treatment well    Behavior During Therapy [facility] for tasks assessed/performed             Past Medical History:  Diagnosis Date   Acid reflux    Anemia    "during the time I was taking meloxicam"   Arthritis    Asthma    LOV  6/13  Dr Annamaria Boots EPIC/ clearance on chart  from 10/12   Depression    Dysrhythmia    PVC's with caffeine and anxiety   Heart murmur    since childhood   Hiatal hernia    High cholesterol    Hypertension    OV with clearance and note Dr Addison Lank 6/13 chart   Osteoporosis    Sleep apnea    no CPAP/ last study 9 yrs ago- "mild per patient"   STD (sexually transmitted disease)    HSV2   Stye 06/2009   eye    Past Surgical History:  Procedure Laterality Date   bilateral hip replacement Bilateral 6/07, 9/07   right, then left   [place]     Left leg-1977   NASAL SEPTOPLASTY W/ TURBINOPLASTY Bilateral 07/19/2018   Procedure: NASAL SEPTOPLASTY WITH TURBINATE REDUCTION;  Surgeon: Jerrell Belfast, MD;  Location: [place];  Service: ENT;  Laterality: Bilateral;   NASAL SEPTUM SURGERY     TOTAL KNEE ARTHROPLASTY  03/18/2012   Procedure: TOTAL KNEE ARTHROPLASTY;  Surgeon: Gearlean Alf, MD;  Location: [facility];  Service:  Orthopedics;  Laterality: Left;   TOTAL KNEE ARTHROPLASTY Right 07/23/2017   Procedure: RIGHT TOTAL KNEE ARTHROPLASTY;  Surgeon: Gaynelle Arabian, MD;  Location[facility];  Service: Orthopedics;  Laterality: Right;    There were no vitals filed for this visit.   Subjective Assessment - 03/04/21 1113     Subjective The urge helped sometimes but not others    How long can you sit comfortably? gets back pain when sitting too long    Currently in Pain? No/denies    Multiple Pain Sites No                               [facility] Adult PT Treatment/Exercise - 03/04/21 0001       Neuro Re-ed    Neuro Re-ed Details  kegel with TC      Exercises   Exercises Lumbar      Lumbar Exercises: Stretches   Active Hamstring Stretch Right;Left;30 seconds    Single Knee to Chest Stretch Right;Left;3 reps;10 seconds    Lower Trunk Rotation Limitations hip rotation stretch    Figure  4 Stretch 2 reps;30 seconds      Lumbar Exercises: Standing   Other Standing Lumbar Exercises slow lower to elevated chair with kegel 10x      Lumbar Exercises: Supine   AB Set Limitations kegel in hooklying with TC to fully relax    Bent Knee Raise 10 reps                    PT Education - 03/04/21 1155     Education Details Access Code: [email or web address]    Person(s) Educated Patient    Methods Explanation;Demonstration;Tactile cues;Verbal cues    Comprehension Verbalized understanding;Returned demonstration              PT Short Term Goals - 01/27/21 1200       PT SHORT TERM GOAL #1   Title pt will know how to safely stretch hips for improved ability to perform functional strengthening    Baseline she is not stretching out of fear to dislocate hiop    Time 4    Period Weeks    Status New    Target Date 02/24/21      PT SHORT TERM GOAL #2   Title Pt will improve single leg standing to at least 8 seconds without UE support    Time 4    Period Weeks    Status New    Target Date  02/24/21      PT SHORT TERM GOAL #3   Title ind with initial HEP    Time 4    Period Weeks    Status New    Target Date 02/24/21               PT Long Term Goals - 01/27/21 1154       PT LONG TERM GOAL #1   Title Pt will be ind with advanced HEP    Time 12    Period Weeks    Status New    Target Date 04/21/21      PT LONG TERM GOAL #2   Title Pt will report leakage reduced to 1-2x/month due to improved endurance of pelvic floor to 10 seconds    Baseline leaking 1/day and 2 second hold    Time 12    Period Weeks    Status New    Target Date 04/21/21      PT LONG TERM GOAL #3   Title Pt will demonstrate improved LE strength and stability with SLS for at least 12 seconds without UE support    Time 12    Period Weeks    Status New    Target Date 04/21/21      PT LONG TERM GOAL #4   Title Pt will report she does not have to stop to use the restroom just in case due to improved confidence of bladder control    Time 12    Period Weeks    Status New    Target Date 04/21/21                   Plan - 03/04/21 1122     Clinical Impression Statement Pt was given initial HEP today.  Pt has tension in bilateral hips and low back that improved with stretches.  pt able to do basic pelvic floor exercises holding for 1 sec.  She fatigues after about 10 reps and used intermittent stretches to rest in between.  Pt will benefit from skilled PT to continue to  address pelvic floor coordination and strength.    Comorbidities osteoporosis, bil THA 2007; bil knee 2013/2017; hx of c-section    PT Treatment/Interventions ADLs/Self Care Home Management;Biofeedback;Cryotherapy;Electrical Stimulation;Moist Heat;Therapeutic exercise;Therapeutic activities;Neuromuscular re-education;Patient/family education;Manual techniques;Taping;Dry needling;Passive range of motion    PT Next Visit Plan f/u on HEP, progress core and kegels    PT Home Exercise Plan urge, Access Code: [email or web address]     Consulted and Agree with Plan of Care Patient             Patient will benefit from skilled therapeutic intervention in order to improve the following deficits and impairments:  Abnormal gait, Decreased balance, Increased muscle spasms, Postural dysfunction, Impaired flexibility, Decreased strength, Decreased coordination, Decreased endurance, Decreased range of motion  Visit Diagnosis: Muscle weakness (generalized)  Unspecified lack of coordination  Stiffness of right hip, not elsewhere classified  Stiffness of left hip, not elsewhere classified     Problem List Patient Active Problem List   Diagnosis Date Noted   Postmenopausal 12/05/2020   Urge incontinence of urine 12/05/2020   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   Age-related osteoporosis without current pathological fracture 12/02/2020   Deviated septum 10/04/2016   Nasal turbinate hypertrophy 10/04/2016   Itchy eyes 09/13/2016   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 smear of cervix 01/24/2016   Postop Hyponatremia 03/20/2012   OA (osteoarthritis) of knee 03/18/2012   Sinusitis, chronic 11/25/2008   Obstructive sleep apnea 06/14/2007   HYPERTENSION 06/14/2007   Seasonal and perennial allergic rhinitis 06/14/2007   Allergic asthma, mild persistent, uncomplicated 18/86/7737   GERD 06/14/2007    Camillo Flaming Roma Bondar, PT 03/04/2021, 12:00 PM  [facility] [address] [place], [place], [address] Phone: [phone]   Fax:  [phone]  Name: SHAWNISE PETERKIN MRN: [id number] Date of Birth: 1950/10/10

## 2021-03-14 ENCOUNTER — Encounter: Payer: Medicare PPO | Attending: Family Medicine | Admitting: Dietician

## 2021-03-14 ENCOUNTER — Encounter: Payer: Self-pay | Admitting: Dietician

## 2021-03-14 ENCOUNTER — Other Ambulatory Visit: Payer: Self-pay

## 2021-03-14 DIAGNOSIS — R7303 Prediabetes: Secondary | ICD-10-CM | POA: Insufficient documentation

## 2021-03-14 NOTE — Progress Notes (Signed)
On 03/14/2021 patient completed the Diabetes Prevention Program course virtually with [employer]. By the end of this session patients are able to complete the following objectives:   Virtual Visit via Video Note  I connected with [name]. [name], [birth year] by a video enabled application and verified that I am speaking with the correct person using two identifiers.  Location: Patient: Virtual Provider: Office  Learning Objectives: Define fiber and describe the difference between insoluble and soluble fiber  List foods that are good sources of fiber Explain the health benefits of fiber  Describe ways to increase volume of meals and snacks while staying within fat goal.   Goals:  Record weight taken outside of class.  Track foods and beverages eaten each day in the "Food and Activity Tracker," including calories and fat grams for each item.   Track activity type, minutes you were active, and distance you reached each day in the "Food and Activity Tracker."   Follow-Up Plan: Attend next session.  Email completed "Food and Activity Trackers" before next session to be reviewed by Lifestyle Coach.

## 2021-03-15 ENCOUNTER — Other Ambulatory Visit: Payer: Self-pay

## 2021-03-15 ENCOUNTER — Ambulatory Visit: Payer: Medicare PPO | Admitting: Physical Therapy

## 2021-03-15 DIAGNOSIS — M25651 Stiffness of right hip, not elsewhere classified: Secondary | ICD-10-CM

## 2021-03-15 DIAGNOSIS — M25652 Stiffness of left hip, not elsewhere classified: Secondary | ICD-10-CM

## 2021-03-15 DIAGNOSIS — R279 Unspecified lack of coordination: Secondary | ICD-10-CM | POA: Diagnosis not present

## 2021-03-15 DIAGNOSIS — L918 Other hypertrophic disorders of the skin: Secondary | ICD-10-CM | POA: Diagnosis not present

## 2021-03-15 DIAGNOSIS — M6281 Muscle weakness (generalized): Secondary | ICD-10-CM | POA: Diagnosis not present

## 2021-03-15 NOTE — Therapy (Signed)
[facility] [address], [place], [place], [address] Phone: [phone]   Fax:  [phone]  Physical Therapy Treatment  Patient Details  Name: [name] MRN: [id number] Date of Birth: 11/12/1950 Referring Provider (PT): Megan Salon, MD   Encounter Date: 03/15/2021   PT End of Session - 03/15/21 1238     Visit Number 3    Date for PT Re-Evaluation 04/21/21    Authorization Type [employer] - 8 visits until 8/18    Authorization - Visit Number 3    Authorization - Number of Visits 8    PT Start Time 8453    PT Stop Time 1228    PT Time Calculation (min) 40 min    Activity Tolerance Patient tolerated treatment well    Behavior During Therapy [facility] for tasks assessed/performed             Past Medical History:  Diagnosis Date   Acid reflux    Anemia    "during the time I was taking meloxicam"   Arthritis    Asthma    LOV  6/13  Dr Annamaria Boots EPIC/ clearance on chart  from 10/12   Depression    Dysrhythmia    PVC's with caffeine and anxiety   Heart murmur    since childhood   Hiatal hernia    High cholesterol    Hypertension    OV with clearance and note Dr Addison Lank 6/13 chart   Osteoporosis    Sleep apnea    no CPAP/ last study 9 yrs ago- "mild per patient"   STD (sexually transmitted disease)    HSV2   Stye 06/2009   eye    Past Surgical History:  Procedure Laterality Date   bilateral hip replacement Bilateral 6/07, 9/07   right, then left   [place]     Left leg-1977   NASAL SEPTOPLASTY W/ TURBINOPLASTY Bilateral 07/19/2018   Procedure: NASAL SEPTOPLASTY WITH TURBINATE REDUCTION;  Surgeon: Jerrell Belfast, MD;  Location: [place];  Service: ENT;  Laterality: Bilateral;   NASAL SEPTUM SURGERY     TOTAL KNEE ARTHROPLASTY  03/18/2012   Procedure: TOTAL KNEE ARTHROPLASTY;  Surgeon: Gearlean Alf, MD;  [facility];  Service:  Orthopedics;  Laterality: Left;   TOTAL KNEE ARTHROPLASTY Right 07/23/2017   Procedure: RIGHT TOTAL KNEE ARTHROPLASTY;  Surgeon: Gaynelle Arabian, MD;  Location[facility];  Service: Orthopedics;  Laterality: Right;    There were no vitals filed for this visit.   Subjective Assessment - 03/15/21 1149     Subjective The urge helps sometimes and the kegel is hard when exhaling    Patient Stated Goals Get stronger and stop leakage    Currently in Pain? No/denies                               [facility] Adult PT Treatment/Exercise - 03/15/21 0001       Neuro Re-ed    Neuro Re-ed Details  breathing with ribcage movements      Lumbar Exercises: Stretches   Lower Trunk Rotation Limitations hip rotation stretch      Lumbar Exercises: Supine   AB Set Limitations kegel in hooklying    Clam 15 reps    Clam Limitations green loop    Bridge with [employer] 15 reps  Straight Leg Raise 10 reps    Other Supine Lumbar Exercises cues for breathing during all exercises      Manual Therapy   Manual Therapy Myofascial release;Joint mobilization    Joint Mobilization ribcage compression with exhale    Myofascial Release posterior and anterior ribcage with breathing                      PT Short Term Goals - 03/15/21 1245       PT SHORT TERM GOAL #1   Title pt will know how to safely stretch hips for improved ability to perform functional strengthening    Status Achieved      PT SHORT TERM GOAL #2   Title Pt will improve single leg standing to at least 8 seconds without UE support    Status On-going      PT SHORT TERM GOAL #3   Title ind with initial HEP    Status Achieved               PT Long Term Goals - 01/27/21 1154       PT LONG TERM GOAL #1   Title Pt will be ind with advanced HEP    Time 12    Period Weeks    Status New    Target Date 04/21/21      PT LONG TERM GOAL #2   Title Pt will report leakage reduced to 1-2x/month due to  improved endurance of pelvic floor to 10 seconds    Baseline leaking 1/day and 2 second hold    Time 12    Period Weeks    Status New    Target Date 04/21/21      PT LONG TERM GOAL #3   Title Pt will demonstrate improved LE strength and stability with SLS for at least 12 seconds without UE support    Time 12    Period Weeks    Status New    Target Date 04/21/21      PT LONG TERM GOAL #4   Title Pt will report she does not have to stop to use the restroom just in case due to improved confidence of bladder control    Time 12    Period Weeks    Status New    Target Date 04/21/21                   Plan - 03/15/21 1239     Clinical Impression Statement Pt needed a lot of cues for breathing with correct ribcage movements.  Pt did well with tactile cues and gave exercises to work on compression for more movement during exhale so that she can access the obliques more easily.  Pt continues to have a hard time adding kegel along with breathing but she will continue to work on this.  Pt wil benefit from skilled PT to continue to address strength and functional activities without leakage.    PT Treatment/Interventions ADLs/Self Care Home Management;Biofeedback;Cryotherapy;Electrical Stimulation;Moist Heat;Therapeutic exercise;Therapeutic activities;Neuromuscular re-education;Patient/family education;Manual techniques;Taping;Dry needling;Passive range of motion    PT Next Visit Plan f/u on HEP (ribcage breathing), progress core and kegels with hip strength shoulder ext    PT Home Exercise Plan urge, Access Code: [email or web address]    Consulted and Agree with Plan of Care Patient             Patient will benefit from skilled therapeutic intervention in order to improve the following deficits and impairments:  Abnormal gait, Decreased balance, Increased muscle spasms, Postural dysfunction, Impaired flexibility, Decreased strength, Decreased coordination, Decreased endurance, Decreased range of  motion  Visit Diagnosis: Muscle weakness (generalized)  Unspecified lack of coordination  Stiffness of right hip, not elsewhere classified  Stiffness of left hip, not elsewhere classified     Problem List Patient Active Problem List   Diagnosis Date Noted   Postmenopausal 12/05/2020   Urge incontinence of urine 12/05/2020   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   Age-related osteoporosis without current pathological fracture 12/02/2020   Deviated septum 10/04/2016   Nasal turbinate hypertrophy 10/04/2016   Itchy eyes 09/13/2016   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 smear of cervix 01/24/2016   Postop Hyponatremia 03/20/2012   OA (osteoarthritis) of knee 03/18/2012   Sinusitis, chronic 11/25/2008   Obstructive sleep apnea 06/14/2007   HYPERTENSION 06/14/2007   Seasonal and perennial allergic rhinitis 06/14/2007   Allergic asthma, mild persistent, uncomplicated 96/72/8979   GERD 06/14/2007    Camillo Flaming Mimie Goering, PT 03/15/2021, 12:48 PM  [facility] [address] [place], [place], [address] Phone: [phone]   Fax:  [phone]  Name: [name] MRN: [id number] Date of Birth: 30-Oct-1950

## 2021-03-22 ENCOUNTER — Encounter: Payer: Self-pay | Admitting: Physical Therapy

## 2021-03-22 ENCOUNTER — Other Ambulatory Visit: Payer: Self-pay

## 2021-03-22 ENCOUNTER — Ambulatory Visit: Payer: Medicare PPO | Admitting: Physical Therapy

## 2021-03-22 DIAGNOSIS — M25651 Stiffness of right hip, not elsewhere classified: Secondary | ICD-10-CM | POA: Diagnosis not present

## 2021-03-22 DIAGNOSIS — M6281 Muscle weakness (generalized): Secondary | ICD-10-CM | POA: Diagnosis not present

## 2021-03-22 DIAGNOSIS — M25652 Stiffness of left hip, not elsewhere classified: Secondary | ICD-10-CM

## 2021-03-22 DIAGNOSIS — R279 Unspecified lack of coordination: Secondary | ICD-10-CM | POA: Diagnosis not present

## 2021-03-22 NOTE — Patient Instructions (Signed)
Access Code: [email or web address] URL: [email or web address] Date: 03/22/2021 Prepared by: Jari Favre  Exercises Supine Hip Internal and External Rotation - 1 x daily - 7 x weekly - 1 sets - 10 reps - 10 sec hold Supine Single Knee to Chest - 1 x daily - 7 x weekly - 1 sets - 10 reps - 10 sec hold Supine Figure 4 Piriformis Stretch - 1 x daily - 7 x weekly - 1 sets - 3 reps - 30 sec hold Supine Pelvic Floor Contraction - 3 x daily - 7 x weekly - 1 sets - 10 reps - 3 sec hold Mini Squat with Pelvic Floor Contraction - 1 x daily - 7 x weekly - 2 sets - 10 reps Supine Breathing with Hands on Ribcage - 1 x daily - 7 x weekly - 3 sets - 10 reps Supine 90/90 Shoulder Flexion with Abdominal Bracing - 1 x daily - 7 x weekly - 3 sets - 10 reps Supine Hamstring Stretch - 1 x daily - 7 x weekly - 3 reps - 1 sets - 30 sec hold Standing Hip Abduction - 1 x daily - 7 x weekly - 3 sets - 10 reps

## 2021-03-22 NOTE — Therapy (Signed)
[facility] [address], [place], [place], [address] Phone: [phone]   Fax:  [phone]  Physical Therapy Treatment  Patient Details  Name: [name] MRN: [id number] Date of Birth: 07/22/1951 Referring Provider (PT): Megan Salon, MD   Encounter Date: 03/22/2021   PT End of Session - 03/22/21 1156     Visit Number 4    Date for PT Re-Evaluation 04/21/21    Authorization Type [employer] - 8 visits until 8/18    Authorization - Visit Number 4    Authorization - Number of Visits 8    PT Start Time 2706    PT Stop Time 1228    PT Time Calculation (min) 40 min    Activity Tolerance Patient tolerated treatment well    Behavior During Therapy [facility] for tasks assessed/performed             Past Medical History:  Diagnosis Date   Acid reflux    Anemia    "during the time I was taking meloxicam"   Arthritis    Asthma    LOV  6/13  Dr Annamaria Boots EPIC/ clearance on chart  from 10/12   Depression    Dysrhythmia    PVC's with caffeine and anxiety   Heart murmur    since childhood   Hiatal hernia    High cholesterol    Hypertension    OV with clearance and note Dr Addison Lank 6/13 chart   Osteoporosis    Sleep apnea    no CPAP/ last study 9 yrs ago- "mild per patient"   STD (sexually transmitted disease)    HSV2   Stye 06/2009   eye    Past Surgical History:  Procedure Laterality Date   bilateral hip replacement Bilateral 6/07, 9/07   right, then left   [place]     Left leg-1977   NASAL SEPTOPLASTY W/ TURBINOPLASTY Bilateral 07/19/2018   Procedure: NASAL SEPTOPLASTY WITH TURBINATE REDUCTION;  Surgeon: Jerrell Belfast, MD;  Location: [place];  Service: ENT;  Laterality: Bilateral;   NASAL SEPTUM SURGERY     TOTAL KNEE ARTHROPLASTY  03/18/2012   Procedure: TOTAL KNEE ARTHROPLASTY;  Surgeon: Gearlean Alf, MD;  [facility];  Service:  Orthopedics;  Laterality: Left;   TOTAL KNEE ARTHROPLASTY Right 07/23/2017   Procedure: RIGHT TOTAL KNEE ARTHROPLASTY;  Surgeon: Gaynelle Arabian, MD;  [facility];  Service: Orthopedics;  Laterality: Right;    There were no vitals filed for this visit.   Subjective Assessment - 03/22/21 1455     Subjective Still working on breathing and still having leakage    Patient Stated Goals Get stronger and stop leakage    Currently in Pain? No/denies                               [facility] Adult PT Treatment/Exercise - 03/22/21 0001       Neuro Re-ed    Neuro Re-ed Details  breathing with ribcage movements      Lumbar Exercises: Aerobic   UBE (Upper Arm Bike) L1 x 4 back      Lumbar Exercises: Standing   Other Standing Lumbar Exercises shoulder flex and ext, standing hip abduction - 20x      Lumbar Exercises: Supine   AB Set Limitations kegel in hooklying  Bent Knee Raise 10 reps    Large Ball Abdominal Isometric 20 reps   with kegel on exhale                   PT Education - 03/22/21 1227     Education Details Access Code: [email or web address]    Person(s) Educated Patient    Methods Explanation;Demonstration;Tactile cues;Verbal cues;Handout    Comprehension Returned demonstration;Verbalized understanding              PT Short Term Goals - 03/15/21 1245       PT SHORT TERM GOAL #1   Title pt will know how to safely stretch hips for improved ability to perform functional strengthening    Status Achieved      PT SHORT TERM GOAL #2   Title Pt will improve single leg standing to at least 8 seconds without UE support    Status On-going      PT SHORT TERM GOAL #3   Title ind with initial HEP    Status Achieved               PT Long Term Goals - 01/27/21 1154       PT LONG TERM GOAL #1   Title Pt will be ind with advanced HEP    Time 12    Period Weeks    Status New    Target Date 04/21/21      PT LONG TERM GOAL #2   Title Pt will  report leakage reduced to 1-2x/month due to improved endurance of pelvic floor to 10 seconds    Baseline leaking 1/day and 2 second hold    Time 12    Period Weeks    Status New    Target Date 04/21/21      PT LONG TERM GOAL #3   Title Pt will demonstrate improved LE strength and stability with SLS for at least 12 seconds without UE support    Time 12    Period Weeks    Status New    Target Date 04/21/21      PT LONG TERM GOAL #4   Title Pt will report she does not have to stop to use the restroom just in case due to improved confidence of bladder control    Time 12    Period Weeks    Status New    Target Date 04/21/21                   Plan - 03/22/21 1244     Clinical Impression Statement Pt did well with the ball overhead to engage the pelvic floor correctly.  Pt was able to progress hip strength in single leg as well.  Pt is not noticing any changes with leakage and still needing a lot of cues to exhale with coordinating core and pelvci floor.  She is expected to continue to make progress with skilled PT.    PT Treatment/Interventions ADLs/Self Care Home Management;Biofeedback;Cryotherapy;Electrical Stimulation;Moist Heat;Therapeutic exercise;Therapeutic activities;Neuromuscular re-education;Patient/family education;Manual techniques;Taping;Dry needling;Passive range of motion    PT Next Visit Plan possibly re-assess for pelvic floor strength and endurance if no change in symptoms. progress hip strength and add shoulder ext to HEP, thoracic ext with ball behind, staning lift with exhale    PT Home Exercise Plan urge, Access Code: [email or web address]    Consulted and Agree with Plan of Care Patient             Patient will benefit  from skilled therapeutic intervention in order to improve the following deficits and impairments:  Abnormal gait, Decreased balance, Increased muscle spasms, Postural dysfunction, Impaired flexibility, Decreased strength, Decreased coordination,  Decreased endurance, Decreased range of motion  Visit Diagnosis: Muscle weakness (generalized)  Unspecified lack of coordination  Stiffness of right hip, not elsewhere classified  Stiffness of left hip, not elsewhere classified     Problem List Patient Active Problem List   Diagnosis Date Noted   Postmenopausal 12/05/2020   Urge incontinence of urine 12/05/2020   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   Age-related osteoporosis without current pathological fracture 12/02/2020   Deviated septum 10/04/2016   Nasal turbinate hypertrophy 10/04/2016   Itchy eyes 09/13/2016   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 smear of cervix 01/24/2016   Postop Hyponatremia 03/20/2012   OA (osteoarthritis) of knee 03/18/2012   Sinusitis, chronic 11/25/2008   Obstructive sleep apnea 06/14/2007   HYPERTENSION 06/14/2007   Seasonal and perennial allergic rhinitis 06/14/2007   Allergic asthma, mild persistent, uncomplicated 26/71/2458   GERD 06/14/2007    Camillo Flaming Cyara Devoto, PT 03/22/2021, 2:55 PM  [facility] [address], [place], [place], [address] Phone: [phone]   Fax:  [phone]  Name: THORA SCHERMAN MRN: [id number] Date of Birth: Jan 17, 1951

## 2021-03-28 ENCOUNTER — Encounter (HOSPITAL_BASED_OUTPATIENT_CLINIC_OR_DEPARTMENT_OTHER): Payer: Medicare PPO | Admitting: Dietician

## 2021-03-28 ENCOUNTER — Other Ambulatory Visit: Payer: Self-pay

## 2021-03-28 DIAGNOSIS — R7303 Prediabetes: Secondary | ICD-10-CM | POA: Diagnosis not present

## 2021-03-28 NOTE — Progress Notes (Signed)
On 03/28/2021 patient completed a post core session of the Diabetes Prevention Program course virtually with [employer]. By the end of this session patients are able to complete the following objectives:   Virtual Visit via Video Note  I connected with [name]. [name], 03-15-1951 by a video enabled application and verified that I am speaking with the correct person using two identifiers.  Location: Patient: Virtual Provider: Office  Learning Objectives: List ways to make recipes healthier.  List lower-fat and lower-calorie substitutions for common ingredients.  Identify low-fat cooking methods.  Describe how to choose a cookbook that works best for their needs.   Goals:  Record weight taken outside of class.  Track foods and beverages eaten each day in the "Food and Activity Tracker," including calories and fat grams for each item.   Track activity type, minutes you were active, and distance you reached each day in the "Food and Activity Tracker."   Follow-Up Plan: Attend next session.  Email completed "Food and Activity Trackers" before next session to be reviewed by Lifestyle Coach.

## 2021-03-29 ENCOUNTER — Encounter: Payer: Self-pay | Admitting: Physical Therapy

## 2021-03-29 ENCOUNTER — Ambulatory Visit: Payer: Medicare PPO | Admitting: Physical Therapy

## 2021-03-29 ENCOUNTER — Other Ambulatory Visit: Payer: Self-pay

## 2021-03-29 DIAGNOSIS — M25651 Stiffness of right hip, not elsewhere classified: Secondary | ICD-10-CM | POA: Diagnosis not present

## 2021-03-29 DIAGNOSIS — M25652 Stiffness of left hip, not elsewhere classified: Secondary | ICD-10-CM

## 2021-03-29 DIAGNOSIS — M6281 Muscle weakness (generalized): Secondary | ICD-10-CM | POA: Diagnosis not present

## 2021-03-29 DIAGNOSIS — R279 Unspecified lack of coordination: Secondary | ICD-10-CM

## 2021-03-29 NOTE — Therapy (Signed)
[facility] [address] [place], [place], [address] Phone: [phone]   Fax:  [phone]  Physical Therapy Treatment  Patient Details  Name: [name] MRN: [id number] Date of Birth: 1951-08-31 Referring Provider (PT): Megan Salon, MD   Encounter Date: 03/29/2021   PT End of Session - 03/29/21 1022     Visit Number 5    Date for PT Re-Evaluation 04/21/21    Authorization Type [employer] - 8 visits until 8/18    Authorization - Visit Number 5    Authorization - Number of Visits 8    PT Start Time 1022   late   PT Stop Time 1100    PT Time Calculation (min) 38 min    Activity Tolerance Patient tolerated treatment well    Behavior During Therapy [facility] for tasks assessed/performed             Past Medical History:  Diagnosis Date   Acid reflux    Anemia    "during the time I was taking meloxicam"   Arthritis    Asthma    LOV  6/13  Dr Annamaria Boots EPIC/ clearance on chart  from 10/12   Depression    Dysrhythmia    PVC's with caffeine and anxiety   Heart murmur    since childhood   Hiatal hernia    High cholesterol    Hypertension    OV with clearance and note Dr Addison Lank 6/13 chart   Osteoporosis    Sleep apnea    no CPAP/ last study 9 yrs ago- "mild per patient"   STD (sexually transmitted disease)    HSV2   Stye 06/2009   eye    Past Surgical History:  Procedure Laterality Date   bilateral hip replacement Bilateral 6/07, 9/07   right, then left   [place]     Left leg-1977   NASAL SEPTOPLASTY W/ TURBINOPLASTY Bilateral 07/19/2018   Procedure: NASAL SEPTOPLASTY WITH TURBINATE REDUCTION;  Surgeon: Jerrell Belfast, MD;  Location: [place];  Service: ENT;  Laterality: Bilateral;   NASAL SEPTUM SURGERY     TOTAL KNEE ARTHROPLASTY  03/18/2012   Procedure: TOTAL KNEE ARTHROPLASTY;  Surgeon: Gearlean Alf, MD;  [facility];  Service:  Orthopedics;  Laterality: Left;   TOTAL KNEE ARTHROPLASTY Right 07/23/2017   Procedure: RIGHT TOTAL KNEE ARTHROPLASTY;  Surgeon: Gaynelle Arabian, MD;  [facility];  Service: Orthopedics;  Laterality: Right;    There were no vitals filed for this visit.   Subjective Assessment - 03/29/21 1037     Subjective I am not having as many urgent situations, but I still have leakage sometimes before I void.                               [facility] Adult PT Treatment/Exercise - 03/29/21 0001       Lumbar Exercises: Stretches   Active Hamstring Stretch Right;Left;30 seconds    Lower Trunk Rotation Limitations hip rotation stretch      Lumbar Exercises: Standing   Row Strengthening;20 reps;Theraband    Theraband Level (Row) Level 4 (Blue)    Shoulder Extension Strengthening;20 reps;Theraband    Theraband Level (Shoulder Extension) Level 2 (Red)      Lumbar Exercises: Supine   Large Ball Abdominal Isometric 20 reps   with kegel on  exhale   Other Supine Lumbar Exercises cues for breathing during all exercises    Other Supine Lumbar Exercises kegel with breathing and shoulder ext and hand press on knees                    PT Education - 03/29/21 1104     Education Details Access Code: [email or web address]    Person(s) Educated Patient    Methods Explanation;Demonstration;Tactile cues;Verbal cues;Handout    Comprehension Verbalized understanding;Returned demonstration              PT Short Term Goals - 03/15/21 1245       PT SHORT TERM GOAL #1   Title pt will know how to safely stretch hips for improved ability to perform functional strengthening    Status Achieved      PT SHORT TERM GOAL #2   Title Pt will improve single leg standing to at least 8 seconds without UE support    Status On-going      PT SHORT TERM GOAL #3   Title ind with initial HEP    Status Achieved               PT Long Term Goals - 03/29/21 1109       PT LONG TERM GOAL #1    Title Pt will be ind with advanced HEP    Status On-going      PT LONG TERM GOAL #2   Title Pt will report leakage reduced to 1-2x/month due to improved endurance of pelvic floor to 10 seconds    Status On-going      PT LONG TERM GOAL #3   Title Pt will demonstrate improved LE strength and stability with SLS for at least 12 seconds without UE support    Status On-going                   Plan - 03/29/21 1105     Clinical Impression Statement Pt is doing better overall having less urgency.  Pt is still having difficulty with breathing and exhale with exertion.  Pt did well with the exercises given today and was able to correctly engage core and maintain improved posture with breathing correctly.  Exercises were added to HEP. Pt will benefit from skilled PT to continue to work on coordination and pelvic strength.    PT Treatment/Interventions ADLs/Self Care Home Management;Biofeedback;Cryotherapy;Electrical Stimulation;Moist Heat;Therapeutic exercise;Therapeutic activities;Neuromuscular re-education;Patient/family education;Manual techniques;Taping;Dry needling;Passive range of motion    PT Next Visit Plan pelvic floor coordination with exercises, work on endurance, hip strength, lift with exhale, thoracic ext with ball behind    PT Home Exercise Plan urge, Access Code: [email or web address]    Consulted and Agree with Plan of Care Patient             Patient will benefit from skilled therapeutic intervention in order to improve the following deficits and impairments:  Abnormal gait, Decreased balance, Increased muscle spasms, Postural dysfunction, Impaired flexibility, Decreased strength, Decreased coordination, Decreased endurance, Decreased range of motion  Visit Diagnosis: Muscle weakness (generalized)  Unspecified lack of coordination  Stiffness of right hip, not elsewhere classified  Stiffness of left hip, not elsewhere classified     Problem List Patient Active Problem List    Diagnosis Date Noted   Postmenopausal 12/05/2020   Urge incontinence of urine 12/05/2020   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   Age-related osteoporosis without current pathological fracture 12/02/2020   Deviated septum 10/04/2016  Nasal turbinate hypertrophy 10/04/2016   Itchy eyes 09/13/2016   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 smear of cervix 01/24/2016   Postop Hyponatremia 03/20/2012   OA (osteoarthritis) of knee 03/18/2012   Sinusitis, chronic 11/25/2008   Obstructive sleep apnea 06/14/2007   HYPERTENSION 06/14/2007   Seasonal and perennial allergic rhinitis 06/14/2007   Allergic asthma, mild persistent, uncomplicated [id number]   GERD 06/14/2007    Camillo Flaming Jacody Beneke, PT 03/29/2021, 11:13 AM  [facility] [address] [place], [place], [address] Phone: [phone]   Fax:  [phone]  Name: [name] MRN: [id number] Date of Birth: Apr 12, 1951

## 2021-03-29 NOTE — Patient Instructions (Signed)
Access Code: [email or web address] URL: [email or web address] Date: 03/29/2021 Prepared by: Jari Favre  Exercises Supine Hip Internal and External Rotation - 1 x daily - 7 x weekly - 1 sets - 10 reps - 10 sec hold Supine Single Knee to Chest - 1 x daily - 7 x weekly - 1 sets - 10 reps - 10 sec hold Supine Figure 4 Piriformis Stretch - 1 x daily - 7 x weekly - 1 sets - 3 reps - 30 sec hold Supine Pelvic Floor Contraction - 3 x daily - 7 x weekly - 1 sets - 10 reps - 3 sec hold Mini Squat with Pelvic Floor Contraction - 1 x daily - 7 x weekly - 2 sets - 10 reps Supine 90/90 Shoulder Flexion with Abdominal Bracing - 1 x daily - 7 x weekly - 3 sets - 10 reps Supine Hamstring Stretch - 1 x daily - 7 x weekly - 3 reps - 1 sets - 30 sec hold Standing Hip Abduction - 1 x daily - 7 x weekly - 3 sets - 10 reps Supine Transversus Abdominis Bracing - Hands on Thighs - 1 x daily - 7 x weekly - 1 sets - 10 reps Standing Row with Anchored Resistance - 1 x daily - 7 x weekly - 3 sets - 10 reps Shoulder extension with resistance - Neutral - 1 x daily - 7 x weekly - 3 sets - 10 reps

## 2021-03-31 NOTE — Progress Notes (Signed)
Patient ID: [name], female    DOB: 10-Jan-1951, 70 y.o.   MRN: [id number]  HPI  F never smoker folowed for asthma, allergic rhinitis, complicated by hx OSA/ oral appliance, HBP, GERD PFT 05/30/10- FEV1 2.56/ 91%, FEV1/FVC 0.73, DLCO 84%  within normal limits. NPSG-04/21/14- Moderate obstructive sleep apnea, AHI 21/ hr, CPAP to 14, weight 185 lbs  -----------------------------------------------------------------   04/01/20- 70 year old female never smoker followed for Asthma, Allergic Rhinitis, OSA-failedCPAP/ oral appliance, complicated by HBP, GERD Singulair, Advair 100-50, albuterol HFA, Lunesta Hip dislocation at Christmas- anesthesia for reduction ------   pt needs to be scheduled for sleep study Body weight today 202 lbs Had 2 phizer Covax She had septoplasty hoping to avoid need for CPAP. Still some stuffy nose at night. Often some daytime tiredness. With discussion, she decided she wants to wait for now on another sleep study. Asthma control good. Infrequent need for rescue inhaler.  04/01/21- 70 year old female never smoker followed for Asthma, Allergic Rhinitis, OSA-failedCPAP/ oral appliance, complicated by HBP, GERD -Singulair, Wixela100-50, albuterol HFA, Lunesta Body weight today 168 lbs Has lost weight with a diet/ exercise program through [facility]. No OSA concern now. Dr Toy Care helped her with insomnia giving Lunesta and clonidine for this. Asthma doing well with maintenance Wixela. Trelegy was no better. Rarely uses rescue inhaler. Can't tell if Singulair helps. So we discussed trying off it. Again asks to keep augmentin on hand in case of bronchitis/ sinusitis this winter.  Review of Systems- see HPI  + = positive Constitutional:   +  weight loss- see HPI, night sweats, fevers, chills, +fatigue, lassitude. HEENT:   +  headaches, difficulty swallowing, tooth/dental problems, sore throat,       No-  sneezing, itching, ear ache, +nasal congestion, post nasal drip,  CV:   No-   chest pain, orthopnea, PND, swelling in lower extremities, anasarca,  dizziness, palpitations Resp: No-   shortness of breath with exertion or at rest.              No-   productive cough,  No non-productive cough,  No- coughing up of blood.              No-   change in color of mucus. +wheezing.   Skin: No-   rash or lesions. GI:  +heartburn, indigestion, abdominal pain, nausea, vomiting, GU: . MS:  See HPI-  joint pain or swelling.   Neuro-     nothing unusual Psych:  No- change in mood or affect. No depression or anxiety.  No memory loss.  OBJ General- Alert, Oriented, Affect-appropriate, Distress- none acute. + Overweight Skin- rash-none, lesions- none, excoriation- none Lymphadenopathy- none Head- atraumatic            Eyes- Gross vision intact, PERRLA, conjunctivae clear            Ears- Hearing, canals-normal            Nose- mucus, no-polyps, erosion, perforation             Throat- Mallampati III , mucosa clear , drainage- none, tonsils- atrophic, own teeth Neck- flexible , trachea midline, no stridor , thyroid nl, carotid no bruit Chest - symmetrical excursion , unlabored           Heart/CV- RRR , no murmur , no gallop  , no rub, nl s1 s2                           -  JVD- none , edema- none, stasis changes- none, varices- none           Lung-  Wheeze-none cough- none , dullness-none, rub- none           Chest wall-  Abd-  Br/ Gen/ Rectal- Not done, not indicated Extrem- cyanosis- none, clubbing, none, atrophy- none, strength- nl Neuro- grossly intact to observation

## 2021-04-01 ENCOUNTER — Encounter: Payer: Self-pay | Admitting: Internal Medicine

## 2021-04-01 ENCOUNTER — Other Ambulatory Visit: Payer: Self-pay

## 2021-04-01 ENCOUNTER — Ambulatory Visit: Payer: Medicare PPO | Admitting: Internal Medicine

## 2021-04-01 DIAGNOSIS — G4733 Obstructive sleep apnea (adult) (pediatric): Secondary | ICD-10-CM

## 2021-04-01 DIAGNOSIS — J4530 Mild persistent asthma, uncomplicated: Secondary | ICD-10-CM

## 2021-04-01 DIAGNOSIS — J343 Hypertrophy of nasal turbinates: Secondary | ICD-10-CM

## 2021-04-01 MED ORDER — FLUTICASONE PROPIONATE 50 MCG/ACT NA SUSP: 2.0000 | Freq: Every evening | NASAL | 3 refills | Status: DC

## 2021-04-01 MED ORDER — AMOXICILLIN-POT CLAVULANATE 875-125 MG PO TABS: 1.0000 | ORAL_TABLET | Freq: Two times a day (BID) | ORAL | 1 refills | Status: DC

## 2021-04-01 MED ORDER — FLUTICASONE-SALMETEROL 100-50 MCG/ACT IN AEPB: 1.0000 | INHALATION_SPRAY | Freq: Two times a day (BID) | RESPIRATORY_TRACT | 12 refills | Status: DC

## 2021-04-01 MED ORDER — AMOXICILLIN-POT CLAVULANATE 875-125 MG PO TABS: 1.0000 | ORAL_TABLET | Freq: Two times a day (BID) | ORAL | 1 refills | Status: AC

## 2021-04-01 MED ORDER — ALBUTEROL SULFATE HFA 108 (90 BASE) MCG/ACT IN AERS: INHALATION_SPRAY | RESPIRATORY_TRACT | 12 refills | Status: DC

## 2021-04-01 NOTE — Assessment & Plan Note (Signed)
Always stuffy. Has Flonase.

## 2021-04-01 NOTE — Assessment & Plan Note (Signed)
Probably resolved with weight loss.  We will watch this, but shee had not tolerated CPAP or oral appliance.

## 2021-04-01 NOTE — Patient Instructions (Signed)
Scripts sent refilling Wixela, Flonase, Augmentin, Ventolin  You can try off Singulair and see if you want to refill it.

## 2021-04-01 NOTE — Assessment & Plan Note (Signed)
Uncomplicated, well-controlled Plan- refill Wixela and albuterol. Try off Singulair,

## 2021-04-05 ENCOUNTER — Encounter: Payer: Self-pay | Admitting: Physical Therapy

## 2021-04-05 ENCOUNTER — Other Ambulatory Visit: Payer: Self-pay

## 2021-04-05 ENCOUNTER — Ambulatory Visit: Payer: Medicare PPO | Attending: Nurse Practitioner | Admitting: Physical Therapy

## 2021-04-05 DIAGNOSIS — R279 Unspecified lack of coordination: Secondary | ICD-10-CM | POA: Diagnosis not present

## 2021-04-05 DIAGNOSIS — M25652 Stiffness of left hip, not elsewhere classified: Secondary | ICD-10-CM | POA: Insufficient documentation

## 2021-04-05 DIAGNOSIS — M6281 Muscle weakness (generalized): Secondary | ICD-10-CM | POA: Diagnosis not present

## 2021-04-05 DIAGNOSIS — M25651 Stiffness of right hip, not elsewhere classified: Secondary | ICD-10-CM | POA: Insufficient documentation

## 2021-04-05 NOTE — Therapy (Signed)
[facility] [address], [place], [place], [address] Phone: [phone]   Fax:  [phone]  Physical Therapy Treatment  Patient Details  Name: [name] MRN: [id number] Date of Birth: August 30, 1951 Referring Provider (PT): Megan Salon, MD   Encounter Date: 04/05/2021   PT End of Session - 04/05/21 1511     Visit Number 6    Date for PT Re-Evaluation 04/21/21    Authorization Type [employer] - 8 visits until 8/18    Authorization - Visit Number 6    Authorization - Number of Visits 8    PT Start Time 1101    PT Stop Time 3716    PT Time Calculation (min) 40 min    Activity Tolerance Patient tolerated treatment well    Behavior During Therapy [facility] for tasks assessed/performed             Past Medical History:  Diagnosis Date   Acid reflux    Anemia    "during the time I was taking meloxicam"   Arthritis    Asthma    LOV  6/13  Dr Annamaria Boots EPIC/ clearance on chart  from 10/12   Depression    Dysrhythmia    PVC's with caffeine and anxiety   Heart murmur    since childhood   Hiatal hernia    High cholesterol    Hypertension    OV with clearance and note Dr Addison Lank 6/13 chart   Osteoporosis    Sleep apnea    no CPAP/ last study 9 yrs ago- "mild per patient"   STD (sexually transmitted disease)    HSV2   Stye 06/2009   eye    Past Surgical History:  Procedure Laterality Date   bilateral hip replacement Bilateral 6/07, 9/07   right, then left   [place]     Left leg-1977   NASAL SEPTOPLASTY W/ TURBINOPLASTY Bilateral 07/19/2018   Procedure: NASAL SEPTOPLASTY WITH TURBINATE REDUCTION;  Surgeon: Jerrell Belfast, MD;  Location: [facility];  Service: ENT;  Laterality: Bilateral;   NASAL SEPTUM SURGERY     TOTAL KNEE ARTHROPLASTY  03/18/2012   Procedure: TOTAL KNEE ARTHROPLASTY;  Surgeon: Gearlean Alf, MD;  Location: [facility];  Service:  Orthopedics;  Laterality: Left;   TOTAL KNEE ARTHROPLASTY Right 07/23/2017   Procedure: RIGHT TOTAL KNEE ARTHROPLASTY;  Surgeon: Gaynelle Arabian, MD;  Location: [facility];  Service: Orthopedics;  Laterality: Right;    There were no vitals filed for this visit.   Subjective Assessment - 04/05/21 1106     Subjective On the whole I am less urgent.  I drink a lot in the morning so I go more frequently maybe one hour    Currently in Pain? No/denies                               [facility] Adult PT Treatment/Exercise - 04/05/21 0001       Lumbar Exercises: Stretches   Other Lumbar Stretch Exercise thoracic ext and rotation on ball      Lumbar Exercises: Aerobic   UBE (Upper Arm Bike) L1 x 4 back      Lumbar Exercises: Standing   Shoulder Extension Strengthening;20 reps;Theraband    Theraband Level (Shoulder Extension) Level 1 (Yellow)   reduced to yellow due to wrist hurting  Lumbar Exercises: Supine   Other Supine Lumbar Exercises cues for breathing during all exercises                      PT Short Term Goals - 03/15/21 1245       PT SHORT TERM GOAL #1   Title pt will know how to safely stretch hips for improved ability to perform functional strengthening    Status Achieved      PT SHORT TERM GOAL #2   Title Pt will improve single leg standing to at least 8 seconds without UE support    Status On-going      PT SHORT TERM GOAL #3   Title ind with initial HEP    Status Achieved               PT Long Term Goals - 04/05/21 1112       PT LONG TERM GOAL #1   Title Pt will be ind with advanced HEP    Status On-going      PT LONG TERM GOAL #2   Title Pt will report leakage reduced to 1-2x/month due to improved endurance of pelvic floor to 10 seconds    Baseline leaking maybe 2xper day instead of every day;      PT LONG TERM GOAL #3   Title Pt will demonstrate improved LE strength and stability with SLS for at least 12 seconds without UE  support      PT LONG TERM GOAL #4   Title Pt will report she does not have to stop to use the restroom just in case due to improved confidence of bladder control    Baseline I am not going as often but still go frequently    Status On-going                   Plan - 04/05/21 1132     Clinical Impression Statement Pt has had less leakage down to 2x/week.  Pt did well with balance and met goals for single leg stand.  Pt is still working towards functional goals and based on recent progress is expected to continue to make progress.    PT Treatment/Interventions ADLs/Self Care Home Management;Biofeedback;Cryotherapy;Electrical Stimulation;Moist Heat;Therapeutic exercise;Therapeutic activities;Neuromuscular re-education;Patient/family education;Manual techniques;Taping;Dry needling;Passive range of motion    PT Next Visit Plan f/u on timed stream, hip strength and stability, thoracic and upper back strength, core strength    PT Home Exercise Plan urge, Access Code: [email or web address]    Consulted and Agree with Plan of Care Patient             Patient will benefit from skilled therapeutic intervention in order to improve the following deficits and impairments:  Abnormal gait, Decreased balance, Increased muscle spasms, Postural dysfunction, Impaired flexibility, Decreased strength, Decreased coordination, Decreased endurance, Decreased range of motion  Visit Diagnosis: Muscle weakness (generalized)  Unspecified lack of coordination  Stiffness of right hip, not elsewhere classified  Stiffness of left hip, not elsewhere classified     Problem List Patient Active Problem List   Diagnosis Date Noted   Postmenopausal 12/05/2020   Urge incontinence of urine 12/05/2020   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   Age-related osteoporosis without current pathological fracture 12/02/2020   Deviated septum 10/04/2016   Nasal turbinate hypertrophy 10/04/2016   Itchy eyes  09/13/2016   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 smear of cervix 01/24/2016   Postop Hyponatremia 03/20/2012  OA (osteoarthritis) of knee 03/18/2012   Sinusitis, chronic 11/25/2008   Obstructive sleep apnea 06/14/2007   HYPERTENSION 06/14/2007   Seasonal and perennial allergic rhinitis 06/14/2007   Allergic asthma, mild persistent, uncomplicated 33/00/7622   GERD 06/14/2007    Jule Ser, PT 04/05/2021, 3:29 PM  [facility] [address], [place], [place], [address] Phone: [phone]   Fax:  [phone]  Name: [name] MRN: [id number] Date of Birth: 1951-03-13

## 2021-04-11 ENCOUNTER — Encounter: Payer: Medicare PPO | Attending: Family Medicine | Admitting: Dietician

## 2021-04-11 ENCOUNTER — Other Ambulatory Visit: Payer: Self-pay

## 2021-04-11 DIAGNOSIS — R7303 Prediabetes: Secondary | ICD-10-CM | POA: Diagnosis not present

## 2021-04-11 NOTE — Progress Notes (Signed)
On 04/11/2021 patient attended a virtual grocery store tour session as part of the Diabetes Prevention Program with [employer].  Virtual Visit via Video Note  I connected with [name]. [name], 11-25-1950 by a video enabled application and verified that I am speaking with the correct person using two identifiers.  Location: Patient: Virtual Provider: Office   Learning Objectives: Develop a plan for our grocery shopping experience Putting together a list How to navigate the grocery store Identify 4 main sections of the grocery store Produce Meat/Poultry/Fish Dairy  Inside Aisles Consider tips for shopping in each of these four sections Reflect on our own shopping habits Create a new goal for our next grocery shopping experience Engage in a group discussion  Goals:  Record weight taken outside of class.  Track foods and beverages eaten each day in the "Food and Activity Tracker," including calories and fat grams for each item.  Create one new goal for the next grocery shopping experience based on the information provided today  Follow-Up Plan: Attend next session.  Email completed "Food and Activity Tracker" before next session to be reviewed by Lifestyle Coach.

## 2021-04-12 DIAGNOSIS — Z6829 Body mass index (BMI) 29.0-29.9, adult: Secondary | ICD-10-CM | POA: Diagnosis not present

## 2021-04-12 DIAGNOSIS — R197 Diarrhea, unspecified: Secondary | ICD-10-CM | POA: Diagnosis not present

## 2021-04-12 DIAGNOSIS — K521 Toxic gastroenteritis and colitis: Secondary | ICD-10-CM | POA: Diagnosis not present

## 2021-04-14 DIAGNOSIS — R197 Diarrhea, unspecified: Secondary | ICD-10-CM | POA: Diagnosis not present

## 2021-04-19 ENCOUNTER — Ambulatory Visit: Payer: Medicare PPO | Admitting: Physical Therapy

## 2021-04-19 ENCOUNTER — Other Ambulatory Visit: Payer: Self-pay

## 2021-04-19 ENCOUNTER — Other Ambulatory Visit: Payer: Self-pay | Admitting: Internal Medicine

## 2021-04-19 DIAGNOSIS — M6281 Muscle weakness (generalized): Secondary | ICD-10-CM | POA: Diagnosis not present

## 2021-04-19 DIAGNOSIS — M25652 Stiffness of left hip, not elsewhere classified: Secondary | ICD-10-CM | POA: Diagnosis not present

## 2021-04-19 DIAGNOSIS — M25651 Stiffness of right hip, not elsewhere classified: Secondary | ICD-10-CM

## 2021-04-19 DIAGNOSIS — R279 Unspecified lack of coordination: Secondary | ICD-10-CM | POA: Diagnosis not present

## 2021-04-19 NOTE — Therapy (Signed)
[facility] [address], [place], [place], [address] Phone: [phone]   Fax:  [phone]  Physical Therapy Treatment  Patient Details  Name: [name] MRN: [id number] Date of Birth: 27-Nov-1950 Referring Provider (PT): Megan Salon, MD   Encounter Date: 04/19/2021   PT End of Session - 04/19/21 1152     Visit Number 7    Date for PT Re-Evaluation 05/31/21    Authorization Type [employer] - 8 visits until 8/18; SUBMITTED TODAY FOR 4 MORE    Authorization - Visit Number 7    Authorization - Number of Visits 8    PT Start Time 1110   late   PT Stop Time 3343    PT Time Calculation (min) 35 min    Activity Tolerance Patient tolerated treatment well    Behavior During Therapy [facility] for tasks assessed/performed             Past Medical History:  Diagnosis Date   Acid reflux    Anemia    "during the time I was taking meloxicam"   Arthritis    Asthma    LOV  6/13  Dr Annamaria Boots EPIC/ clearance on chart  from 10/12   Depression    Dysrhythmia    PVC's with caffeine and anxiety   Heart murmur    since childhood   Hiatal hernia    High cholesterol    Hypertension    OV with clearance and note Dr Addison Lank 6/13 chart   Osteoporosis    Sleep apnea    no CPAP/ last study 9 yrs ago- "mild per patient"   STD (sexually transmitted disease)    HSV2   Stye 06/2009   eye    Past Surgical History:  Procedure Laterality Date   bilateral hip replacement Bilateral 6/07, 9/07   right, then left   [place]     Left leg-1977   NASAL SEPTOPLASTY W/ TURBINOPLASTY Bilateral 07/19/2018   Procedure: NASAL SEPTOPLASTY WITH TURBINATE REDUCTION;  Surgeon: Jerrell Belfast, MD;  Location: [facility];  Service: ENT;  Laterality: Bilateral;   NASAL SEPTUM SURGERY     TOTAL KNEE ARTHROPLASTY  03/18/2012   Procedure: TOTAL KNEE ARTHROPLASTY;  Surgeon: Gearlean Alf, MD;   [facility];  Service: Orthopedics;  Laterality: Left;   TOTAL KNEE ARTHROPLASTY Right 07/23/2017   Procedure: RIGHT TOTAL KNEE ARTHROPLASTY;  Surgeon: Gaynelle Arabian, MD;  [facility];  Service: Orthopedics;  Laterality: Right;    There were no vitals filed for this visit.   Subjective Assessment - 04/19/21 1116     Subjective I have been going to the bathroom and it is a good 8 seconds and going at most every 2 hours.    Patient Stated Goals Get stronger and stop leakage    Currently in Pain? No/denies                [facility] PT Assessment - 04/19/21 0001       Assessment   Medical Diagnosis N39.41 (ICD-10-CM) - Urge incontinence of urine    Referring Provider (PT) Megan Salon, MD                        Pelvic Floor Special Questions - 04/19/21 0001     External Palpation can hold 5 seconds and able to breath out during contraction,  losing some control with breathing in and out               [facility] Adult PT Treatment/Exercise - 04/19/21 0001       Lumbar Exercises: Stretches   Active Hamstring Stretch Right;Left;30 seconds    Single Knee to Chest Stretch Right;Left;3 reps;10 seconds    Lower Trunk Rotation Limitations hip rotation stretch    Figure 4 Stretch 2 reps;30 seconds      Lumbar Exercises: Seated   Other Seated Lumbar Exercises thoracic extension with ball in sitting      Lumbar Exercises: Supine   AB Set Limitations kegel in hooklying with tactile cues and holding 50% contraction with breathing - holing 5 seconds and then diminished for 5 more seconds; cue for adequate rest between    Other Supine Lumbar Exercises kegel with ball squeeze; holding with breathing 10 seconds                      PT Short Term Goals - 03/15/21 1245       PT SHORT TERM GOAL #1   Title pt will know how to safely stretch hips for improved ability to perform functional strengthening    Status Achieved      PT SHORT TERM GOAL #2    Title Pt will improve single leg standing to at least 8 seconds without UE support    Status On-going      PT SHORT TERM GOAL #3   Title ind with initial HEP    Status Achieved               PT Long Term Goals - 04/19/21 1113       PT LONG TERM GOAL #1   Title Pt will be ind with advanced HEP    Time 6    Period Weeks    Status On-going    Target Date 05/31/21      PT LONG TERM GOAL #2   Title Pt will report leakage reduced to 1-2x/month due to improved endurance of pelvic floor to 10 seconds    Baseline 1x every other day and unable to hold for more than 5 seconds consistently    Time 6    Period Weeks    Status Partially Met      PT LONG TERM GOAL #3   Title Pt will demonstrate improved LE strength and stability with SLS for at least 12 seconds without UE support    Time 6    Period Weeks    Status Achieved      PT LONG TERM GOAL #4   Title Pt will report she does not have to stop to use the restroom just in case due to improved confidence of bladder control    Baseline I am not going as often but still go frequently    Time 6    Period Weeks    Status Partially Met                   Plan - 04/19/21 1156     Clinical Impression Statement Pt has made progress towards functional goals.  She is able to perform single leg stand for >12 seconds.  She is having leakage every other day instead of 2x/day.  Pt is now able to contract and hold pelvic floor muscles for 5 seconds and not holding her breath verses initially was hodling breath and contraction for only 2 seconds.  Pt  is still having leakage that is disruptive.  PT recommends continuation of treatment for several more visits that are spaced out so she has time to progress more slowly.  Pt is expected to continue to make slow and steady progress.    PT Frequency Biweekly    PT Duration 6 weeks    PT Treatment/Interventions ADLs/Self Care Home Management;Biofeedback;Cryotherapy;Electrical Stimulation;Moist  Heat;Therapeutic exercise;Therapeutic activities;Neuromuscular re-education;Patient/family education;Manual techniques;Taping;Dry needling;Passive range of motion    PT Next Visit Plan f/u on holding for 10 seconds and mayby re-assess, hip strength and stability, thoracic and upper back strength, core strength    PT Home Exercise Plan urge, Access Code: Mina             Patient will benefit from skilled therapeutic intervention in order to improve the following deficits and impairments:  Abnormal gait, Decreased balance, Increased muscle spasms, Postural dysfunction, Impaired flexibility, Decreased strength, Decreased coordination, Decreased endurance, Decreased range of motion  Visit Diagnosis: Muscle weakness (generalized)  Unspecified lack of coordination  Stiffness of right hip, not elsewhere classified  Stiffness of left hip, not elsewhere classified     Problem List Patient Active Problem List   Diagnosis Date Noted   Postmenopausal 12/05/2020   Urge incontinence of urine 12/05/2020   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   Age-related osteoporosis without current pathological fracture 12/02/2020   Deviated septum 10/04/2016   Nasal turbinate hypertrophy 10/04/2016   Itchy eyes 09/13/2016   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 smear of cervix 01/24/2016   Postop Hyponatremia 03/20/2012   OA (osteoarthritis) of knee 03/18/2012   Sinusitis, chronic 11/25/2008   Obstructive sleep apnea 06/14/2007   HYPERTENSION 06/14/2007   Seasonal and perennial allergic rhinitis 06/14/2007   Allergic asthma, mild persistent, uncomplicated 14/99/6924   GERD 06/14/2007    Camillo Flaming Juhi Lagrange, PT 04/19/2021, 12:24 PM  [facility] [address], [place], [place], [address] Phone: [phone]   Fax:  [phone]  Name: CLOTILDA HAFER MRN: [id number] Date of Birth:  09-29-1950

## 2021-04-26 ENCOUNTER — Encounter: Payer: Self-pay | Admitting: Physical Therapy

## 2021-04-26 ENCOUNTER — Ambulatory Visit: Payer: Medicare PPO | Admitting: Physical Therapy

## 2021-04-26 ENCOUNTER — Other Ambulatory Visit: Payer: Self-pay

## 2021-04-26 DIAGNOSIS — M6281 Muscle weakness (generalized): Secondary | ICD-10-CM | POA: Diagnosis not present

## 2021-04-26 DIAGNOSIS — R279 Unspecified lack of coordination: Secondary | ICD-10-CM | POA: Diagnosis not present

## 2021-04-26 DIAGNOSIS — M25652 Stiffness of left hip, not elsewhere classified: Secondary | ICD-10-CM

## 2021-04-26 DIAGNOSIS — M25651 Stiffness of right hip, not elsewhere classified: Secondary | ICD-10-CM | POA: Diagnosis not present

## 2021-04-26 NOTE — Therapy (Signed)
[facility] [address] [place], [place], [address] Phone: [phone]   Fax:  [phone]  Physical Therapy Treatment  Patient Details  Name: [name] MRN: [id number] Date of Birth: 1950/09/26 Referring Provider (PT): Megan Salon, MD   Encounter Date: 04/26/2021   PT End of Session - 04/26/21 1249     Visit Number 8    Date for PT Re-Evaluation 05/31/21    Authorization Type [employer] - 4 visits until 10/04;    Authorization - Visit Number 1    Authorization - Number of Visits 4    PT Start Time 1106    PT Stop Time 1145    PT Time Calculation (min) 39 min    Activity Tolerance Patient tolerated treatment well    Behavior During Therapy WFL for tasks assessed/performed             Past Medical History:  Diagnosis Date   Acid reflux    Anemia    "during the time I was taking meloxicam"   Arthritis    Asthma    LOV  6/13  Dr Annamaria Boots EPIC/ clearance on chart  from 10/12   Depression    Dysrhythmia    PVC's with caffeine and anxiety   Heart murmur    since childhood   Hiatal hernia    High cholesterol    Hypertension    OV with clearance and note Dr Addison Lank 6/13 chart   Osteoporosis    Sleep apnea    no CPAP/ last study 9 yrs ago- "mild per patient"   STD (sexually transmitted disease)    HSV2   Stye 06/2009   eye    Past Surgical History:  Procedure Laterality Date   bilateral hip replacement Bilateral 6/07, 9/07   right, then left   BREAST REDUCTION SURGERY     CESAREAN SECTION     FRACTURE SURGERY     Left leg-1977   NASAL SEPTOPLASTY W/ TURBINOPLASTY Bilateral 07/19/2018   Procedure: NASAL SEPTOPLASTY WITH TURBINATE REDUCTION;  Surgeon: Jerrell Belfast, MD;  Location: [place];  Service: ENT;  Laterality: Bilateral;   NASAL SEPTUM SURGERY     TOTAL KNEE ARTHROPLASTY  03/18/2012   Procedure: TOTAL KNEE ARTHROPLASTY;  Surgeon: Gearlean Alf, MD;  Location: [facility];  Service:  Orthopedics;  Laterality: Left;   TOTAL KNEE ARTHROPLASTY Right 07/23/2017   Procedure: RIGHT TOTAL KNEE ARTHROPLASTY;  Surgeon: Gaynelle Arabian, MD;  Location: [facility];  Service: Orthopedics;  Laterality: Right;    There were no vitals filed for this visit.   Subjective Assessment - 04/26/21 1253     Subjective Pt states she is still doing well still having some leakage but feels stronger.  Still hard to hold the contraction without stiffening up    Patient Stated Goals Get stronger and stop leakage    Currently in Pain? No/denies                               [facility] Adult PT Treatment/Exercise - 04/26/21 0001       Lumbar Exercises: Stretches   Active Hamstring Stretch Right;Left;30 seconds      Lumbar Exercises: Standing   Functional Squats 15 reps    Row Strengthening;20 reps;Theraband    Theraband Level (Row) Level 3 (Green)    Other Standing Lumbar Exercises hip hinge 3lb in bil UE;    Other Standing  Lumbar Exercises pallof press with yellow band and squats; hip abduction on foam; single leg dead lift with 1 UE support                    PT Education - 04/26/21 1142     Education Details Access Code: [email or web address]    Person(s) Educated Patient    Methods Explanation;Demonstration;Tactile cues;Verbal cues;Handout    Comprehension Verbalized understanding;Returned demonstration              PT Short Term Goals - 03/15/21 1245       PT SHORT TERM GOAL #1   Title pt will know how to safely stretch hips for improved ability to perform functional strengthening    Status Achieved      PT SHORT TERM GOAL #2   Title Pt will improve single leg standing to at least 8 seconds without UE support    Status On-going      PT SHORT TERM GOAL #3   Title ind with initial HEP    Status Achieved               PT Long Term Goals - 04/26/21 1125       PT LONG TERM GOAL #1   Title Pt will be ind with advanced HEP    Status On-going      PT  LONG TERM GOAL #2   Title Pt will report leakage reduced to 1-2x/month due to improved endurance of pelvic floor to 10 seconds    Status On-going      PT LONG TERM GOAL #3   Title Pt will demonstrate improved LE strength and stability with SLS for at least 12 seconds without UE support    Status Achieved      PT LONG TERM GOAL #4   Title Pt will report she does not have to stop to use the restroom just in case due to improved confidence of bladder control    Baseline I don't do that anymore    Status Achieved                   Plan - 04/26/21 1129     Clinical Impression Statement Pt is doing well with goals and has met goal for reduced JIC peeing.  Pt needed cues to keep shoulders down and back but doing much better wtih that. She was able to perform ex's on foam mat for increased core and pelvic activation.  pt was educated in dead lift and squats with correct technique.  cues needed to hinge at the hips and to keep weight even on bil LE as she leans to the Rt significantly.  Pt should continue with skilled PT to improve functional activities without leakage    PT Treatment/Interventions ADLs/Self Care Home Management;Biofeedback;Cryotherapy;Electrical Stimulation;Moist Heat;Therapeutic exercise;Therapeutic activities;Neuromuscular re-education;Patient/family education;Manual techniques;Taping;Dry needling;Passive range of motion    PT Next Visit Plan f/u on hip hinges, holding for 10 seconds and mayby re-assess, hip strength and stability, thoracic and upper back strength, core strength    PT Home Exercise Plan urge, Access Code: [email or web address]    Consulted and Agree with Plan of Care Patient             Patient will benefit from skilled therapeutic intervention in order to improve the following deficits and impairments:  Abnormal gait, Decreased balance, Increased muscle spasms, Postural dysfunction, Impaired flexibility, Decreased strength, Decreased coordination, Decreased  endurance, Decreased range of motion  Visit Diagnosis: Muscle weakness (  generalized)  Unspecified lack of coordination  Stiffness of right hip, not elsewhere classified  Stiffness of left hip, not elsewhere classified     Problem List Patient Active Problem List   Diagnosis Date Noted   Postmenopausal 12/05/2020   Urge incontinence of urine 12/05/2020   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   Age-related osteoporosis without current pathological fracture 12/02/2020   Deviated septum 10/04/2016   Nasal turbinate hypertrophy 10/04/2016   Itchy eyes 09/13/2016   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 smear of cervix 01/24/2016   Postop Hyponatremia 03/20/2012   OA (osteoarthritis) of knee 03/18/2012   Sinusitis, chronic 11/25/2008   Obstructive sleep apnea 06/14/2007   HYPERTENSION 06/14/2007   Seasonal and perennial allergic rhinitis 06/14/2007   Allergic asthma, mild persistent, uncomplicated 13/24/4010   GERD 06/14/2007    Camillo Flaming Akiera Allbaugh, PT 04/26/2021, 12:58 PM  [facility] [address], [place], [place], [address] Phone: [phone]   Fax:  [phone]  Name: [name] MRN: [id number] Date of Birth: 1951/05/18

## 2021-04-26 NOTE — Patient Instructions (Signed)
Access Code: [email or web address] URL: [email or web address] Date: 04/26/2021 Prepared by: Jari Favre  Exercises Supine Hip Internal and External Rotation - 1 x daily - 7 x weekly - 1 sets - 10 reps - 10 sec hold Supine Single Knee to Chest - 1 x daily - 7 x weekly - 1 sets - 10 reps - 10 sec hold Supine Figure 4 Piriformis Stretch - 1 x daily - 7 x weekly - 1 sets - 3 reps - 30 sec hold Supine Pelvic Floor Contraction - 3 x daily - 7 x weekly - 1 sets - 10 reps - 3 sec hold Mini Squat with Pelvic Floor Contraction - 1 x daily - 7 x weekly - 2 sets - 10 reps Supine 90/90 Shoulder Flexion with Abdominal Bracing - 1 x daily - 7 x weekly - 3 sets - 10 reps Supine Hamstring Stretch - 1 x daily - 7 x weekly - 3 reps - 1 sets - 30 sec hold Standing Hip Abduction - 1 x daily - 7 x weekly - 3 sets - 10 reps Supine Transversus Abdominis Bracing - Hands on Thighs - 1 x daily - 7 x weekly - 1 sets - 10 reps Standing Row with Anchored Resistance - 1 x daily - 7 x weekly - 3 sets - 10 reps Shoulder extension with resistance - Neutral - 1 x daily - 7 x weekly - 3 sets - 10 reps Supine Hip Adductor Squeeze with Small Ball - 3 x daily - 7 x weekly - 1 sets - 3 reps - 10 sec hold Supine Butterfly Groin Stretch - 1 x daily - 7 x weekly - 1 sets - 3 reps - 30 hold Kettlebell Deadlift - 1 x daily - 7 x weekly - 3 sets - 10 reps

## 2021-05-02 ENCOUNTER — Ambulatory Visit: Payer: Medicare PPO | Admitting: Physical Therapy

## 2021-05-12 ENCOUNTER — Other Ambulatory Visit: Payer: Self-pay

## 2021-05-12 ENCOUNTER — Ambulatory Visit: Payer: Medicare PPO | Attending: Nurse Practitioner | Admitting: Physical Therapy

## 2021-05-12 DIAGNOSIS — R279 Unspecified lack of coordination: Secondary | ICD-10-CM | POA: Insufficient documentation

## 2021-05-12 DIAGNOSIS — M25651 Stiffness of right hip, not elsewhere classified: Secondary | ICD-10-CM | POA: Insufficient documentation

## 2021-05-12 DIAGNOSIS — M25652 Stiffness of left hip, not elsewhere classified: Secondary | ICD-10-CM | POA: Diagnosis not present

## 2021-05-12 DIAGNOSIS — M6281 Muscle weakness (generalized): Secondary | ICD-10-CM | POA: Insufficient documentation

## 2021-05-12 NOTE — Therapy (Signed)
[facility] [address], [place], [place], [address] Phone: [phone]   Fax:  [phone]  Physical Therapy Treatment  Patient Details  Name: [name] MRN: [id number] Date of Birth: 02-08-1951 Referring Provider (PT): Megan Salon, MD   Encounter Date: 05/12/2021   PT End of Session - 05/12/21 1636     Visit Number 9    Date for PT Re-Evaluation 05/31/21    Authorization Type [employer] - 4 visits until 10/04;    Authorization - Visit Number 2    Authorization - Number of Visits 4    PT Start Time 8101    PT Stop Time 1310    PT Time Calculation (min) 35 min    Activity Tolerance Patient tolerated treatment well    Behavior During Therapy WFL for tasks assessed/performed             Past Medical History:  Diagnosis Date   Acid reflux    Anemia    "during the time I was taking meloxicam"   Arthritis    Asthma    LOV  6/13  Dr Annamaria Boots EPIC/ clearance on chart  from 10/12   Depression    Dysrhythmia    PVC's with caffeine and anxiety   Heart murmur    since childhood   Hiatal hernia    High cholesterol    Hypertension    OV with clearance and note Dr Addison Lank 6/13 chart   Osteoporosis    Sleep apnea    no CPAP/ last study 9 yrs ago- "mild per patient"   STD (sexually transmitted disease)    HSV2   Stye 06/2009   eye    Past Surgical History:  Procedure Laterality Date   bilateral hip replacement Bilateral 6/07, 9/07   right, then left   BREAST REDUCTION SURGERY     CESAREAN SECTION     FRACTURE SURGERY     Left leg-1977   NASAL SEPTOPLASTY W/ TURBINOPLASTY Bilateral 07/19/2018   Procedure: NASAL SEPTOPLASTY WITH TURBINATE REDUCTION;  Surgeon: Jerrell Belfast, MD;  Location: [place];  Service: ENT;  Laterality: Bilateral;   NASAL SEPTUM SURGERY     TOTAL KNEE ARTHROPLASTY  03/18/2012   Procedure: TOTAL KNEE ARTHROPLASTY;  Surgeon: Gearlean Alf, MD;  [facility];  Service:  Orthopedics;  Laterality: Left;   TOTAL KNEE ARTHROPLASTY Right 07/23/2017   Procedure: RIGHT TOTAL KNEE ARTHROPLASTY;  Surgeon: Gaynelle Arabian, MD;  Location[facility];  Service: Orthopedics;  Laterality: Right;    There were no vitals filed for this visit.   Subjective Assessment - 05/12/21 1649     Subjective Pt states she has not had leakage lately    Patient Stated Goals Get stronger and stop leakage    Multiple Pain Sites No                               [facility] Adult PT Treatment/Exercise - 05/12/21 0001       Lumbar Exercises: Stretches   Active Hamstring Stretch Right;Left;30 seconds    Single Knee to Chest Stretch Right;Left;3 reps;10 seconds    Hip Flexor Stretch Right;Left;3 reps;20 seconds      Lumbar Exercises: Machines for Strengthening   Leg Press 30 # single leg 2x15      Lumbar Exercises: Standing   Functional Squats 15 reps    Other Standing Lumbar Exercises hip hinge 3lb  in bil UE; single leg dead lift one UE support      Lumbar Exercises: Supine   AB Set Limitations kegel in hooklying up to 10 sec hold; able to breathe correctly; cues to keep from bulging                       PT Short Term Goals - 03/15/21 1245       PT SHORT TERM GOAL #1   Title pt will know how to safely stretch hips for improved ability to perform functional strengthening    Status Achieved      PT SHORT TERM GOAL #2   Title Pt will improve single leg standing to at least 8 seconds without UE support    Status On-going      PT SHORT TERM GOAL #3   Title ind with initial HEP    Status Achieved               PT Long Term Goals - 05/12/21 1242       PT LONG TERM GOAL #1   Title Pt will be ind with advanced HEP    Status On-going      PT LONG TERM GOAL #2   Title Pt will report leakage reduced to 1-2x/month due to improved endurance of pelvic floor to 10 seconds    Baseline no accidents lately and is holding 10 seconds    Status  Achieved      PT LONG TERM GOAL #3   Title Pt will demonstrate improved LE strength and stability with SLS for at least 12 seconds without UE support    Status Achieved      PT LONG TERM GOAL #4   Title Pt will report she does not have to stop to use the restroom just in case due to improved confidence of bladder control    Status Achieved                   Plan - 05/12/21 1639     Clinical Impression Statement Pt is doing well and has met long term goal for reduced leakage and able to hold pelvic floor muscles for at least 10 second hold.  Pt still needs some cues when doing multiple reps so she will not start bulging the pelvic floor. Pt still has a lot of questions about her home exericses and needs to get full comprehension of the functional movements without compensation of thoracic flexion.    PT Treatment/Interventions ADLs/Self Care Home Management;Biofeedback;Cryotherapy;Electrical Stimulation;Moist Heat;Therapeutic exercise;Therapeutic activities;Neuromuscular re-education;Patient/family education;Manual techniques;Taping;Dry needling;Passive range of motion    PT Next Visit Plan f/u on single leg dead lift; continue single leg and stability exercises, keep reviewing HEP for 2 more visits so she is confident with for d/c    PT Home Exercise Plan urge, Access Code: [email or web address]    Consulted and Agree with Plan of Care Patient             Patient will benefit from skilled therapeutic intervention in order to improve the following deficits and impairments:  Abnormal gait, Decreased balance, Increased muscle spasms, Postural dysfunction, Impaired flexibility, Decreased strength, Decreased coordination, Decreased endurance, Decreased range of motion  Visit Diagnosis: Muscle weakness (generalized)  Unspecified lack of coordination  Stiffness of right hip, not elsewhere classified  Stiffness of left hip, not elsewhere classified     Problem List Patient Active Problem  List   Diagnosis Date Noted   Postmenopausal  12/05/2020   Urge incontinence of urine 12/05/2020   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   Age-related osteoporosis without current pathological fracture 12/02/2020   Deviated septum 10/04/2016   Nasal turbinate hypertrophy 10/04/2016   Itchy eyes 09/13/2016   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 smear of cervix 01/24/2016   Postop Hyponatremia 03/20/2012   OA (osteoarthritis) of knee 03/18/2012   Sinusitis, chronic 11/25/2008   Obstructive sleep apnea 06/14/2007   HYPERTENSION 06/14/2007   Seasonal and perennial allergic rhinitis 06/14/2007   Allergic asthma, mild persistent, uncomplicated 21/79/8102   GERD 06/14/2007    [name], PT 05/12/2021, 4:50 PM  [facility] [address] [place], [place], [address] Phone: [phone]   Fax:  [phone]  Name: [name] MRN: [id number] Date of Birth: 04-12-1951

## 2021-05-16 ENCOUNTER — Other Ambulatory Visit: Payer: Self-pay

## 2021-05-16 ENCOUNTER — Encounter: Payer: Medicare PPO | Attending: Family Medicine | Admitting: Dietician

## 2021-05-16 DIAGNOSIS — R7303 Prediabetes: Secondary | ICD-10-CM | POA: Diagnosis not present

## 2021-05-16 NOTE — Progress Notes (Signed)
On 05/16/2021 patient completed a post core session of the Diabetes Prevention Program virtually with [employer]. By the end of this session patients are able to complete the following objectives:   Virtual Visit via Video Note  I connected with [name]. [name], 1951/06/24 by a video enabled application and verified that I am speaking with the correct person using two identifiers.  Location: Patient: Virtual Provider: Office  Learning Objectives: List indoor physical activity options.  Identify any barriers to being active and brainstorm how to overcome barriers.  Describe short and long-term health benefits of physical activity.   Goals:  Record weight taken outside of class.  Track foods and beverages eaten each day in the "Food and Activity Tracker," including calories and fat grams for each item.   Track activity type, minutes you were active, and distance you reached each day in the "Food and Activity Tracker."   Follow-Up Plan: Attend next session.  Email completed "Food and Activity Trackers" before next session to be reviewed by Lifestyle Coach.

## 2021-05-17 ENCOUNTER — Ambulatory Visit (HOSPITAL_BASED_OUTPATIENT_CLINIC_OR_DEPARTMENT_OTHER): Payer: Medicare PPO | Admitting: Radiology

## 2021-05-17 ENCOUNTER — Other Ambulatory Visit (HOSPITAL_BASED_OUTPATIENT_CLINIC_OR_DEPARTMENT_OTHER): Payer: Medicare PPO

## 2021-05-19 ENCOUNTER — Ambulatory Visit (HOSPITAL_BASED_OUTPATIENT_CLINIC_OR_DEPARTMENT_OTHER): Admission: RE | Admit: 2021-05-19 | Payer: Medicare PPO | Source: Ambulatory Visit

## 2021-05-19 ENCOUNTER — Ambulatory Visit (HOSPITAL_BASED_OUTPATIENT_CLINIC_OR_DEPARTMENT_OTHER): Admission: RE | Admit: 2021-05-19 | Payer: Medicare PPO | Source: Ambulatory Visit | Admitting: Radiology

## 2021-05-26 ENCOUNTER — Encounter: Payer: Medicare PPO | Admitting: Physical Therapy

## 2021-05-27 DIAGNOSIS — Z20822 Contact with and (suspected) exposure to covid-19: Secondary | ICD-10-CM | POA: Diagnosis not present

## 2021-05-27 DIAGNOSIS — Z03818 Encounter for observation for suspected exposure to other biological agents ruled out: Secondary | ICD-10-CM | POA: Diagnosis not present

## 2021-05-27 DIAGNOSIS — J00 Acute nasopharyngitis [common cold]: Secondary | ICD-10-CM | POA: Diagnosis not present

## 2021-05-27 DIAGNOSIS — R059 Cough, unspecified: Secondary | ICD-10-CM | POA: Diagnosis not present

## 2021-06-02 ENCOUNTER — Ambulatory Visit (HOSPITAL_BASED_OUTPATIENT_CLINIC_OR_DEPARTMENT_OTHER)
Admission: RE | Admit: 2021-06-02 | Discharge: 2021-06-02 | Disposition: A | Discharge status: Home / Self Care | Payer: Medicare PPO | Source: Ambulatory Visit | Attending: Obstetrics & Gynecology | Admitting: Obstetrics & Gynecology

## 2021-06-02 ENCOUNTER — Encounter (HOSPITAL_BASED_OUTPATIENT_CLINIC_OR_DEPARTMENT_OTHER): Payer: Self-pay | Admitting: Radiology

## 2021-06-02 ENCOUNTER — Other Ambulatory Visit: Payer: Self-pay

## 2021-06-02 DIAGNOSIS — Z1231 Encounter for screening mammogram for malignant neoplasm of breast: Secondary | ICD-10-CM | POA: Diagnosis not present

## 2021-06-02 DIAGNOSIS — M810 Age-related osteoporosis without current pathological fracture: Secondary | ICD-10-CM | POA: Diagnosis not present

## 2021-06-02 DIAGNOSIS — Z780 Asymptomatic menopausal state: Secondary | ICD-10-CM | POA: Diagnosis not present

## 2021-06-07 ENCOUNTER — Other Ambulatory Visit: Payer: Self-pay

## 2021-06-07 ENCOUNTER — Ambulatory Visit: Payer: Medicare PPO | Attending: Nurse Practitioner | Admitting: Physical Therapy

## 2021-06-07 DIAGNOSIS — M6281 Muscle weakness (generalized): Secondary | ICD-10-CM | POA: Diagnosis not present

## 2021-06-07 DIAGNOSIS — M25651 Stiffness of right hip, not elsewhere classified: Secondary | ICD-10-CM | POA: Diagnosis not present

## 2021-06-07 DIAGNOSIS — M25652 Stiffness of left hip, not elsewhere classified: Secondary | ICD-10-CM | POA: Diagnosis not present

## 2021-06-07 DIAGNOSIS — R279 Unspecified lack of coordination: Secondary | ICD-10-CM | POA: Diagnosis not present

## 2021-06-07 NOTE — Therapy (Signed)
[facility] @ [place], [place], [address] Phone:     Fax:     Physical Therapy Treatment Progress Note Reporting Period 01/27/21 to 06/07/21   See note below for Objective Data and Assessment of Progress/Goals.     Patient Details  Name: [name] MRN: [id number] Date of Birth: [birth year] Referring Provider (PT): Megan Salon, MD   Encounter Date: 06/07/2021   PT End of Session - 06/07/21 1208     Visit Number 10    Date for PT Re-Evaluation 06/07/21    Authorization Type [employer] - 4 visits until 10/04;    Authorization - Visit Number 3    Authorization - Number of Visits 4    PT Start Time 1771    PT Stop Time 1230    PT Time Calculation (min) 42 min    Activity Tolerance Patient tolerated treatment well    Behavior During Therapy WFL for tasks assessed/performed             Past Medical History:  Diagnosis Date   Acid reflux    Anemia    "during the time I was taking meloxicam"   Arthritis    Asthma    LOV  6/13  Dr Annamaria Boots EPIC/ clearance on chart  from 10/12   Depression    Dysrhythmia    PVC's with caffeine and anxiety   Heart murmur    since childhood   Hiatal hernia    High cholesterol    Hypertension    OV with clearance and note Dr Addison Lank 6/13 chart   Osteoporosis    Sleep apnea    no CPAP/ last study 9 yrs ago- "mild per patient"   STD (sexually transmitted disease)    HSV2   Stye 06/2009   eye    Past Surgical History:  Procedure Laterality Date   bilateral hip replacement Bilateral 6/07, 9/07   right, then left   BREAST REDUCTION SURGERY     CESAREAN SECTION     FRACTURE SURGERY     Left leg-1977   NASAL SEPTOPLASTY W/ TURBINOPLASTY Bilateral 07/19/2018   Procedure: NASAL SEPTOPLASTY WITH TURBINATE REDUCTION;  Surgeon: Jerrell Belfast, MD;  Location: [place];  Service: ENT;  Laterality: Bilateral;   NASAL SEPTUM SURGERY     REDUCTION MAMMAPLASTY      TOTAL KNEE ARTHROPLASTY  03/18/2012   Procedure: TOTAL KNEE ARTHROPLASTY;  Surgeon: Gearlean Alf, MD;  [facility];  Service: Orthopedics;  Laterality: Left;   TOTAL KNEE ARTHROPLASTY Right 07/23/2017   Procedure: RIGHT TOTAL KNEE ARTHROPLASTY;  Surgeon: Gaynelle Arabian, MD;  [facility];  Service: Orthopedics;  Laterality: Right;    There were no vitals filed for this visit.   Subjective Assessment - 06/07/21 1249     Subjective Pt is doing well.  She did not do as much of the dead lift double or single leg because she was unsure if doing correctly.    Currently in Pain? No/denies                               [facility] Adult PT Treatment/Exercise - 06/07/21 0001       Lumbar Exercises: Stretches   Active Hamstring Stretch Right;Left;30 seconds      Lumbar Exercises: Machines for Strengthening   Other Lumbar Machine Exercise low row - 40 lb 10x  Lumbar Exercises: Standing   Other Standing Lumbar Exercises hip hinge 5lb in bil UE; single leg dead lift one UE support    Other Standing Lumbar Exercises hip 3 ways at machine - 10x each plate 2      Lumbar Exercises: Supine   Bent Knee Raise 20 reps    Bent Knee Raise Limitations knees starting elevated    Large Ball Abdominal Isometric 5 seconds;20 reps                       PT Short Term Goals - 03/15/21 1245       PT SHORT TERM GOAL #1   Title pt will know how to safely stretch hips for improved ability to perform functional strengthening    Status Achieved      PT SHORT TERM GOAL #2   Title Pt will improve single leg standing to at least 8 seconds without UE support    Status On-going      PT SHORT TERM GOAL #3   Title ind with initial HEP    Status Achieved               PT Long Term Goals - 06/07/21 1241       PT LONG TERM GOAL #1   Title Pt will be ind with advanced HEP    Status Achieved      PT LONG TERM GOAL #2   Title Pt will report leakage reduced to  1-2x/month due to improved endurance of pelvic floor to 10 seconds    Status Achieved      PT LONG TERM GOAL #3   Title Pt will demonstrate improved LE strength and stability with SLS for at least 12 seconds without UE support    Status Achieved      PT LONG TERM GOAL #4   Title Pt will report she does not have to stop to use the restroom just in case due to improved confidence of bladder control    Status Achieved                   Plan - 06/07/21 1242     Clinical Impression Statement Pt is doing well and met all goals. She had some questions about advancing exercises and needed to check her form for dead lift.  Pt is expected to be successful with HEP and will d/c today.    PT Treatment/Interventions ADLs/Self Care Home Management;Biofeedback;Cryotherapy;Electrical Stimulation;Moist Heat;Therapeutic exercise;Therapeutic activities;Neuromuscular re-education;Patient/family education;Manual techniques;Taping;Dry needling;Passive range of motion    PT Next Visit Plan d/c    PT Home Exercise Plan urge, Access Code: [email or web address]    Consulted and Agree with Plan of Care Patient             Patient will benefit from skilled therapeutic intervention in order to improve the following deficits and impairments:  Abnormal gait, Decreased balance, Increased muscle spasms, Postural dysfunction, Impaired flexibility, Decreased strength, Decreased coordination, Decreased endurance, Decreased range of motion  Visit Diagnosis: Muscle weakness (generalized)  Unspecified lack of coordination  Stiffness of right hip, not elsewhere classified  Stiffness of left hip, not elsewhere classified     Problem List Patient Active Problem List   Diagnosis Date Noted   Postmenopausal 12/05/2020   Urge incontinence of urine 12/05/2020   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   Age-related osteoporosis without current pathological fracture 12/02/2020   Deviated septum 10/04/2016    Nasal turbinate hypertrophy 10/04/2016  Itchy eyes 09/13/2016   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 smear of cervix 01/24/2016   Postop Hyponatremia 03/20/2012   OA (osteoarthritis) of knee 03/18/2012   Sinusitis, chronic 11/25/2008   Obstructive sleep apnea 06/14/2007   HYPERTENSION 06/14/2007   Seasonal and perennial allergic rhinitis 06/14/2007   Allergic asthma, mild persistent, uncomplicated 62/85/4965   GERD 06/14/2007    Jule Ser, PT 06/07/2021, 1:10 PM  [place] @ [place], [place], [address] Phone:     Fax:     Name: [name] MRN: [id number] Date of Birth: 07-16-1951  PHYSICAL THERAPY DISCHARGE SUMMARY  Visits from Start of Care: 10  Current functional level related to goals / functional outcomes: See above goals   Remaining deficits: See above   Education / Equipment: HEP   Patient agrees to discharge. Patient goals were met. Patient is being discharged due to meeting the stated rehab goals.  Gustavus Bryant, PT 06/07/21 1:11 PM

## 2021-06-14 ENCOUNTER — Telehealth (HOSPITAL_BASED_OUTPATIENT_CLINIC_OR_DEPARTMENT_OTHER): Payer: Self-pay

## 2021-06-14 NOTE — Telephone Encounter (Signed)
Called and spoke to patient about her bone density results and recommendations. Patient expressed understanding, but did want to let us know that she is currently taking Prolia injections for her bones.   Patient also wanted to know if you have had a chance to go over her mammogram results. Please advise. tbw

## 2021-06-15 ENCOUNTER — Encounter (HOSPITAL_BASED_OUTPATIENT_CLINIC_OR_DEPARTMENT_OTHER): Payer: Self-pay

## 2021-06-16 DIAGNOSIS — G4700 Insomnia, unspecified: Secondary | ICD-10-CM | POA: Diagnosis not present

## 2021-06-16 DIAGNOSIS — M810 Age-related osteoporosis without current pathological fracture: Secondary | ICD-10-CM | POA: Diagnosis not present

## 2021-06-16 DIAGNOSIS — B009 Herpesviral infection, unspecified: Secondary | ICD-10-CM | POA: Diagnosis not present

## 2021-06-16 DIAGNOSIS — J45909 Unspecified asthma, uncomplicated: Secondary | ICD-10-CM | POA: Diagnosis not present

## 2021-06-16 DIAGNOSIS — M1990 Unspecified osteoarthritis, unspecified site: Secondary | ICD-10-CM | POA: Diagnosis not present

## 2021-06-16 DIAGNOSIS — K219 Gastro-esophageal reflux disease without esophagitis: Secondary | ICD-10-CM | POA: Diagnosis not present

## 2021-06-16 DIAGNOSIS — I10 Essential (primary) hypertension: Secondary | ICD-10-CM | POA: Diagnosis not present

## 2021-06-16 DIAGNOSIS — E785 Hyperlipidemia, unspecified: Secondary | ICD-10-CM | POA: Diagnosis not present

## 2021-06-16 DIAGNOSIS — G8929 Other chronic pain: Secondary | ICD-10-CM | POA: Diagnosis not present

## 2021-06-20 ENCOUNTER — Other Ambulatory Visit: Payer: Self-pay

## 2021-06-20 ENCOUNTER — Encounter: Payer: Medicare PPO | Attending: Family Medicine | Admitting: Dietician

## 2021-06-20 DIAGNOSIS — R7303 Prediabetes: Secondary | ICD-10-CM | POA: Diagnosis not present

## 2021-06-20 NOTE — Progress Notes (Signed)
On 06/20/2021 patient completed a post core session of the Diabetes Prevention Program course virtually with [facility]. By the end of this session patients are able to complete the following objectives:   Virtual Visit via Video Note  I connected with [name]. [name], 01/28/1951 by a video enabled application and verified that I am speaking with the correct person using two identifiers.  Location: Patient: Virtual Provider: Office  Learning Objectives: Identify different types of stressors List effects of stress on health and healthy lifestyle choices Discuss how stress affects lifestyle choices  Name healthy ways to manage and avoid stressors   Goals:  Record weight taken outside of class.  Track foods and beverages eaten each day in the "Food and Activity Tracker," including calories and fat grams for each item.   Track activity type, minutes you were active, and distance you reached each day in the "Food and Activity Tracker."   Follow-Up Plan: Attend next session.  Email completed "Food and Activity Trackers" before next session to be reviewed by Lifestyle Coach.

## 2021-06-23 DIAGNOSIS — M810 Age-related osteoporosis without current pathological fracture: Secondary | ICD-10-CM | POA: Diagnosis not present

## 2021-07-05 DIAGNOSIS — L578 Other skin changes due to chronic exposure to nonionizing radiation: Secondary | ICD-10-CM | POA: Diagnosis not present

## 2021-07-05 DIAGNOSIS — Z1389 Encounter for screening for other disorder: Secondary | ICD-10-CM | POA: Diagnosis not present

## 2021-07-05 DIAGNOSIS — L603 Nail dystrophy: Secondary | ICD-10-CM | POA: Diagnosis not present

## 2021-07-05 DIAGNOSIS — L821 Other seborrheic keratosis: Secondary | ICD-10-CM | POA: Diagnosis not present

## 2021-07-05 DIAGNOSIS — Z23 Encounter for immunization: Secondary | ICD-10-CM | POA: Diagnosis not present

## 2021-07-05 DIAGNOSIS — L814 Other melanin hyperpigmentation: Secondary | ICD-10-CM | POA: Diagnosis not present

## 2021-07-05 DIAGNOSIS — D225 Melanocytic nevi of trunk: Secondary | ICD-10-CM | POA: Diagnosis not present

## 2021-07-05 DIAGNOSIS — Z0000 Encounter for general adult medical examination without abnormal findings: Secondary | ICD-10-CM | POA: Diagnosis not present

## 2021-07-08 DIAGNOSIS — R7309 Other abnormal glucose: Secondary | ICD-10-CM | POA: Diagnosis not present

## 2021-07-08 DIAGNOSIS — E782 Mixed hyperlipidemia: Secondary | ICD-10-CM | POA: Diagnosis not present

## 2021-07-08 DIAGNOSIS — I10 Essential (primary) hypertension: Secondary | ICD-10-CM | POA: Diagnosis not present

## 2021-07-12 DIAGNOSIS — E782 Mixed hyperlipidemia: Secondary | ICD-10-CM | POA: Diagnosis not present

## 2021-07-12 DIAGNOSIS — N3941 Urge incontinence: Secondary | ICD-10-CM | POA: Diagnosis not present

## 2021-07-12 DIAGNOSIS — J309 Allergic rhinitis, unspecified: Secondary | ICD-10-CM | POA: Diagnosis not present

## 2021-07-12 DIAGNOSIS — K219 Gastro-esophageal reflux disease without esophagitis: Secondary | ICD-10-CM | POA: Diagnosis not present

## 2021-07-12 DIAGNOSIS — J45909 Unspecified asthma, uncomplicated: Secondary | ICD-10-CM | POA: Diagnosis not present

## 2021-07-12 DIAGNOSIS — R252 Cramp and spasm: Secondary | ICD-10-CM | POA: Diagnosis not present

## 2021-07-12 DIAGNOSIS — R7301 Impaired fasting glucose: Secondary | ICD-10-CM | POA: Diagnosis not present

## 2021-07-12 DIAGNOSIS — I10 Essential (primary) hypertension: Secondary | ICD-10-CM | POA: Diagnosis not present

## 2021-07-12 DIAGNOSIS — M810 Age-related osteoporosis without current pathological fracture: Secondary | ICD-10-CM | POA: Diagnosis not present

## 2021-07-25 ENCOUNTER — Other Ambulatory Visit: Payer: Self-pay

## 2021-07-25 ENCOUNTER — Encounter: Payer: Medicare PPO | Attending: Family Medicine | Admitting: Dietician

## 2021-07-25 DIAGNOSIS — R7303 Prediabetes: Secondary | ICD-10-CM | POA: Diagnosis not present

## 2021-07-25 NOTE — Progress Notes (Signed)
On 07/25/2021 patient completed a post core session of the Diabetes Prevention Program course virtually with [facility]. By the end of this session patients are able to complete the following objectives:   Virtual Visit via Video Note  I connected with [name]. [name], 03/12/1951 by a video enabled application and verified that I am speaking with the correct person using two identifiers.  Location: Patient: Virtual Provider: Office  Learning Objectives: List 9 ways to to stay on track during special events/occasions. Create a plan to avoid a food problem at an upcoming special event/occasion Describe ways to make time for healthy behaviors during special events/occasions   Goals:  Record weight taken outside of class.  Track foods and beverages eaten each day in the "Food and Activity Tracker," including calories and fat grams for each item.   Track activity type, minutes you were active, and distance you reached each day in the "Food and Activity Tracker."   Follow-Up Plan: Attend next session.  Email completed "Food and Activity Trackers" before next session to be reviewed by Lifestyle Coach.

## 2021-07-26 ENCOUNTER — Other Ambulatory Visit: Payer: Self-pay

## 2021-07-26 ENCOUNTER — Ambulatory Visit (INDEPENDENT_AMBULATORY_CARE_PROVIDER_SITE_OTHER): Payer: Medicare PPO

## 2021-07-26 ENCOUNTER — Ambulatory Visit: Payer: Medicare PPO | Admitting: Podiatry

## 2021-07-26 ENCOUNTER — Ambulatory Visit: Payer: Medicare PPO

## 2021-07-26 DIAGNOSIS — M7742 Metatarsalgia, left foot: Secondary | ICD-10-CM | POA: Diagnosis not present

## 2021-07-26 DIAGNOSIS — M778 Other enthesopathies, not elsewhere classified: Secondary | ICD-10-CM

## 2021-07-26 DIAGNOSIS — M7741 Metatarsalgia, right foot: Secondary | ICD-10-CM | POA: Diagnosis not present

## 2021-07-26 DIAGNOSIS — M2040 Other hammer toe(s) (acquired), unspecified foot: Secondary | ICD-10-CM

## 2021-07-26 NOTE — Progress Notes (Signed)
SITUATION Reason for Consult: Evaluation for Bilateral Custom Foot Orthoses Patient / Caregiver Report: Patient has 11+ year old foot orthotics and wants new ones  OBJECTIVE DATA: Patient History / Diagnosis: Capsulitis bilateral Current or Previous Devices: Diabetic style custom foot orthotics  Foot Examination: Skin presentation:   Intact Ulcers & Callousing:   None and no history Toe / Foot Deformities:  Pes cavovarus Weight Bearing Presentation:  Cavus Sensation:    Intact  ORTHOTIC RECOMMENDATION Recommended Device: 1x pair of custom [employer] FOs  GOALS OF ORTHOSES - Reduce Pain - Prevent Foot Deformity - Prevent Progression of Further Foot Deformity - Relieve Pressure - Improve the Overall Biomechanical Function of the Foot and Lower Extremity.  ACTIONS PERFORMED Patient was casted for Foot Orthoses via crush box. Procedure was explained and patient tolerated procedure well. All questions were answered and concerns addressed.  PLAN Insurance to be verified and out of pocket cost communicated to patient. Once cost verified and agreed upon, casts are to be sent to [facility] for fabrication. Patient is to be called for fitting when devices are ready.

## 2021-07-26 NOTE — Progress Notes (Signed)
Subjective:  Patient ID: [name], female    DOB: 1951-03-16,  MRN: [id number] HPI Chief Complaint  Patient presents with   Foot Orthotics    Pt is requesting an order for orthotics, mentioned she had them done years ago at different facility and now they are causing blister    Nail Problem    Concern with fungus in great toenails    New Patient (Initial Visit)    70 y.o. female presents with the above complaint.   ROS: Denies fever chills nausea vomiting muscle aches pains calf pain back pain chest pain shortness of breath.  Past Medical History:  Diagnosis Date   Acid reflux    Anemia    "during the time I was taking meloxicam"   Arthritis    Asthma    LOV  6/13  Dr Annamaria Boots EPIC/ clearance on chart  from 10/12   Depression    Dysrhythmia    PVC's with caffeine and anxiety   Heart murmur    since childhood   Hiatal hernia    High cholesterol    Hypertension    OV with clearance and note Dr Addison Lank 6/13 chart   Osteoporosis    Sleep apnea    no CPAP/ last study 9 yrs ago- "mild per patient"   STD (sexually transmitted disease)    HSV2   Stye 06/2009   eye   Past Surgical History:  Procedure Laterality Date   bilateral hip replacement Bilateral 6/07, 9/07   right, then left   BREAST REDUCTION SURGERY     CESAREAN SECTION     FRACTURE SURGERY     Left leg-1977   NASAL SEPTOPLASTY W/ TURBINOPLASTY Bilateral 07/19/2018   Procedure: NASAL SEPTOPLASTY WITH TURBINATE REDUCTION;  Surgeon: Jerrell Belfast, MD;  Location: [facility];  Service: ENT;  Laterality: Bilateral;   NASAL SEPTUM SURGERY     REDUCTION MAMMAPLASTY     TOTAL KNEE ARTHROPLASTY  03/18/2012   Procedure: TOTAL KNEE ARTHROPLASTY;  Surgeon: Gearlean Alf, MD;  Location: [facility];  Service: Orthopedics;  Laterality: Left;   TOTAL KNEE ARTHROPLASTY Right 07/23/2017   Procedure: RIGHT TOTAL KNEE ARTHROPLASTY;  Surgeon: Gaynelle Arabian, MD;  Location: [facility];  Service: Orthopedics;  Laterality: Right;     Current Outpatient Medications:    acetaminophen (TYLENOL 8 HOUR) 650 MG CR tablet, Take 1 tablet (650 mg total) by mouth every 8 (eight) hours as needed., Disp: 30 tablet, Rfl: 0   albuterol (VENTOLIN HFA) 108 (90 Base) MCG/ACT inhaler, INHALE 2 PUFFS INTO THE LUNGS EVERY 4 HOURS AS NEEDED FOR WHEEZE OR FOR SHORTNESS OF BREATH, Disp: 54 each, Rfl: 3   amLODipine (NORVASC) 5 MG tablet, Take 5 mg by mouth daily before breakfast., Disp: , Rfl:    atorvastatin (LIPITOR) 40 MG tablet, atorvastatin 40 mg tablet, Disp: , Rfl:    Calcium Carb-Cholecalciferol (CALCIUM + VITAMIN D3 PO), Take 1 tablet by mouth daily., Disp: , Rfl:    cetirizine (ZYRTEC) 10 MG tablet, Take 10 mg by mouth daily as needed for allergies. , Disp: , Rfl:    Cholecalciferol (VITAMIN D3) 5000 units CAPS, Take 5,000 Units by mouth daily., Disp: , Rfl:    cloNIDine (CATAPRES) 0.1 MG tablet, Take 0.1 mg by mouth at bedtime., Disp: , Rfl:    denosumab (PROLIA) 60 MG/ML SOSY injection, See admin instructions., Disp: , Rfl:    esomeprazole (NEXIUM) 40 MG capsule, Nexium 40 mg capsule,delayed release   40 mg by  oral route., Disp: , Rfl:    Eszopiclone 3 MG TABS, Take 3 mg by mouth at bedtime. Take immediately before bedtime, Disp: , Rfl:    fluticasone (FLONASE) 50 MCG/ACT nasal spray, Place 2 sprays into both nostrils at bedtime., Disp: 48 mL, Rfl: 3   fluticasone-salmeterol (WIXELA INHUB) 100-50 MCG/ACT AEPB, Inhale 1 puff into the lungs 2 (two) times daily., Disp: 60 each, Rfl: 12   Influenza vac split quadrivalent PF (FLUZONE HIGH-DOSE) 0.5 ML injection, Fluzone High-Dose 2019-20 (PF) 180 mcg/0.5 mL intramuscular syringe  TO BE ADMINISTERED BY PHARMACIST FOR IMMUNIZATION, Disp: , Rfl:    magnesium gluconate (MAGONATE) 500 MG tablet, Take 500 mg at bedtime by mouth. , Disp: , Rfl:    methocarbamol (ROBAXIN) 500 MG tablet, Take 1 tablet (500 mg total) every 6 (six) hours as needed by mouth for muscle spasms. (Patient taking  differently: Take 500 mg by mouth every 8 (eight) hours as needed for muscle spasms.), Disp: 80 tablet, Rfl: 0   montelukast (SINGULAIR) 10 MG tablet, TAKE 1 TABLET BY MOUTH EVERY DAY, Disp: 90 tablet, Rfl: 0   Multiple Vitamin (MULTI-VITAMINS) TABS, Take 1 tablet by mouth daily. , Disp: , Rfl:    valACYclovir (VALTREX) 500 MG tablet, TAKE 1 TABLET BY MOUTH EVERY DAY INCREASE TO TWICE A DAY FOR 3 DAYS WITH SYMPTOMS AS NEEDED, Disp: 45 tablet, Rfl: 1   Zoster Vaccine Adjuvanted (SHINGRIX) injection, Shingrix (PF) 50 mcg/0.5 mL intramuscular suspension, kit, Disp: , Rfl:   Allergies  Allergen Reactions   Demerol [Meperidine] Nausea And Vomiting   Lansoprazole     Other reaction(s): Unknown   Meloxicam Other (See Comments)    Blood count went down    Nadolol Other (See Comments)    Low blood pressure    Review of Systems Objective:  There were no vitals filed for this visit.  General: Well developed, nourished, in no acute distress, alert and oriented x3   Dermatological: Skin is warm, dry and supple bilateral. Nails x 10 are well maintained; remaining integument appears unremarkable at this time. There are no open sores, no preulcerative lesions, no rash or signs of infection present.  Nail dystrophy hallux nails bilateral not infected.  Vascular: Dorsalis Pedis artery and Posterior Tibial artery pedal pulses are 2/4 bilateral with immedate capillary fill time. Pedal hair growth present. No varicosities and no lower extremity edema present bilateral.   Neruologic: Grossly intact via light touch bilateral. Vibratory intact via tuning fork bilateral. Protective threshold with Semmes Wienstein monofilament intact to all pedal sites bilateral. Patellar and Achilles deep tendon reflexes 2+ bilateral. No Babinski or clonus noted bilateral.   Musculoskeletal: No gross boney pedal deformities bilateral. No pain, crepitus, or limitation noted with foot and ankle range of motion bilateral. Muscular  strength 5/5 in all groups tested bilateral.  Mild hammertoe deformities bilateral with some reactive hyperkeratosis.  Gait: Unassisted, Nonantalgic.    Radiographs:  Radiographs taken today demonstrate osseously mature individual with hammertoe deformities bilateral mild osteopenia no acute findings noted.  Assessment & Plan:   Assessment: Hammertoe deformity metatarsalgia and capsulitis forefoot bilateral.  Plan: Orthotics were scanned today.     Roarke Marciano T. [place], [place]

## 2021-08-22 ENCOUNTER — Telehealth: Payer: Self-pay | Admitting: Podiatry

## 2021-08-22 ENCOUNTER — Other Ambulatory Visit: Payer: Self-pay

## 2021-08-22 ENCOUNTER — Encounter: Payer: Medicare PPO | Attending: Family Medicine | Admitting: Dietician

## 2021-08-22 DIAGNOSIS — R7303 Prediabetes: Secondary | ICD-10-CM | POA: Diagnosis not present

## 2021-08-22 NOTE — Progress Notes (Signed)
On 08/22/2021 patient completed a post core session of the Diabetes Prevention Program course virtually with [facility]. By the end of this session patients are able to complete the following objectives:   Virtual Visit via Video Note  I connected with [name]. [name], 23-Jan-1951 by a video enabled application and verified that I am speaking with the correct person using two identifiers.  Location: Patient: Virtual Provider: Office  Learning Objectives: Counter self-defeating thoughts with positive self-statements Define assertiveness.  List examples of ways to practice assertiveness.   Goals:  Record weight taken outside of class.  Track foods and beverages eaten each day in the "Food and Activity Tracker," including calories and fat grams for each item.   Track activity type, minutes you were active, and distance you reached each day in the "Food and Activity Tracker."   Follow-Up Plan: Attend next session.  Email completed "Food and Activity Trackers" before next session to be reviewed by Lifestyle Coach.

## 2021-08-22 NOTE — Telephone Encounter (Signed)
Orthotics in.. lvm for pt to call to schedule an appt to pick them up. °

## 2021-08-23 ENCOUNTER — Other Ambulatory Visit: Payer: Self-pay

## 2021-08-23 ENCOUNTER — Ambulatory Visit: Payer: Medicare PPO

## 2021-08-23 DIAGNOSIS — M778 Other enthesopathies, not elsewhere classified: Secondary | ICD-10-CM

## 2021-08-23 NOTE — Progress Notes (Signed)
SITUATION: Reason for Visit: Fitting and Delivery of Custom Fabricated Foot Orthoses Patient Report: Patient reports comfort and is satisfied with device.  OBJECTIVE DATA: Patient History / Diagnosis:  No change in pathology Provided Device:  Functional foot orthotics  GOAL OF ORTHOSIS - Improve gait - Decrease energy expenditure - Improve Balance - Provide Triplanar stability of foot complex - Facilitate motion  ACTIONS PERFORMED Patient was fit with foot orthotics trimmed to shoe last. Patient tolerated fittign procedure. Device was modified as follows to better fit patient: - Toe plate was trimmed to shoe last  Patient was provided with verbal and written instruction and demonstration regarding donning, doffing, wear, care, proper fit, function, purpose, cleaning, and use of the orthosis and in all related precautions and risks and benefits regarding the orthosis.  Patient was also provided with verbal instruction regarding how to report any failures or malfunctions of the orthosis and necessary follow up care. Patient was also instructed to contact our office regarding any change in status that may affect the function of the orthosis.  Patient demonstrated independence with proper donning, doffing, and fit and verbalized understanding of all instructions.  Patient also requested her previous pair be refurbished  PLAN: Patient is to return for refurbished orthotics in two weeks. All questions were answered and concerns addressed. Plan of care was discussed with and agreed upon by the patient.

## 2021-09-06 ENCOUNTER — Other Ambulatory Visit: Payer: Self-pay

## 2021-09-09 ENCOUNTER — Telehealth: Payer: Self-pay | Admitting: Podiatry

## 2021-09-09 NOTE — Telephone Encounter (Signed)
Refurbished orthotics in.. lvm for pt ok to pick up and that there is a 90.00 balance for them when she picks them up.

## 2021-09-12 DIAGNOSIS — M810 Age-related osteoporosis without current pathological fracture: Secondary | ICD-10-CM | POA: Diagnosis not present

## 2021-09-12 DIAGNOSIS — E559 Vitamin D deficiency, unspecified: Secondary | ICD-10-CM | POA: Diagnosis not present

## 2021-09-12 DIAGNOSIS — I10 Essential (primary) hypertension: Secondary | ICD-10-CM | POA: Diagnosis not present

## 2021-09-12 DIAGNOSIS — R634 Abnormal weight loss: Secondary | ICD-10-CM | POA: Diagnosis not present

## 2021-09-13 ENCOUNTER — Other Ambulatory Visit: Payer: Self-pay | Admitting: Internal Medicine

## 2021-09-19 ENCOUNTER — Encounter: Payer: Medicare PPO | Attending: Family Medicine | Admitting: Dietician

## 2021-09-19 ENCOUNTER — Other Ambulatory Visit: Payer: Self-pay

## 2021-09-19 DIAGNOSIS — R7303 Prediabetes: Secondary | ICD-10-CM | POA: Diagnosis not present

## 2021-09-19 NOTE — Progress Notes (Signed)
On 09/19/2021 pt completed a post core session of the Diabetes Prevention Program course virtually with [facility]. By the end of this session patients are able to complete the following objectives:   Virtual Visit via Video Note  I connected with Tiburcio Bash. [place] by a video enabled application and verified that I am speaking with the correct person using two identifiers.  Location: Patient: Virtual Provider: Office  Learning Objectives: Reflect on lifestyle changes they have made since starting the DPP.  Set long-term goals to promote continued maintenance of lifestyle changes made during the program.   Goals:  Work toward reaching new long-term goals set during class.   Follow-Up Plan: Contact Lifestyle Coach with questions/concerns PRN.

## 2021-12-10 ENCOUNTER — Other Ambulatory Visit: Payer: Self-pay | Admitting: Internal Medicine

## 2021-12-27 DIAGNOSIS — M810 Age-related osteoporosis without current pathological fracture: Secondary | ICD-10-CM | POA: Diagnosis not present

## 2021-12-28 DIAGNOSIS — M25552 Pain in left hip: Secondary | ICD-10-CM | POA: Diagnosis not present

## 2021-12-28 DIAGNOSIS — M25512 Pain in left shoulder: Secondary | ICD-10-CM | POA: Diagnosis not present

## 2021-12-28 DIAGNOSIS — M25551 Pain in right hip: Secondary | ICD-10-CM | POA: Diagnosis not present

## 2021-12-30 DIAGNOSIS — H5212 Myopia, left eye: Secondary | ICD-10-CM | POA: Diagnosis not present

## 2022-01-02 ENCOUNTER — Ambulatory Visit: Payer: Medicare PPO | Admitting: Nurse Practitioner

## 2022-01-02 ENCOUNTER — Encounter: Payer: Self-pay | Admitting: Nurse Practitioner

## 2022-01-02 VITALS — BP 118/68 | HR 60 | Temp 98.0°F | Ht 64.0 in | Wt 172.0 lb

## 2022-01-02 DIAGNOSIS — J3089 Other allergic rhinitis: Secondary | ICD-10-CM | POA: Diagnosis not present

## 2022-01-02 DIAGNOSIS — J302 Other seasonal allergic rhinitis: Secondary | ICD-10-CM | POA: Diagnosis not present

## 2022-01-02 DIAGNOSIS — J4530 Mild persistent asthma, uncomplicated: Secondary | ICD-10-CM | POA: Diagnosis not present

## 2022-01-02 LAB — CBC WITH DIFFERENTIAL/PLATELET
Basophils Absolute: 0 10*3/uL (ref 0.0–0.1)
Basophils Relative: 0.9 % (ref 0.0–3.0)
Eosinophils Absolute: 0.2 10*3/uL (ref 0.0–0.7)
Eosinophils Relative: 3.3 % (ref 0.0–5.0)
HCT: 42.3 % (ref 36.0–46.0)
Hemoglobin: 13.6 g/dL (ref 12.0–15.0)
Lymphocytes Relative: 29.4 % (ref 12.0–46.0)
Lymphs Abs: 1.3 10*3/uL (ref 0.7–4.0)
MCHC: 32.2 g/dL (ref 30.0–36.0)
MCV: 86 fl (ref 78.0–100.0)
Monocytes Absolute: 0.6 10*3/uL (ref 0.1–1.0)
Monocytes Relative: 12.4 % — ABNORMAL HIGH (ref 3.0–12.0)
Neutro Abs: 2.4 10*3/uL (ref 1.4–7.7)
Neutrophils Relative %: 54 % (ref 43.0–77.0)
Platelets: 222 10*3/uL (ref 150.0–400.0)
RBC: 4.92 Mil/uL (ref 3.87–5.11)
RDW: 13.7 % (ref 11.5–15.5)
WBC: 4.5 10*3/uL (ref 4.0–10.5)

## 2022-01-02 LAB — POCT EXHALED NITRIC OXIDE: FeNO level (ppb): 10

## 2022-01-02 MED ORDER — PREDNISONE 20 MG PO TABS
40.0000 mg | ORAL_TABLET | Freq: Every day | ORAL | 0 refills | Status: AC
Start: 2022-01-02 — End: 2022-01-07

## 2022-01-02 NOTE — Progress Notes (Signed)
? ?'@Patient'  ID: [name], female    DOB: 1951/01/29, 71 y.o.   MRN: [id number] ? ?Chief Complaint  ?Patient presents with  ? Follow-up  ?  Pt is here for follow up with her asthma. Pt states its getting worse since the weather is changing. Pt states she is currently on Wixlea but wants to possibly switch back to Trelegy.   ? ? ?Referring provider: ?Cari Caraway, MD ? ?HPI: ?71 year old female, never smoker followed for mild persistent allergic asthma, allergic rhinitis and OSA failed CPAP/oral appliance.  She is a patient of Dr. Janee Morn and last seen in office on 04/01/2021. Past medical history significant for hypertension, GERD, OA. ? ?TEST/EVENTS:  ?06/22/2020: Eosinophils 100 ? ?04/01/2021: OV with Dr. Annamaria Boots.  Previously tried Trelegy but felt like her breathing was unchanged.  Resumed Wixela afterwards.  Cannot tell if Singulair helps.  Advised to continue [place] notify if breathing worsens off Singulair.  Has had recent intentional weight loss; suspected that this helped improve her OSA as she is not having any symptoms.  Had not tolerated CPAP or oral appliance in past. ? ?01/02/2022: Today-acute visit ?Patient presents today after increase shortness of breath and wheezing over the weekend.  She reports that along with her allergy symptoms, she also started having worsening DOE.  Noticed wheezing, especially at night.  Today, she reports feeling significantly better.  She wanted to come in for her appointment anyways and discuss possibly stepping back up to Trelegy.  In the past she was unsure if it actually helped her breathing.  Denies any recent flares requiring prednisone.  Breathing has overall been stable aside from a few days over the weekend.  She rarely ever uses her albuterol.  She did just recently resumed taking her Singulair. ? ?Allergies  ?Allergen Reactions  ? Demerol [Meperidine] Nausea And Vomiting  ? Lansoprazole   ?  Other reaction(s): Unknown  ? Meloxicam Other (See Comments)  ?   Blood count went down ?  ? Nadolol Other (See Comments)  ?  Low blood pressure ?  ? ? ?Immunization History  ?Administered Date(s) Administered  ? Fluad Quad(high Dose 65+) 06/10/2019  ? Influenza Split 06/05/2011, 06/28/2012, 10/07/2013, 06/04/2016, 05/25/2017  ? Influenza Whole 05/30/2010  ? Influenza, High Dose Seasonal PF 05/11/2018, 06/08/2021  ? Influenza,inj,Quad PF,6+ Mos 07/08/2014, 08/11/2015  ? Influenza-Unspecified 07/09/2020  ? PFIZER Comirnaty(Gray Top)Covid-19 Tri-Sucrose Vaccine 12/05/2020  ? PFIZER(Purple Top)SARS-COV-2 Vaccination 09/27/2019, 10/20/2019, 05/21/2020  ? PNEUMOCOCCAL CONJUGATE-20 06/08/2021  ? Pension scheme manager 17yr & up 05/12/2021  ? Pneumococcal Polysaccharide-23 09/09/2009  ? Td 11/29/2017  ? Zoster Recombinat (Shingrix) 10/06/2017  ? Zoster, Live 05/05/2017  ? ? ?Past Medical History:  ?Diagnosis Date  ? Acid reflux   ? Anemia   ? "during the time I was taking meloxicam"  ? Arthritis   ? Asthma   ? LOV  6/13  Dr YAnnamaria BootsEPIC/ clearance on chart  from 10/12  ? Depression   ? Dysrhythmia   ? PVC's with caffeine and anxiety  ? Heart murmur   ? since childhood  ? Hiatal hernia   ? High cholesterol   ? Hypertension   ? OV with clearance and note Dr MAddison Lank6/13 chart  ? Osteoporosis   ? Sleep apnea   ? no CPAP/ last study 9 yrs ago- "mild per patient"  ? STD (sexually transmitted disease)   ? HSV2  ? Stye 06/2009  ? eye  ? ? ?Tobacco History: ?Social History  ? ?  Tobacco Use  ?Smoking Status Never  ?Smokeless Tobacco Never  ? ?Counseling given: Not Answered ? ? ?Outpatient Medications Prior to Visit  ?Medication Sig Dispense Refill  ? albuterol (VENTOLIN HFA) 108 (90 Base) MCG/ACT inhaler INHALE 2 PUFFS INTO THE LUNGS EVERY 4 HOURS AS NEEDED FOR WHEEZE OR FOR SHORTNESS OF BREATH 54 each 3  ? amLODipine (NORVASC) 5 MG tablet Take 5 mg by mouth daily before breakfast.    ? atorvastatin (LIPITOR) 40 MG tablet atorvastatin 40 mg tablet    ? Calcium Carb-Cholecalciferol  (CALCIUM + VITAMIN D3 PO) Take 1 tablet by mouth daily.    ? cetirizine (ZYRTEC) 10 MG tablet Take 10 mg by mouth daily as needed for allergies.     ? Cholecalciferol (VITAMIN D3) 5000 units CAPS Take 5,000 Units by mouth daily.    ? cloNIDine (CATAPRES) 0.1 MG tablet Take 0.1 mg by mouth at bedtime.    ? denosumab (PROLIA) 60 MG/ML SOSY injection See admin instructions.    ? esomeprazole (NEXIUM) 40 MG capsule Nexium 40 mg capsule,delayed release ?  40 mg by oral route.    ? Eszopiclone 3 MG TABS Take 3 mg by mouth at bedtime. Take immediately before bedtime    ? fluticasone (FLONASE) 50 MCG/ACT nasal spray Place 2 sprays into both nostrils at bedtime. 48 mL 3  ? fluticasone-salmeterol (WIXELA INHUB) 100-50 MCG/ACT AEPB Inhale 1 puff into the lungs 2 (two) times daily. 60 each 12  ? Influenza vac split quadrivalent PF (FLUZONE HIGH-DOSE) 0.5 ML injection Fluzone High-Dose 2019-20 (PF) 180 mcg/0.5 mL intramuscular syringe ? TO BE ADMINISTERED BY PHARMACIST FOR IMMUNIZATION    ? magnesium gluconate (MAGONATE) 500 MG tablet Take 500 mg at bedtime by mouth.     ? methocarbamol (ROBAXIN) 500 MG tablet Take 1 tablet (500 mg total) every 6 (six) hours as needed by mouth for muscle spasms. (Patient taking differently: Take 500 mg by mouth every 8 (eight) hours as needed for muscle spasms.) 80 tablet 0  ? montelukast (SINGULAIR) 10 MG tablet TAKE 1 TABLET BY MOUTH EVERY DAY 90 tablet 3  ? Multiple Vitamin (MULTI-VITAMINS) TABS Take 1 tablet by mouth daily.     ? valACYclovir (VALTREX) 500 MG tablet TAKE 1 TABLET BY MOUTH EVERY DAY INCREASE TO TWICE A DAY FOR 3 DAYS WITH SYMPTOMS AS NEEDED 45 tablet 1  ? Zoster Vaccine Adjuvanted [facility]) injection Shingrix (PF) 50 mcg/0.5 mL intramuscular suspension, kit    ? acetaminophen (TYLENOL 8 HOUR) 650 MG CR tablet Take 1 tablet (650 mg total) by mouth every 8 (eight) hours as needed. 30 tablet 0  ? ?No facility-administered medications prior to visit.  ? ? ? ?Review of Systems:   ? ?Constitutional: No weight loss or gain, night sweats, fevers, chills, fatigue, or lassitude. ?HEENT: No headaches, difficulty swallowing, tooth/dental problems, or sore throat. No itching, ear ache + sneezing, nasal congestion (improving) ?CV:  No chest pain, orthopnea, PND, swelling in lower extremities, anasarca, dizziness, palpitations, syncope ?Resp: +shortness of breath with exertion (mostly resolved); nocturnal wheezing (improving). no excess mucus or change in color of mucus. No productive or non-productive. No hemoptysis. No wheezing.  No chest wall deformity ?GI:  No heartburn, indigestion, abdominal pain, nausea, vomiting, diarrhea, change in bowel habits, loss of appetite, bloody stools.  ?Skin: No rash, lesions, ulcerations ?MSK:  No joint pain or swelling.  No decreased range of motion.  No back pain. ?Neuro: No dizziness or lightheadedness.  ?Psych: No depression or  anxiety. Mood stable.  ? ? ? ?Physical Exam: ? ?BP 118/68 (BP Location: Left Arm, Patient Position: Sitting, Cuff Size: Normal)   Pulse 60   Temp 98 ?F (36.7 ?C) (Oral)   Ht '5\' 4"'  (1.626 m)   Wt 172 lb (78 kg)   LMP 09/05/2003   SpO2 96%   BMI 29.52 kg/m?  ? ?GEN: Pleasant, interactive, well-appearing; in no acute distress. ?HEENT:  Normocephalic and atraumatic. PERRLA. Sclera white. Nasal turbinates pink, moist and patent bilaterally. No rhinorrhea present. Oropharynx pink and moist, without exudate or edema. No lesions, ulcerations, or postnasal drip.  ?NECK:  Supple w/ fair ROM. No JVD present. Normal carotid impulses w/o bruits. Thyroid symmetrical with no goiter or nodules palpated. No lymphadenopathy.   ?CV: RRR, no m/r/g, no peripheral edema. Pulses intact, +2 bilaterally. No cyanosis, pallor or clubbing. ?PULMONARY:  Unlabored, regular breathing. Clear bilaterally A&P w/o wheezes/rales/rhonchi. No accessory muscle use. No dullness to percussion. ?GI: BS present and normoactive. Soft, non-tender to palpation. No  organomegaly or masses detected. No CVA tenderness. ?MSK: No erythema, warmth or tenderness. Cap refil <2 sec all extrem. No deformities or joint swelling noted.  ?Neuro: A/Ox3. No focal deficits noted.   ?Skin: Warm, no

## 2022-01-02 NOTE — Patient Instructions (Addendum)
-  Continue Wixela 2 puffs Twice daily. Brush tongue and rinse mouth. If your breathing or wheezing worsens again, you can trial the sample of Trelegy to see if it improves your symptoms. Please notify if you decide to do this ?-Continue Albuterol inhaler 2 puffs or 3 mL neb every 6 hours as needed for shortness of breath or wheezing. Notify if symptoms persist despite rescue inhaler/neb use. ?-Continue Zyrtec 10 mg daily ?-Restart singulair 10 mg At bedtime ?-Continue flonase nasal spray 2 sprays each nostril daily ?-Continue Nexium 40 mg daily ? ?Sent a prednisone burst for you to have on hand if your symptoms do not continue to improve or wheezing returns. 40 mg (2 tabs) daily for 5 days. Take in AM with food. Please notify if you feel like you need to start taking this.  ? ?Follow up in 3 months with [name] or [name]. If symptoms do not improve or worsen, please contact office for sooner follow up or seek emergency care. ?

## 2022-01-02 NOTE — Assessment & Plan Note (Addendum)
Possible mild exacerbation over the weekend; appears to have resolved. FeNO nl. We will hold off on any steroid therapy at this time or step up in therapy.  Did send a prednisone burst to the pharmacy for her to take if her breathing worsens again. Also sent with a sample of Trelegy that she can try if symptoms flare again.  Advised her if she does try Trelegy, to stop Wixela and notify us if she has improvement in her breathing so we can send a prescription. Restart singulair during allergy season. Check eos on CBC with diff and allergen panel today. ? ?Patient Instructions  ?-Continue Wixela 2 puffs Twice daily. Brush tongue and rinse mouth. If your breathing or wheezing worsens again, you can trial the sample of Trelegy to see if it improves your symptoms. Please notify if you decide to do this ?-Continue Albuterol inhaler 2 puffs or 3 mL neb every 6 hours as needed for shortness of breath or wheezing. Notify if symptoms persist despite rescue inhaler/neb use. ?-Continue Zyrtec 10 mg daily ?-Restart singulair 10 mg At bedtime ?-Continue flonase nasal spray 2 sprays each nostril daily ?-Continue Nexium 40 mg daily ? ?Sent a prednisone burst for you to have on hand if your symptoms do not continue to improve or wheezing returns. 40 mg (2 tabs) daily for 5 days. Take in AM with food. Please notify if you feel like you need to start taking this.  ? ?Follow up in 3 months with Dr. Annamaria Boots or Alanson Aly. If symptoms do not improve or worsen, please contact office for sooner follow up or seek emergency care. ? ? ?

## 2022-01-02 NOTE — Assessment & Plan Note (Signed)
Recently restarted Singulair. Continue daily antihistamine and flonase nasal spray.  ?

## 2022-01-05 LAB — ALLERGEN PANEL (27) + IGE
Alternaria Alternata IgE: <0.1 kU/L
Aspergillus Fumigatus IgE: <0.1 kU/L
Bahia Grass IgE: <0.1 kU/L
Bermuda Grass IgE: <0.1 kU/L
Cat Dander IgE: 0.12 kU/L — AB
Cedar, Mountain IgE: <0.1 kU/L
Cladosporium Herbarum IgE: <0.1 kU/L
Cocklebur IgE: <0.1 kU/L
Cockroach, American IgE: <0.1 kU/L
Common Silver Birch IgE: <0.1 kU/L
D Farinae IgE: 6.83 kU/L — AB
D Pteronyssinus IgE: 5.11 kU/L — AB
Dog Dander IgE: 1.55 kU/L — AB
Elm, American IgE: <0.1 kU/L
Hickory, White IgE: <0.1 kU/L
IgE (Immunoglobulin E), Serum: 69 IU/mL (ref 6–495)
Johnson Grass IgE: <0.1 kU/L
Kentucky Bluegrass IgE: <0.1 kU/L
Maple/Box Elder IgE: <0.1 kU/L
Mucor Racemosus IgE: <0.1 kU/L
Oak, White IgE: <0.1 kU/L
Penicillium Chrysogen IgE: <0.1 kU/L
Pigweed, Rough IgE: <0.1 kU/L
Plantain, English IgE: <0.1 kU/L
Ragweed, Short IgE: <0.1 kU/L
Setomelanomma Rostrat: <0.1 kU/L
Timothy Grass IgE: <0.1 kU/L
White Mulberry IgE: <0.1 kU/L

## 2022-01-06 NOTE — Addendum Note (Signed)
Addended by: Clayton Bibles on: 01/06/2022 04:35 PM ? ? Modules accepted: Orders ? ?

## 2022-01-06 NOTE — Progress Notes (Signed)
RAST highly positive to dust mites and dog dander. Also has a mild positive to cat dander. Going to send referral to allergist. Advise her to continue singulair and zyrtec. Thanks.

## 2022-01-16 DIAGNOSIS — R7301 Impaired fasting glucose: Secondary | ICD-10-CM | POA: Diagnosis not present

## 2022-01-23 DIAGNOSIS — Z6831 Body mass index (BMI) 31.0-31.9, adult: Secondary | ICD-10-CM | POA: Diagnosis not present

## 2022-01-23 DIAGNOSIS — G479 Sleep disorder, unspecified: Secondary | ICD-10-CM | POA: Diagnosis not present

## 2022-01-23 DIAGNOSIS — I10 Essential (primary) hypertension: Secondary | ICD-10-CM | POA: Diagnosis not present

## 2022-01-23 DIAGNOSIS — J45909 Unspecified asthma, uncomplicated: Secondary | ICD-10-CM | POA: Diagnosis not present

## 2022-01-23 DIAGNOSIS — E669 Obesity, unspecified: Secondary | ICD-10-CM | POA: Diagnosis not present

## 2022-01-23 DIAGNOSIS — R7301 Impaired fasting glucose: Secondary | ICD-10-CM | POA: Diagnosis not present

## 2022-01-23 DIAGNOSIS — M810 Age-related osteoporosis without current pathological fracture: Secondary | ICD-10-CM | POA: Diagnosis not present

## 2022-01-23 DIAGNOSIS — J309 Allergic rhinitis, unspecified: Secondary | ICD-10-CM | POA: Diagnosis not present

## 2022-01-23 DIAGNOSIS — E782 Mixed hyperlipidemia: Secondary | ICD-10-CM | POA: Diagnosis not present

## 2022-02-02 ENCOUNTER — Ambulatory Visit (INDEPENDENT_AMBULATORY_CARE_PROVIDER_SITE_OTHER): Payer: Medicare PPO | Admitting: Obstetrics & Gynecology

## 2022-02-02 ENCOUNTER — Encounter (HOSPITAL_BASED_OUTPATIENT_CLINIC_OR_DEPARTMENT_OTHER): Payer: Self-pay | Admitting: Obstetrics & Gynecology

## 2022-02-02 VITALS — BP 143/79 | HR 73 | Ht 64.0 in | Wt 183.2 lb

## 2022-02-02 DIAGNOSIS — B009 Herpesviral infection, unspecified: Secondary | ICD-10-CM

## 2022-02-02 DIAGNOSIS — Z780 Asymptomatic menopausal state: Secondary | ICD-10-CM | POA: Diagnosis not present

## 2022-02-02 DIAGNOSIS — F419 Anxiety disorder, unspecified: Secondary | ICD-10-CM

## 2022-02-02 DIAGNOSIS — M810 Age-related osteoporosis without current pathological fracture: Secondary | ICD-10-CM | POA: Diagnosis not present

## 2022-02-02 DIAGNOSIS — N3941 Urge incontinence: Secondary | ICD-10-CM | POA: Diagnosis not present

## 2022-02-02 DIAGNOSIS — Z9189 Other specified personal risk factors, not elsewhere classified: Secondary | ICD-10-CM

## 2022-02-02 MED ORDER — VALACYCLOVIR HCL 500 MG PO TABS: 500.0000 mg | ORAL_TABLET | Freq: Every day | ORAL | 3 refills | Status: DC

## 2022-02-02 NOTE — Progress Notes (Signed)
71 y.o. G1P1 Married White or Caucasian female here for breast and pelvic exam.  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   Refill needed.  Having hip revision in October.  Is exercising but concerned with having subluxation.  This is reasoning for hip revision.  Has experienced the subluxation twice over the past several months.  She is so worried that something is going to happen.  This is preventing her from doing normal activities.  She feels she would benefit from talking to someone.    H/o osteoporosis.  On Prolia.  Managed by Dr. BChalmers Cater    Denies vaginal bleeding.  Patient's last menstrual period was 09/05/2003.          Sexually active: No.  H/O STD:  no  Health Maintenance: PCP:  Dr. MAddison Lank  Last wellness appt was in May.  Did blood work at that appt:  yes Vaccines are up to date:  yes Colonoscopy:  03/26/2019, follow up 3 years recommended.   MMG:  06/02/2021 Negative BMD:  06/02/2021 Osteoporosis in arms Last pap smear:  09/18/2019 Negative with neg HR HPV H/o abnormal pap smear:  LGSIL in 2015    reports that she has never smoked. She has never used smokeless tobacco. She reports current alcohol use. She reports that she does not use drugs.  Past Medical History:  Diagnosis Date   Acid reflux    Anemia    "during the time I was taking meloxicam"   Arthritis    Asthma    LOV  6/13  Dr YAnnamaria BootsEPIC/ clearance on chart  from 10/12   Depression    Dysrhythmia    PVC's with caffeine and anxiety   Heart murmur    since childhood   Hiatal hernia    High cholesterol    Hypertension    OV with clearance and note Dr MAddison Lank6/13 chart   Osteoporosis    Sleep apnea    no CPAP/ last study 9 yrs ago- "mild per patient"   STD (sexually transmitted disease)    HSV2   Stye 06/2009   eye    Past Surgical History:  Procedure Laterality Date   bilateral hip replacement Bilateral 6/07, 9/07   right, then left   BREAST REDUCTION SURGERY     CESAREAN SECTION     FRACTURE  SURGERY     Left leg-1977   NASAL SEPTOPLASTY W/ TURBINOPLASTY Bilateral 07/19/2018   Procedure: NASAL SEPTOPLASTY WITH TURBINATE REDUCTION;  Surgeon: SJerrell Belfast MD;  Location: [facility]  Service: ENT;  Laterality: Bilateral;   NASAL SEPTUM SURGERY     REDUCTION MAMMAPLASTY     TOTAL KNEE ARTHROPLASTY  03/18/2012   Procedure: TOTAL KNEE ARTHROPLASTY;  Surgeon: FGearlean Alf MD;  Location: [facility];  Service: Orthopedics;  Laterality: Left;   TOTAL KNEE ARTHROPLASTY Right 07/23/2017   Procedure: RIGHT TOTAL KNEE ARTHROPLASTY;  Surgeon: AGaynelle Arabian MD;  Location: [facility];  Service: Orthopedics;  Laterality: Right;    Current Outpatient Medications  Medication Sig Dispense Refill   albuterol (VENTOLIN HFA) 108 (90 Base) MCG/ACT inhaler INHALE 2 PUFFS INTO THE LUNGS EVERY 4 HOURS AS NEEDED FOR WHEEZE OR FOR SHORTNESS OF BREATH 54 each 3   amLODipine (NORVASC) 5 MG tablet Take 5 mg by mouth daily before breakfast.     atorvastatin (LIPITOR) 40 MG tablet atorvastatin 40 mg tablet     Calcium Carb-Cholecalciferol (CALCIUM + VITAMIN D3 PO) Take 1 tablet by mouth daily.  cetirizine (ZYRTEC) 10 MG tablet Take 10 mg by mouth daily as needed for allergies.      Cholecalciferol (VITAMIN D3) 5000 units CAPS Take 5,000 Units by mouth daily.     cloNIDine (CATAPRES) 0.1 MG tablet Take 0.1 mg by mouth at bedtime.     denosumab (PROLIA) 60 MG/ML SOSY injection See admin instructions.     esomeprazole (NEXIUM) 40 MG capsule Nexium 40 mg capsule,delayed release   40 mg by oral route.     Eszopiclone 3 MG TABS Take 3 mg by mouth at bedtime. Take immediately before bedtime     fluticasone (FLONASE) 50 MCG/ACT nasal spray Place 2 sprays into both nostrils at bedtime. 48 mL 3   fluticasone-salmeterol (WIXELA INHUB) 100-50 MCG/ACT AEPB Inhale 1 puff into the lungs 2 (two) times daily. 60 each 12   Influenza vac split quadrivalent PF (FLUZONE HIGH-DOSE) 0.5 ML injection Fluzone High-Dose 2019-20 (PF) 180  mcg/0.5 mL intramuscular syringe  TO BE ADMINISTERED BY PHARMACIST FOR IMMUNIZATION     magnesium gluconate (MAGONATE) 500 MG tablet Take 500 mg at bedtime by mouth.      methocarbamol (ROBAXIN) 500 MG tablet Take 1 tablet (500 mg total) every 6 (six) hours as needed by mouth for muscle spasms. (Patient taking differently: Take 500 mg by mouth every 8 (eight) hours as needed for muscle spasms.) 80 tablet 0   montelukast (SINGULAIR) 10 MG tablet TAKE 1 TABLET BY MOUTH EVERY DAY 90 tablet 3   Multiple Vitamin (MULTI-VITAMINS) TABS Take 1 tablet by mouth daily.      valACYclovir (VALTREX) 500 MG tablet TAKE 1 TABLET BY MOUTH EVERY DAY INCREASE TO TWICE A DAY FOR 3 DAYS WITH SYMPTOMS AS NEEDED 45 tablet 1   Zoster Vaccine Adjuvanted (SHINGRIX) injection Shingrix (PF) 50 mcg/0.5 mL intramuscular suspension, kit     No current facility-administered medications for this visit.    Family History  Problem Relation Age of Onset   Heart disease Father    Cancer Father    Hypertension Father    Kidney Stones Father    Arthritis Mother    Hypertension Mother    Pancreatic cancer Other    Coronary artery disease Other    Cancer Other    Asthma Other    Nephritis Daughter        glomerular    Review of Systems  Constitutional: Negative.   Genitourinary: Negative.   All other systems reviewed and are negative.  Exam:   BP (!) 143/79 (BP Location: Right Arm, Patient Position: Sitting, Cuff Size: Normal)   Pulse 73   Ht '5\' 4"'  (1.626 m) Comment: reported  Wt 183 lb 3.2 oz (83.1 kg)   LMP 09/05/2003   BMI 31.45 kg/m   Height: '5\' 4"'  (162.6 cm) (reported)  General appearance: alert, cooperative and appears stated age Breasts: normal appearance, no masses or tenderness Abdomen: soft, non-tender; bowel sounds normal; no masses,  no organomegaly Lymph nodes: Cervical, supraclavicular, and axillary nodes normal.  No abnormal inguinal nodes palpated Neurologic: Grossly normal  Pelvic: External  genitalia:  no lesions              Urethra:  normal appearing urethra with no masses, tenderness or lesions              Bartholins and Skenes: normal                 Vagina: normal appearing vagina with atrophic changes and no discharge, no lesions  Cervix: no lesions              Pap taken: No. Bimanual Exam:  Uterus:  normal size, contour, position, consistency, mobility, non-tender              Adnexa: normal adnexa and no mass, fullness, tenderness               Rectovaginal: Confirms               Anus:  normal sphincter tone, no lesions  Chaperone, Octaviano Batty, CMA, was present for exam.  Assessment/Plan: 1. GYN exam for high-risk Medicare patient - pap neg with neg HR HPV 2022 - MMG 05/2021 - BMD 05/2021 - Lab work done with Dr. Addison Lank - vaccines reviewed/updated  2. Postmenopausal - not on HRT  3. Urge incontinence of urine - did pelvic PT last year  4. HSV-2 (herpes simplex virus 2) infection - rx for valtrex sent to pharmacy  5. Age-related osteoporosis without current pathological fracture -on prolia with Dr. Chalmers Cater  6.  Anxiety - referral to [place]

## 2022-02-09 NOTE — BH Specialist Note (Deleted)
Integrated Behavioral Health via Telemedicine Visit  02/09/2022 YANIYAH KOORS [id number]  Number of [place] Clinician visits: No data recorded Session Start time: No data recorded  Session End time: No data recorded Total time in minutes: No data recorded  Referring Provider: *** Patient/Family location: *** [facility] Provider location: *** All persons participating in visit: *** Types of Service: {CHL AMB TYPE OF SERVICE:[phone]}  I connected with Elie Confer and/or Tiburcio Bash Ankney's {family members:20773} via  Telephone or Video Enabled Telemedicine Application  (Video is Caregility application) and verified that I am speaking with the correct person using two identifiers. Discussed confidentiality: {YES/NO:21197}  I discussed the limitations of telemedicine and the availability of in person appointments.  Discussed there is a possibility of technology failure and discussed alternative modes of communication if that failure occurs.  I discussed that engaging in this telemedicine visit, they consent to the provision of behavioral healthcare and the services will be billed under their insurance.  Patient and/or legal guardian expressed understanding and consented to Telemedicine visit: {YES/NO:21197}  Presenting Concerns: Patient and/or family reports the following symptoms/concerns: *** Duration of problem: ***; Severity of problem: {Mild/Moderate/Severe:20260}  Patient and/or Family's Strengths/Protective Factors: {CHL AMB BH PROTECTIVE FACTORS:[phone]}  Goals Addressed: Patient will:  Reduce symptoms of: {IBH Symptoms:21014056}   Increase knowledge and/or ability of: {IBH Patient Tools:21014057}   Demonstrate ability to: {IBH Goals:21014053}  Progress towards Goals: {CHL AMB BH PROGRESS TOWARDS GOALS:[phone]}  Interventions: Interventions utilized:  {IBH Interventions:21014054} Standardized Assessments completed: {IBH Screening  Tools:21014051}  Patient and/or Family Response: ***  Assessment: Patient currently experiencing ***.   Patient may benefit from ***.  Plan: Follow up with behavioral health clinician on : *** Behavioral recommendations: *** Referral(s): {IBH Referrals:21014055}  I discussed the assessment and treatment plan with the patient and/or parent/guardian. They were provided an opportunity to ask questions and all were answered. They agreed with the plan and demonstrated an understanding of the instructions.   They were advised to call back or seek an in-person evaluation if the symptoms worsen or if the condition fails to improve as anticipated.  Caroleen Hamman Laxmi Choung, LCSW

## 2022-02-10 DIAGNOSIS — M25551 Pain in right hip: Secondary | ICD-10-CM | POA: Insufficient documentation

## 2022-02-11 NOTE — Progress Notes (Signed)
Patient ID: [name], female    DOB: 06-14-1951, 71 y.o.   MRN: [id number]  HPI  F never smoker folowed for asthma, allergic rhinitis, complicated by hx OSA/ oral appliance, HBP, GERD PFT 05/30/10- FEV1 2.56/ 91%, FEV1/FVC 0.73, DLCO 84%  within normal limits. NPSG-04/21/14- Moderate obstructive sleep apnea, AHI 21/ hr, CPAP to 14, weight 185 lbs  -----------------------------------------------------------------   04/01/21- 71 year old female never smoker followed for Allergic Asthma, Allergic Rhinitis, OSA-failedCPAP/ oral appliance, complicated by HBP, GERD -Singulair, Wixela100-50, albuterol HFA, Lunesta Body weight today 168 lbs Has lost weight with a diet/ exercise program through [facility]. No OSA concern now. Dr Toy Care helped her with insomnia giving Lunesta and clonidine for this. Asthma doing well with maintenance Wixela. Trelegy was no better. Rarely uses rescue inhaler. Can't tell if Singulair helps. So we discussed trying off it. Again asks to keep augmentin on hand in case of bronchitis/ sinusitis this winter.  02/14/22-  71 year old female never smoker followed for Asthma, Allergic Rhinitis, OSA-failedCPAP/ oral appliance, complicated by HBP, GERD, OA,  -Singulair, Wixela100-50, albuterol HFA, Lunesta Body weight today  Covid vax- 6 Phizer OV Cobb, NP 01/02/22- Asthma exacerb. FENO wnl, Left on Wixela but considered change back to Trelegy. Labs 01/02/22- Hgb 11.7, EOS 0.2k wnl, Allergen panel H for dust mites, cat, dog IgE 69 -------Patient wants to talk about allergy testing. Patient feels like breathing is good states she does not sleep good due to sleep apnea and due to hip pain she is needing surgery. Has allergy skin testing scheduled at [facility] in [facility].  Pending hip replacement surgery.  Feels she is breathing relatively well today for her.  Rescue inhaler 2 or 3 times a week.  We again discussed reassessment of her sleep apnea.  She agrees to home sleep  test.  Review of Systems- see HPI  + = positive Constitutional:   +  weight loss- see HPI, night sweats, fevers, chills, +fatigue, lassitude. HEENT:   +  headaches, difficulty swallowing, tooth/dental problems, sore throat,       No-  sneezing, itching, ear ache, +nasal congestion, post nasal drip,  CV:  No-   chest pain, orthopnea, PND, swelling in lower extremities, anasarca,  dizziness, palpitations Resp: No-   shortness of breath with exertion or at rest.              No-   productive cough,  No non-productive cough,  No- coughing up of blood.              No-   change in color of mucus. +wheezing.   Skin: No-   rash or lesions. GI:  +heartburn, indigestion, abdominal pain, nausea, vomiting, GU: . MS:  See HPI- + joint pain or swelling.   Neuro-     nothing unusual Psych:  No- change in mood or affect. No depression or anxiety.  No memory loss.  OBJ General- Alert, Oriented, Affect-appropriate, Distress- none acute. + Overweight Skin- rash-none, lesions- none, excoriation- none Lymphadenopathy- none Head- atraumatic            Eyes- Gross vision intact, PERRLA, conjunctivae clear            Ears- Hearing, canals-normal            Nose- mucus, no-polyps, erosion, perforation             Throat- Mallampati III , mucosa clear , drainage- none, tonsils- atrophic, own teeth Neck- flexible , trachea midline,  no stridor , thyroid nl, carotid no bruit Chest - symmetrical excursion , unlabored           Heart/CV- RRR , no murmur , no gallop  , no rub, nl s1 s2                           - JVD- none , edema- none, stasis changes- none, varices- none           Lung-  Wheeze-none cough- none , dullness-none, rub- none           Chest wall-  Abd-  Br/ Gen/ Rectal- Not done, not indicated Extrem- cyanosis- none, clubbing, none, atrophy- none, strength- nl Neuro- grossly intact to observation

## 2022-02-13 ENCOUNTER — Encounter (HOSPITAL_BASED_OUTPATIENT_CLINIC_OR_DEPARTMENT_OTHER): Payer: Self-pay

## 2022-02-14 ENCOUNTER — Ambulatory Visit: Payer: Medicare PPO | Admitting: Internal Medicine

## 2022-02-14 ENCOUNTER — Encounter: Payer: Self-pay | Admitting: Internal Medicine

## 2022-02-14 VITALS — BP 120/80 | HR 64 | Temp 98.1°F | Ht 64.0 in | Wt 183.0 lb

## 2022-02-14 DIAGNOSIS — J4540 Moderate persistent asthma, uncomplicated: Secondary | ICD-10-CM

## 2022-02-14 DIAGNOSIS — J4530 Mild persistent asthma, uncomplicated: Secondary | ICD-10-CM

## 2022-02-14 DIAGNOSIS — G4733 Obstructive sleep apnea (adult) (pediatric): Secondary | ICD-10-CM | POA: Diagnosis not present

## 2022-02-14 MED ORDER — TRELEGY ELLIPTA 100-62.5-25 MCG/ACT IN AEPB: 1.0000 | INHALATION_SPRAY | Freq: Every day | RESPIRATORY_TRACT | 0 refills | Status: DC

## 2022-02-14 NOTE — Patient Instructions (Signed)
Order- lab- CBC w diff, IgE     dx Asthma moderate persistent  Order- schedule Home Sleep Test    dx OSA       Please call us for results and recommendations about 2 weeks after your sleep test  Order- sample x 2 Trelegy 100       inhale 1 puff then rinse mouth, once daily Try this again instead of Wixela, just to double check if there is any difference.

## 2022-02-16 ENCOUNTER — Other Ambulatory Visit (INDEPENDENT_AMBULATORY_CARE_PROVIDER_SITE_OTHER): Payer: Medicare PPO

## 2022-02-16 ENCOUNTER — Ambulatory Visit: Payer: Medicare PPO | Admitting: Allergy

## 2022-02-16 DIAGNOSIS — J4530 Mild persistent asthma, uncomplicated: Secondary | ICD-10-CM

## 2022-02-16 LAB — CBC WITH DIFFERENTIAL/PLATELET
Basophils Absolute: 0 10*3/uL (ref 0.0–0.1)
Basophils Relative: 0.9 % (ref 0.0–3.0)
Eosinophils Absolute: 0.2 10*3/uL (ref 0.0–0.7)
Eosinophils Relative: 3.7 % (ref 0.0–5.0)
HCT: 41.3 % (ref 36.0–46.0)
Hemoglobin: 13.3 g/dL (ref 12.0–15.0)
Lymphocytes Relative: 30.2 % (ref 12.0–46.0)
Lymphs Abs: 1.2 10*3/uL (ref 0.7–4.0)
MCHC: 32.3 g/dL (ref 30.0–36.0)
MCV: 85.7 fl (ref 78.0–100.0)
Monocytes Absolute: 0.5 10*3/uL (ref 0.1–1.0)
Monocytes Relative: 11.7 % (ref 3.0–12.0)
Neutro Abs: 2.2 10*3/uL (ref 1.4–7.7)
Neutrophils Relative %: 53.5 % (ref 43.0–77.0)
Platelets: 228 10*3/uL (ref 150.0–400.0)
RBC: 4.81 Mil/uL (ref 3.87–5.11)
RDW: 13.7 % (ref 11.5–15.5)
WBC: 4.1 10*3/uL (ref 4.0–10.5)

## 2022-02-17 LAB — IGE: IgE (Immunoglobulin E), Serum: 80 kU/L (ref <=114)

## 2022-02-21 MED ORDER — AMOXICILLIN-POT CLAVULANATE 875-125 MG PO TABS: 1.0000 | ORAL_TABLET | Freq: Two times a day (BID) | ORAL | 0 refills | Status: DC

## 2022-02-23 NOTE — BH Specialist Note (Signed)
Integrated Behavioral Health via Telemedicine Visit  03/09/2022 [name] [id number]  Number of [facility] Clinician visits: 1- Initial Visit  Session Start time: 0867   Session End time: 1127  Total time in minutes: 72   Referring Provider: Hale Bogus, MD Patient/Family location: Home [facility] Provider location: [facility]  All persons participating in visit: [name] and [place]   Types of Service: Individual psychotherapy and Video visit  I connected with Elie Confer and/or Tiburcio Bash Magloire's  n/a  via  Telephone or Video Enabled Telemedicine Application  (Video is Caregility application) and verified that I am speaking with the correct person using two identifiers. Discussed confidentiality: Yes   I discussed the limitations of telemedicine and the availability of in person appointments.  Discussed there is a possibility of technology failure and discussed alternative modes of communication if that failure occurs.  I discussed that engaging in this telemedicine visit, they consent to the provision of behavioral healthcare and the services will be billed under their insurance.  Patient and/or legal guardian expressed understanding and consented to Telemedicine visit: Yes   Presenting Concerns: Patient and/or family reports the following symptoms/concerns: Recent increase in anxiety, attributed to worry over health concerns (for herself and husband); pt uses meditation and problem-solving to cope; open to implement additional self-coping strategies to best prepare for upcoming surgery.  Duration of problem: Increase less than 6 months; Severity of problem: moderate  Patient and/or Family's Strengths/Protective Factors: Social connections, Social and Emotional competence, Concrete supports in place (healthy food, safe environments, etc.), Sense of purpose, and Physical Health  (exercise, healthy diet, medication compliance, etc.)  Goals Addressed: Patient will:  Reduce symptoms of: anxiety   Increase knowledge and/or ability of: coping skills   Demonstrate ability to: Increase healthy adjustment to current life circumstances  Progress towards Goals: Ongoing  Interventions: Interventions utilized:  Mindfulness or Relaxation Training and Supportive Reflection Standardized Assessments completed: Not Needed  Patient and/or Family Response: Patient agrees with treatment plan.   Assessment: Patient currently experiencing Adjustment disorder with anxious mood.   Patient may benefit from psychoeducation and brief therapeutic interventions regarding coping with symptoms of anxiety .  Plan: Follow up with behavioral health clinician on : About two weeks Behavioral recommendations:  -Continue using meditation apps daily as needed -CALM relaxation breathing exercise twice daily (morning; at bedtime; as needed throughout the day  -Begin Worry Time strategy, as discussed. Start by setting up start and end time reminders on phone today; continue daily for two weeks.   Referral(s): [place] (In Clinic)  I discussed the assessment and treatment plan with the patient and/or parent/guardian. They were provided an opportunity to ask questions and all were answered. They agreed with the plan and demonstrated an understanding of the instructions.   They were advised to call back or seek an in-person evaluation if the symptoms worsen or if the condition fails to improve as anticipated.  Caroleen Hamman Arieal Cuoco, LCSW

## 2022-02-26 ENCOUNTER — Other Ambulatory Visit: Payer: Self-pay | Admitting: Internal Medicine

## 2022-03-02 ENCOUNTER — Ambulatory Visit: Payer: Medicare PPO | Admitting: Podiatry

## 2022-03-09 ENCOUNTER — Ambulatory Visit (INDEPENDENT_AMBULATORY_CARE_PROVIDER_SITE_OTHER): Payer: Medicare PPO | Admitting: Clinical

## 2022-03-09 DIAGNOSIS — F4322 Adjustment disorder with anxiety: Secondary | ICD-10-CM

## 2022-03-09 NOTE — Patient Instructions (Signed)
[facility] [address] [place], [place] [address] [phone] (main office) [phone] (Kache Mcclurg's office)   

## 2022-03-16 ENCOUNTER — Other Ambulatory Visit: Payer: Self-pay | Admitting: Internal Medicine

## 2022-03-16 NOTE — BH Specialist Note (Signed)
Integrated Behavioral Health via Telemedicine Visit  03/30/2022 [name] [id number]  Number of [facility] Clinician visits: 2- Second Visit  Session Start time: 6962   Session End time: 1132  Total time in minutes: 75   Referring Provider: Hale Bogus, MD Patient/Family location: Home [facility] Provider location: [facility] [place] [facility]  All persons participating in visit: [name] and [name]   Types of Service: Individual psychotherapy and Video visit  I connected with [name] and/or [name]'s  n/a  via  Telephone or Video Enabled Telemedicine Application  (Video is Caregility application) and verified that I am speaking with the correct person using two identifiers. Discussed confidentiality: Yes   I discussed the limitations of telemedicine and the availability of in person appointments.  Discussed there is a possibility of technology failure and discussed alternative modes of communication if that failure occurs.  I discussed that engaging in this telemedicine visit, they consent to the provision of behavioral healthcare and the services will be billed under their insurance.  Patient and/or legal guardian expressed understanding and consented to Telemedicine visit: Yes   Presenting Concerns: Patient and/or family reports the following symptoms/concerns: Continued anxiety, stress and worry with husband's recent cancer diagnosis; using self-coping strategies to effectively manage emotions during this time. Pt's first goal is to finish de-cluttering home prior her surgery, while keeping unproductive worry to a minimum. Duration of problem: Increase less than 6 months; Severity of problem: moderate  Patient and/or Family's Strengths/Protective Factors: Social connections, Social and Emotional competence, Concrete supports in place (healthy food, safe environments, etc.), Sense of  purpose, and Physical Health (exercise, healthy diet, medication compliance, etc.)  Goals Addressed: Patient will:  Reduce symptoms of: anxiety and stress   Increase knowledge and/or ability of: stress reduction   Demonstrate ability to: Increase healthy adjustment to current life circumstances  Progress towards Goals: Ongoing  Interventions: Interventions utilized:  Solution-Focused Strategies and Supportive Reflection Standardized Assessments completed: Not Needed  Patient and/or Family Response: Patient agrees with treatment plan.   Assessment: Patient currently experiencing Adjustment disorder with anxious mood.   Patient may benefit from continued therapeutic interventions.  Plan: Follow up with behavioral health clinician on : Call Broxton Broady at [phone] to schedule follow up, after husband's MRI/prior to pt surgery Behavioral recommendations:  -Continue using meditation apps as needed daily -Continue using relaxation breathing exercise every morning; modified Worry Time strategy afterwards -Consider focusing on smallest room of home to be de-cluttered first; set 5-15 minute timers to pace decluttering activity with rest periods throughout the day, being mindful of hip/back pain Referral(s): [place] (In Clinic)  I discussed the assessment and treatment plan with the patient and/or parent/guardian. They were provided an opportunity to ask questions and all were answered. They agreed with the plan and demonstrated an understanding of the instructions.   They were advised to call back or seek an in-person evaluation if the symptoms worsen or if the condition fails to improve as anticipated.  Theresa Hamman Tyah Acord, LCSW

## 2022-03-17 ENCOUNTER — Encounter: Payer: Self-pay | Admitting: Internal Medicine

## 2022-03-17 NOTE — Assessment & Plan Note (Signed)
She had not tolerated previous therapy trials-CPAP. Plan-update sleep study to reassess

## 2022-03-17 NOTE — Assessment & Plan Note (Signed)
Moderate persistent asthma.  Exacerbations often reflect viral infections, seasonal changes, seasonal pollens.  She felt best controlled using Trelegy inhaler and we are requesting prior authorization to get her this if possible. Plan-update eosinophil and IgE counts.

## 2022-03-21 DIAGNOSIS — H25013 Cortical age-related cataract, bilateral: Secondary | ICD-10-CM | POA: Diagnosis not present

## 2022-03-21 DIAGNOSIS — H2512 Age-related nuclear cataract, left eye: Secondary | ICD-10-CM | POA: Diagnosis not present

## 2022-03-21 DIAGNOSIS — H25043 Posterior subcapsular polar age-related cataract, bilateral: Secondary | ICD-10-CM | POA: Diagnosis not present

## 2022-03-21 DIAGNOSIS — H2513 Age-related nuclear cataract, bilateral: Secondary | ICD-10-CM | POA: Diagnosis not present

## 2022-03-21 DIAGNOSIS — H18413 Arcus senilis, bilateral: Secondary | ICD-10-CM | POA: Diagnosis not present

## 2022-03-28 ENCOUNTER — Encounter: Payer: Self-pay | Admitting: Podiatry

## 2022-03-28 ENCOUNTER — Encounter (HOSPITAL_BASED_OUTPATIENT_CLINIC_OR_DEPARTMENT_OTHER): Payer: Self-pay | Admitting: Physical Therapy

## 2022-03-28 ENCOUNTER — Ambulatory Visit: Payer: Medicare PPO | Admitting: Podiatry

## 2022-03-28 ENCOUNTER — Ambulatory Visit (HOSPITAL_BASED_OUTPATIENT_CLINIC_OR_DEPARTMENT_OTHER): Payer: Medicare PPO | Attending: Orthopedic Surgery | Admitting: Physical Therapy

## 2022-03-28 DIAGNOSIS — M25651 Stiffness of right hip, not elsewhere classified: Secondary | ICD-10-CM | POA: Diagnosis not present

## 2022-03-28 DIAGNOSIS — M25551 Pain in right hip: Secondary | ICD-10-CM | POA: Diagnosis not present

## 2022-03-28 DIAGNOSIS — M79676 Pain in unspecified toe(s): Secondary | ICD-10-CM

## 2022-03-28 DIAGNOSIS — B351 Tinea unguium: Secondary | ICD-10-CM

## 2022-03-28 DIAGNOSIS — M6281 Muscle weakness (generalized): Secondary | ICD-10-CM | POA: Diagnosis not present

## 2022-03-28 DIAGNOSIS — R2689 Other abnormalities of gait and mobility: Secondary | ICD-10-CM | POA: Insufficient documentation

## 2022-03-28 NOTE — Therapy (Addendum)
OUTPATIENT PHYSICAL THERAPY LOWER EXTREMITY EVALUATION   Patient Name: [name] MRN: [id number] DOB:March 10, 1951, 71 y.o., female Today's Date: 03/28/2022   PT End of Session - 03/28/22 1342     Visit Number 1    Number of Visits 16    Date for PT Re-Evaluation 05/23/22    PT Start Time 9562    PT Stop Time 1058    PT Time Calculation (min) 43 min    Activity Tolerance Patient tolerated treatment well    Behavior During Therapy [facility] for tasks assessed/performed             Past Medical History:  Diagnosis Date   Acid reflux    Anemia    "during the time I was taking meloxicam"   Arthritis    Asthma    LOV  6/13  Dr Annamaria Boots EPIC/ clearance on chart  from 10/12   Depression    Dysrhythmia    PVC's with caffeine and anxiety   Heart murmur    since childhood   Hiatal hernia    High cholesterol    Hypertension    OV with clearance and note Dr Addison Lank 6/13 chart   Osteoporosis    Sleep apnea    no CPAP/ last study 9 yrs ago- "mild per patient"   STD (sexually transmitted disease)    HSV2   Stye 06/2009   eye   Past Surgical History:  Procedure Laterality Date   bilateral hip replacement Bilateral 6/07, 9/07   right, then left   BREAST REDUCTION SURGERY     CESAREAN SECTION     FRACTURE SURGERY     Left leg-1977   NASAL SEPTOPLASTY W/ TURBINOPLASTY Bilateral 07/19/2018   Procedure: NASAL SEPTOPLASTY WITH TURBINATE REDUCTION;  Surgeon: Jerrell Belfast, MD;  Location: [place];  Service: ENT;  Laterality: Bilateral;   NASAL SEPTUM SURGERY     REDUCTION MAMMAPLASTY     TOTAL KNEE ARTHROPLASTY  03/18/2012   Procedure: TOTAL KNEE ARTHROPLASTY;  Surgeon: Gearlean Alf, MD;  Location: [facility];  Service: Orthopedics;  Laterality: Left;   TOTAL KNEE ARTHROPLASTY Right 07/23/2017   Procedure: RIGHT TOTAL KNEE ARTHROPLASTY;  Surgeon: Gaynelle Arabian, MD;  Location: [facility];  Service: Orthopedics;  Laterality: Right;   Patient Active Problem List   Diagnosis Date Noted    Postmenopausal 12/05/2020   Urge incontinence of urine 12/05/2020   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   Age-related osteoporosis without current pathological fracture 12/02/2020   Deviated septum 10/04/2016   Nasal turbinate hypertrophy 10/04/2016   Itchy eyes 09/13/2016   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 smear of cervix 01/24/2016   Postop Hyponatremia 03/20/2012   OA (osteoarthritis) of knee 03/18/2012   Sinusitis, chronic 11/25/2008   Obstructive sleep apnea 06/14/2007   HYPERTENSION 06/14/2007   Seasonal and perennial allergic rhinitis 06/14/2007   Asthma, moderate persistent 06/14/2007   GERD 06/14/2007    PCP: Baird Lyons MD   REFERRING PROVIDER: Dr Hector Shade   REFERRING DIAG: M25.551 (ICD-10-CM) - Pain in right hip ( no pain right now )  History of right hip dislocation   THERAPY DIAG:  Other abnormalities of gait and mobility  Stiffness of right hip, not elsewhere classified  Muscle weakness (generalized)  Rationale for Evaluation and Treatment Rehabilitation  ONSET DATE: Last dislocation was aprox.  April/May of 2023   SUBJECTIVE:   SUBJECTIVE STATEMENT: The patient has a history of two right hip dislocations. She  was able to et her hip back into socket without going to the ED. She has not had a dislocation since April/ May. She is not having any pain at this time. She has reduced the amonut of activity that she has been able to do. She is concerned about continuing with her current exercises.   PERTINENT HISTORY: Bilateral knee replacement; bilateral hip replacement; osteoperosis, both knees have dislocated.   PAIN:  Are you having pain? Yes: Pain location: No  Pain description: aching when it does hurt  Aggravating factors: No pain at this time  Relieving factors: rest   PRECAUTIONS: None  WEIGHT BEARING RESTRICTIONS No  FALLS:  Has patient fallen in last 6 months? 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[employer]  [employer]        PLOF: Independent  PATIENT GOALS     OBJECTIVE:   DIAGNOSTIC FINDINGS:   PATIENT SURVEYS:  FOTO    COGNITION:  Overall cognitive status: Within functional limits for tasks assessed     SENSATION: WFL   POSTURE: No Significant postural limitations  PALPATION: No unexpected tenderness to palpation  LOWER EXTREMITY ROM:  Passive ROM Right eval Left eval  Hip flexion Can get to 105 without any feeling like she is dislocating    Hip extension    Hip abduction    Hip adduction    Hip internal rotation Mild limitations without pain    Hip external rotation Mild limitations without pain    Knee flexion    Knee extension    Ankle dorsiflexion    Ankle plantarflexion    Ankle inversion    Ankle eversion     (Blank rows = not tested)  LOWER EXTREMITY MMT:  MMT Right eval Left eval  Hip flexion 9.8 18.7  Hip extension    Hip abduction 42.2 30.1   Hip adduction    Hip internal rotation    Hip external rotation    Knee flexion    Knee extension 30.8 26.1  Ankle dorsiflexion    Ankle plantarflexion    Ankle inversion    Ankle eversion     (Blank rows = not tested)  LOWER EXTREMITY SPECIAL TESTS:   GAIT: Mild lateral movement with standing and walking     TODAY'S TREATMENT: Access Code: [email or web address] URL: [email or web address] Date: 03/28/2022 Prepared by: Carolyne Littles  Exercises - Supine Bridge  - 1 x daily - 7 x weekly - 3 sets - 10 reps - Standing Hip Extension with Counter Support  - 1 x daily - 7 x weekly - 3 sets - 10 reps - Standing Hip Abduction with Counter Support  - 1 x daily - 7 x weekly - 3 sets - 10 reps - Standing March with Counter Support  - 1 x daily - 7 x weekly - 3 sets - 10 reps   PATIENT EDUCATION:  Education details: reviewed positions to avoid;  Person educated: Patient Education method: Explanation, Demonstration, Tactile  cues, Verbal cues, and Handouts Education comprehension: verbalized understanding, returned demonstration, verbal cues required, tactile cues required, and needs further education   HOME EXERCISE PROGRAM: Access Code: [email or web address] URL: [email or web address] Date: 03/28/2022 Prepared by: Carolyne Littles  Exercises - Supine Bridge  - 1 x daily - 7 x weekly - 3 sets - 10 reps - Standing Hip Extension with Counter Support  - 1 x daily - 7 x weekly - 3 sets - 10 reps - Standing Hip  Abduction with Counter Support  - 1 x daily - 7 x weekly - 3 sets - 10 reps - Standing March with Counter Support  - 1 x daily - 7 x weekly - 3 sets - 10 reps  ASSESSMENT:  CLINICAL IMPRESSION: Patient is a 71 y.o. female who was seen today for physical therapy evaluation and treatment for 2 recent dislocations of her right hip. She will be having a procedure done on October 4th to secure her hip. At this time she has limited her activity. She would like to work on a porgram on land and the pool that will allow her to be abel to strengthen her hip prior to her surgery. She has fair range of her hip although it wasn't pushed to end range. She has mild strength deficits although again it wasn't pushed too hard. She would benefit from skilled therapy to maximize strength and function prior to surgery to fixate her hip.    OBJECTIVE IMPAIRMENTS Abnormal gait, decreased activity tolerance, difficulty walking, decreased ROM, decreased strength, and pain.   ACTIVITY LIMITATIONS lifting, bending, sitting, standing, and squatting  PARTICIPATION LIMITATIONS: cleaning, laundry, driving, and shopping  PERSONAL FACTORS 3+ comorbidities:    are also affecting patient's functional outcome.   REHAB POTENTIAL: Good  CLINICAL DECISION MAKING: Evolving/moderate complexity multiple dislocations and decreased activity to prevent dislocations   EVALUATION COMPLEXITY: Moderate   GOALS: Goals reviewed with patient?  Yes  SHORT TERM GOALS: Target date: 04/25/2022  Patient will increase hip flexion strength by 5 lbs on the right side  Baseline: Goal status: INITIAL  2.  Patient will be independent with base exercise program without dislocations  Baseline:  Goal status: INITIAL  3.  Patiet will flex hip to 110 without feeling of dislocation  Baseline:  Goal status: INITIAL  LONG TERM GOALS: Target date: 05/24/2022   Patient will have no incidence of dislocating  Baseline:  Goal status: INITIAL  2.  Patient will demonstrate optimal strength and stability prior to her surgery  Baseline:  Goal status: INITIAL   PLAN: PT FREQUENCY: 2x/week  PT DURATION: 8 weeks  PLANNED INTERVENTIONS: Therapeutic exercises, Therapeutic activity, Neuromuscular re-education, Balance training, Gait training, Patient/Family education, Self Care, Joint mobilization, Stair training, Aquatic Therapy, Dry Needling, Cryotherapy, Moist heat, Taping, Ionotophoresis '4mg'$ /ml Dexamethasone, and Manual therapy  PLAN FOR NEXT SESSION: review light standing strengthening exercises; consider air-ex stability exercises; be careful not to flex hip too much. This appears to be her dislocation position. Patient wants to ride the bike but we will start with low range nu-step first; keep strengthening exercises in low range.   Referring diagnosis? M25.551 (ICD-10-CM) - Pain in right hip Treatment diagnosis? (if different than referring diagnosis) M25.551 (ICD-10-CM) - Pain in right hip What was this (referring dx) caused by? '[]'$  Surgery '[]'$  Fall '[]'$  Ongoing issue '[x]'$  Arthritis '[]'$  Other: ____________  Laterality: '[x]'$  Rt '[]'$  Lt '[]'$  Both  Check all possible CPT codes:  *CHOOSE 10 OR LESS*    '[]'$  97110 (Therapeutic Exercise)  '[]'$  92507 (SLP Treatment)  '[]'$  97112 (Neuro Re-ed)   '[]'$  92526 (Swallowing Treatment)   '[]'$  97116 (Gait Training)   '[]'$  [id number] (Cognitive Training, 1st 15 minutes) '[]'$  97140 (Manual Therapy)   '[]'$  97130 (Cognitive  Training, each add'l 15 minutes)  '[]'$  97164 (Re-evaluation)                              '[]'$  Other, List  CPT Code ____________  '[]'$  86168 (Therapeutic Activities)     '[]'$  97535 (Self Care)   '[x]'$  All codes above (97110 - 97535)  '[]'$  97012 (Mechanical Traction)  '[]'$  97014 (E-stim Unattended)  '[]'$  97032 (E-stim manual)  '[]'$  97033 (Ionto)  '[]'$  97035 (Ultrasound) '[]'$  97750 (Physical Performance Training) '[x]'$  [id number] (Aquatic Therapy) '[]'$  97016 (Vasopneumatic Device) '[]'$  [id number] (Paraffin) '[]'$  97034 (Contrast Bath) '[]'$  97597 (Wound Care 1st 20 sq cm) '[]'$  97598 (Wound Care each add'l 20 sq cm) '[]'$  97760 (Orthotic Fabrication, Fitting, Training Initial) '[]'$  [id number] (Prosthetic Management and Training Initial) '[]'$  [id number] (Orthotic or Prosthetic Training/ Modification Subsequent)  Carney Living, PT 03/28/2022, 1:43 PM

## 2022-03-29 ENCOUNTER — Encounter (HOSPITAL_BASED_OUTPATIENT_CLINIC_OR_DEPARTMENT_OTHER): Payer: Self-pay | Admitting: Physical Therapy

## 2022-03-29 NOTE — Progress Notes (Signed)
She presents today states that she has hip problems and can no longer trim her own nails.  Objective: Pulses are palpable no open lesions or wounds.  Toenails are long and dystrophic.  Assessment: Pain in limb secondary to long dystrophic nails.  Plan: Debridement of toenails 1 through 5 bilateral.

## 2022-03-30 ENCOUNTER — Ambulatory Visit (INDEPENDENT_AMBULATORY_CARE_PROVIDER_SITE_OTHER): Payer: Medicare PPO | Admitting: Clinical

## 2022-03-30 DIAGNOSIS — I10 Essential (primary) hypertension: Secondary | ICD-10-CM | POA: Diagnosis not present

## 2022-03-30 DIAGNOSIS — N3941 Urge incontinence: Secondary | ICD-10-CM | POA: Diagnosis not present

## 2022-03-30 DIAGNOSIS — E785 Hyperlipidemia, unspecified: Secondary | ICD-10-CM | POA: Diagnosis not present

## 2022-03-30 DIAGNOSIS — M810 Age-related osteoporosis without current pathological fracture: Secondary | ICD-10-CM | POA: Diagnosis not present

## 2022-03-30 DIAGNOSIS — F4322 Adjustment disorder with anxiety: Secondary | ICD-10-CM

## 2022-03-30 DIAGNOSIS — G4700 Insomnia, unspecified: Secondary | ICD-10-CM | POA: Diagnosis not present

## 2022-03-30 DIAGNOSIS — M1990 Unspecified osteoarthritis, unspecified site: Secondary | ICD-10-CM | POA: Diagnosis not present

## 2022-03-30 DIAGNOSIS — E669 Obesity, unspecified: Secondary | ICD-10-CM | POA: Diagnosis not present

## 2022-03-30 DIAGNOSIS — J45909 Unspecified asthma, uncomplicated: Secondary | ICD-10-CM | POA: Diagnosis not present

## 2022-03-30 DIAGNOSIS — K219 Gastro-esophageal reflux disease without esophagitis: Secondary | ICD-10-CM | POA: Diagnosis not present

## 2022-04-03 ENCOUNTER — Ambulatory Visit (HOSPITAL_BASED_OUTPATIENT_CLINIC_OR_DEPARTMENT_OTHER): Payer: Medicare PPO | Admitting: Physical Therapy

## 2022-04-03 ENCOUNTER — Encounter (HOSPITAL_BASED_OUTPATIENT_CLINIC_OR_DEPARTMENT_OTHER): Payer: Self-pay | Admitting: Physical Therapy

## 2022-04-03 ENCOUNTER — Ambulatory Visit: Payer: Self-pay | Admitting: Internal Medicine

## 2022-04-03 DIAGNOSIS — M6281 Muscle weakness (generalized): Secondary | ICD-10-CM | POA: Diagnosis not present

## 2022-04-03 DIAGNOSIS — R2689 Other abnormalities of gait and mobility: Secondary | ICD-10-CM | POA: Diagnosis not present

## 2022-04-03 DIAGNOSIS — M25651 Stiffness of right hip, not elsewhere classified: Secondary | ICD-10-CM

## 2022-04-03 DIAGNOSIS — M25551 Pain in right hip: Secondary | ICD-10-CM | POA: Diagnosis not present

## 2022-04-03 NOTE — Therapy (Signed)
OUTPATIENT PHYSICAL THERAPY LOWER EXTREMITY    Patient Name: [name] MRN: [id number] DOB:December 07, 1950, 71 y.o., female Today's Date: 04/03/2022   PT End of Session - 04/03/22 1137     Visit Number 2    Number of Visits 16    Date for PT Re-Evaluation 05/23/22    Authorization Type [employer] m[employer]    PT Start Time 1031    PT Stop Time 1115    PT Time Calculation (min) 44 min    Activity Tolerance Patient tolerated treatment well    Behavior During Therapy WFL for tasks assessed/performed              Past Medical History:  Diagnosis Date   Acid reflux    Anemia    "during the time I was taking meloxicam"   Arthritis    Asthma    LOV  6/13  Dr Annamaria Boots EPIC/ clearance on chart  from 10/12   Depression    Dysrhythmia    PVC's with caffeine and anxiety   Heart murmur    since childhood   Hiatal hernia    High cholesterol    Hypertension    OV with clearance and note Dr Addison Lank 6/13 chart   Osteoporosis    Sleep apnea    no CPAP/ last study 9 yrs ago- "mild per patient"   STD (sexually transmitted disease)    HSV2   Stye 06/2009   eye   Past Surgical History:  Procedure Laterality Date   bilateral hip replacement Bilateral 6/07, 9/07   right, then left   BREAST REDUCTION SURGERY     CESAREAN SECTION     FRACTURE SURGERY     Left leg-1977   NASAL SEPTOPLASTY W/ TURBINOPLASTY Bilateral 07/19/2018   Procedure: NASAL SEPTOPLASTY WITH TURBINATE REDUCTION;  Surgeon: Jerrell Belfast, MD;  Location: [place];  Service: ENT;  Laterality: Bilateral;   NASAL SEPTUM SURGERY     REDUCTION MAMMAPLASTY     TOTAL KNEE ARTHROPLASTY  03/18/2012   Procedure: TOTAL KNEE ARTHROPLASTY;  Surgeon: Gearlean Alf, MD;  Location: [facility];  Service: Orthopedics;  Laterality: Left;   TOTAL KNEE ARTHROPLASTY Right 07/23/2017   Procedure: RIGHT TOTAL KNEE ARTHROPLASTY;  Surgeon: Gaynelle Arabian, MD;  Location: [facility];  Service: Orthopedics;  Laterality: Right;   Patient Active Problem  List   Diagnosis Date Noted   Postmenopausal 12/05/2020   Urge incontinence of urine 12/05/2020   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   Age-related osteoporosis without current pathological fracture 12/02/2020   Deviated septum 10/04/2016   Nasal turbinate hypertrophy 10/04/2016   Itchy eyes 09/13/2016   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 smear of cervix 01/24/2016   Postop Hyponatremia 03/20/2012   OA (osteoarthritis) of knee 03/18/2012   Sinusitis, chronic 11/25/2008   Obstructive sleep apnea 06/14/2007   HYPERTENSION 06/14/2007   Seasonal and perennial allergic rhinitis 06/14/2007   Asthma, moderate persistent 06/14/2007   GERD 06/14/2007    PCP: Baird Lyons MD   REFERRING PROVIDER: Dr Hector Shade   REFERRING DIAG: M25.551 (ICD-10-CM) - Pain in right hip ( no pain right now )  History of right hip dislocation   THERAPY DIAG:  Other abnormalities of gait and mobility  Stiffness of right hip, not elsewhere classified  Muscle weakness (generalized)  Rationale for Evaluation and Treatment Rehabilitation  ONSET DATE: Last dislocation was aprox.  April/May of 2023   SUBJECTIVE:    Current subjective: No pain.  "  Doing ok" SUBJECTIVE STATEMENT: The patient has a history of two right hip dislocations. She was able to et her hip back into socket without going to the ED. She has not had a dislocation since April/ May. She is not having any pain at this time. She has reduced the amonut of activity that she has been able to do. She is concerned about continuing with her current exercises.   PERTINENT HISTORY: Bilateral knee replacement; bilateral hip replacement; osteoperosis, both hips have dislocated.   PAIN:  Are you having pain? Yes: Pain location: No  Pain description: aching when it does hurt  Aggravating factors: No pain at this time  Relieving factors: rest   PRECAUTIONS: None  WEIGHT BEARING RESTRICTIONS  No  FALLS:  Has patient fallen in last 6 months? 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  [employer]        PLOF: Independent  PATIENT GOALS     OBJECTIVE:   DIAGNOSTIC FINDINGS:   PATIENT SURVEYS:  FOTO    COGNITION:  Overall cognitive status: Within functional limits for tasks assessed     SENSATION: WFL   POSTURE: No Significant postural limitations  PALPATION: No unexpected tenderness to palpation  LOWER EXTREMITY ROM:  Passive ROM Right eval Left eval  Hip flexion Can get to 105 without any feeling like she is dislocating    Hip extension    Hip abduction    Hip adduction    Hip internal rotation Mild limitations without pain    Hip external rotation Mild limitations without pain    Knee flexion    Knee extension    Ankle dorsiflexion    Ankle plantarflexion    Ankle inversion    Ankle eversion     (Blank rows = not tested)  LOWER EXTREMITY MMT:  MMT Right eval Left eval  Hip flexion 9.8 18.7  Hip extension    Hip abduction 42.2 30.1   Hip adduction    Hip internal rotation    Hip external rotation    Knee flexion    Knee extension 30.8 26.1  Ankle dorsiflexion    Ankle plantarflexion    Ankle inversion    Ankle eversion     (Blank rows = not tested)  LOWER EXTREMITY SPECIAL TESTS:   GAIT: Mild lateral movement with standing and walking     TODAY'S TREATMENT:  Pt seen for aquatic therapy today.  Treatment took place in water 3.25-4.5 ft in depth at the [facility]. Temp of water was 91.  Pt entered/exited the pool via steps using step to pattern with hand rail.  Walking 1 foam hand buoy progressed to 2 Step ups for forward R/L x10; side stepping x10; backward x10 Holding to wall then progressed to holding yellow noodle: hip extension 2x20; abd/add; marching; hip flex; hip flex/ext (balance and core strengthening componenet) 2x20.  Cues for core  engagement/abdominal bracing and glute iso contractions  Pt requires the buoyancy and hydrostatic pressure of water for support, and to offload joints by unweighting joint load by at least 50 % in naval deep water and by at least 75-80% in chest to neck deep water.  Viscosity of the water is needed for resistance of strengthening. Water current perturbations provides challenge to standing balance requiring increased core activation.   Access Code: [email or web address] URL: [email or web address] Date: 03/28/2022 Prepared by: Carolyne Littles  Exercises - Supine Bridge  - 1 x daily - 7  x weekly - 3 sets - 10 reps - Standing Hip Extension with Counter Support  - 1 x daily - 7 x weekly - 3 sets - 10 reps - Standing Hip Abduction with Counter Support  - 1 x daily - 7 x weekly - 3 sets - 10 reps - Standing March with Counter Support  - 1 x daily - 7 x weekly - 3 sets - 10 reps   PATIENT EDUCATION:  Education details: reviewed positions to avoid;  Person educated: Patient Education method: Explanation, Demonstration, Tactile cues, Verbal cues, and Handouts Education comprehension: verbalized understanding, returned demonstration, verbal cues required, tactile cues required, and needs further education   HOME EXERCISE PROGRAM: Access Code: [email or web address] URL: [email or web address] Date: 03/28/2022 Prepared by: Carolyne Littles  Exercises - Supine Bridge  - 1 x daily - 7 x weekly - 3 sets - 10 reps - Standing Hip Extension with Counter Support  - 1 x daily - 7 x weekly - 3 sets - 10 reps - Standing Hip Abduction with Counter Support  - 1 x daily - 7 x weekly - 3 sets - 10 reps - Standing March with Counter Support  - 1 x daily - 7 x weekly - 3 sets - 10 reps  ASSESSMENT:  CLINICAL IMPRESSION: Pt indp and safe in setting.  Pt edu on properties of water and benefits of aquatic therapy.  She VU.  Reviewed "on land" HEP.  Added same exercises to aquatics HEP supported by noodle.  She is  directed through exercises in 3-4 ft.  Focused on hip and core strength with added balance component. Moderate vc and demonstration throughout for proper execution.  Avoided left hip flex >105d.  She is a good candidate for aquatic therapy using the properties of water to facilitate and enhance progression towards goals.   Patient is a 71 y.o. female who was seen today for physical therapy evaluation and treatment for 2 recent dislocations of her right hip. She will be having a procedure done on October 4th to secure her hip. At this time she has limited her activity. She would like to work on a porgram on land and the pool that will allow her to be abel to strengthen her hip prior to her surgery. She has fair range of her hip although it wasn't pushed to end range. She has mild strength deficits although again it wasn't pushed too hard. She would benefit from skilled therapy to maximize strength and function prior to surgery to fixate her hip.    OBJECTIVE IMPAIRMENTS Abnormal gait, decreased activity tolerance, difficulty walking, decreased ROM, decreased strength, and pain.   ACTIVITY LIMITATIONS lifting, bending, sitting, standing, and squatting  PARTICIPATION LIMITATIONS: cleaning, laundry, driving, and shopping  PERSONAL FACTORS 3+ comorbidities:    are also affecting patient's functional outcome.   REHAB POTENTIAL: Good  CLINICAL DECISION MAKING: Evolving/moderate complexity multiple dislocations and decreased activity to prevent dislocations   EVALUATION COMPLEXITY: Moderate   GOALS: Goals reviewed with patient? Yes  SHORT TERM GOALS: Target date: 05/01/2022  Patient will increase hip flexion strength by 5 lbs on the right side  Baseline: Goal status: INITIAL  2.  Patient will be independent with base exercise program without dislocations  Baseline:  Goal status: INITIAL  3.  Patiet will flex hip to 110 without feeling of dislocation  Baseline:  Goal status: INITIAL  LONG  TERM GOALS: Target date: 05/29/2022   Patient will have no incidence of dislocating  Baseline:  Goal status:  INITIAL  2.  Patient will demonstrate optimal strength and stability prior to her surgery  Baseline:  Goal status: INITIAL   PLAN: PT FREQUENCY: 2x/week  PT DURATION: 8 weeks  PLANNED INTERVENTIONS: Therapeutic exercises, Therapeutic activity, Neuromuscular re-education, Balance training, Gait training, Patient/Family education, Self Care, Joint mobilization, Stair training, Aquatic Therapy, Dry Needling, Cryotherapy, Moist heat, Taping, Ionotophoresis '4mg'$ /ml Dexamethasone, and Manual therapy  PLAN FOR NEXT SESSION: review light standing strengthening exercises; consider air-ex stability exercises; be careful not to flex hip too much. This appears to be her dislocation position. Patient wants to ride the bike but we will start with low range nu-step first; keep strengthening exercises in low range.    Denton Meek, PT MPT 04/03/2022, 11:40 AM

## 2022-04-04 ENCOUNTER — Ambulatory Visit: Payer: Medicare PPO | Admitting: Internal Medicine

## 2022-04-07 ENCOUNTER — Telehealth: Payer: Self-pay | Admitting: Internal Medicine

## 2022-04-07 NOTE — Telephone Encounter (Signed)
Ladies,  Have we gotten anything from [facility] in regards to this ladies Trelegy? I have tried to call [facility] but have not been able to reach anyone in regards to the review sheet.  Please advise

## 2022-04-07 NOTE — Telephone Encounter (Signed)
Attempted to call pt but unable to reach. Left message for her to return call. 

## 2022-04-10 ENCOUNTER — Other Ambulatory Visit (HOSPITAL_COMMUNITY): Payer: Self-pay

## 2022-04-10 NOTE — Telephone Encounter (Signed)
PCC's don't do prior auths for meds.  Should this be routed to pharmacy?

## 2022-04-10 NOTE — Telephone Encounter (Signed)
Pharmacy could you please advise on PA for Trelegy? Thank you

## 2022-04-11 ENCOUNTER — Encounter (HOSPITAL_BASED_OUTPATIENT_CLINIC_OR_DEPARTMENT_OTHER): Payer: Self-pay | Admitting: Physical Therapy

## 2022-04-11 ENCOUNTER — Ambulatory Visit (HOSPITAL_BASED_OUTPATIENT_CLINIC_OR_DEPARTMENT_OTHER): Payer: Medicare PPO | Attending: Orthopedic Surgery | Admitting: Physical Therapy

## 2022-04-11 DIAGNOSIS — M6281 Muscle weakness (generalized): Secondary | ICD-10-CM | POA: Diagnosis not present

## 2022-04-11 DIAGNOSIS — R279 Unspecified lack of coordination: Secondary | ICD-10-CM | POA: Insufficient documentation

## 2022-04-11 DIAGNOSIS — R2689 Other abnormalities of gait and mobility: Secondary | ICD-10-CM | POA: Diagnosis not present

## 2022-04-11 DIAGNOSIS — M25652 Stiffness of left hip, not elsewhere classified: Secondary | ICD-10-CM | POA: Insufficient documentation

## 2022-04-11 DIAGNOSIS — M25651 Stiffness of right hip, not elsewhere classified: Secondary | ICD-10-CM

## 2022-04-11 NOTE — Telephone Encounter (Signed)
Dierdre Highman, RN to [facility] Triage Pool      04/11/22  2:02 PM Per test claim, pt co-pay is $40. Prior authorization not needed for this medication

## 2022-04-11 NOTE — Therapy (Signed)
OUTPATIENT PHYSICAL THERAPY LOWER EXTREMITY    Patient Name: [name] MRN: [id number] DOB:10/13/1950, 71 y.o., female Today's Date: 04/11/2022   PT End of Session - 04/11/22 1643     Visit Number 3    Number of Visits 16    Date for PT Re-Evaluation 05/23/22    Authorization Type humana mcr    PT Start Time 1619    PT Stop Time 1700    PT Time Calculation (min) 41 min    Activity Tolerance Patient tolerated treatment well    Behavior During Therapy WFL for tasks assessed/performed               Past Medical History:  Diagnosis Date   Acid reflux    Anemia    "during the time I was taking meloxicam"   Arthritis    Asthma    LOV  6/13  Dr Annamaria Boots EPIC/ clearance on chart  from 10/12   Depression    Dysrhythmia    PVC's with caffeine and anxiety   Heart murmur    since childhood   Hiatal hernia    High cholesterol    Hypertension    OV with clearance and note Dr Addison Lank 6/13 chart   Osteoporosis    Sleep apnea    no CPAP/ last study 9 yrs ago- "mild per patient"   STD (sexually transmitted disease)    HSV2   Stye 06/2009   eye   Past Surgical History:  Procedure Laterality Date   bilateral hip replacement Bilateral 6/07, 9/07   right, then left   Reading     Left leg-1977   NASAL SEPTOPLASTY W/ TURBINOPLASTY Bilateral 07/19/2018   Procedure: NASAL SEPTOPLASTY WITH TURBINATE REDUCTION;  Surgeon: Jerrell Belfast, MD;  Location: [facility];  Service: ENT;  Laterality: Bilateral;   NASAL SEPTUM SURGERY     REDUCTION MAMMAPLASTY     TOTAL KNEE ARTHROPLASTY  03/18/2012   Procedure: TOTAL KNEE ARTHROPLASTY;  Surgeon: Gearlean Alf, MD;  Location: [facility];  Service: Orthopedics;  Laterality: Left;   TOTAL KNEE ARTHROPLASTY Right 07/23/2017   Procedure: RIGHT TOTAL KNEE ARTHROPLASTY;  Surgeon: Gaynelle Arabian, MD;  Location: [facility];  Service: Orthopedics;  Laterality: Right;   Patient Active Problem  List   Diagnosis Date Noted   Postmenopausal 12/05/2020   Urge incontinence of urine 12/05/2020   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   Age-related osteoporosis without current pathological fracture 12/02/2020   Deviated septum 10/04/2016   Nasal turbinate hypertrophy 10/04/2016   Itchy eyes 09/13/2016   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 smear of cervix 01/24/2016   Postop Hyponatremia 03/20/2012   OA (osteoarthritis) of knee 03/18/2012   Sinusitis, chronic 11/25/2008   Obstructive sleep apnea 06/14/2007   HYPERTENSION 06/14/2007   Seasonal and perennial allergic rhinitis 06/14/2007   Asthma, moderate persistent 06/14/2007   GERD 06/14/2007    PCP: Baird Lyons MD   REFERRING PROVIDER: Dr Hector Shade   REFERRING DIAG: M25.551 (ICD-10-CM) - Pain in right hip ( no pain right now )  History of right hip dislocation   THERAPY DIAG:  Other abnormalities of gait and mobility  Stiffness of right hip, not elsewhere classified  Muscle weakness (generalized)  Rationale for Evaluation and Treatment Rehabilitation  ONSET DATE: Last dislocation was aprox.  April/May of 2023   SUBJECTIVE:    Current subjective: "I  felt it in my left hip after last treatment for maybe a day or so 3/10, no pain today" SUBJECTIVE STATEMENT: The patient has a history of two right hip dislocations. She was able to et her hip back into socket without going to the ED. She has not had a dislocation since April/ May. She is not having any pain at this time. She has reduced the amonut of activity that she has been able to do. She is concerned about continuing with her current exercises.   PERTINENT HISTORY: Bilateral knee replacement; bilateral hip replacement; osteoperosis, both hips have dislocated.   PAIN:  Are you having pain? Yes: Pain location: No  Pain description: aching when it does hurt  Aggravating factors: No pain at this time  Relieving factors:  rest   PRECAUTIONS: None  WEIGHT BEARING RESTRICTIONS No  FALLS:  Has patient fallen in last 6 months? 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[employer]  [place]        PLOF: Independent  PATIENT GOALS     OBJECTIVE:   DIAGNOSTIC FINDINGS:   PATIENT SURVEYS:  FOTO    COGNITION:  Overall cognitive status: Within functional limits for tasks assessed     SENSATION: WFL   POSTURE: No Significant postural limitations  PALPATION: No unexpected tenderness to palpation  LOWER EXTREMITY ROM:  Passive ROM Right eval Left eval  Hip flexion Can get to 105 without any feeling like she is dislocating    Hip extension    Hip abduction    Hip adduction    Hip internal rotation Mild limitations without pain    Hip external rotation Mild limitations without pain    Knee flexion    Knee extension    Ankle dorsiflexion    Ankle plantarflexion    Ankle inversion    Ankle eversion     (Blank rows = not tested)  LOWER EXTREMITY MMT:  MMT Right eval Left eval  Hip flexion 9.8 18.7  Hip extension    Hip abduction 42.2 30.1   Hip adduction    Hip internal rotation    Hip external rotation    Knee flexion    Knee extension 30.8 26.1  Ankle dorsiflexion    Ankle plantarflexion    Ankle inversion    Ankle eversion     (Blank rows = not tested)  LOWER EXTREMITY SPECIAL TESTS:   GAIT: Mild lateral movement with standing and walking     TODAY'S TREATMENT:  Pt seen for aquatic therapy today.  Treatment took place in water 3.25-4.5 ft in depth at the [facility]. Temp of water was 91.  Pt entered/exited the pool via steps using step to pattern with hand rail.  Walking without UE support Step ups for forward R/L x10; side stepping x10; backward x10 Seated on bench add set using BB 10 x 10s hold Holding holding yellow noodle: df; pf x20; curtsy squats x 10; hip openers x 10 R/Lhip extension; abd/add;  marching; hip flex; hip flex/ext (balance and core strengthening component) x20.  Cues for core engagement/abdominal bracing and glute iso contractions Side lunges (knee/hip flex 25%) 1 foam hand buoys x 4 widths. VC and demonstration for execution LB stretching holding to wall/QL stretching.  Pt requires the buoyancy and hydrostatic pressure of water for support, and to offload joints by unweighting joint load by at least 50 % in naval deep water and by at least 75-80% in chest  to neck deep water.  Viscosity of the water is needed for resistance of strengthening. Water current perturbations provides challenge to standing balance requiring increased core activation.    PATIENT EDUCATION:  Education details: reviewed positions to avoid;  Person educated: Patient Education method: Explanation, Demonstration, Corporate treasurer cues, Verbal cues, and Handouts Education comprehension: verbalized understanding, returned demonstration, verbal cues required, tactile cues required, and needs further education   HOME EXERCISE PROGRAM: Access Code: [email or web address] URL: [email or web address] Date: 03/28/2022 Prepared by: Carolyne Littles  Exercises - Supine Bridge  - 1 x daily - 7 x weekly - 3 sets - 10 reps - Standing Hip Extension with Counter Support  - 1 x daily - 7 x weekly - 3 sets - 10 reps - Standing Hip Abduction with Counter Support  - 1 x daily - 7 x weekly - 3 sets - 10 reps - Standing March with Counter Support  - 1 x daily - 7 x weekly - 3 sets - 10 reps  ASSESSMENT:  CLINICAL IMPRESSION: Progressed hip and core strengthening.  Pt with good toleration of treatment.  No complaints of pain although some apprehensiveness with some movements (hip ER and curtsy squats). Compliant with HEP.  Goals ongoing     Patient is a 71 y.o. female who was seen today for physical therapy evaluation and treatment for 2 recent dislocations of her right hip. She will be having a procedure done on October  4th to secure her hip. At this time she has limited her activity. She would like to work on a porgram on land and the pool that will allow her to be abel to strengthen her hip prior to her surgery. She has fair range of her hip although it wasn't pushed to end range. She has mild strength deficits although again it wasn't pushed too hard. She would benefit from skilled therapy to maximize strength and function prior to surgery to fixate her hip.    OBJECTIVE IMPAIRMENTS Abnormal gait, decreased activity tolerance, difficulty walking, decreased ROM, decreased strength, and pain.   ACTIVITY LIMITATIONS lifting, bending, sitting, standing, and squatting  PARTICIPATION LIMITATIONS: cleaning, laundry, driving, and shopping  PERSONAL FACTORS 3+ comorbidities:    are also affecting patient's functional outcome.   REHAB POTENTIAL: Good  CLINICAL DECISION MAKING: Evolving/moderate complexity multiple dislocations and decreased activity to prevent dislocations   EVALUATION COMPLEXITY: Moderate   GOALS: Goals reviewed with patient? Yes  SHORT TERM GOALS: Target date: 05/09/2022  Patient will increase hip flexion strength by 5 lbs on the right side  Baseline: Goal status: INITIAL  2.  Patient will be independent with base exercise program without dislocations  Baseline:  Goal status: INITIAL  3.  Patiet will flex hip to 110 without feeling of dislocation  Baseline:  Goal status: INITIAL  LONG TERM GOALS: Target date: 06/06/2022   Patient will have no incidence of dislocating  Baseline:  Goal status: INITIAL  2.  Patient will demonstrate optimal strength and stability prior to her surgery  Baseline:  Goal status: INITIAL   PLAN: PT FREQUENCY: 2x/week  PT DURATION: 8 weeks  PLANNED INTERVENTIONS: Therapeutic exercises, Therapeutic activity, Neuromuscular re-education, Balance training, Gait training, Patient/Family education, Self Care, Joint mobilization, Stair training, Aquatic  Therapy, Dry Needling, Cryotherapy, Moist heat, Taping, Ionotophoresis '4mg'$ /ml Dexamethasone, and Manual therapy  PLAN FOR NEXT SESSION: review light standing strengthening exercises; consider air-ex stability exercises; be careful not to flex hip too much. This appears to be her dislocation position. Patient wants to ride  the bike but we will start with low range nu-step first; keep strengthening exercises in low range.    Denton Meek, PT MPT 04/11/2022, 4:46 PM

## 2022-04-11 NOTE — Telephone Encounter (Signed)
LMTCB

## 2022-04-12 NOTE — Telephone Encounter (Signed)
Spoke with the pt and notified that there is no form that needs to be filled out  Her medication has a $40 copay  Nothing further needed

## 2022-04-13 ENCOUNTER — Encounter (HOSPITAL_BASED_OUTPATIENT_CLINIC_OR_DEPARTMENT_OTHER): Payer: Self-pay | Admitting: Physical Therapy

## 2022-04-13 ENCOUNTER — Ambulatory Visit (HOSPITAL_BASED_OUTPATIENT_CLINIC_OR_DEPARTMENT_OTHER): Payer: Medicare PPO | Admitting: Physical Therapy

## 2022-04-13 DIAGNOSIS — M25652 Stiffness of left hip, not elsewhere classified: Secondary | ICD-10-CM | POA: Diagnosis not present

## 2022-04-13 DIAGNOSIS — R2689 Other abnormalities of gait and mobility: Secondary | ICD-10-CM | POA: Diagnosis not present

## 2022-04-13 DIAGNOSIS — R279 Unspecified lack of coordination: Secondary | ICD-10-CM | POA: Diagnosis not present

## 2022-04-13 DIAGNOSIS — M6281 Muscle weakness (generalized): Secondary | ICD-10-CM

## 2022-04-13 DIAGNOSIS — M25651 Stiffness of right hip, not elsewhere classified: Secondary | ICD-10-CM | POA: Diagnosis not present

## 2022-04-13 NOTE — Therapy (Signed)
OUTPATIENT PHYSICAL THERAPY LOWER EXTREMITY    Patient Name: [name] MRN: [id number] DOB:25-May-1951, 71 y.o., female Today's Date: 04/13/2022   PT End of Session - 04/13/22 1535     Visit Number 4    Number of Visits 16    Date for PT Re-Evaluation 05/23/22    Authorization Type humana mcr    PT Start Time 1532    PT Stop Time 1612    PT Time Calculation (min) 40 min    Activity Tolerance Patient tolerated treatment well    Behavior During Therapy WFL for tasks assessed/performed               Past Medical History:  Diagnosis Date   Acid reflux    Anemia    "during the time I was taking meloxicam"   Arthritis    Asthma    LOV  6/13  Dr Annamaria Boots EPIC/ clearance on chart  from 10/12   Depression    Dysrhythmia    PVC's with caffeine and anxiety   Heart murmur    since childhood   Hiatal hernia    High cholesterol    Hypertension    OV with clearance and note Dr Addison Lank 6/13 chart   Osteoporosis    Sleep apnea    no CPAP/ last study 9 yrs ago- "mild per patient"   STD (sexually transmitted disease)    HSV2   Stye 06/2009   eye   Past Surgical History:  Procedure Laterality Date   bilateral hip replacement Bilateral 6/07, 9/07   right, then left   BREAST REDUCTION SURGERY     CESAREAN SECTION     FRACTURE SURGERY     Left leg-1977   NASAL SEPTOPLASTY W/ TURBINOPLASTY Bilateral 07/19/2018   Procedure: NASAL SEPTOPLASTY WITH TURBINATE REDUCTION;  Surgeon: Jerrell Belfast, MD;  Location: [facility];  Service: ENT;  Laterality: Bilateral;   NASAL SEPTUM SURGERY     REDUCTION MAMMAPLASTY     TOTAL KNEE ARTHROPLASTY  03/18/2012   Procedure: TOTAL KNEE ARTHROPLASTY;  Surgeon: Gearlean Alf, MD;  Location: [facility];  Service: Orthopedics;  Laterality: Left;   TOTAL KNEE ARTHROPLASTY Right 07/23/2017   Procedure: RIGHT TOTAL KNEE ARTHROPLASTY;  Surgeon: Gaynelle Arabian, MD;  Location: [facility];  Service: Orthopedics;  Laterality: Right;   Patient Active Problem  List   Diagnosis Date Noted   Postmenopausal 12/05/2020   Urge incontinence of urine 12/05/2020   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   Age-related osteoporosis without current pathological fracture 12/02/2020   Deviated septum 10/04/2016   Nasal turbinate hypertrophy 10/04/2016   Itchy eyes 09/13/2016   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 smear of cervix 01/24/2016   Postop Hyponatremia 03/20/2012   OA (osteoarthritis) of knee 03/18/2012   Sinusitis, chronic 11/25/2008   Obstructive sleep apnea 06/14/2007   HYPERTENSION 06/14/2007   Seasonal and perennial allergic rhinitis 06/14/2007   Asthma, moderate persistent 06/14/2007   GERD 06/14/2007    PCP: Baird Lyons MD   REFERRING PROVIDER: Dr Hector Shade   REFERRING DIAG: M25.551 (ICD-10-CM) - Pain in right hip ( no pain right now )  History of right hip dislocation   THERAPY DIAG:  Other abnormalities of gait and mobility  Stiffness of right hip, not elsewhere classified  Muscle weakness (generalized)  Rationale for Evaluation and Treatment Rehabilitation  ONSET DATE: Last dislocation was aprox.  April/May of 2023   SUBJECTIVE:    Current subjective: "I  felt it in my left hip after last treatment for maybe a day or so 3/10, no pain today" SUBJECTIVE STATEMENT: The patient has a history of two right hip dislocations. She was able to et her hip back into socket without going to the ED. She has not had a dislocation since April/ May. She is not having any pain at this time. She has reduced the amonut of activity that she has been able to do. She is concerned about continuing with her current exercises.   PERTINENT HISTORY: Bilateral knee replacement; bilateral hip replacement; osteoperosis, both hips have dislocated.   PAIN:  Are you having pain? Yes: Pain location: No  Pain description: aching when it does hurt  Aggravating factors: No pain at this time  Relieving factors:  rest   PRECAUTIONS: None  WEIGHT BEARING RESTRICTIONS No  FALLS:  Has patient fallen in last 6 months? 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[employer]  [employer]        PLOF: Independent  PATIENT GOALS     OBJECTIVE:   DIAGNOSTIC FINDINGS:   PATIENT SURVEYS:  FOTO    COGNITION:  Overall cognitive status: Within functional limits for tasks assessed     SENSATION: WFL   POSTURE: No Significant postural limitations  PALPATION: No unexpected tenderness to palpation  LOWER EXTREMITY ROM:  Passive ROM Right eval Left eval  Hip flexion Can get to 105 without any feeling like she is dislocating    Hip extension    Hip abduction    Hip adduction    Hip internal rotation Mild limitations without pain    Hip external rotation Mild limitations without pain    Knee flexion    Knee extension    Ankle dorsiflexion    Ankle plantarflexion    Ankle inversion    Ankle eversion     (Blank rows = not tested)  LOWER EXTREMITY MMT:  MMT Right eval Left eval  Hip flexion 9.8 18.7  Hip extension    Hip abduction 42.2 30.1   Hip adduction    Hip internal rotation    Hip external rotation    Knee flexion    Knee extension 30.8 26.1  Ankle dorsiflexion    Ankle plantarflexion    Ankle inversion    Ankle eversion     (Blank rows = not tested)  LOWER EXTREMITY SPECIAL TESTS:   GAIT: Mild lateral movement with standing and walking     TODAY'S TREATMENT:  Pt seen for aquatic therapy today.  Treatment took place in water 3.25-4.5 ft in depth at the [facility]. Temp of water was 91.  Pt entered/exited the pool via steps using step-to pattern with hand rail.  Walking forward/ backward without UE support Braiding L/ R  Holding yellow noodle: stork stance with opp leg clam x 10 each;   3 way leg kick x 10 each; trial 2 reps with yellow   Alternating toe taps to 1st/2nd step, slow and  controlled Step ups for forward R/L x10 with eccentric lowering;  Step downs/retro step up with R/L x 10 each; lateral step down x 8L, 3 L Seated on bench with feet on blue step, STS x 10 without UE support Holding wall: split squat x 10 each side Ab set with thin square noodle pull down x 12  Pt requires the buoyancy and hydrostatic pressure of water for support, and to offload joints by unweighting joint load  by at least 50 % in navel deep water and by at least 75-80% in chest to neck deep water.  Viscosity of the water is needed for resistance of strengthening. Water current perturbations provides challenge to standing balance requiring increased core activation.    PATIENT EDUCATION:  Education details: reviewed positions to avoid;  Person educated: Patient Education method: Explanation, Demonstration, Corporate treasurer cues, Verbal cues, and Handouts Education comprehension: verbalized understanding, returned demonstration, verbal cues required, tactile cues required, and needs further education   HOME EXERCISE PROGRAM: Access Code: [email or web address] URL: [email or web address] Date: 04/13/2022 Prepared by: [place]  Exercises - Supine Bridge  - 1 x daily - 7 x weekly - 3 sets - 10 reps - Standing Hip Extension with Counter Support  - 1 x daily - 7 x weekly - 3 sets - 10 reps - Standing Hip Abduction with Counter Support  - 1 x daily - 7 x weekly - 3 sets - 10 reps - Standing March with Counter Support  - 1 x daily - 7 x weekly - 3 sets - 10 reps  Aquatics HEP: - Standing 3-Way Kick with Ankle Float at [employer]  - 1 x daily - 2 x weekly - 1-2 sets - 10 reps - Forward Step Down  - 1 x daily - 2 x weekly - 1 sets - 10 reps - Step Up  - 1 x daily - 2 x weekly - 1 sets - 10 reps - Carioca with Hand Floats  - 1 x daily - 2 x weekly - 1 sets - Alternating Forward Lunge (split squat holding wall)  - 1 x daily - 2 x weekly - 10 reps - Side Stepping  - 1 x  daily -2 x weekly - 1 sets - 10 reps  ASSESSMENT:  CLINICAL IMPRESSION:  Pt will be having a procedure done on October 4th to secure her Rt hip.  She tolerated all exercises well without discomfort.  She was challenged with Rt SLS exercise, and also forward / retro step ups with LLE.  Will continue to add to aquatic HEP as sessions progress. She would benefit from skilled therapy to maximize strength and function prior to surgery to fixate her hip.  Goals are ongoing.  Pt mentioned her attendance may be limited due to her husband's recent cancer diagnosis.    OBJECTIVE IMPAIRMENTS Abnormal gait, decreased activity tolerance, difficulty walking, decreased ROM, decreased strength, and pain.   ACTIVITY LIMITATIONS lifting, bending, sitting, standing, and squatting  PARTICIPATION LIMITATIONS: cleaning, laundry, driving, and shopping  PERSONAL FACTORS 3+ comorbidities:    are also affecting patient's functional outcome.   REHAB POTENTIAL: Good  CLINICAL DECISION MAKING: Evolving/moderate complexity multiple dislocations and decreased activity to prevent dislocations   EVALUATION COMPLEXITY: Moderate   GOALS: Goals reviewed with patient? Yes  SHORT TERM GOALS: Target date: 05/11/2022  Patient will increase hip flexion strength by 5 lbs on the right side  Baseline: Goal status: INITIAL  2.  Patient will be independent with base exercise program without dislocations  Baseline:  Goal status: INITIAL  3.  Patiet will flex hip to 110 without feeling of dislocation  Baseline:  Goal status: INITIAL  LONG TERM GOALS: Target date: 06/08/2022   Patient will have no incidence of dislocating  Baseline:  Goal status: INITIAL  2.  Patient will demonstrate optimal strength and stability prior to her surgery  Baseline:  Goal status: INITIAL   PLAN: PT FREQUENCY: 2x/week  PT DURATION: 8 weeks  PLANNED INTERVENTIONS: Therapeutic exercises, Therapeutic activity, Neuromuscular re-education,  Balance training, Gait training, Patient/Family education, Self Care, Joint mobilization, Stair training, Aquatic Therapy, Dry Needling, Cryotherapy, Moist heat, Taping, Ionotophoresis '4mg'$ /ml Dexamethasone, and Manual therapy  PLAN FOR NEXT SESSION: review light standing strengthening exercises; consider air-ex stability exercises; be careful not to flex hip too much. This appears to be her dislocation position. Patient wants to ride the bike but we will start with low range nu-step first; keep strengthening exercises in low range.   Kerin Perna, PTA 04/13/22 4:20 PM

## 2022-04-17 DIAGNOSIS — Z6831 Body mass index (BMI) 31.0-31.9, adult: Secondary | ICD-10-CM | POA: Diagnosis not present

## 2022-04-17 DIAGNOSIS — R7303 Prediabetes: Secondary | ICD-10-CM | POA: Diagnosis not present

## 2022-04-17 DIAGNOSIS — G4733 Obstructive sleep apnea (adult) (pediatric): Secondary | ICD-10-CM | POA: Diagnosis not present

## 2022-04-17 DIAGNOSIS — E782 Mixed hyperlipidemia: Secondary | ICD-10-CM | POA: Diagnosis not present

## 2022-04-17 DIAGNOSIS — E669 Obesity, unspecified: Secondary | ICD-10-CM | POA: Diagnosis not present

## 2022-04-17 DIAGNOSIS — I10 Essential (primary) hypertension: Secondary | ICD-10-CM | POA: Diagnosis not present

## 2022-04-17 DIAGNOSIS — Z1331 Encounter for screening for depression: Secondary | ICD-10-CM | POA: Diagnosis not present

## 2022-04-19 ENCOUNTER — Encounter (HOSPITAL_BASED_OUTPATIENT_CLINIC_OR_DEPARTMENT_OTHER): Payer: Self-pay | Admitting: Physical Therapy

## 2022-04-19 ENCOUNTER — Ambulatory Visit (HOSPITAL_BASED_OUTPATIENT_CLINIC_OR_DEPARTMENT_OTHER): Payer: Medicare PPO | Admitting: Physical Therapy

## 2022-04-19 DIAGNOSIS — M25651 Stiffness of right hip, not elsewhere classified: Secondary | ICD-10-CM

## 2022-04-19 DIAGNOSIS — M25652 Stiffness of left hip, not elsewhere classified: Secondary | ICD-10-CM | POA: Diagnosis not present

## 2022-04-19 DIAGNOSIS — R279 Unspecified lack of coordination: Secondary | ICD-10-CM | POA: Diagnosis not present

## 2022-04-19 DIAGNOSIS — R2689 Other abnormalities of gait and mobility: Secondary | ICD-10-CM

## 2022-04-19 DIAGNOSIS — M6281 Muscle weakness (generalized): Secondary | ICD-10-CM | POA: Diagnosis not present

## 2022-04-19 NOTE — Therapy (Signed)
OUTPATIENT PHYSICAL THERAPY LOWER EXTREMITY    Patient Name: [name] MRN: [id number] DOB:1951/02/09, 71 y.o., female Today's Date: 04/19/2022   PT End of Session - 04/19/22 0810     Visit Number 5    Number of Visits 16    Date for PT Re-Evaluation 05/23/22    Authorization Type humana mcr    PT Start Time 0803    PT Stop Time 0845    PT Time Calculation (min) 42 min    Activity Tolerance Patient tolerated treatment well    Behavior During Therapy [facility] for tasks assessed/performed               Past Medical History:  Diagnosis Date   Acid reflux    Anemia    "during the time I was taking meloxicam"   Arthritis    Asthma    LOV  6/13  Dr Annamaria Boots EPIC/ clearance on chart  from 10/12   Depression    Dysrhythmia    PVC's with caffeine and anxiety   Heart murmur    since childhood   Hiatal hernia    High cholesterol    Hypertension    OV with clearance and note Dr Addison Lank 6/13 chart   Osteoporosis    Sleep apnea    no CPAP/ last study 9 yrs ago- "mild per patient"   STD (sexually transmitted disease)    HSV2   Stye 06/2009   eye   Past Surgical History:  Procedure Laterality Date   bilateral hip replacement Bilateral 6/07, 9/07   right, then left   [place]     Left leg-1977   NASAL SEPTOPLASTY W/ TURBINOPLASTY Bilateral 07/19/2018   Procedure: NASAL SEPTOPLASTY WITH TURBINATE REDUCTION;  Surgeon: Jerrell Belfast, MD;  Location: [place];  Service: ENT;  Laterality: Bilateral;   NASAL SEPTUM SURGERY     REDUCTION MAMMAPLASTY     TOTAL KNEE ARTHROPLASTY  03/18/2012   Procedure: TOTAL KNEE ARTHROPLASTY;  Surgeon: Gearlean Alf, MD;  Location: [facility];  Service: Orthopedics;  Laterality: Left;   TOTAL KNEE ARTHROPLASTY Right 07/23/2017   Procedure: RIGHT TOTAL KNEE ARTHROPLASTY;  Surgeon: Gaynelle Arabian, MD;  Location: [facility];  Service: Orthopedics;  Laterality: Right;   Patient Active Problem  List   Diagnosis Date Noted   Postmenopausal 12/05/2020   Urge incontinence of urine 12/05/2020   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   Age-related osteoporosis without current pathological fracture 12/02/2020   Deviated septum 10/04/2016   Nasal turbinate hypertrophy 10/04/2016   Itchy eyes 09/13/2016   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 smear of cervix 01/24/2016   Postop Hyponatremia 03/20/2012   OA (osteoarthritis) of knee 03/18/2012   Sinusitis, chronic 11/25/2008   Obstructive sleep apnea 06/14/2007   HYPERTENSION 06/14/2007   Seasonal and perennial allergic rhinitis 06/14/2007   Asthma, moderate persistent 06/14/2007   GERD 06/14/2007    PCP: Baird Lyons MD   REFERRING PROVIDER: Dr Hector Shade   REFERRING DIAG: M25.551 (ICD-10-CM) - Pain in right hip ( no pain right now )  History of right hip dislocation   THERAPY DIAG:  Other abnormalities of gait and mobility  Stiffness of right hip, not elsewhere classified  Muscle weakness (generalized)  Unspecified lack of coordination  Stiffness of left hip, not elsewhere classified  Rationale for Evaluation and Treatment Rehabilitation  ONSET DATE: Last dislocation was aprox.  April/May of 2023   SUBJECTIVE:  The patient feels like the water therapy has been going well. She has been coming to the gym.     SUBJECTIVE STATEMENT: The patient has a history of two right hip dislocations. She was able to et her hip back into socket without going to the ED. She has not had a dislocation since April/ May. She is not having any pain at this time. She has reduced the amonut of activity that she has been able to do. She is concerned about continuing with her current exercises.   PERTINENT HISTORY: Bilateral knee replacement; bilateral hip replacement; osteoperosis, both hips have dislocated.   PAIN:  Are you having pain? Yes: Pain location: No  Pain description: aching when it  does hurt  Aggravating factors: No pain at this time  Relieving factors: rest   PRECAUTIONS: None  WEIGHT BEARING RESTRICTIONS No  FALLS:  Has patient fallen in last 6 months? 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[employer]  [employer]        PLOF: Independent  PATIENT GOALS     OBJECTIVE:   DIAGNOSTIC FINDINGS:   PATIENT SURVEYS:  FOTO    COGNITION:  Overall cognitive status: Within functional limits for tasks assessed     SENSATION: WFL   POSTURE: No Significant postural limitations  PALPATION: No unexpected tenderness to palpation  LOWER EXTREMITY ROM:  Passive ROM Right eval Left eval  Hip flexion Can get to 105 without any feeling like she is dislocating    Hip extension    Hip abduction    Hip adduction    Hip internal rotation Mild limitations without pain    Hip external rotation Mild limitations without pain    Knee flexion    Knee extension    Ankle dorsiflexion    Ankle plantarflexion    Ankle inversion    Ankle eversion     (Blank rows = not tested)  LOWER EXTREMITY MMT:  MMT Right eval Left eval  Hip flexion 9.8 18.7  Hip extension    Hip abduction 42.2 30.1   Hip adduction    Hip internal rotation    Hip external rotation    Knee flexion    Knee extension 30.8 26.1  Ankle dorsiflexion    Ankle plantarflexion    Ankle inversion    Ankle eversion     (Blank rows = not tested)  LOWER EXTREMITY SPECIAL TESTS:   GAIT: Mild lateral movement with standing and walking     TODAY'S TREATMENT: 8/16  Nu-step 5 min L3 seat 8: reviewed self set up in the gym on both machines  Cable: Chop 2x10 10 lbs  Pallof press 2x10 10 lbs   Hip abdcution 2x10 20 lbs Knee extension machine 2x10    Patient wanted to do the leg press but she was advised it may not be the safest with her hip dislocating in flexion.     Last visit   Pt seen for aquatic therapy today.  Treatment took  place in water 3.25-4.5 ft in depth at the [facility]. Temp of water was 91.  Pt entered/exited the pool via steps using step-to pattern with hand rail.  Walking forward/ backward without UE support Braiding L/ R  Holding yellow noodle: stork stance with opp leg clam x 10 each;   3 way leg kick x 10 each; trial 2 reps with yellow   Alternating toe taps  to 1st/2nd step, slow and controlled Step ups for forward R/L x10 with eccentric lowering;  Step downs/retro step up with R/L x 10 each; lateral step down x 8L, 3 L Seated on bench with feet on blue step, STS x 10 without UE support Holding wall: split squat x 10 each side Ab set with thin square noodle pull down x 12  Pt requires the buoyancy and hydrostatic pressure of water for support, and to offload joints by unweighting joint load by at least 50 % in navel deep water and by at least 75-80% in chest to neck deep water.  Viscosity of the water is needed for resistance of strengthening. Water current perturbations provides challenge to standing balance requiring increased core activation.    PATIENT EDUCATION:  Education details: reviewed positions to avoid;  Person educated: Patient Education method: Explanation, Demonstration, Corporate treasurer cues, Verbal cues, and Handouts Education comprehension: verbalized understanding, returned demonstration, verbal cues required, tactile cues required, and needs further education   HOME EXERCISE PROGRAM: Access Code: [email or web address] URL: [email or web address] Date: 04/13/2022 Prepared by: [place]  Exercises - Supine Bridge  - 1 x daily - 7 x weekly - 3 sets - 10 reps - Standing Hip Extension with Counter Support  - 1 x daily - 7 x weekly - 3 sets - 10 reps - Standing Hip Abduction with Counter Support  - 1 x daily - 7 x weekly - 3 sets - 10 reps - Standing March with Counter Support  - 1 x daily - 7 x weekly - 3 sets - 10 reps  Aquatics  HEP: - Standing 3-Way Kick with Ankle Float at [employer]  - 1 x daily - 2 x weekly - 1-2 sets - 10 reps - Forward Step Down  - 1 x daily - 2 x weekly - 1 sets - 10 reps - Step Up  - 1 x daily - 2 x weekly - 1 sets - 10 reps - [place] with Hand Floats  - 1 x daily - 2 x weekly - 1 sets - Alternating Forward Lunge (split squat holding wall)  - 1 x daily - 2 x weekly - 10 reps - Side Stepping  - 1 x daily -2 x weekly - 1 sets - 10 reps  ASSESSMENT:  CLINICAL IMPRESSION:  The patient tolerated treatment well. We reviewed gym exercises that she should be able to do without bothering her hip. She is already doing the hip abduction machine. She tolerated well. She had no increase in pain. We gave her an updated program. She is supposed to be given an laminated pool program. Therapy will continue to progress as tolerated.  OBJECTIVE IMPAIRMENTS Abnormal gait, decreased activity tolerance, difficulty walking, decreased ROM, decreased strength, and pain.   ACTIVITY LIMITATIONS lifting, bending, sitting, standing, and squatting  PARTICIPATION LIMITATIONS: cleaning, laundry, driving, and shopping  PERSONAL FACTORS 3+ comorbidities:    are also affecting patient's functional outcome.   REHAB POTENTIAL: Good  CLINICAL DECISION MAKING: Evolving/moderate complexity multiple dislocations and decreased activity to prevent dislocations   EVALUATION COMPLEXITY: Moderate   GOALS: Goals reviewed with patient? Yes  SHORT TERM GOALS: Target date: 05/17/2022  Patient will increase hip flexion strength by 5 lbs on the right side  Baseline: Goal status: INITIAL  2.  Patient will be independent with base exercise program without dislocations  Baseline:  Goal status: INITIAL  3.  Patiet will flex hip to 110 without feeling of dislocation  Baseline:  Goal status: INITIAL  LONG TERM GOALS: Target date: 06/14/2022   Patient will have no incidence of dislocating  Baseline:  Goal status: INITIAL  2.   Patient will demonstrate optimal strength and stability prior to her surgery  Baseline:  Goal status: INITIAL   PLAN: PT FREQUENCY: 2x/week  PT DURATION: 8 weeks  PLANNED INTERVENTIONS: Therapeutic exercises, Therapeutic activity, Neuromuscular re-education, Balance training, Gait training, Patient/Family education, Self Care, Joint mobilization, Stair training, Aquatic Therapy, Dry Needling, Cryotherapy, Moist heat, Taping, Ionotophoresis '4mg'$ /ml Dexamethasone, and Manual therapy  PLAN FOR NEXT SESSION: review light standing strengthening exercises; consider air-ex stability exercises; be careful not to flex hip too much. This appears to be her dislocation position. Patient wants to ride the bike but we will start with low range nu-step first; keep strengthening exercises in low range.   Carolyne Littles PT DPT  04/19/22 8:39 AM

## 2022-04-21 ENCOUNTER — Encounter (HOSPITAL_BASED_OUTPATIENT_CLINIC_OR_DEPARTMENT_OTHER): Payer: Self-pay | Admitting: Physical Therapy

## 2022-04-21 ENCOUNTER — Ambulatory Visit (HOSPITAL_BASED_OUTPATIENT_CLINIC_OR_DEPARTMENT_OTHER): Payer: Medicare PPO | Admitting: Physical Therapy

## 2022-04-21 DIAGNOSIS — R2689 Other abnormalities of gait and mobility: Secondary | ICD-10-CM | POA: Diagnosis not present

## 2022-04-21 DIAGNOSIS — M25651 Stiffness of right hip, not elsewhere classified: Secondary | ICD-10-CM

## 2022-04-21 DIAGNOSIS — M6281 Muscle weakness (generalized): Secondary | ICD-10-CM

## 2022-04-21 DIAGNOSIS — M25652 Stiffness of left hip, not elsewhere classified: Secondary | ICD-10-CM | POA: Diagnosis not present

## 2022-04-21 DIAGNOSIS — R279 Unspecified lack of coordination: Secondary | ICD-10-CM | POA: Diagnosis not present

## 2022-04-21 NOTE — Therapy (Signed)
OUTPATIENT PHYSICAL THERAPY LOWER EXTREMITY    Patient Name: [name] MRN: [id number] DOB:[birth year], 71 y.o., female Today's Date: 04/21/2022   PT End of Session - 04/21/22 0827     Visit Number 6    Number of Visits 16    Date for PT Re-Evaluation 05/23/22    Authorization Type humana mcr    PT Start Time 0815    PT Stop Time 0858    PT Time Calculation (min) 43 min    Activity Tolerance Patient tolerated treatment well    Behavior During Therapy [facility] for tasks assessed/performed               Past Medical History:  Diagnosis Date   Acid reflux    Anemia    "during the time I was taking meloxicam"   Arthritis    Asthma    LOV  6/13  Dr Annamaria Boots EPIC/ clearance on chart  from 10/12   Depression    Dysrhythmia    PVC's with caffeine and anxiety   Heart murmur    since childhood   Hiatal hernia    High cholesterol    Hypertension    OV with clearance and note Dr Addison Lank 6/13 chart   Osteoporosis    Sleep apnea    no CPAP/ last study 9 yrs ago- "mild per patient"   STD (sexually transmitted disease)    HSV2   Stye 06/2009   eye   Past Surgical History:  Procedure Laterality Date   bilateral hip replacement Bilateral 6/07, 9/07   right, then left   [place]     Left leg-1977   NASAL SEPTOPLASTY W/ TURBINOPLASTY Bilateral 07/19/2018   Procedure: NASAL SEPTOPLASTY WITH TURBINATE REDUCTION;  Surgeon: Jerrell Belfast, MD;  Location: [place];  Service: ENT;  Laterality: Bilateral;   NASAL SEPTUM SURGERY     REDUCTION MAMMAPLASTY     TOTAL KNEE ARTHROPLASTY  03/18/2012   Procedure: TOTAL KNEE ARTHROPLASTY;  Surgeon: Gearlean Alf, MD;  Location: [facility];  Service: Orthopedics;  Laterality: Left;   TOTAL KNEE ARTHROPLASTY Right 07/23/2017   Procedure: RIGHT TOTAL KNEE ARTHROPLASTY;  Surgeon: Gaynelle Arabian, MD;  Location: [facility];  Service: Orthopedics;  Laterality: Right;   Patient Active Problem  List   Diagnosis Date Noted   Postmenopausal 12/05/2020   Urge incontinence of urine 12/05/2020   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   Age-related osteoporosis without current pathological fracture 12/02/2020   Deviated septum 10/04/2016   Nasal turbinate hypertrophy 10/04/2016   Itchy eyes 09/13/2016   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 smear of cervix 01/24/2016   Postop Hyponatremia 03/20/2012   OA (osteoarthritis) of knee 03/18/2012   Sinusitis, chronic 11/25/2008   Obstructive sleep apnea 06/14/2007   HYPERTENSION 06/14/2007   Seasonal and perennial allergic rhinitis 06/14/2007   Asthma, moderate persistent 06/14/2007   GERD 06/14/2007    PCP: Baird Lyons MD   REFERRING PROVIDER: Dr Hector Shade   REFERRING DIAG: M25.551 (ICD-10-CM) - Pain in right hip ( no pain right now )  History of right hip dislocation   THERAPY DIAG:  Other abnormalities of gait and mobility  Stiffness of right hip, not elsewhere classified  Muscle weakness (generalized)  Rationale for Evaluation and Treatment Rehabilitation  ONSET DATE: Last dislocation was aprox.  April/May of 2023   SUBJECTIVE:   Pt reports that her  husband has his surgery scheduled for the 28th.  She reports she has a lot on her plate so she is not sure if she needs any more aquatic sessions.  She feels she may have most the exercises she needs to continue strengthening until her surgery.     SUBJECTIVE STATEMENT (EVAL): The patient has a history of two right hip dislocations. She was able to et her hip back into socket without going to the ED. She has not had a dislocation since April/ May. She is not having any pain at this time. She has reduced the amonut of activity that she has been able to do. She is concerned about continuing with her current exercises.   PERTINENT HISTORY: Bilateral knee replacement; bilateral hip replacement; osteoperosis, both hips have  dislocated.   PAIN:  Are you having pain? Yes: Pain location: No  Pain description: aching when it does hurt  Aggravating factors: No pain at this time  Relieving factors: rest   PRECAUTIONS: None  WEIGHT BEARING RESTRICTIONS No  FALLS:  Has patient fallen in last 6 months? 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[employer]  [employer]        PLOF: Independent  PATIENT GOALS     OBJECTIVE:   DIAGNOSTIC FINDINGS:   PATIENT SURVEYS:  FOTO    COGNITION:  Overall cognitive status: Within functional limits for tasks assessed     SENSATION: WFL   POSTURE: No Significant postural limitations  PALPATION: No unexpected tenderness to palpation  LOWER EXTREMITY ROM:  Passive ROM Right eval Left eval  Hip flexion Can get to 105 without any feeling like she is dislocating    Hip extension    Hip abduction    Hip adduction    Hip internal rotation Mild limitations without pain    Hip external rotation Mild limitations without pain    Knee flexion    Knee extension    Ankle dorsiflexion    Ankle plantarflexion    Ankle inversion    Ankle eversion     (Blank rows = not tested)  LOWER EXTREMITY MMT:  MMT Right eval Left eval  Hip flexion 9.8 18.7  Hip extension    Hip abduction 42.2 30.1   Hip adduction    Hip internal rotation    Hip external rotation    Knee flexion    Knee extension 30.8 26.1  Ankle dorsiflexion    Ankle plantarflexion    Ankle inversion    Ankle eversion     (Blank rows = not tested)  LOWER EXTREMITY SPECIAL TESTS:   GAIT: Mild lateral movement with standing and walking     TODAY'S TREATMENT: 8/18  Pt seen for aquatic therapy today.  Treatment took place in water 3.25-4.5 ft in depth at the [facility]. Temp of water was 91.  Pt entered/exited the pool via steps using step-to pattern with hand rail, independently.  reviewed exercises to avoid as discusses last  session Walking forward/ backward without UE support Side stepping L/R STS from bench, x 20, slow and controlled Holding yellow noodle:  stork stance SLS x 30s each (limiting hip flex),  then with opp leg clam Holding yellow hand buoys- side stepping with arm abdct/ add; braiding x1 lap (stopped and switched); tandem gait forward/backward (cues to slow speed); SLS and 3 way toe tap x 5 each; SLS with 3 way leg kick Ab set with thin square noodle pull down  x 12 Step downs/retro step up with R/L x 10 each Lateral step upx 5 each   Pt requires the buoyancy and hydrostatic pressure of water for support, and to offload joints by unweighting joint load by at least 50 % in navel deep water and by at least 75-80% in chest to neck deep water.  Viscosity of the water is needed for resistance of strengthening. Water current perturbations provides challenge to standing balance requiring increased core activation.  8/16  Nu-step 5 min L3 seat 8: reviewed self set up in the gym on both machines  Cable: Chop 2x10 10 lbs  Pallof press 2x10 10 lbs   Hip abdcution 2x10 20 lbs Knee extension machine 2x10    Patient wanted to do the leg press but she was advised it may not be the safest with her hip dislocating in flexion.    PATIENT EDUCATION:  Education details: reviewed positions to avoid;  Person educated: Patient Education method: Explanation, Demonstration, Corporate treasurer cues, Verbal cues, and Handouts Education comprehension: verbalized understanding, returned demonstration, verbal cues required, tactile cues required, and needs further education   HOME EXERCISE PROGRAM: Access Code: [email or web address] URL: [email or web address] Date: 04/21/2022 Prepared by: [place]  Exercises: Land-based - Supine Bridge  - 1 x daily - 7 x weekly - 3 sets - 10 reps - Standing Hip Extension with Counter Support  - 1 x daily - 7 x weekly - 3 sets - 10 reps - Standing Hip  Abduction with Counter Support  - 1 x daily - 7 x weekly - 3 sets - 10 reps - Standing March with Counter Support  - 1 x daily - 7 x weekly - 3 sets - 10 reps - Forward Step Down  - 1 x daily - 2 x weekly - 1 sets - 10 reps - Step Up  - 1 x daily - 2 x weekly - 1 sets - 10 reps - Knee Extension with Weight Machine  - 1 x daily - 7 x weekly - 3 sets - 10 reps - Squatting Anti-Rotation Press  - 1 x daily - 7 x weekly - 3 sets - 10 reps - Standing Cable Chops  - 1 x daily - 7 x weekly - 3 sets - 10 reps  Aquatic  - Side Stepping with Hand Floats  - 1 x daily - 2 x weekly - 3 sets - 1 reps - Standing 3-Way Leg Reach  - 1 x daily - 7 x weekly - 3 sets - 10 reps - Standing 3-Way Kick with Ankle Float at [employer]  - 1 x daily - 7 x weekly - 1 sets - 10 reps - Heel Toe Raises at [place]  - 1 x daily - 2 x weekly - 2 sets - 10 reps - Squat  - 1 x daily - 7 x weekly - 2 sets - 10 reps - Forward Backward Tandem Walking with Hand Floats  - 1 x daily - 2 x weekly - 3 reps - Shoulder Flexion Extension with Pool Noodle (ab set, pulling noodle to thighs) - 1 x daily - 7 x weekly - 2 sets - 10 reps - Backward Step Up  - 1 x daily - 7 x weekly - 2 sets - 10 reps - Lateral Step Up  - 1 x daily - 7 x weekly - 2 sets - 10 reps   ASSESSMENT:  CLINICAL IMPRESSION:  The patient tolerated treatment well, with  no increase in pain. Created laminated aquatic HEP after session; will modify it in upcoming session as needed.  At end of session, pt verbalized desire to attend remaining sessions to get feedback and more ideas of water/land based exercise.  Therapy will continue to progress as tolerated.  Pt progressing towards all established therapy goals.    OBJECTIVE IMPAIRMENTS Abnormal gait, decreased activity tolerance, difficulty walking, decreased ROM, decreased strength, and pain.   ACTIVITY LIMITATIONS lifting, bending, sitting, standing, and squatting  PARTICIPATION LIMITATIONS: cleaning, laundry,  driving, and shopping  PERSONAL FACTORS 3+ comorbidities:    are also affecting patient's functional outcome.   REHAB POTENTIAL: Good  CLINICAL DECISION MAKING: Evolving/moderate complexity multiple dislocations and decreased activity to prevent dislocations   EVALUATION COMPLEXITY: Moderate   GOALS: Goals reviewed with patient? Yes  SHORT TERM GOALS: Target date: 05/19/2022  Patient will increase hip flexion strength by 5 lbs on the right side  Baseline: Goal status: INITIAL  2.  Patient will be independent with base exercise program without dislocations  Baseline:  Goal status: INITIAL  3.  Patiet will flex hip to 110 without feeling of dislocation  Baseline:  Goal status: INITIAL  LONG TERM GOALS: Target date: 06/16/2022   Patient will have no incidence of dislocating  Baseline:  Goal status: INITIAL  2.  Patient will demonstrate optimal strength and stability prior to her surgery  Baseline:  Goal status: INITIAL   PLAN: PT FREQUENCY: 2x/week  PT DURATION: 8 weeks  PLANNED INTERVENTIONS: Therapeutic exercises, Therapeutic activity, Neuromuscular re-education, Balance training, Gait training, Patient/Family education, Self Care, Joint mobilization, Stair training, Aquatic Therapy, Dry Needling, Cryotherapy, Moist heat, Taping, Ionotophoresis '4mg'$ /ml Dexamethasone, and Manual therapy  PLAN FOR NEXT SESSION: review light standing strengthening exercises; consider air-ex stability exercises; be careful not to flex hip too much. This appears to be her dislocation position. Patient wants to ride the bike but we will start with low range nu-step first; keep strengthening exercises in low range.     * pt wants to be shown Lat pull down at next land visit; finalize land HEP.   Kerin Perna, PTA 04/21/22 1:17 PM

## 2022-04-25 ENCOUNTER — Encounter (HOSPITAL_BASED_OUTPATIENT_CLINIC_OR_DEPARTMENT_OTHER): Payer: Self-pay | Admitting: Physical Therapy

## 2022-04-25 ENCOUNTER — Ambulatory Visit (HOSPITAL_BASED_OUTPATIENT_CLINIC_OR_DEPARTMENT_OTHER): Payer: Medicare PPO | Admitting: Physical Therapy

## 2022-04-25 DIAGNOSIS — M25651 Stiffness of right hip, not elsewhere classified: Secondary | ICD-10-CM

## 2022-04-25 DIAGNOSIS — M25652 Stiffness of left hip, not elsewhere classified: Secondary | ICD-10-CM | POA: Diagnosis not present

## 2022-04-25 DIAGNOSIS — R2689 Other abnormalities of gait and mobility: Secondary | ICD-10-CM

## 2022-04-25 DIAGNOSIS — M6281 Muscle weakness (generalized): Secondary | ICD-10-CM | POA: Diagnosis not present

## 2022-04-25 DIAGNOSIS — R279 Unspecified lack of coordination: Secondary | ICD-10-CM | POA: Diagnosis not present

## 2022-04-25 NOTE — Therapy (Signed)
OUTPATIENT PHYSICAL THERAPY LOWER EXTREMITY    Patient Name: [name] MRN: [id number] DOB:1951/09/03, 71 y.o., female Today's Date: 04/25/2022   PT End of Session - 04/25/22 1238     Visit Number 7    Number of Visits 16    Date for PT Re-Evaluation 05/23/22    Authorization Type [employer] mcr    PT Start Time 0930    PT Stop Time 1012    PT Time Calculation (min) 42 min    Activity Tolerance Patient tolerated treatment well    Behavior During Therapy WFL for tasks assessed/performed               Past Medical History:  Diagnosis Date   Acid reflux    Anemia    "during the time I was taking meloxicam"   Arthritis    Asthma    LOV  6/13  Dr Annamaria Boots EPIC/ clearance on chart  from 10/12   Depression    Dysrhythmia    PVC's with caffeine and anxiety   Heart murmur    since childhood   Hiatal hernia    High cholesterol    Hypertension    OV with clearance and note Dr Addison Lank 6/13 chart   Osteoporosis    Sleep apnea    no CPAP/ last study 9 yrs ago- "mild per patient"   STD (sexually transmitted disease)    HSV2   Stye 06/2009   eye   Past Surgical History:  Procedure Laterality Date   bilateral hip replacement Bilateral 6/07, 9/07   right, then left   BREAST REDUCTION SURGERY     CESAREAN SECTION     FRACTURE SURGERY     Left leg-1977   NASAL SEPTOPLASTY W/ TURBINOPLASTY Bilateral 07/19/2018   Procedure: NASAL SEPTOPLASTY WITH TURBINATE REDUCTION;  Surgeon: Jerrell Belfast, MD;  Location: [facility];  Service: ENT;  Laterality: Bilateral;   NASAL SEPTUM SURGERY     REDUCTION MAMMAPLASTY     TOTAL KNEE ARTHROPLASTY  03/18/2012   Procedure: TOTAL KNEE ARTHROPLASTY;  Surgeon: Gearlean Alf, MD;  Location: [facility];  Service: Orthopedics;  Laterality: Left;   TOTAL KNEE ARTHROPLASTY Right 07/23/2017   Procedure: RIGHT TOTAL KNEE ARTHROPLASTY;  Surgeon: Gaynelle Arabian, MD;  Location: [facility];  Service: Orthopedics;  Laterality: Right;   Patient Active Problem  List   Diagnosis Date Noted   Postmenopausal 12/05/2020   Urge incontinence of urine 12/05/2020   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   Age-related osteoporosis without current pathological fracture 12/02/2020   Deviated septum 10/04/2016   Nasal turbinate hypertrophy 10/04/2016   Itchy eyes 09/13/2016   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 smear of cervix 01/24/2016   Postop Hyponatremia 03/20/2012   OA (osteoarthritis) of knee 03/18/2012   Sinusitis, chronic 11/25/2008   Obstructive sleep apnea 06/14/2007   HYPERTENSION 06/14/2007   Seasonal and perennial allergic rhinitis 06/14/2007   Asthma, moderate persistent 06/14/2007   GERD 06/14/2007    PCP: Baird Lyons MD   REFERRING PROVIDER: Dr Hector Shade   REFERRING DIAG: M25.551 (ICD-10-CM) - Pain in right hip ( no pain right now )  History of right hip dislocation   THERAPY DIAG:  Other abnormalities of gait and mobility  Stiffness of right hip, not elsewhere classified  Muscle weakness (generalized)  Rationale for Evaluation and Treatment Rehabilitation  ONSET DATE: Last dislocation was aprox.  April/May of 2023   SUBJECTIVE:   Pt reports that her  husband has his surgery scheduled for the 28th.  She reports she has a lot on her plate so she is not sure if she needs any more aquatic sessions.  She feels she may have most the exercises she needs to continue strengthening until her surgery.     SUBJECTIVE STATEMENT (EVAL): The patient has a history of two right hip dislocations. She was able to et her hip back into socket without going to the ED. She has not had a dislocation since April/ May. She is not having any pain at this time. She has reduced the amonut of activity that she has been able to do. She is concerned about continuing with her current exercises.   PERTINENT HISTORY: Bilateral knee replacement; bilateral hip replacement; osteoperosis, both hips have  dislocated.   PAIN:  Are you having pain? Yes: Pain location: No  Pain description: aching when it does hurt  Aggravating factors: No pain at this time  Relieving factors: rest   PRECAUTIONS: None  WEIGHT BEARING RESTRICTIONS No  FALLS:  Has patient fallen in last 6 months? 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bridge  Reading        PLOF: Independent  PATIENT GOALS     OBJECTIVE:   DIAGNOSTIC FINDINGS:   PATIENT SURVEYS:  FOTO    COGNITION:  Overall cognitive status: Within functional limits for tasks assessed     SENSATION: WFL   POSTURE: No Significant postural limitations  PALPATION: No unexpected tenderness to palpation  LOWER EXTREMITY ROM:  Passive ROM Right eval Left eval  Hip flexion Can get to 105 without any feeling like she is dislocating    Hip extension    Hip abduction    Hip adduction    Hip internal rotation Mild limitations without pain    Hip external rotation Mild limitations without pain    Knee flexion    Knee extension    Ankle dorsiflexion    Ankle plantarflexion    Ankle inversion    Ankle eversion     (Blank rows = not tested)  LOWER EXTREMITY MMT:  MMT Right eval Left eval  Hip flexion 9.8 18.7  Hip extension    Hip abduction 42.2 30.1   Hip adduction    Hip internal rotation    Hip external rotation    Knee flexion    Knee extension 30.8 26.1  Ankle dorsiflexion    Ankle plantarflexion    Ankle inversion    Ankle eversion     (Blank rows = not tested)  LOWER EXTREMITY SPECIAL TESTS:   GAIT: Mild lateral movement with standing and walking     TODAY'S TREATMENT: 8/22 Nu-step  Seat 9 Level 3 5 min   Cable chop 5 lbs  Eye level 2x10   Pallof Press 5lbs 2x10   Life fitness 12 25 lbs Seat 4 2x15   Hip abduction Number 6  55 lbs 2x15  8/18  Pt seen for aquatic therapy today.  Treatment took place in water 3.25-4.5 ft in depth at the  [facility]. Temp of water was 91.  Pt entered/exited the pool via steps using step-to pattern with hand rail, independently.  reviewed exercises to avoid as discusses last session Walking forward/ backward without UE support Side stepping L/R STS from bench, x 20, slow and controlled Holding yellow noodle:  stork stance SLS x 30s each (limiting hip flex),  then with opp leg clam  Holding yellow hand buoys- side stepping with arm abdct/ add; braiding x1 lap (stopped and switched); tandem gait forward/backward (cues to slow speed); SLS and 3 way toe tap x 5 each; SLS with 3 way leg kick Ab set with thin square noodle pull down x 12 Step downs/retro step up with R/L x 10 each Lateral step upx 5 each   Pt requires the buoyancy and hydrostatic pressure of water for support, and to offload joints by unweighting joint load by at least 50 % in navel deep water and by at least 75-80% in chest to neck deep water.  Viscosity of the water is needed for resistance of strengthening. Water current perturbations provides challenge to standing balance requiring increased core activation.  8/16  Nu-step 5 min L3 seat 8: reviewed self set up in the gym on both machines  Cable: Chop 2x10 10 lbs  Pallof press 2x10 10 lbs   Hip abdcution 2x10 20 lbs Knee extension machine 2x10    Patient wanted to do the leg press but she was advised it may not be the safest with her hip dislocating in flexion.    PATIENT EDUCATION:  Education details: reviewed positions to avoid;  Person educated: Patient Education method: Explanation, Demonstration, Corporate treasurer cues, Verbal cues, and Handouts Education comprehension: verbalized understanding, returned demonstration, verbal cues required, tactile cues required, and needs further education   HOME EXERCISE PROGRAM: Access Code: [email or web address] URL: [email or web address] Date: 04/21/2022 Prepared by: [place]  Exercises: Land-based - Supine Bridge  - 1 x daily - 7 x weekly - 3 sets - 10 reps - Standing Hip Extension with Counter Support  - 1 x daily - 7 x weekly - 3 sets - 10 reps - Standing Hip Abduction with Counter Support  - 1 x daily - 7 x weekly - 3 sets - 10 reps - Standing March with Counter Support  - 1 x daily - 7 x weekly - 3 sets - 10 reps - Forward Step Down  - 1 x daily - 2 x weekly - 1 sets - 10 reps - Step Up  - 1 x daily - 2 x weekly - 1 sets - 10 reps - Knee Extension with Weight Machine  - 1 x daily - 7 x weekly - 3 sets - 10 reps - Squatting Anti-Rotation Press  - 1 x daily - 7 x weekly - 3 sets - 10 reps - Standing Cable Chops  - 1 x daily - 7 x weekly - 3 sets - 10 reps  Aquatic  - Side Stepping with Hand Floats  - 1 x daily - 2 x weekly - 3 sets - 1 reps - Standing 3-Way Leg Reach  - 1 x daily - 7 x weekly - 3 sets - 10 reps - Standing 3-Way Kick with Ankle Float at [employer]  - 1 x daily - 7 x weekly - 1 sets - 10 reps - Heel Toe Raises at [place]  - 1 x daily - 2 x weekly - 2 sets - 10 reps - Squat  - 1 x daily - 7 x weekly - 2 sets - 10 reps - Forward Backward Tandem Walking with Hand Floats  - 1 x daily - 2 x weekly - 3 reps - Shoulder Flexion Extension with Pool Noodle (ab set, pulling noodle to thighs) - 1 x daily - 7 x weekly - 2 sets - 10 reps - Backward Step Up  -  1 x daily - 7 x weekly - 2 sets - 10 reps - Lateral Step Up  - 1 x daily - 7 x weekly - 2 sets - 10 reps   ASSESSMENT:  CLINICAL IMPRESSION: The patient continues to make good progress. We reviewed more gym activity and answered a lot of the patients questions about activity progression and technique with there-ex. We reviewed different gym equipment and set up. She has 2 more pool treatments. She may then do a follow to see how her gym program is going. She had no pain and no signs of dislocation.   OBJECTIVE IMPAIRMENTS Abnormal gait, decreased activity tolerance, difficulty walking,  decreased ROM, decreased strength, and pain.   ACTIVITY LIMITATIONS lifting, bending, sitting, standing, and squatting  PARTICIPATION LIMITATIONS: cleaning, laundry, driving, and shopping  PERSONAL FACTORS 3+ comorbidities:    are also affecting patient's functional outcome.   REHAB POTENTIAL: Good  CLINICAL DECISION MAKING: Evolving/moderate complexity multiple dislocations and decreased activity to prevent dislocations   EVALUATION COMPLEXITY: Moderate   GOALS: Goals reviewed with patient? Yes  SHORT TERM GOALS: Target date: 05/23/2022  Patient will increase hip flexion strength by 5 lbs on the right side  Baseline: Goal status: INITIAL  2.  Patient will be independent with base exercise program without dislocations  Baseline:  Goal status: INITIAL  3.  Patiet will flex hip to 110 without feeling of dislocation  Baseline:  Goal status: INITIAL  LONG TERM GOALS: Target date: 06/20/2022   Patient will have no incidence of dislocating  Baseline:  Goal status: INITIAL  2.  Patient will demonstrate optimal strength and stability prior to her surgery  Baseline:  Goal status: INITIAL   PLAN: PT FREQUENCY: 2x/week  PT DURATION: 8 weeks  PLANNED INTERVENTIONS: Therapeutic exercises, Therapeutic activity, Neuromuscular re-education, Balance training, Gait training, Patient/Family education, Self Care, Joint mobilization, Stair training, Aquatic Therapy, Dry Needling, Cryotherapy, Moist heat, Taping, Ionotophoresis '4mg'$ /ml Dexamethasone, and Manual therapy  PLAN FOR NEXT SESSION: review light standing strengthening exercises; consider air-ex stability exercises; be careful not to flex hip too much. This appears to be her dislocation position. Patient wants to ride the bike but we will start with low range nu-step first; keep strengthening exercises in low range.     * pt wants to be shown Lat pull down at next land visit; finalize land HEP.   Carolyne Littles PT DPT  04/25/22  12:42 PM

## 2022-04-28 ENCOUNTER — Ambulatory Visit (HOSPITAL_BASED_OUTPATIENT_CLINIC_OR_DEPARTMENT_OTHER): Payer: Medicare PPO | Admitting: Physical Therapy

## 2022-04-28 ENCOUNTER — Encounter (HOSPITAL_BASED_OUTPATIENT_CLINIC_OR_DEPARTMENT_OTHER): Payer: Self-pay | Admitting: Physical Therapy

## 2022-04-28 DIAGNOSIS — R2689 Other abnormalities of gait and mobility: Secondary | ICD-10-CM | POA: Diagnosis not present

## 2022-04-28 DIAGNOSIS — R279 Unspecified lack of coordination: Secondary | ICD-10-CM | POA: Diagnosis not present

## 2022-04-28 DIAGNOSIS — M25651 Stiffness of right hip, not elsewhere classified: Secondary | ICD-10-CM | POA: Diagnosis not present

## 2022-04-28 DIAGNOSIS — M25652 Stiffness of left hip, not elsewhere classified: Secondary | ICD-10-CM | POA: Diagnosis not present

## 2022-04-28 DIAGNOSIS — M6281 Muscle weakness (generalized): Secondary | ICD-10-CM | POA: Diagnosis not present

## 2022-04-28 NOTE — Therapy (Signed)
OUTPATIENT PHYSICAL THERAPY LOWER EXTREMITY    Patient Name: [name] MRN: [id number] DOB:02/06/1951, 71 y.o., female Today's Date: 04/28/2022   PT End of Session - 04/28/22 0901     Visit Number 8    Number of Visits 16    Date for PT Re-Evaluation 05/23/22    Authorization Type [employer] M[employer]    PT Start Time 0905    PT Stop Time 0943    PT Time Calculation (min) 38 min    Activity Tolerance Patient tolerated treatment well    Behavior During Therapy WFL for tasks assessed/performed               Past Medical History:  Diagnosis Date   Acid reflux    Anemia    "during the time I was taking meloxicam"   Arthritis    Asthma    LOV  6/13  Dr Young EPIC/ clearance on chart  from 10/12   Depression    Dysrhythmia    PVC's with caffeine and anxiety   Heart murmur    since childhood   Hiatal hernia    High cholesterol    Hypertension    OV with clearance and note Dr Mcneill 6/13 chart   Osteoporosis    Sleep apnea    no CPAP/ last study 9 yrs ago- "mild per patient"   STD (sexually transmitted disease)    HSV2   Stye 06/2009   eye   Past Surgical History:  Procedure Laterality Date   bilateral hip replacement Bilateral 6/07, 9/07   right, then left   BREAST REDUCTION SURGERY     CESAREAN SECTION     FRACTURE SURGERY     Left leg-1977   NASAL SEPTOPLASTY W/ TURBINOPLASTY Bilateral 07/19/2018   Procedure: NASAL SEPTOPLASTY WITH TURBINATE REDUCTION;  Surgeon: Shoemaker, David, MD;  Location: [facility];  Service: ENT;  Laterality: Bilateral;   NASAL SEPTUM SURGERY     REDUCTION MAMMAPLASTY     TOTAL KNEE ARTHROPLASTY  03/18/2012   Procedure: TOTAL KNEE ARTHROPLASTY;  Surgeon: Frank V Aluisio, MD;  Location: [facility];  Service: Orthopedics;  Laterality: Left;   TOTAL KNEE ARTHROPLASTY Right 07/23/2017   Procedure: RIGHT TOTAL KNEE ARTHROPLASTY;  Surgeon: Aluisio, Frank, MD;  Location: [facility];  Service: Orthopedics;  Laterality: Right;   Patient Active Problem  List   Diagnosis Date Noted   Postmenopausal 12/05/2020   Urge incontinence of urine 12/05/2020   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   Age-related osteoporosis without current pathological fracture 12/02/2020   Deviated septum 10/04/2016   Nasal turbinate hypertrophy 10/04/2016   Itchy eyes 09/13/2016   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 smear of cervix 01/24/2016   Postop Hyponatremia 03/20/2012   OA (osteoarthritis) of knee 03/18/2012   Sinusitis, chronic 11/25/2008   Obstructive sleep apnea 06/14/2007   HYPERTENSION 06/14/2007   Seasonal and perennial allergic rhinitis 06/14/2007   Asthma, moderate persistent 06/14/2007   GERD 06/14/2007    PCP: Clinton Young MD   REFERRING PROVIDER: Dr Frank Alusio   REFERRING DIAG: M25.551 (ICD-10-CM) - Pain in right hip ( no pain right now )  History of right hip dislocation   THERAPY DIAG:  Other abnormalities of gait and mobility  Stiffness of right hip, not elsewhere classified  Muscle weakness (generalized)  Rationale for Evaluation and Treatment Rehabilitation  ONSET DATE: Last dislocation was aprox.  April/May of 2023   SUBJECTIVE:   She'd like to review   her aquatic HEP today, then feels she won't need any more aquatic visits after today's visit.   Overall she's pleased with how things are going with her hip so far.   SUBJECTIVE STATEMENT (EVAL): The patient has a history of two right hip dislocations. She was able to et her hip back into socket without going to the ED. She has not had a dislocation since April/ May. She is not having any pain at this time. She has reduced the amonut of activity that she has been able to do. She is concerned about continuing with her current exercises.   PERTINENT HISTORY: Bilateral knee replacement; bilateral hip replacement; osteoperosis, both hips have dislocated.   PAIN:  Are you having pain? no  Pain location:  Pain description: aching  when it does hurt  Aggravating factors: No pain at this time  Relieving factors: rest   PRECAUTIONS: None  WEIGHT BEARING RESTRICTIONS No  FALLS:  Has patient fallen in last 6 months? No  LIVING ENVIRONMENT: 3 steps into the house  OCCUPATION: retired  Hobbies: paying bridge  Reading        PLOF: Independent  PATIENT GOALS     OBJECTIVE:   DIAGNOSTIC FINDINGS:   PATIENT SURVEYS:  FOTO    COGNITION:  Overall cognitive status: Within functional limits for tasks assessed     SENSATION: WFL   POSTURE: No Significant postural limitations  PALPATION: No unexpected tenderness to palpation  LOWER EXTREMITY ROM:  Passive ROM Right eval Left eval  Hip flexion Can get to 105 without any feeling like she is dislocating    Hip extension    Hip abduction    Hip adduction    Hip internal rotation Mild limitations without pain    Hip external rotation Mild limitations without pain    Knee flexion    Knee extension    Ankle dorsiflexion    Ankle plantarflexion    Ankle inversion    Ankle eversion     (Blank rows = not tested)  LOWER EXTREMITY MMT:  MMT Right eval Left eval  Hip flexion 9.8 18.7  Hip extension    Hip abduction 42.2 30.1   Hip adduction    Hip internal rotation    Hip external rotation    Knee flexion    Knee extension 30.8 26.1  Ankle dorsiflexion    Ankle plantarflexion    Ankle inversion    Ankle eversion     (Blank rows = not tested)  LOWER EXTREMITY SPECIAL TESTS:   GAIT: Mild lateral movement with standing and walking     TODAY'S TREATMENT: 8/25  Pt seen for aquatic therapy today.  Treatment took place in water 3.25-4.5 ft in depth at the [facility] pool. Temp of water was 91.  Pt entered/exited the pool via steps using step-to pattern with hand rail, independently.  Walking forward/ backward without UE support Side stepping L/R Forward step downs/ retro step ups x 5 x 2 each LE Lateral step ups x 10  each LE Side stepping with rainbow hand buoys arm abdct/add Ab set with blue noodle/ kickboard pull down to thighs x 10 Without support- SLS with 3 way toe tap Holding wall: 3 way LE kick x 10 each leg; heel toe raises; single leg clams Holding yellow hand buoys:  tandem gait forward / backward STS at bench Single leg clams with wall support and yellow hand buoy support   Pt requires the buoyancy and hydrostatic pressure of water for   support, and to offload joints by unweighting joint load by at least 50 % in navel deep water and by at least 75-80% in chest to neck deep water.  Viscosity of the water is needed for resistance of strengthening. Water current perturbations provides challenge to standing balance requiring increased core activation.  8/22 Nu-step  Seat 9 Level 3 5 min  Cable chop 5 lbs  Eye level 2x10   Pallof Press 5lbs 2x10   Life fitness 12 25 lbs Seat 4 2x15   Hip abduction Number 6  55 lbs 2x15  PATIENT EDUCATION:  Education details: reviewed positions to avoid;  Person educated: Patient Education method: Explanation, Demonstration, Tactile cues, Verbal cues, and Handouts Education comprehension: verbalized understanding, returned demonstration, verbal cues required, tactile cues required, and needs further education   HOME EXERCISE PROGRAM: Access Code: [email or web address] URL: [email or web address] Date: 04/21/2022 Prepared by: [facility]  Exercises: Land-based - Supine Bridge  - 1 x daily - 7 x weekly - 3 sets - 10 reps - Standing Hip Extension with Counter Support  - 1 x daily - 7 x weekly - 3 sets - 10 reps - Standing Hip Abduction with Counter Support  - 1 x daily - 7 x weekly - 3 sets - 10 reps - Standing March with Counter Support  - 1 x daily - 7 x weekly - 3 sets - 10 reps - Forward Step Down  - 1 x daily - 2 x weekly - 1 sets - 10 reps - Step Up  - 1 x daily - 2 x weekly - 1 sets - 10 reps - Knee Extension  with Weight Machine  - 1 x daily - 7 x weekly - 3 sets - 10 reps - Squatting Anti-Rotation Press  - 1 x daily - 7 x weekly - 3 sets - 10 reps - Standing Cable Chops  - 1 x daily - 7 x weekly - 3 sets - 10 reps  Aquatic  - Side Stepping with Hand Floats  - 1 x daily - 2 x weekly - 3 sets - 1 reps - Standing 3-Way Leg Reach  - 1 x daily - 7 x weekly - 3 sets - 10 reps - Standing 3-Way Kick with Ankle Float at Pool Wall  - 1 x daily - 7 x weekly - 1 sets - 10 reps - Heel Toe Raises at Pool Wall  - 1 x daily - 2 x weekly - 2 sets - 10 reps - Squat  - 1 x daily - 7 x weekly - 2 sets - 10 reps - Forward Backward Tandem Walking with Hand Floats  - 1 x daily - 2 x weekly - 3 reps - Shoulder Flexion Extension with Pool Noodle (ab set, pulling noodle to thighs) - 1 x daily - 7 x weekly - 2 sets - 10 reps - Backward Step Up  - 1 x daily - 7 x weekly - 2 sets - 10 reps - Lateral Step Up  - 1 x daily - 7 x weekly - 2 sets - 10 reps   ASSESSMENT:  CLINICAL IMPRESSION: Reviewed current aquatic HEP and answered any related questions; added notes to laminated sheet for clarity.  All exercises tolerated well with out symptoms.  Pt is making good progress towards established goals.    OBJECTIVE IMPAIRMENTS Abnormal gait, decreased activity tolerance, difficulty walking, decreased ROM, decreased strength, and pain.   ACTIVITY LIMITATIONS lifting, bending, sitting, standing, and   squatting  PARTICIPATION LIMITATIONS: cleaning, laundry, driving, and shopping  PERSONAL FACTORS 3+ comorbidities:    are also affecting patient's functional outcome.   REHAB POTENTIAL: Good  CLINICAL DECISION MAKING: Evolving/moderate complexity multiple dislocations and decreased activity to prevent dislocations   EVALUATION COMPLEXITY: Moderate   GOALS: Goals reviewed with patient? Yes  SHORT TERM GOALS: Target date: 04/25/22 Patient will increase hip flexion strength by 5 lbs on the right side  Baseline: Goal status:  Ongoing   2.  Patient will be independent with base exercise program without dislocations  Baseline:  Goal status:Partially met - 04/28/22  3.  Patiet will flex hip to 110 without feeling of dislocation  Baseline:  Goal status: Ongoing   LONG TERM GOALS: Target date: 05/24/22  Patient will have no incidence of dislocating  Baseline:  Goal status: INITIAL  2.  Patient will demonstrate optimal strength and stability prior to her surgery  Baseline:  Goal status: INITIAL   PLAN: PT FREQUENCY: 2x/week  PT DURATION: 8 weeks  PLANNED INTERVENTIONS: Therapeutic exercises, Therapeutic activity, Neuromuscular re-education, Balance training, Gait training, Patient/Family education, Self Care, Joint mobilization, Stair training, Aquatic Therapy, Dry Needling, Cryotherapy, Moist heat, Taping, Ionotophoresis 4mg/ml Dexamethasone, and Manual therapy  PLAN FOR NEXT SESSION: review Land HEP; assess goals and d/c per pt request.   Jennifer Carlson-Long, PTA 04/28/22 12:15 PM      

## 2022-05-02 ENCOUNTER — Ambulatory Visit (HOSPITAL_BASED_OUTPATIENT_CLINIC_OR_DEPARTMENT_OTHER): Payer: Medicare PPO | Admitting: Physical Therapy

## 2022-05-04 ENCOUNTER — Ambulatory Visit (HOSPITAL_BASED_OUTPATIENT_CLINIC_OR_DEPARTMENT_OTHER): Payer: Medicare PPO | Admitting: Physical Therapy

## 2022-05-09 ENCOUNTER — Encounter (HOSPITAL_BASED_OUTPATIENT_CLINIC_OR_DEPARTMENT_OTHER): Payer: Self-pay | Admitting: Physical Therapy

## 2022-05-09 ENCOUNTER — Ambulatory Visit (HOSPITAL_BASED_OUTPATIENT_CLINIC_OR_DEPARTMENT_OTHER): Payer: Medicare PPO | Attending: Orthopedic Surgery | Admitting: Physical Therapy

## 2022-05-09 DIAGNOSIS — M25651 Stiffness of right hip, not elsewhere classified: Secondary | ICD-10-CM | POA: Diagnosis not present

## 2022-05-09 DIAGNOSIS — R2689 Other abnormalities of gait and mobility: Secondary | ICD-10-CM | POA: Diagnosis not present

## 2022-05-09 DIAGNOSIS — M6281 Muscle weakness (generalized): Secondary | ICD-10-CM | POA: Diagnosis not present

## 2022-05-09 NOTE — Therapy (Addendum)
OUTPATIENT PHYSICAL THERAPY LOWER EXTREMITY / Discharge    Patient Name: [name] MRN: [id number] DOB:09/26/1950, 71 y.o., female Today's Date: 05/09/2022   PT End of Session - 05/09/22 1528     Visit Number 9    Number of Visits 16    Date for PT Re-Evaluation 05/23/22    Authorization Type [employer] MCR    PT Start Time 1522   7 min   PT Stop Time 1600    PT Time Calculation (min) 38 min    Activity Tolerance Patient tolerated treatment well    Behavior During Therapy WFL for tasks assessed/performed               Past Medical History:  Diagnosis Date   Acid reflux    Anemia    "during the time I was taking meloxicam"   Arthritis    Asthma    LOV  6/13  Dr Annamaria Boots EPIC/ clearance on chart  from 10/12   Depression    Dysrhythmia    PVC's with caffeine and anxiety   Heart murmur    since childhood   Hiatal hernia    High cholesterol    Hypertension    OV with clearance and note Dr Addison Lank 6/13 chart   Osteoporosis    Sleep apnea    no CPAP/ last study 9 yrs ago- "mild per patient"   STD (sexually transmitted disease)    HSV2   Stye 06/2009   eye   Past Surgical History:  Procedure Laterality Date   bilateral hip replacement Bilateral 6/07, 9/07   right, then left   BREAST REDUCTION SURGERY     CESAREAN SECTION     FRACTURE SURGERY     Left leg-1977   NASAL SEPTOPLASTY W/ TURBINOPLASTY Bilateral 07/19/2018   Procedure: NASAL SEPTOPLASTY WITH TURBINATE REDUCTION;  Surgeon: Jerrell Belfast, MD;  Location: [place];  Service: ENT;  Laterality: Bilateral;   NASAL SEPTUM SURGERY     REDUCTION MAMMAPLASTY     TOTAL KNEE ARTHROPLASTY  03/18/2012   Procedure: TOTAL KNEE ARTHROPLASTY;  Surgeon: Gearlean Alf, MD;  Location: [facility];  Service: Orthopedics;  Laterality: Left;   TOTAL KNEE ARTHROPLASTY Right 07/23/2017   Procedure: RIGHT TOTAL KNEE ARTHROPLASTY;  Surgeon: Gaynelle Arabian, MD;  Location: [facility];  Service: Orthopedics;  Laterality: Right;    Patient Active Problem List   Diagnosis Date Noted   Postmenopausal 12/05/2020   Urge incontinence of urine 12/05/2020   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   Age-related osteoporosis without current pathological fracture 12/02/2020   Deviated septum 10/04/2016   Nasal turbinate hypertrophy 10/04/2016   Itchy eyes 09/13/2016   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 smear of cervix 01/24/2016   Postop Hyponatremia 03/20/2012   OA (osteoarthritis) of knee 03/18/2012   Sinusitis, chronic 11/25/2008   Obstructive sleep apnea 06/14/2007   HYPERTENSION 06/14/2007   Seasonal and perennial allergic rhinitis 06/14/2007   Asthma, moderate persistent 06/14/2007   GERD 06/14/2007    PCP: Baird Lyons MD   REFERRING PROVIDER: Dr Hector Shade   REFERRING DIAG: M25.551 (ICD-10-CM) - Pain in right hip ( no pain right now )  History of right hip dislocation   THERAPY DIAG:  Other abnormalities of gait and mobility  Stiffness of right hip, not elsewhere classified  Muscle weakness (generalized)  Rationale for Evaluation and Treatment Rehabilitation  ONSET DATE: Last dislocation was aprox.  April/May of 2023   SUBJECTIVE:  She'd like to review her aquatic HEP today, then feels she won't need any more aquatic visits after today's visit.   Overall she's pleased with how things are going with her hip so far.   SUBJECTIVE STATEMENT (EVAL): The patient has a history of two right hip dislocations. She was able to et her hip back into socket without going to the ED. She has not had a dislocation since April/ May. She is not having any pain at this time. She has reduced the amonut of activity that she has been able to do. She is concerned about continuing with her current exercises.   PERTINENT HISTORY: Bilateral knee replacement; bilateral hip replacement; osteoperosis, both hips have dislocated.   PAIN:  Are you having pain? no  Pain location:   Pain description: aching when it does hurt  Aggravating factors: No pain at this time  Relieving factors: rest   PRECAUTIONS: None  WEIGHT BEARING RESTRICTIONS No  FALLS:  Has patient fallen in last 6 months? 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bridge  Reading        PLOF: Independent  PATIENT GOALS     OBJECTIVE:   DIAGNOSTIC FINDINGS:   PATIENT SURVEYS:  FOTO    COGNITION:  Overall cognitive status: Within functional limits for tasks assessed     SENSATION: WFL   POSTURE: No Significant postural limitations  PALPATION: No unexpected tenderness to palpation  LOWER EXTREMITY ROM:  Passive ROM Right eval Left eval  Hip flexion Can get to 105 without any feeling like she is dislocating    Hip extension    Hip abduction    Hip adduction    Hip internal rotation Mild limitations without pain    Hip external rotation Mild limitations without pain    Knee flexion    Knee extension    Ankle dorsiflexion    Ankle plantarflexion    Ankle inversion    Ankle eversion     (Blank rows = not tested)  LOWER EXTREMITY MMT:  MMT Right eval Left eval  Hip flexion 9.8 18.7  Hip extension    Hip abduction 42.2 30.1   Hip adduction    Hip internal rotation    Hip external rotation    Knee flexion    Knee extension 30.8 26.1  Ankle dorsiflexion    Ankle plantarflexion    Ankle inversion    Ankle eversion     (Blank rows = not tested)  LOWER EXTREMITY SPECIAL TESTS:   GAIT: Mild lateral movement with standing and walking     TODAY'S TREATMENT: 9/5 Standing hip 3 way x20 bilateral  Seated LAQ 2x15 felt some pain in the left hip so halted   Supine march 2x15  Bridge 2x15  Supine clamshell 2x15 green  LTR x20   Reviewed whole HEP and what to do until her surgery   Reviewed FOTO score       8/25  Pt seen for aquatic therapy today.  Treatment took place in water 3.25-4.5 ft in depth  at the [facility]. Temp of water was 91.  Pt entered/exited the pool via steps using step-to pattern with hand rail, independently.  Walking forward/ backward without UE support Side stepping L/R Forward step downs/ retro step ups x 5 x 2 each LE Lateral step ups x 10 each LE Side stepping with rainbow hand buoys arm abdct/add Ab set with blue noodle/ kickboard pull down to  thighs x 10 Without support- SLS with 3 way toe tap Holding wall: 3 way LE kick x 10 each leg; heel toe raises; single leg clams Holding yellow hand buoys:  tandem gait forward / backward STS at bench Single leg clams with wall support and yellow hand buoy support   Pt requires the buoyancy and hydrostatic pressure of water for support, and to offload joints by unweighting joint load by at least 50 % in navel deep water and by at least 75-80% in chest to neck deep water.  Viscosity of the water is needed for resistance of strengthening. Water current perturbations provides challenge to standing balance requiring increased core activation.  8/22 Nu-step  Seat 9 Level 3 5 min  Cable chop 5 lbs  Eye level 2x10   Pallof Press 5lbs 2x10   Life fitness 12 25 lbs Seat 4 2x15   Hip abduction Number 6  55 lbs 2x15  PATIENT EDUCATION:  Education details: reviewed positions to avoid;  Person educated: Patient Education method: Consulting civil engineer, Demonstration, Corporate treasurer cues, Verbal cues, and Handouts Education comprehension: verbalized understanding, returned demonstration, verbal cues required, tactile cues required, and needs further education   HOME EXERCISE PROGRAM: Access Code: [email or web address] URL: [email or web address] Date: 04/21/2022 Prepared by: [place]  Exercises: Land-based - Supine Bridge  - 1 x daily - 7 x weekly - 3 sets - 10 reps - Standing Hip Extension with Counter Support  - 1 x daily - 7 x weekly - 3 sets - 10 reps - Standing Hip Abduction  with Counter Support  - 1 x daily - 7 x weekly - 3 sets - 10 reps - Standing March with Counter Support  - 1 x daily - 7 x weekly - 3 sets - 10 reps - Forward Step Down  - 1 x daily - 2 x weekly - 1 sets - 10 reps - Step Up  - 1 x daily - 2 x weekly - 1 sets - 10 reps - Knee Extension with Weight Machine  - 1 x daily - 7 x weekly - 3 sets - 10 reps - Squatting Anti-Rotation Press  - 1 x daily - 7 x weekly - 3 sets - 10 reps - Standing Cable Chops  - 1 x daily - 7 x weekly - 3 sets - 10 reps  Aquatic  - Side Stepping with Hand Floats  - 1 x daily - 2 x weekly - 3 sets - 1 reps - Standing 3-Way Leg Reach  - 1 x daily - 7 x weekly - 3 sets - 10 reps - Standing 3-Way Kick with Ankle Float at [employer]  - 1 x daily - 7 x weekly - 1 sets - 10 reps - Heel Toe Raises at Spangle  - 1 x daily - 2 x weekly - 2 sets - 10 reps - Squat  - 1 x daily - 7 x weekly - 2 sets - 10 reps - Forward Backward Tandem Walking with Hand Floats  - 1 x daily - 2 x weekly - 3 reps - Shoulder Flexion Extension with Pool Noodle (ab set, pulling noodle to thighs) - 1 x daily - 7 x weekly - 2 sets - 10 reps - Backward Step Up  - 1 x daily - 7 x weekly - 2 sets - 10 reps - Lateral Step Up  - 1 x daily - 7 x weekly - 2 sets - 10 reps   ASSESSMENT:  CLINICAL IMPRESSION: The patient has made good progress. She has had no incidence of dislocation. Over the next few weeks she will have appointments with her husband. Her next scheduled visit is right before her surgery. She advised that she has all of her exercises at this point and likely doesn't need that last appointment. We will D/C at this time. We reviewed her whole HEP. She tolerated well. She will continue to work on her exercises.   OBJECTIVE IMPAIRMENTS Abnormal gait, decreased activity tolerance, difficulty walking, decreased ROM, decreased strength, and pain.   ACTIVITY LIMITATIONS lifting, bending, sitting, standing, and squatting  PARTICIPATION LIMITATIONS:  cleaning, laundry, driving, and shopping  PERSONAL FACTORS 3+ comorbidities:    are also affecting patient's functional outcome.   REHAB POTENTIAL: Good  CLINICAL DECISION MAKING: Evolving/moderate complexity multiple dislocations and decreased activity to prevent dislocations   EVALUATION COMPLEXITY: Moderate   GOALS: Goals reviewed with patient? Yes  SHORT TERM GOALS: Target date: 04/25/22 Patient will increase hip flexion strength by 5 lbs on the right side  Baseline: Goal status: Ongoing   2.  Patient will be independent with base exercise program without dislocations  Baseline:  Goal status:Partially met - 04/28/22  3.  Patiet will flex hip to 110 without feeling of dislocation  Baseline:  Goal status: Ongoing   LONG TERM GOALS: Target date: 05/24/22  Patient will have no incidence of dislocating  Baseline:  Goal status: INITIAL  2.  Patient will demonstrate optimal strength and stability prior to her surgery  Baseline:  Goal status: INITIAL   PLAN: PT FREQUENCY: 2x/week  PT DURATION: 8 weeks  PLANNED INTERVENTIONS: Therapeutic exercises, Therapeutic activity, Neuromuscular re-education, Balance training, Gait training, Patient/Family education, Self Care, Joint mobilization, Stair training, Aquatic Therapy, Dry Needling, Cryotherapy, Moist heat, Taping, Ionotophoresis 44m/ml Dexamethasone, and Manual therapy  PLAN FOR NEXT SESSION: review Land HEP; assess goals and d/c per pt request.    PHYSICAL THERAPY DISCHARGE SUMMARY  Visits from Start of Care: 9  Current functional level related to goals / functional outcomes: Improved strength and functional mobility   Remaining deficits: Set to have a hip replacement    Education / Equipment: HEP    Patient agrees to discharge. Patient goals were met. Patient is being discharged due to meeting the stated rehab goals.  DCarolyne LittlesPT DPT  05/09/22 3:30 PM

## 2022-05-15 ENCOUNTER — Encounter (HOSPITAL_BASED_OUTPATIENT_CLINIC_OR_DEPARTMENT_OTHER): Payer: Medicare PPO | Admitting: Physical Therapy

## 2022-05-15 DIAGNOSIS — M1611 Unilateral primary osteoarthritis, right hip: Secondary | ICD-10-CM | POA: Insufficient documentation

## 2022-05-16 NOTE — H&P (Signed)
HIP ADMISSION H&P  Subjective:  Chief Complaint: Right hip instability  HPI: [name], 71 y.o. female, who comes to the clinic for follow-up status post bilateral total hip arthroplasties.  The patient indicates her right hip has dislocated 2 times, but it never popped completely out. She notes the first time she was sitting at a table in the kitchen working and felt her hip dislocate. She states that when she stood up, it went back in.  A few weeks later the patient indicates that she was sitting on a stool when her hip began to dislocate. She states she tried standing up to see if it would relocate, but she was unable to get it to go back in. She went to the Emergency Department where they helped her maneuver in a certain way and it went back in.  The patient indicates she has been doing the exercises that she remembers to strengthen her hips. She notes she has also had someone help her with pelvic floor exercises. She states she has always been afraid her hip would dislocate again. She notes her hip is not significantly painful.  Patient Active Problem List   Diagnosis Date Noted   Postmenopausal 12/05/2020   Urge incontinence of urine 12/05/2020   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   Age-related osteoporosis without current pathological fracture 12/02/2020   Deviated septum 10/04/2016   Nasal turbinate hypertrophy 10/04/2016   Itchy eyes 09/13/2016   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 smear of cervix 01/24/2016   Postop Hyponatremia 03/20/2012   OA (osteoarthritis) of knee 03/18/2012   Sinusitis, chronic 11/25/2008   Obstructive sleep apnea 06/14/2007   HYPERTENSION 06/14/2007   Seasonal and perennial allergic rhinitis 06/14/2007   Asthma, moderate persistent 06/14/2007   GERD 06/14/2007    Past Medical History:  Diagnosis Date   Acid reflux    Anemia    "during the time I was taking meloxicam"   Arthritis    Asthma     LOV  6/13  Dr Annamaria Boots EPIC/ clearance on chart  from 10/12   Depression    Dysrhythmia    PVC's with caffeine and anxiety   Heart murmur    since childhood   Hiatal hernia    High cholesterol    Hypertension    OV with clearance and note Dr Addison Lank 6/13 chart   Osteoporosis    Sleep apnea    no CPAP/ last study 9 yrs ago- "mild per patient"   STD (sexually transmitted disease)    HSV2   Stye 06/2009   eye    Past Surgical History:  Procedure Laterality Date   bilateral hip replacement Bilateral 6/07, 9/07   right, then left   BREAST REDUCTION SURGERY     CESAREAN SECTION     FRACTURE SURGERY     Left leg-1977   NASAL SEPTOPLASTY W/ TURBINOPLASTY Bilateral 07/19/2018   Procedure: NASAL SEPTOPLASTY WITH TURBINATE REDUCTION;  Surgeon: Jerrell Belfast, MD;  Location: [place];  Service: ENT;  Laterality: Bilateral;   NASAL SEPTUM SURGERY     REDUCTION MAMMAPLASTY     TOTAL KNEE ARTHROPLASTY  03/18/2012   Procedure: TOTAL KNEE ARTHROPLASTY;  Surgeon: Gearlean Alf, MD;  Location: [facility];  Service: Orthopedics;  Laterality: Left;   TOTAL KNEE ARTHROPLASTY Right 07/23/2017   Procedure: RIGHT TOTAL KNEE ARTHROPLASTY;  Surgeon: Gaynelle Arabian, MD;  Location: [facility];  Service: Orthopedics;  Laterality: Right;    Prior to Admission  medications   Medication Sig Start Date End Date Taking? Authorizing Provider  albuterol (VENTOLIN HFA) 108 (90 Base) MCG/ACT inhaler INHALE 2 PUFFS INTO THE LUNGS EVERY 4 HOURS AS NEEDED FOR WHEEZE OR FOR SHORTNESS OF BREATH 04/21/21   Baird Lyons D, MD  amLODipine (NORVASC) 5 MG tablet Take 5 mg by mouth daily before breakfast.    [provider]  amoxicillin-clavulanate (AUGMENTIN) 875-125 MG tablet Take 1 tablet by mouth 2 (two) times daily. Patient not taking: Reported on 03/28/2022 02/21/22   Deneise Lever, MD  atorvastatin (LIPITOR) 40 MG tablet atorvastatin 40 mg tablet    [provider]  Calcium Carb-Cholecalciferol (CALCIUM  + VITAMIN D3 PO) Take 1 tablet by mouth daily.    [provider]  cetirizine (ZYRTEC) 10 MG tablet Take 10 mg by mouth daily as needed for allergies.     [provider]  Cholecalciferol (VITAMIN D3) 5000 units CAPS Take 5,000 Units by mouth daily.    [provider]  cloNIDine (CATAPRES) 0.1 MG tablet Take 0.1 mg by mouth at bedtime.    [provider]  denosumab (PROLIA) 60 MG/ML SOSY injection See admin instructions. 08/16/18   [provider]  esomeprazole (NEXIUM) 40 MG capsule Nexium 40 mg capsule,delayed release   40 mg by oral route.    [provider]  Eszopiclone 3 MG TABS Take 3 mg by mouth at bedtime. Take immediately before bedtime    [provider]  fluticasone (FLONASE) 50 MCG/ACT nasal spray PLACE 2 SPRAYS INTO BOTH NOSTRILS AT BEDTIME. 02/28/22   Baird Lyons D, MD  Fluticasone-Umeclidin-Vilant (TRELEGY ELLIPTA) 100-62.5-25 MCG/ACT AEPB TAKE 1 PUFF BY MOUTH EVERY DAY 03/17/22   Baird Lyons D, MD  magnesium gluconate (MAGONATE) 500 MG tablet Take 500 mg at bedtime by mouth.     [provider]  methocarbamol (ROBAXIN) 500 MG tablet Take 1 tablet (500 mg total) every 6 (six) hours as needed by mouth for muscle spasms. Patient taking differently: Take 500 mg by mouth every 8 (eight) hours as needed for muscle spasms. 07/24/17   Perkins, Alexzandrew L, PA-C  montelukast (SINGULAIR) 10 MG tablet TAKE 1 TABLET BY MOUTH EVERY DAY 12/12/21   Baird Lyons D, MD  Multiple Vitamin (MULTI-VITAMINS) TABS Take 1 tablet by mouth daily.     [provider]  valACYclovir (VALTREX) 500 MG tablet Take 1 tablet (500 mg total) by mouth daily. Increase to bid x 3 days with outbreaks. 02/02/22   Megan Salon, MD    Allergies  Allergen Reactions   Demerol [Meperidine] Nausea And Vomiting   Lansoprazole     Other reaction(s): Unknown   Meloxicam Other (See Comments)    Blood count went down    Nadolol Other (See  Comments)    Low blood pressure     Social History   Socioeconomic History   Marital status: Married    Spouse name: Not on file   Number of children: 1   Years of education: Not on file   Highest education level: Not on file  Occupational History   Occupation: RETIRED    Employer: RETIRED  Tobacco Use   Smoking status: Never   Smokeless tobacco: Never  Vaping Use   Vaping Use: Never used  Substance and Sexual Activity   Alcohol use: Yes    Alcohol/week: 0.0 - 1.0 standard drinks of alcohol   Drug use: No   Sexual activity: Not Currently    Partners: Male  Other Topics Concern   Not on file  Social History Narrative   Not on file   Social Determinants of Health   Financial Resource Strain: Not on file  Food Insecurity: Not on file  Transportation Needs: Not on file  Physical Activity: Not on file  Stress: Not on file  Social Connections: Not on file  Intimate Partner Violence: Not on file    Tobacco Use: Low Risk  (05/09/2022)   Patient History    Smoking Tobacco Use: Never    Smokeless Tobacco Use: Never    Passive Exposure: Not on file   Social History   Substance and Sexual Activity  Alcohol Use Yes   Alcohol/week: 0.0 - 1.0 standard drinks of alcohol    Family History  Problem Relation Age of Onset   Heart disease Father    Cancer Father    Hypertension Father    Kidney Stones Father    Arthritis Mother    Hypertension Mother    Pancreatic cancer Other    Coronary artery disease Other    Cancer Other    Asthma Other    Nephritis Daughter        glomerular    Review of Systems  Constitutional:  Negative for chills and fever.  HENT: Negative.    Eyes: Negative.   Respiratory:  Negative for cough and shortness of breath.   Cardiovascular:  Negative for chest pain and palpitations.  Gastrointestinal:  Negative for abdominal pain, constipation, diarrhea, nausea and vomiting.  Genitourinary:  Negative for dysuria, frequency and urgency.   Musculoskeletal:  Positive for joint pain.  Skin:  Negative for rash.   Objective:  Physical Exam: Well nourished and well developed.  General: Alert and oriented x3, cooperative and pleasant, no acute distress.  Head: normocephalic, atraumatic, neck supple.  Eyes: EOMI.  Abdomen: non-tender to palpation and soft, normoactive bowel sounds. Musculoskeletal: The patient has a non-antalgic gait and ambulates independently.  Right Hip Exam: The range of motion: Flexion to 110 degrees, Internal Rotation to 20 degrees, External Rotation to 30 degrees, and abduction to 30 degrees without discomfort. There is no tenderness over the greater trochanteric bursa.  Left Hip Exam: The range of motion: Flexion to 110 degrees, internal rotation to 20 degrees, external rotation to 30 degrees, and abduction to 30 degrees without discomfort. There is no tenderness over the greater trochanteric bursa.  Calves soft and nontender. Motor function intact in LE. Strength 5/5 LE bilaterally. Neuro: Distal pulses 2+. Sensation to light touch intact in LE.  Vital signs in last 24 hours: BP: ()/()  Arterial Line BP: ()/()   Imaging Review - AP pelvis, AP and lateral of the bilateral hips dated 12/28/2021 demonstrate the prosthesis in excellent position with no periprosthetic abnormalities. No evidence of any wear or osteolysis.  Assessment/Plan:  Right hip instability  We discussed the patient's presenting complaints, history, and treatment options. She has had a left hip dislocation a long time ago. She has had what sounds to be 2 subluxation episodes on the right. She feels like the instability is really hampering her. The right hip is way more symptomatic at this point. I feel that in order to improve her stability, we are going to have to revise this. She has a metal-on-metal in now. We would have to change it to a metal on polyethylene, perhaps do a constrained liner or dual mobility cup. We will have  those options available. We will do the right side first since  it is more symptomatic.  The patient acknowledged the explanation, agreed to proceed with the plan and consent was signed. Patient is being admitted for inpatient treatment for surgery, pain control, PT, OT, prophylactic antibiotics, VTE prophylaxis, progressive ambulation and ADLs and discharge planning.The patient is planning to be discharged  home .  Therapy Plans: HEP Disposition: Home with Husband/Daughter Planned DVT Prophylaxis: Aspirin 325 mg BID DME Needed: None PCP: Cari Caraway, MD (appt 09/19) TXA: IV Allergies: meperidine (N/V), nadolol Anesthesia Concerns: None BMI: 30.6 Last HgbA1c: not diabetic  Pharmacy: [facility] ([place])  Other: -PCP appt 9/19 - given clearance form to take with her  - Patient was instructed on what medications to stop prior to surgery. - Follow-up visit in 2 weeks with Dr. Wynelle Link - Begin physical therapy following surgery - Pre-operative lab work as pre-surgical testing - Prescriptions will be provided in hospital at time of discharge  R. Jaynie Bream, PA-C Orthopedic Surgery [facility] [place]

## 2022-05-17 DIAGNOSIS — E559 Vitamin D deficiency, unspecified: Secondary | ICD-10-CM | POA: Diagnosis not present

## 2022-05-17 DIAGNOSIS — R7301 Impaired fasting glucose: Secondary | ICD-10-CM | POA: Diagnosis not present

## 2022-05-17 DIAGNOSIS — Z79899 Other long term (current) drug therapy: Secondary | ICD-10-CM | POA: Diagnosis not present

## 2022-05-17 DIAGNOSIS — E782 Mixed hyperlipidemia: Secondary | ICD-10-CM | POA: Diagnosis not present

## 2022-05-17 DIAGNOSIS — I10 Essential (primary) hypertension: Secondary | ICD-10-CM | POA: Diagnosis not present

## 2022-05-18 DIAGNOSIS — E669 Obesity, unspecified: Secondary | ICD-10-CM | POA: Diagnosis not present

## 2022-05-18 DIAGNOSIS — Z6830 Body mass index (BMI) 30.0-30.9, adult: Secondary | ICD-10-CM | POA: Diagnosis not present

## 2022-05-18 DIAGNOSIS — I10 Essential (primary) hypertension: Secondary | ICD-10-CM | POA: Diagnosis not present

## 2022-05-18 DIAGNOSIS — R7303 Prediabetes: Secondary | ICD-10-CM | POA: Diagnosis not present

## 2022-05-19 ENCOUNTER — Encounter (HOSPITAL_BASED_OUTPATIENT_CLINIC_OR_DEPARTMENT_OTHER): Payer: Medicare PPO | Admitting: Physical Therapy

## 2022-05-23 DIAGNOSIS — Z96641 Presence of right artificial hip joint: Secondary | ICD-10-CM | POA: Diagnosis not present

## 2022-05-23 DIAGNOSIS — K219 Gastro-esophageal reflux disease without esophagitis: Secondary | ICD-10-CM | POA: Diagnosis not present

## 2022-05-23 DIAGNOSIS — J45909 Unspecified asthma, uncomplicated: Secondary | ICD-10-CM | POA: Diagnosis not present

## 2022-05-23 DIAGNOSIS — E669 Obesity, unspecified: Secondary | ICD-10-CM | POA: Diagnosis not present

## 2022-05-23 DIAGNOSIS — M810 Age-related osteoporosis without current pathological fracture: Secondary | ICD-10-CM | POA: Diagnosis not present

## 2022-05-23 DIAGNOSIS — I10 Essential (primary) hypertension: Secondary | ICD-10-CM | POA: Diagnosis not present

## 2022-05-23 DIAGNOSIS — T84020D Dislocation of internal right hip prosthesis, subsequent encounter: Secondary | ICD-10-CM | POA: Diagnosis not present

## 2022-05-23 DIAGNOSIS — R7303 Prediabetes: Secondary | ICD-10-CM | POA: Diagnosis not present

## 2022-05-23 DIAGNOSIS — E782 Mixed hyperlipidemia: Secondary | ICD-10-CM | POA: Diagnosis not present

## 2022-05-24 NOTE — Patient Instructions (Signed)
SURGICAL WAITING ROOM VISITATION Patients having surgery or a procedure may have no more than 2 support people in the waiting area - these visitors may rotate.   Children under the age of 22 must have an adult with them who is not the patient. If the patient needs to stay at the hospital during part of their recovery, the visitor guidelines for inpatient rooms apply. Pre-op nurse will coordinate an appropriate time for 1 support person to accompany patient in pre-op.  This support person may not rotate.    Please refer to the [facility] website for the visitor guidelines for Inpatients (after your surgery is over and you are in a regular room).    Your procedure is scheduled on: 06/07/22   Report to [facility] Main Entrance    Report to admitting at 11:20 AM   Call this number if you have problems the morning of surgery [phone]   Do not eat food :After Midnight.   After Midnight you may have the following liquids until 10:50 AM DAY OF SURGERY  Water Non-Citrus Juices (without pulp, NO RED) Carbonated Beverages Black Coffee (NO MILK/CREAM OR CREAMERS, sugar ok)  Clear Tea (NO MILK/CREAM OR CREAMERS, sugar ok) regular and decaf                             Plain Jell-O (NO RED)                                           Fruit ices (not with fruit pulp, NO RED)                                     Popsicles (NO RED)                                                               Sports drinks like Gatorade (NO RED)              The day of surgery:  Drink ONE (1) Pre-Surgery Clear Ensure at 10:50 AM the morning of surgery. Drink in one sitting. Do not sip.  This drink was given to you during your hospital  pre-op appointment visit. Nothing else to drink after completing the  Pre-Surgery Clear Ensure.          If you have questions, please contact your surgeon's office.   FOLLOW BOWEL PREP AND ANY ADDITIONAL PRE OP INSTRUCTIONS YOU RECEIVED FROM YOUR SURGEON'S OFFICE!!!      Oral Hygiene is also important to reduce your risk of infection.                                    Remember - BRUSH YOUR TEETH THE MORNING OF SURGERY WITH YOUR REGULAR TOOTHPASTE   Take these medicines the morning of surgery with A SIP OF WATER: Inhalers, Amlodipine, Zyrtec, Nexium  You may not have any metal on your body including hair pins, jewelry, and body piercing             Do not wear make-up, lotions, powders, perfumes, or deodorant  Do not wear nail polish including gel and S&S, artificial/acrylic nails, or any other type of covering on natural nails including finger and toenails. If you have artificial nails, gel coating, etc. that needs to be removed by a nail salon please have this removed prior to surgery or surgery may need to be canceled/ delayed if the surgeon/ anesthesia feels like they are unable to be safely monitored.   Do not shave  48 hours prior to surgery.    Do not bring valuables to the hospital. [place].   Bring small overnight bag day of surgery.   DO NOT [place]. PHARMACY WILL DISPENSE MEDICATIONS LISTED ON YOUR MEDICATION LIST TO YOU DURING YOUR ADMISSION [place]!   Special Instructions: Bring a copy of your healthcare power of attorney and living will documents the day of surgery if you haven't scanned them before.              Please read over the following fact sheets you were given: IF [place] [name]   If you received a COVID test during your pre-op visit  it is requested that you wear a mask when out in public, stay away from anyone that may not be feeling well and notify your surgeon if you develop symptoms. If you test positive for Covid or have been in contact with anyone that has tested positive in the last 10 days please notify you surgeon.     [facility] - Preparing for Surgery Before surgery, you can play an important role.  Because skin is not sterile, your skin needs to be as free of germs as possible.  You can reduce the number of germs on your skin by washing with CHG (chlorahexidine gluconate) soap before surgery.  CHG is an antiseptic cleaner which kills germs and bonds with the skin to continue killing germs even after washing. Please DO NOT use if you have an allergy to CHG or antibacterial soaps.  If your skin becomes reddened/irritated stop using the CHG and inform your nurse when you arrive at Short Stay. Do not shave (including legs and underarms) for at least 48 hours prior to the first CHG shower.  You may shave your face/neck.  Please follow these instructions carefully:  1.  Shower with CHG Soap the night before surgery and the  morning of surgery.  2.  If you choose to wash your hair, wash your hair first as usual with your normal  shampoo.  3.  After you shampoo, rinse your hair and body thoroughly to remove the shampoo.                             4.  Use CHG as you would any other liquid soap.  You can apply chg directly to the skin and wash.  Gently with a scrungie or clean washcloth.  5.  Apply the CHG Soap to your body ONLY FROM THE NECK DOWN.   Do   not use on face/ open  Wound or open sores. Avoid contact with eyes, ears mouth and   genitals (private parts).                       Wash face,  Genitals (private parts) with your normal soap.             6.  Wash thoroughly, paying special attention to the area where your    surgery  will be performed.  7.  Thoroughly rinse your body with warm water from the neck down.  8.  DO NOT shower/wash with your normal soap after using and rinsing off the CHG Soap.                9.  Pat yourself dry with a clean towel.            10.  Wear clean pajamas.            11.  Place clean sheets on your bed the night of your first shower and do not  sleep with  pets. Day of Surgery : Do not apply any lotions/deodorants the morning of surgery.  Please wear clean clothes to the hospital/surgery center.  FAILURE TO FOLLOW THESE INSTRUCTIONS MAY RESULT IN THE CANCELLATION OF YOUR SURGERY  PATIENT SIGNATURE_________________________________  NURSE SIGNATURE__________________________________  ________________________________________________________________________   [name]  An incentive spirometer is a tool that can help keep your lungs clear and active. This tool measures how well you are filling your lungs with each breath. Taking long deep breaths may help reverse or decrease the chance of developing breathing (pulmonary) problems (especially infection) following: A long period of time when you are unable to move or be active. BEFORE THE PROCEDURE  If the spirometer includes an indicator to show your best effort, your nurse or respiratory therapist will set it to a desired goal. If possible, sit up straight or lean slightly forward. Try not to slouch. Hold the incentive spirometer in an upright position. INSTRUCTIONS FOR USE  Sit on the edge of your bed if possible, or sit up as far as you can in bed or on a chair. Hold the incentive spirometer in an upright position. Breathe out normally. Place the mouthpiece in your mouth and seal your lips tightly around it. Breathe in slowly and as deeply as possible, raising the piston or the ball toward the top of the column. Hold your breath for 3-5 seconds or for as long as possible. Allow the piston or ball to fall to the bottom of the column. Remove the mouthpiece from your mouth and breathe out normally. Rest for a few seconds and repeat Steps 1 through 7 at least 10 times every 1-2 hours when you are awake. Take your time and take a few normal breaths between deep breaths. The spirometer may include an indicator to show your best effort. Use the indicator as a goal to work toward during  each repetition. After each set of 10 deep breaths, practice coughing to be sure your lungs are clear. If you have an incision (the cut made at the time of surgery), support your incision when coughing by placing a pillow or rolled up towels firmly against it. Once you are able to get out of bed, walk around indoors and cough well. You may stop using the incentive spirometer when instructed by your caregiver.  RISKS AND COMPLICATIONS Take your time so you do not get dizzy or light-headed. If you are in pain, you  may need to take or ask for pain medication before doing incentive spirometry. It is harder to take a deep breath if you are having pain. AFTER USE Rest and breathe slowly and easily. It can be helpful to keep track of a log of your progress. Your caregiver can provide you with a simple table to help with this. If you are using the spirometer at home, follow these instructions: [place] IF:  You are having difficultly using the spirometer. You have trouble using the spirometer as often as instructed. Your pain medication is not giving enough relief while using the spirometer. You develop fever of 100.5 F (38.1 C) or higher. SEEK IMMEDIATE MEDICAL CARE IF:  You cough up bloody sputum that had not been present before. You develop fever of 102 F (38.9 C) or greater. You develop worsening pain at or near the incision site. MAKE SURE YOU:  Understand these instructions. Will watch your condition. Will get help right away if you are not doing well or get worse. Document Released: 01/01/2007 Document Revised: 11/13/2011 Document Reviewed: 03/04/2007 Exit[employer] Patient Information 2014 Exit[employer], [place].   ________________________________________________________________________  WHAT IS A BLOOD TRANSFUSION? Blood Transfusion Information  A transfusion is the replacement of blood or some of its parts. Blood is made up of multiple cells which provide different functions. Red  blood cells carry oxygen and are used for blood loss replacement. White blood cells fight against infection. Platelets control bleeding. Plasma helps clot blood. Other blood products are available for specialized needs, such as hemophilia or other clotting disorders. BEFORE THE TRANSFUSION  Who gives blood for transfusions?  Healthy volunteers who are fully evaluated to make sure their blood is safe. This is blood bank blood. Transfusion therapy is the safest it has ever been in the practice of medicine. Before blood is taken from a donor, a complete history is taken to make sure that person has no history of diseases nor engages in risky social behavior (examples are intravenous drug use or sexual activity with multiple partners). The donor's travel history is screened to minimize risk of transmitting infections, such as malaria. The donated blood is tested for signs of infectious diseases, such as HIV and hepatitis. The blood is then tested to be sure it is compatible with you in order to minimize the chance of a transfusion reaction. If you or a relative donates blood, this is often done in anticipation of surgery and is not appropriate for emergency situations. It takes many days to process the donated blood. RISKS AND COMPLICATIONS Although transfusion therapy is very safe and saves many lives, the main dangers of transfusion include:  Getting an infectious disease. Developing a transfusion reaction. This is an allergic reaction to something in the blood you were given. Every precaution is taken to prevent this. The decision to have a blood transfusion has been considered carefully by your caregiver before blood is given. Blood is not given unless the benefits outweigh the risks. AFTER THE TRANSFUSION Right after receiving a blood transfusion, you will usually feel much better and more energetic. This is especially true if your red blood cells have gotten low (anemic). The transfusion raises the  level of the red blood cells which carry oxygen, and this usually causes an energy increase. The nurse administering the transfusion will monitor you carefully for complications. HOME CARE INSTRUCTIONS  No special instructions are needed after a transfusion. You may find your energy is better. Speak with your caregiver about any limitations on  activity for underlying diseases you may have. SEEK MEDICAL CARE IF:  Your condition is not improving after your transfusion. You develop redness or irritation at the intravenous (IV) site. SEEK IMMEDIATE MEDICAL CARE IF:  Any of the following symptoms occur over the next 12 hours: Shaking chills. You have a temperature by mouth above 102 F (38.9 C), not controlled by medicine. Chest, back, or muscle pain. People around you feel you are not acting correctly or are confused. Shortness of breath or difficulty breathing. Dizziness and fainting. You get a rash or develop hives. You have a decrease in urine output. Your urine turns a dark color or changes to pink, red, or brown. Any of the following symptoms occur over the next 10 days: You have a temperature by mouth above 102 F (38.9 C), not controlled by medicine. Shortness of breath. Weakness after normal activity. The white part of the eye turns yellow (jaundice). You have a decrease in the amount of urine or are urinating less often. Your urine turns a dark color or changes to pink, red, or brown. Document Released: 08/18/2000 Document Revised: 11/13/2011 Document Reviewed: 04/06/2008 [facility] Patient Information 2014 [place], [place].  _______________________________________________________________________

## 2022-05-24 NOTE — Progress Notes (Signed)
Consent will need to be completed day of surgery. Patient had some questions for the surgeon.  COVID Vaccine Completed: yes x4  Date of COVID positive in last 90 days: no  PCP - Wendy McNeill, MD Cardiologist - n/a  Chest x-ray - n/a EKG - 03/20/22 Epic/chart Stress Test - yes years ago per pt ECHO - n/a Cardiac Cath - n/a Pacemaker/ICD device last checked: n/a Spinal Cord Stimulator: n/a  Bowel Prep - no  Sleep Study - n/a CPAP -   Fasting Blood Sugar - has not checked in years Checks Blood Sugar _____ times a day  Blood Thinner Instructions: n/a Aspirin Instructions: Last Dose:  Activity level: Can perform activities of daily living without stopping and without symptoms of chest pain or shortness of breath. No stairs per pt due to knee pain.   Anesthesia review: t wave abnormality on EKG  Patient denies shortness of breath, fever, cough and chest pain at PAT appointment  Patient verbalized understanding of instructions that were given to them at the PAT appointment. Patient was also instructed that they will need to review over the PAT instructions again at home before surgery.  

## 2022-05-25 ENCOUNTER — Encounter (HOSPITAL_COMMUNITY)
Admission: RE | Admit: 2022-05-25 | Discharge: 2022-05-25 | Disposition: A | Discharge status: Home / Self Care | Payer: Medicare PPO | Source: Ambulatory Visit | Attending: Orthopedic Surgery | Admitting: Orthopedic Surgery

## 2022-05-25 ENCOUNTER — Encounter (HOSPITAL_COMMUNITY): Payer: Self-pay

## 2022-05-25 VITALS — BP 118/78 | HR 55 | Temp 97.6°F | Resp 16 | Ht 64.0 in | Wt 184.0 lb

## 2022-05-25 DIAGNOSIS — Z01818 Encounter for other preprocedural examination: Secondary | ICD-10-CM

## 2022-05-25 DIAGNOSIS — I2510 Atherosclerotic heart disease of native coronary artery without angina pectoris: Secondary | ICD-10-CM

## 2022-05-25 LAB — BASIC METABOLIC PANEL
Anion gap: 7 (ref 5–15)
BUN: 20 mg/dL (ref 8–23)
CO2: 26 mmol/L (ref 22–32)
Calcium: 9.3 mg/dL (ref 8.9–10.3)
Chloride: 103 mmol/L (ref 98–111)
Creatinine, Ser: 0.8 mg/dL (ref 0.44–1.00)
GFR, Estimated: >60 mL/min (ref >60)
Glucose, Bld: 119 mg/dL — ABNORMAL HIGH (ref 70–99)
Potassium: 4.2 mmol/L (ref 3.5–5.1)
Sodium: 136 mmol/L (ref 135–145)

## 2022-05-25 LAB — CBC
HCT: 41.7 % (ref 36.0–46.0)
Hemoglobin: 13.2 g/dL (ref 12.0–15.0)
MCH: 28 pg (ref 26.0–34.0)
MCHC: 31.7 g/dL (ref 30.0–36.0)
MCV: 88.5 fL (ref 80.0–100.0)
Platelets: 209 10*3/uL (ref 150–400)
RBC: 4.71 MIL/uL (ref 3.87–5.11)
RDW: 13.2 % (ref 11.5–15.5)
WBC: 7.4 10*3/uL (ref 4.0–10.5)
nRBC: 0 % (ref 0.0–0.2)

## 2022-05-25 LAB — TYPE AND SCREEN
ABO/RH(D): O POS
Antibody Screen: NEGATIVE

## 2022-05-25 LAB — SURGICAL PCR SCREEN
MRSA, PCR: NEGATIVE
Staphylococcus aureus: NEGATIVE

## 2022-05-31 NOTE — Progress Notes (Unsigned)
Patient ID: [name], female    DOB: 11-Jun-1951, 71 y.o.   MRN: [id number]  HPI  F never smoker folowed for asthma, allergic rhinitis, complicated by hx OSA/ oral appliance, HBP, GERD PFT 05/30/10- FEV1 2.56/ 91%, FEV1/FVC 0.73, DLCO 84%  within normal limits. NPSG-04/21/14- Moderate obstructive sleep apnea, AHI 21/ hr, CPAP to 14, weight 185 lbs  -----------------------------------------------------------------   02/14/22-  71 year old female never smoker followed for Asthma, Allergic Rhinitis, OSA-failedCPAP/ oral appliance, complicated by HBP, GERD, OA,  -Singulair, Wixela100-50, albuterol HFA, Lunesta Body weight today  Covid vax- 6 Phizer [place], NP 01/02/22- Asthma exacerb. FENO wnl, Left on Wixela but considered change back to Trelegy. Labs 01/02/22- Hgb 11.7, EOS 0.2k wnl, Allergen panel H for dust mites, cat, dog IgE 69 -------Patient wants to talk about allergy testing. Patient feels like breathing is good states she does not sleep good due to sleep apnea and due to hip pain she is needing surgery. Has allergy skin testing scheduled at [facility] in [facility].  Pending hip replacement surgery.  Feels she is breathing relatively well today for her.  Rescue inhaler 2 or 3 times a week.  We again discussed reassessment of her sleep apnea.  She agrees to home sleep test.  06/01/22- 71 year old female never smoker followed for Asthma, Allergic Rhinitis, OSA-failedCPAP/ oral appliance, complicated by HBP, GERD, OA,  -Singulair, Trelegy100, albuterol HFA, Lunesta Body weight today  Covid vax- 6 Phizer- pending next update Flu and RSV at drug store [place], NP 01/02/22- Asthma exacerb. FENO wnl,  Pending revision R THA- repeated dislocation. HST on hold/ pending -----Pt f/u her asthma is going well she has no concerns.  Stable on Trelegy. No pulmonary concerns ahead of hip surgery. Husband having XRT for Merkle Cell cancer.  Review of Systems- see HPI  + =  positive Constitutional:   +  weight loss- see HPI, night sweats, fevers, chills, +fatigue, lassitude. HEENT:   +  headaches, difficulty swallowing, tooth/dental problems, sore throat,       No-  sneezing, itching, ear ache, +nasal congestion, post nasal drip,  CV:  No-   chest pain, orthopnea, PND, swelling in lower extremities, anasarca,  dizziness, palpitations Resp: No-   shortness of breath with exertion or at rest.              No-   productive cough,  No non-productive cough,  No- coughing up of blood.              No-   change in color of mucus. +wheezing.   Skin: No-   rash or lesions. GI:  +heartburn, indigestion, abdominal pain, nausea, vomiting, GU: . MS:  See HPI- + joint pain or swelling.   Neuro-     nothing unusual Psych:  No- change in mood or affect. No depression or anxiety.  No memory loss.  OBJ General- Alert, Oriented, Affect-appropriate, Distress- none acute. + Overweight Skin- rash-none, lesions- none, excoriation- none Lymphadenopathy- none Head- atraumatic            Eyes- Gross vision intact, PERRLA, conjunctivae clear            Ears- Hearing, canals-normal            Nose- mucus, no-polyps, erosion, perforation             Throat- Mallampati III , mucosa clear , drainage- none, tonsils- atrophic, own teeth Neck- flexible , trachea midline, no stridor , thyroid  nl, carotid no bruit Chest - symmetrical excursion , unlabored           Heart/CV- RRR , no murmur , no gallop  , no rub, nl s1 s2                           - JVD- none , edema- none, stasis changes- none, varices- none           Lung-  Wheeze-none cough- none , dullness-none, rub- none           Chest wall-  Abd-  Br/ Gen/ Rectal- Not done, not indicated Extrem- cyanosis- none, clubbing, none, atrophy- none, strength- nl Neuro- grossly intact to observation

## 2022-06-01 ENCOUNTER — Encounter (HOSPITAL_BASED_OUTPATIENT_CLINIC_OR_DEPARTMENT_OTHER): Payer: Self-pay | Admitting: Physical Therapy

## 2022-06-01 ENCOUNTER — Encounter: Payer: Self-pay | Admitting: Internal Medicine

## 2022-06-01 ENCOUNTER — Ambulatory Visit: Payer: Medicare PPO | Admitting: Internal Medicine

## 2022-06-01 DIAGNOSIS — J4540 Moderate persistent asthma, uncomplicated: Secondary | ICD-10-CM | POA: Diagnosis not present

## 2022-06-01 DIAGNOSIS — G4733 Obstructive sleep apnea (adult) (pediatric): Secondary | ICD-10-CM

## 2022-06-01 NOTE — Assessment & Plan Note (Signed)
Failed to tolerate treatments. Emphasize weight, sleep of back.

## 2022-06-01 NOTE — Patient Instructions (Signed)
We can continue current meds  Good luck with your hip surgery !  Please call if we can help

## 2022-06-01 NOTE — Assessment & Plan Note (Signed)
Stable uncomplicated on current meds Ok from pulmonary standpoint for hip surgery

## 2022-06-07 ENCOUNTER — Other Ambulatory Visit: Payer: Self-pay

## 2022-06-07 ENCOUNTER — Inpatient Hospital Stay (HOSPITAL_COMMUNITY): Payer: Medicare PPO | Admitting: Certified Registered"

## 2022-06-07 ENCOUNTER — Encounter (HOSPITAL_COMMUNITY): Payer: Self-pay | Admitting: Orthopedic Surgery

## 2022-06-07 ENCOUNTER — Encounter (HOSPITAL_COMMUNITY)
Admission: RE | Disposition: A | Discharge status: Home / Self Care | Payer: Self-pay | Source: Home / Self Care | Attending: Orthopedic Surgery

## 2022-06-07 ENCOUNTER — Inpatient Hospital Stay (HOSPITAL_COMMUNITY): Payer: Medicare PPO

## 2022-06-07 ENCOUNTER — Inpatient Hospital Stay (HOSPITAL_COMMUNITY)
Admission: RE | Admit: 2022-06-07 | Discharge: 2022-06-08 | DRG: 468 | Disposition: A | Discharge status: Home / Self Care | Payer: Medicare PPO | Attending: Orthopedic Surgery | Admitting: Orthopedic Surgery

## 2022-06-07 DIAGNOSIS — Y792 Prosthetic and other implants, materials and accessory orthopedic devices associated with adverse incidents: Secondary | ICD-10-CM | POA: Diagnosis present

## 2022-06-07 DIAGNOSIS — Z8261 Family history of arthritis: Secondary | ICD-10-CM | POA: Diagnosis not present

## 2022-06-07 DIAGNOSIS — K219 Gastro-esophageal reflux disease without esophagitis: Secondary | ICD-10-CM | POA: Diagnosis not present

## 2022-06-07 DIAGNOSIS — G4733 Obstructive sleep apnea (adult) (pediatric): Secondary | ICD-10-CM | POA: Diagnosis not present

## 2022-06-07 DIAGNOSIS — Z96642 Presence of left artificial hip joint: Secondary | ICD-10-CM | POA: Diagnosis not present

## 2022-06-07 DIAGNOSIS — Z800 Family history of malignant neoplasm of digestive organs: Secondary | ICD-10-CM

## 2022-06-07 DIAGNOSIS — Z888 Allergy status to other drugs, medicaments and biological substances status: Secondary | ICD-10-CM

## 2022-06-07 DIAGNOSIS — Z8249 Family history of ischemic heart disease and other diseases of the circulatory system: Secondary | ICD-10-CM | POA: Diagnosis not present

## 2022-06-07 DIAGNOSIS — E7800 Pure hypercholesterolemia, unspecified: Secondary | ICD-10-CM | POA: Diagnosis present

## 2022-06-07 DIAGNOSIS — Z96653 Presence of artificial knee joint, bilateral: Secondary | ICD-10-CM | POA: Diagnosis present

## 2022-06-07 DIAGNOSIS — G4730 Sleep apnea, unspecified: Secondary | ICD-10-CM

## 2022-06-07 DIAGNOSIS — M24451 Recurrent dislocation, right hip: Secondary | ICD-10-CM | POA: Diagnosis present

## 2022-06-07 DIAGNOSIS — I10 Essential (primary) hypertension: Secondary | ICD-10-CM | POA: Diagnosis not present

## 2022-06-07 DIAGNOSIS — J45909 Unspecified asthma, uncomplicated: Secondary | ICD-10-CM | POA: Diagnosis not present

## 2022-06-07 DIAGNOSIS — F32A Depression, unspecified: Secondary | ICD-10-CM | POA: Diagnosis present

## 2022-06-07 DIAGNOSIS — Z885 Allergy status to narcotic agent status: Secondary | ICD-10-CM

## 2022-06-07 DIAGNOSIS — T84020A Dislocation of internal right hip prosthesis, initial encounter: Secondary | ICD-10-CM | POA: Diagnosis not present

## 2022-06-07 DIAGNOSIS — M810 Age-related osteoporosis without current pathological fracture: Secondary | ICD-10-CM | POA: Diagnosis not present

## 2022-06-07 DIAGNOSIS — T84028A Dislocation of other internal joint prosthesis, initial encounter: Secondary | ICD-10-CM

## 2022-06-07 DIAGNOSIS — R102 Pelvic and perineal pain: Secondary | ICD-10-CM | POA: Diagnosis not present

## 2022-06-07 DIAGNOSIS — Z825 Family history of asthma and other chronic lower respiratory diseases: Secondary | ICD-10-CM

## 2022-06-07 DIAGNOSIS — Z96641 Presence of right artificial hip joint: Secondary | ICD-10-CM | POA: Diagnosis not present

## 2022-06-07 DIAGNOSIS — Z79899 Other long term (current) drug therapy: Secondary | ICD-10-CM

## 2022-06-07 DIAGNOSIS — M1990 Unspecified osteoarthritis, unspecified site: Secondary | ICD-10-CM | POA: Diagnosis not present

## 2022-06-07 HISTORY — PX: ACETABULAR REVISION: SHX5712

## 2022-06-07 SURGERY — REVISION, TOTAL ARTHROPLASTY, HIP, ACETABULAR COMPONENT
Anesthesia: Monitor Anesthesia Care | Site: Hip | Laterality: Right

## 2022-06-07 MED ORDER — ACETAMINOPHEN 325 MG PO TABS: 325.0000 mg | ORAL_TABLET | Freq: Four times a day (QID) | ORAL | Status: DC | PRN

## 2022-06-07 MED ORDER — DEXAMETHASONE SODIUM PHOSPHATE 10 MG/ML IJ SOLN
8.0000 mg | Freq: Once | INTRAMUSCULAR | Status: AC
  Administered 2022-06-07: 10 mg via INTRAVENOUS

## 2022-06-07 MED ORDER — BISACODYL 10 MG RE SUPP: 10.0000 mg | Freq: Every day | RECTAL | Status: DC | PRN

## 2022-06-07 MED ORDER — OXYCODONE HCL 5 MG/5ML PO SOLN: 5.0000 mg | Freq: Once | ORAL | Status: DC | PRN

## 2022-06-07 MED ORDER — ONDANSETRON HCL 4 MG/2ML IJ SOLN
INTRAMUSCULAR | Status: DC | PRN
  Administered 2022-06-07: 4 mg via INTRAVENOUS

## 2022-06-07 MED ORDER — ACETAMINOPHEN 10 MG/ML IV SOLN
1000.0000 mg | INTRAVENOUS | Status: AC
  Administered 2022-06-07: 1000 mg via INTRAVENOUS
  Filled 2022-06-07: qty 100

## 2022-06-07 MED ORDER — BUPIVACAINE-EPINEPHRINE (PF) 0.25% -1:200000 IJ SOLN
INTRAMUSCULAR | Status: AC
  Filled 2022-06-07: qty 30

## 2022-06-07 MED ORDER — TRAMADOL HCL 50 MG PO TABS
50.0000 mg | ORAL_TABLET | Freq: Four times a day (QID) | ORAL | Status: DC | PRN
  Administered 2022-06-07: 100 mg via ORAL
  Filled 2022-06-07: qty 2

## 2022-06-07 MED ORDER — POLYETHYLENE GLYCOL 3350 17 G PO PACK: 17.0000 g | PACK | Freq: Every day | ORAL | Status: DC | PRN

## 2022-06-07 MED ORDER — POVIDONE-IODINE 10 % EX SWAB
2.0000 | Freq: Once | CUTANEOUS | Status: AC
  Administered 2022-06-07: 2 via TOPICAL

## 2022-06-07 MED ORDER — PANTOPRAZOLE SODIUM 40 MG PO TBEC
40.0000 mg | DELAYED_RELEASE_TABLET | Freq: Every day | ORAL | Status: DC
  Administered 2022-06-08: 40 mg via ORAL
  Filled 2022-06-07: qty 1

## 2022-06-07 MED ORDER — PHENOL 1.4 % MT LIQD: 1.0000 | OROMUCOSAL | Status: DC | PRN

## 2022-06-07 MED ORDER — UMECLIDINIUM BROMIDE 62.5 MCG/ACT IN AEPB
1.0000 | INHALATION_SPRAY | Freq: Every day | RESPIRATORY_TRACT | Status: DC
  Administered 2022-06-07: 1 via RESPIRATORY_TRACT
  Filled 2022-06-07: qty 7

## 2022-06-07 MED ORDER — AMLODIPINE BESYLATE 5 MG PO TABS
5.0000 mg | ORAL_TABLET | Freq: Every day | ORAL | Status: DC
  Administered 2022-06-08: 5 mg via ORAL
  Filled 2022-06-07: qty 1

## 2022-06-07 MED ORDER — LACTATED RINGERS IV SOLN
INTRAVENOUS | Status: DC
Start: 2022-06-07 — End: 2022-06-07

## 2022-06-07 MED ORDER — CLONIDINE HCL 0.1 MG PO TABS: 0.1000 mg | ORAL_TABLET | Freq: Every evening | ORAL | Status: DC

## 2022-06-07 MED ORDER — ASPIRIN 325 MG PO TBEC
325.0000 mg | DELAYED_RELEASE_TABLET | Freq: Two times a day (BID) | ORAL | Status: DC
  Administered 2022-06-08: 325 mg via ORAL
  Filled 2022-06-07: qty 1

## 2022-06-07 MED ORDER — DEXAMETHASONE SODIUM PHOSPHATE 10 MG/ML IJ SOLN
INTRAMUSCULAR | Status: AC
  Filled 2022-06-07: qty 1

## 2022-06-07 MED ORDER — MAGNESIUM CITRATE PO SOLN: 1.0000 | Freq: Once | ORAL | Status: DC | PRN

## 2022-06-07 MED ORDER — METOCLOPRAMIDE HCL 5 MG/ML IJ SOLN: 5.0000 mg | Freq: Three times a day (TID) | INTRAMUSCULAR | Status: DC | PRN

## 2022-06-07 MED ORDER — FENTANYL CITRATE (PF) 100 MCG/2ML IJ SOLN
INTRAMUSCULAR | Status: DC | PRN
  Administered 2022-06-07 (×2): 50 ug via INTRAVENOUS

## 2022-06-07 MED ORDER — HYDROCODONE-ACETAMINOPHEN 7.5-325 MG PO TABS
1.0000 | ORAL_TABLET | ORAL | Status: DC | PRN
  Administered 2022-06-08 (×2): 2 via ORAL
  Filled 2022-06-07 (×2): qty 2

## 2022-06-07 MED ORDER — BUPIVACAINE-EPINEPHRINE (PF) 0.25% -1:200000 IJ SOLN
INTRAMUSCULAR | Status: DC | PRN
  Administered 2022-06-07: 30 mL

## 2022-06-07 MED ORDER — ONDANSETRON HCL 4 MG/2ML IJ SOLN: 4.0000 mg | Freq: Four times a day (QID) | INTRAMUSCULAR | Status: DC | PRN

## 2022-06-07 MED ORDER — ONDANSETRON HCL 4 MG PO TABS: 4.0000 mg | ORAL_TABLET | Freq: Four times a day (QID) | ORAL | Status: DC | PRN

## 2022-06-07 MED ORDER — CEFAZOLIN SODIUM-DEXTROSE 2-4 GM/100ML-% IV SOLN
2.0000 g | Freq: Four times a day (QID) | INTRAVENOUS | Status: AC
  Administered 2022-06-07 – 2022-06-08 (×2): 2 g via INTRAVENOUS
  Filled 2022-06-07 (×2): qty 100

## 2022-06-07 MED ORDER — METHOCARBAMOL 500 MG PO TABS
500.0000 mg | ORAL_TABLET | Freq: Four times a day (QID) | ORAL | Status: DC | PRN
  Administered 2022-06-07 – 2022-06-08 (×2): 500 mg via ORAL
  Filled 2022-06-07 (×2): qty 1

## 2022-06-07 MED ORDER — SODIUM CHLORIDE 0.9 % IV SOLN: INTRAVENOUS | Status: DC

## 2022-06-07 MED ORDER — STERILE WATER FOR IRRIGATION IR SOLN
Status: DC | PRN
  Administered 2022-06-07: 2000 mL

## 2022-06-07 MED ORDER — FLUTICASONE FUROATE-VILANTEROL 100-25 MCG/ACT IN AEPB
1.0000 | INHALATION_SPRAY | Freq: Every day | RESPIRATORY_TRACT | Status: DC
  Administered 2022-06-07: 1 via RESPIRATORY_TRACT
  Filled 2022-06-07: qty 28

## 2022-06-07 MED ORDER — HYDROCODONE-ACETAMINOPHEN 5-325 MG PO TABS
1.0000 | ORAL_TABLET | ORAL | Status: DC | PRN
  Administered 2022-06-07 – 2022-06-08 (×2): 2 via ORAL
  Filled 2022-06-07 (×2): qty 2

## 2022-06-07 MED ORDER — METOCLOPRAMIDE HCL 5 MG PO TABS: 5.0000 mg | ORAL_TABLET | Freq: Three times a day (TID) | ORAL | Status: DC | PRN

## 2022-06-07 MED ORDER — MORPHINE SULFATE (PF) 2 MG/ML IV SOLN: 0.5000 mg | INTRAVENOUS | Status: DC | PRN

## 2022-06-07 MED ORDER — PROPOFOL 500 MG/50ML IV EMUL
INTRAVENOUS | Status: DC | PRN
  Administered 2022-06-07: 75 ug/kg/min via INTRAVENOUS

## 2022-06-07 MED ORDER — DEXAMETHASONE SODIUM PHOSPHATE 10 MG/ML IJ SOLN
10.0000 mg | Freq: Once | INTRAMUSCULAR | Status: DC
  Filled 2022-06-07: qty 1

## 2022-06-07 MED ORDER — BUPIVACAINE IN DEXTROSE 0.75-8.25 % IT SOLN
INTRATHECAL | Status: DC | PRN
  Administered 2022-06-07: 2 mL via INTRATHECAL

## 2022-06-07 MED ORDER — CEFAZOLIN SODIUM-DEXTROSE 2-4 GM/100ML-% IV SOLN
2.0000 g | INTRAVENOUS | Status: AC
  Administered 2022-06-07: 2 g via INTRAVENOUS
  Filled 2022-06-07: qty 100

## 2022-06-07 MED ORDER — EPHEDRINE SULFATE-NACL 50-0.9 MG/10ML-% IV SOSY
PREFILLED_SYRINGE | INTRAVENOUS | Status: DC | PRN
  Administered 2022-06-07 (×5): 5 mg via INTRAVENOUS

## 2022-06-07 MED ORDER — ORAL CARE MOUTH RINSE: 15.0000 mL | Freq: Once | OROMUCOSAL | Status: AC

## 2022-06-07 MED ORDER — DOCUSATE SODIUM 100 MG PO CAPS
100.0000 mg | ORAL_CAPSULE | Freq: Two times a day (BID) | ORAL | Status: DC
  Administered 2022-06-07 – 2022-06-08 (×2): 100 mg via ORAL
  Filled 2022-06-07 (×2): qty 1

## 2022-06-07 MED ORDER — TRANEXAMIC ACID-NACL 1000-0.7 MG/100ML-% IV SOLN
1000.0000 mg | INTRAVENOUS | Status: AC
  Administered 2022-06-07: 1000 mg via INTRAVENOUS
  Filled 2022-06-07: qty 100

## 2022-06-07 MED ORDER — ATORVASTATIN CALCIUM 40 MG PO TABS
40.0000 mg | ORAL_TABLET | Freq: Every day | ORAL | Status: DC
  Filled 2022-06-07: qty 1

## 2022-06-07 MED ORDER — PROPOFOL 1000 MG/100ML IV EMUL
INTRAVENOUS | Status: AC
  Filled 2022-06-07: qty 100

## 2022-06-07 MED ORDER — FENTANYL CITRATE PF 50 MCG/ML IJ SOSY: 25.0000 ug | PREFILLED_SYRINGE | INTRAMUSCULAR | Status: DC | PRN

## 2022-06-07 MED ORDER — PROPOFOL 10 MG/ML IV BOLUS
INTRAVENOUS | Status: DC | PRN
  Administered 2022-06-07 (×2): 20 mg via INTRAVENOUS

## 2022-06-07 MED ORDER — 0.9 % SODIUM CHLORIDE (POUR BTL) OPTIME
TOPICAL | Status: DC | PRN
  Administered 2022-06-07: 1000 mL

## 2022-06-07 MED ORDER — ZOLPIDEM TARTRATE 5 MG PO TABS: 5.0000 mg | ORAL_TABLET | Freq: Every evening | ORAL | Status: DC | PRN

## 2022-06-07 MED ORDER — CHLORHEXIDINE GLUCONATE 0.12 % MT SOLN
15.0000 mL | Freq: Once | OROMUCOSAL | Status: AC
  Administered 2022-06-07: 15 mL via OROMUCOSAL

## 2022-06-07 MED ORDER — MENTHOL 3 MG MT LOZG: 1.0000 | LOZENGE | OROMUCOSAL | Status: DC | PRN

## 2022-06-07 MED ORDER — FENTANYL CITRATE (PF) 100 MCG/2ML IJ SOLN
INTRAMUSCULAR | Status: AC
  Filled 2022-06-07: qty 2

## 2022-06-07 MED ORDER — EPHEDRINE 5 MG/ML INJ
INTRAVENOUS | Status: AC
  Filled 2022-06-07: qty 5

## 2022-06-07 MED ORDER — ONDANSETRON HCL 4 MG/2ML IJ SOLN
INTRAMUSCULAR | Status: AC
  Filled 2022-06-07: qty 2

## 2022-06-07 MED ORDER — LACTATED RINGERS IV SOLN: INTRAVENOUS | Status: DC

## 2022-06-07 MED ORDER — ALBUTEROL SULFATE (2.5 MG/3ML) 0.083% IN NEBU: 3.0000 mL | INHALATION_SOLUTION | RESPIRATORY_TRACT | Status: DC | PRN

## 2022-06-07 MED ORDER — METHOCARBAMOL 1000 MG/10ML IJ SOLN: 500.0000 mg | Freq: Four times a day (QID) | INTRAVENOUS | Status: DC | PRN

## 2022-06-07 MED ORDER — OXYCODONE HCL 5 MG PO TABS: 5.0000 mg | ORAL_TABLET | Freq: Once | ORAL | Status: DC | PRN

## 2022-06-07 SURGICAL SUPPLY — 59 items
BAG COUNTER SPONGE SURGICOUNT (BAG) IMPLANT
BAG SPEC THK2 15X12 ZIP CLS (MISCELLANEOUS) ×2
BAG SPNG CNTER NS LX DISP (BAG) ×1
BAG ZIPLOCK 12X15 (MISCELLANEOUS) ×2 IMPLANT
BALL HIP 32MM PLUS 3 (Hips) IMPLANT
BIT DRILL 2.8X128 (BIT) ×1 IMPLANT
BLADE EXTENDED COATED 6.5IN (ELECTRODE) ×1 IMPLANT
BLADE SAW SGTL 73X25 THK (BLADE) ×1 IMPLANT
COVER SURGICAL LIGHT HANDLE (MISCELLANEOUS) ×1 IMPLANT
DRAPE INCISE IOBAN 66X45 STRL (DRAPES) ×1 IMPLANT
DRAPE ORTHO SPLIT 77X108 STRL (DRAPES) ×2
DRAPE POUCH INSTRU U-SHP 10X18 (DRAPES) ×1 IMPLANT
DRAPE SHEET LG 3/4 BI-LAMINATE (DRAPES) ×1 IMPLANT
DRAPE SURG ORHT 6 SPLT 77X108 (DRAPES) ×2 IMPLANT
DRAPE U-SHAPE 47X51 STRL (DRAPES) ×1 IMPLANT
DRESSING MEPILEX FLEX 4X4 (GAUZE/BANDAGES/DRESSINGS) ×2 IMPLANT
DRSG EMULSION OIL 3X16 NADH (GAUZE/BANDAGES/DRESSINGS) ×1 IMPLANT
DRSG MEPILEX FLEX 4X4 (GAUZE/BANDAGES/DRESSINGS) ×2
DRSG MEPILEX POST OP 4X8 (GAUZE/BANDAGES/DRESSINGS) ×1 IMPLANT
DURAPREP 26ML APPLICATOR (WOUND CARE) ×1 IMPLANT
ELECT REM PT RETURN 15FT ADLT (MISCELLANEOUS) ×1 IMPLANT
EVACUATOR 1/8 PVC DRAIN (DRAIN) ×1 IMPLANT
FACESHIELD WRAPAROUND (MASK) ×4 IMPLANT
FACESHIELD WRAPAROUND OR TEAM (MASK) ×4 IMPLANT
GAUZE SPONGE 4X4 12PLY STRL (GAUZE/BANDAGES/DRESSINGS) ×1 IMPLANT
GLOVE BIO SURGEON STRL SZ 6.5 (GLOVE) ×2 IMPLANT
GLOVE BIO SURGEON STRL SZ7 (GLOVE) ×1 IMPLANT
GLOVE BIO SURGEON STRL SZ8 (GLOVE) ×2 IMPLANT
GLOVE BIOGEL PI IND STRL 7.0 (GLOVE) ×2 IMPLANT
GLOVE BIOGEL PI IND STRL 8 (GLOVE) ×3 IMPLANT
GOWN STRL REUS W/ TWL LRG LVL3 (GOWN DISPOSABLE) ×2 IMPLANT
GOWN STRL REUS W/TWL LRG LVL3 (GOWN DISPOSABLE) ×2
HIP BALL 32MM PLUS 3 (Hips) ×1 IMPLANT
IMMOBILIZER KNEE 20 (SOFTGOODS) ×1
IMMOBILIZER KNEE 20 THIGH 36 (SOFTGOODS) IMPLANT
KIT BASIN OR (CUSTOM PROCEDURE TRAY) ×1 IMPLANT
KIT TURNOVER KIT A (KITS) IMPLANT
LINER PINN CONS 32X54 (Hips) IMPLANT
LINER PINNACLE 32X54 (Hips) ×1 IMPLANT
MANIFOLD NEPTUNE II (INSTRUMENTS) ×1 IMPLANT
NS IRRIG 1000ML POUR BTL (IV SOLUTION) ×1 IMPLANT
PACK TOTAL JOINT (CUSTOM PROCEDURE TRAY) ×1 IMPLANT
PASSER SUT SWANSON 36MM LOOP (INSTRUMENTS) ×1 IMPLANT
PROTECTOR NERVE ULNAR (MISCELLANEOUS) ×1 IMPLANT
STAPLER VISISTAT 35W (STAPLE) ×1 IMPLANT
SUCTION FRAZIER HANDLE 12FR (TUBING) ×1
SUCTION TUBE FRAZIER 12FR DISP (TUBING) ×1 IMPLANT
SUT ETHIBOND NAB CT1 #1 30IN (SUTURE) ×2 IMPLANT
SUT MNCRL AB 4-0 PS2 18 (SUTURE) ×1 IMPLANT
SUT STRATAFIX 0 PDS 27 VIOLET (SUTURE) ×1
SUT VIC AB 2-0 CT1 27 (SUTURE) ×3
SUT VIC AB 2-0 CT1 TAPERPNT 27 (SUTURE) ×3 IMPLANT
SUTURE STRATFX 0 PDS 27 VIOLET (SUTURE) ×1 IMPLANT
SYR 30ML LL (SYRINGE) IMPLANT
SYR 50ML LL SCALE MARK (SYRINGE) ×1 IMPLANT
TOWEL OR 17X26 10 PK STRL BLUE (TOWEL DISPOSABLE) ×2 IMPLANT
TOWEL OR NON WOVEN STRL DISP B (DISPOSABLE) ×1 IMPLANT
TRAY FOLEY MTR SLVR 14FR STAT (SET/KITS/TRAYS/PACK) IMPLANT
WATER STERILE IRR 1000ML POUR (IV SOLUTION) IMPLANT

## 2022-06-07 NOTE — Anesthesia Preprocedure Evaluation (Signed)
Anesthesia Evaluation  Patient identified by MRN, date of birth, ID band Patient awake    Reviewed: Allergy & Precautions, H&P , NPO status , Patient's Chart, lab work & pertinent test results  Airway Mallampati: II   Neck ROM: full    Dental   Pulmonary asthma , sleep apnea ,    breath sounds clear to auscultation       Cardiovascular hypertension,  Rhythm:regular Rate:Normal     Neuro/Psych PSYCHIATRIC DISORDERS Depression  Neuromuscular disease    GI/Hepatic hiatal hernia, GERD  ,  Endo/Other    Renal/GU      Musculoskeletal  (+) Arthritis ,   Abdominal   Peds  Hematology   Anesthesia Other Findings   Reproductive/Obstetrics                             Anesthesia Physical Anesthesia Plan  ASA: 3  Anesthesia Plan: MAC and Spinal   Post-op Pain Management:    Induction: Intravenous  PONV Risk Score and Plan: 2 and Propofol infusion and Treatment may vary due to age or medical condition  Airway Management Planned: Simple Face Mask  Additional Equipment:   Intra-op Plan:   Post-operative Plan:   Informed Consent: I have reviewed the patients History and Physical, chart, labs and discussed the procedure including the risks, benefits and alternatives for the proposed anesthesia with the patient or authorized representative who has indicated his/her understanding and acceptance.     Dental advisory given  Plan Discussed with: CRNA, Anesthesiologist and Surgeon  Anesthesia Plan Comments:         Anesthesia Quick Evaluation

## 2022-06-07 NOTE — Op Note (Unsigned)
Theresa, Cochran MEDICAL RECORD NO: [id number] ACCOUNT NO: [id number] DATE OF BIRTH: May 29, 1951 FACILITY: [facility]ATION: [id number] PHYSICIAN: Dione Plover. Daegan Arizmendi, MD  Operative Report   DATE OF PROCEDURE: 06/07/2022   PREOPERATIVE DIAGNOSIS:  Recurrent dislocations, right hip.  POSTOPERATIVE DIAGNOSIS:  Recurrent dislocations, right hip.  PROCEDURE:  Right hip bearing surface revision to constrained liner.  SURGEON:  Dione Plover. Dakiyah Heinke, MD  ASSISTANT:  Drenda Freeze, PA-C  ANESTHESIA: Spinal.  ESTIMATED BLOOD LOSS:  150 mL.   DRAINS:  None.  COMPLICATIONS:  None.  CONDITION:  Stable to recovery.  BRIEF CLINICAL NOTE:  The patient is a 71 year old female who had a right total hip arthroplasty done several years ago.  She did fine until recently where she has had a recurrent dislocation episodes.  She presents now for acetabular liner versus total  hip arthroplasty revision.  DESCRIPTION OF PROCEDURE:  After successful administration of spinal anesthetic, the patient was placed in the left lateral decubitus position with the right side up and held with a hip positioner.  Right lower extremity was isolated from her perineum  with plastic drapes and prepped and draped in the usual sterile fashion.  A short posterolateral incision was made with a 10 blade through subcutaneous tissue to the level of the fascia lata, which was incised in line with the incision.  Sciatic nerve  was palpated and protected and then the posterior pseudocapsule was incised and elevated off the femur.  We were then able to enter the joint.  There was a small amount of metallic debris in the joint.  The hip was dislocated and then the femoral head is  disimpacted from the femoral neck. The femoral neck shows a tiny notch at the base, but no other abnormal markings.  It is well fixed and in excellent position.  The femur was then retracted anteriorly to gain acetabular exposure.  Soft tissue was removed from the  rim of the acetabulum.  The extraction device was used to dislodge the acetabular liner from the acetabular shell.  This was a metal liner for a metal-on-metal hip replacement.  The liner is removed.  The shell was in  great position and is well fixed.  Prior to this, I placed the hip through range of motion.  There was no impingement.  I felt that her interest would be best served with a constrained liner and that we did not need to change any of the metal component  since they were well fixed and in excellent position and there was no impingement on range of motion and thus placed a constrained liner for a 54 mm Pinnacle acetabular shell.  The liner was impacted into the shell.  All soft tissues were retracted from  the rim of the cup and then the 32+3 metal femoral head was placed onto the S-ROM neck.  We retracted the femur to make sure that the tissue would not get in the way of the reduction.  32+3 metal head was placed and the locking ring passed around the  femoral neck.  We then reduced the hip with outstanding stability.  Locking ring was then placed around the rim of the acetabular liner and impacted.  This locked into place.  Stability was excellent with this construct.  Wound was then copiously  irrigated with saline solution.  The short rotators and capsule were then reattached to the femur through drill holes with Ethibond suture.  Fascia lata was closed with a running 0 Stratafix suture.  Approximately 30 mL of 0.25% Marcaine with epinephrine  injected into the subcutaneous tissues.  Subcutaneous was then closed with interrupted 2-0 Vicryl and subcuticular running 4-0 Monocryl.  The incisions cleaned and dried and Steri-Strips and sterile dressing applied.  She is subsequently awakened and  transported to recovery in stable condition.     SUJ D: 06/07/2022 2:17:33 pm T: 06/07/2022 11:22:00 pm  JOB: [id number]/ [id number]

## 2022-06-07 NOTE — Anesthesia Procedure Notes (Signed)
Spinal  Patient location during procedure: OR End time: 06/07/2022 12:51 PM Reason for block: surgical anesthesia Staffing Performed: resident/CRNA  Resident/CRNA: Noralyn Pick D, CRNA Performed by: Eilee Schader, Clinical cytogeneticist D, CRNA Authorized by: Albertha Ghee, MD   Preanesthetic Checklist Completed: patient identified, IV checked, site marked, risks and benefits discussed, surgical consent, monitors and equipment checked, pre-op evaluation and timeout performed Spinal Block Patient position: sitting Prep: DuraPrep Patient monitoring: heart rate, continuous pulse ox and blood pressure Approach: midline Location: L3-4 Injection technique: single-shot Needle Needle type: Pencan  Needle gauge: 24 G Needle length: 9 cm Assessment Sensory level: T6 Events: CSF return

## 2022-06-07 NOTE — Interval H&P Note (Signed)
History and Physical Interval Note:  06/07/2022 11:25 AM  [name]  has presented today for surgery, with the diagnosis of recurrent dislocation right hip.  The various methods of treatment have been discussed with the patient and family. After consideration of risks, benefits and other options for treatment, the patient has consented to  Procedure(s): Right hip constrained liner versus dual mobility cup (Right) as a surgical intervention.  The patient's history has been reviewed, patient examined, no change in status, stable for surgery.  I have reviewed the patient's chart and labs.  Questions were answered to the patient's satisfaction.     Pilar Plate Alon Mazor

## 2022-06-07 NOTE — Brief Op Note (Signed)
06/07/2022  2:11 PM  PATIENT:  [name]  71 y.o. female  PRE-OPERATIVE DIAGNOSIS:  recurrent dislocation right hip  POST-OPERATIVE DIAGNOSIS:  recurrent dislocation right hip  PROCEDURE:  Procedure(s): Right hip constrained liner versus dual mobility cup (Right)  SURGEON:  Surgeon(s) and Role:    Gaynelle Arabian, MD - Primary  PHYSICIAN ASSISTANT:   ASSISTANTS: Jaynie Bream, PA-C   ANESTHESIA:   spinal  EBL:  150 ml  BLOOD ADMINISTERED:none  DRAINS: none   LOCAL MEDICATIONS USED:  MARCAINE     COUNTS:  YES  TOURNIQUET:  * No tourniquets in log *  DICTATION: .Other Dictation: Dictation Number [id number]  PLAN OF CARE: Admit to inpatient   PATIENT DISPOSITION:  PACU - hemodynamically stable.

## 2022-06-07 NOTE — Plan of Care (Signed)
  Problem: Education: Goal: Knowledge of the prescribed therapeutic regimen will improve Outcome: Progressing   Problem: Activity: Goal: Ability to avoid complications of mobility impairment will improve Outcome: Progressing   Problem: Clinical Measurements: Goal: Postoperative complications will be avoided or minimized Outcome: Progressing   Problem: Skin Integrity: Goal: Will show signs of wound healing Outcome: Progressing   Problem: Education: Goal: Knowledge of General Education information will improve Description: Including pain rating scale, medication(s)/side effects and non-pharmacologic comfort measures Outcome: Progressing   Problem: Activity: Goal: Risk for activity intolerance will decrease Outcome: Progressing

## 2022-06-07 NOTE — Anesthesia Postprocedure Evaluation (Signed)
Anesthesia Post Note  Patient: [name]  Procedure(s) Performed: Right hip constrained liner versus dual mobility cup (Right: Hip)     Patient location during evaluation: PACU Anesthesia Type: MAC Level of consciousness: oriented and awake and alert Pain management: pain level controlled Vital Signs Assessment: post-procedure vital signs reviewed and stable Respiratory status: spontaneous breathing, respiratory function stable and patient connected to nasal cannula oxygen Cardiovascular status: blood pressure returned to baseline and stable Postop Assessment: no headache, no backache and no apparent nausea or vomiting Anesthetic complications: no   No notable events documented.  Last Vitals:  Vitals:   06/07/22 1645 06/07/22 1732  BP: 131/66 130/61  Pulse: (!) 56 (!) 54  Resp: 12 18  Temp:    SpO2: 97% 97%    Last Pain:  Vitals:   06/07/22 1645  TempSrc:   PainSc: 0-No pain                 Kemonte Ullman S

## 2022-06-07 NOTE — Transfer of Care (Signed)
Immediate Anesthesia Transfer of Care Note  Patient: [name]  Procedure(s) Performed: Right hip constrained liner versus dual mobility cup (Right: Hip)  Patient Location: PACU  Anesthesia Type:Spinal  Level of Consciousness: awake, alert  and oriented  Airway & Oxygen Therapy: Patient Spontanous Breathing and Patient connected to face mask oxygen  Post-op Assessment: Report given to RN and Post -op Vital signs reviewed and stable  Post vital signs: Reviewed and stable  Last Vitals:  Vitals Value Taken Time  BP 129/68 06/07/22 1436  Temp    Pulse 57 06/07/22 1438  Resp 10 06/07/22 1438  SpO2 100 % 06/07/22 1438  Vitals shown include unvalidated device data.  Last Pain:  Vitals:   06/07/22 1221  TempSrc: Oral  PainSc:       Patients Stated Pain Goal: 3 (20/80/22 3361)  Complications: No notable events documented.

## 2022-06-07 NOTE — Discharge Instructions (Signed)
 Dr. Frank Aluisio Total Joint Specialist [facility] [address] [place], [place] [address] [phone]  POSTERIOR TOTAL HIP REPLACEMENT POSTOPERATIVE DIRECTIONS  Hip Rehabilitation, Guidelines Following Surgery  The results of a hip operation are greatly improved after range of motion and muscle strengthening exercises. Follow all safety measures which are given to protect your hip. If any of these exercises cause increased pain or swelling in your joint, decrease the amount until you are comfortable again. Then slowly increase the exercises. Call your caregiver if you have problems or questions.   BLOOD CLOT PREVENTION Take a 325 mg Aspirin two times a day for three weeks following surgery. Then take an 81 mg Aspirin once a day for three weeks. Then discontinue Aspirin. You may resume your vitamins/supplements upon discharge from the hospital. Do not take any NSAIDs (Advil, Aleve, Ibuprofen, Meloxicam, etc.) until you have discontinued the 325 mg Aspirin.  PRECAUTIONS (6 WEEKS FOLLOWING SURGERY) Do not bend your hip past a 90 degree angle Do not cross your legs. Don't twist your hip inwards- keep knees and toes pointed upwards   HOME CARE INSTRUCTIONS  Remove items at home which could result in a fall. This includes throw rugs or furniture in walking pathways.  ICE to the affected hip every three hours for 30 minutes at a time and then as needed for pain and swelling.  Continue to use ice on the hip for pain and swelling from surgery. You may notice swelling that will progress down to the foot and ankle.  This is normal after surgery.  Elevate the leg when you are not up walking on it.   Continue to use the breathing machine which will help keep your temperature down.  It is common for your temperature to cycle up and down following surgery, especially at night when you are not up moving around and exerting yourself.  The breathing machine keeps your lungs expanded  and your temperature down.  DIET You may resume your previous home diet once your are discharged from the hospital.  DRESSING / WOUND CARE / SHOWERING Keep the surgical dressing until follow up.  The dressing is water proof, so you can shower without any extra covering.  IF THE DRESSING FALLS OFF or the wound gets wet inside, change the dressing with sterile gauze.  Please use good hand washing techniques before changing the dressing.  Do not use any lotions or creams on the incision until instructed by your surgeon.   You may start showering once you are discharged home but do not submerge the incision under water. Just pat the incision dry and apply a dry gauze dressing on daily. Change the surgical dressing daily and reapply a dry dressing each time.  ACTIVITY Walk with your walker as instructed. Use walker as long as suggested by your caregivers. Avoid periods of inactivity such as sitting longer than an hour when not asleep. This helps prevent blood clots.  You may resume a sexual relationship in one month or when given the OK by your doctor.  You may return to work once you are cleared by your doctor.  Do not drive a car for 6 weeks or until released by you surgeon.  Do not drive while taking narcotics.  WEIGHT BEARING Weight bearing as tolerated with assist device (walker, cane, etc) as directed, use it as long as suggested by your surgeon or therapist, typically at least 4-6 weeks.  POSTOPERATIVE CONSTIPATION PROTOCOL Constipation - defined medically as   fewer than three stools per week and severe constipation as less than one stool per week.  One of the most common issues patients have following surgery is constipation.  Even if you have a regular bowel pattern at home, your normal regimen is likely to be disrupted due to multiple reasons following surgery.  Combination of anesthesia, postoperative narcotics, change in appetite and fluid intake all can affect your bowels.  In order  to avoid complications following surgery, here are some recommendations in order to help you during your recovery period.  Colace (docusate) - Pick up an over-the-counter form of Colace or another stool softener and take twice a day as long as you are requiring postoperative pain medications.  Take with a full glass of water daily.  If you experience loose stools or diarrhea, hold the colace until you stool forms back up.  If your symptoms do not get better within 1 week or if they get worse, check with your doctor.  Dulcolax (bisacodyl) - Pick up over-the-counter and take as directed by the product packaging as needed to assist with the movement of your bowels.  Take with a full glass of water.  Use this product as needed if not relieved by Colace only.   MiraLax (polyethylene glycol) - Pick up over-the-counter to have on hand.  MiraLax is a solution that will increase the amount of water in your bowels to assist with bowel movements.  Take as directed and can mix with a glass of water, juice, soda, coffee, or tea.  Take if you go more than two days without a movement. Do not use MiraLax more than once per day. Call your doctor if you are still constipated or irregular after using this medication for 7 days in a row.  If you continue to have problems with postoperative constipation, please contact the office for further assistance and recommendations.  If you experience "the worst abdominal pain ever" or develop nausea or vomiting, please contact the office immediatly for further recommendations for treatment.  ITCHING  If you experience itching with your medications, try taking only a single pain pill, or even half a pain pill at a time.  You can also use Benadryl over the counter for itching or also to help with sleep.   TED HOSE STOCKINGS Wear the elastic stockings on both legs for three weeks following surgery during the day but you may remove then at night for sleeping.  MEDICATIONS See your  medication summary on the "After Visit Summary" that the nursing staff will review with you prior to discharge.  You may have some home medications which will be placed on hold until you complete the course of blood thinner medication.  It is important for you to complete the blood thinner medication as prescribed by your surgeon.  Continue your approved medications as instructed at time of discharge.  PRECAUTIONS If you experience chest pain or shortness of breath - call 911 immediately for transfer to the hospital emergency department.  If you develop a fever greater that 101 F, purulent drainage from wound, increased redness or drainage from wound, foul odor from the wound/dressing, or calf pain - CONTACT YOUR SURGEON.                                                   FOLLOW-UP APPOINTMENTS Make sure   you keep all of your appointments after your operation with your surgeon and caregivers. You should call the office at the above phone number and make an appointment for approximately two weeks after the date of your surgery or on the date instructed by your surgeon outlined in the "After Visit Summary".  RANGE OF MOTION AND STRENGTHENING EXERCISES  These exercises are designed to help you keep full movement of your hip joint. Follow your caregiver's or physical therapist's instructions. Perform all exercises about fifteen times, three times per day or as directed. Exercise both hips, even if you have had only one joint replacement. These exercises can be done on a training (exercise) mat, on the floor, on a table or on a bed. Use whatever works the best and is most comfortable for you. Use music or television while you are exercising so that the exercises are a pleasant break in your day. This will make your life better with the exercises acting as a break in routine you can look forward to.  Lying on your back, slowly slide your foot toward your buttocks, raising your knee up off the floor. Then slowly  slide your foot back down until your leg is straight again.  Lying on your back spread your legs as far apart as you can without causing discomfort.  Lying on your side, raise your upper leg and foot straight up from the floor as far as is comfortable. Slowly lower the leg and repeat.  Lying on your back, tighten up the muscle in the front of your thigh (quadriceps muscles). You can do this by keeping your leg straight and trying to raise your heel off the floor. This helps strengthen the largest muscle supporting your knee.  Lying on your back, tighten up the muscles of your buttocks both with the legs straight and with the knee bent at a comfortable angle while keeping your heel on the floor.   IF YOU ARE TRANSFERRED TO A SKILLED REHAB FACILITY If the patient is transferred to a skilled rehab facility following release from the hospital, a list of the current medications will be sent to the facility for the patient to continue.  When discharged from the skilled rehab facility, please have the facility set up the patient's Home Health Physical Therapy prior to being released. Also, the skilled facility will be responsible for providing the patient with their medications at time of release from the facility to include their pain medication, the muscle relaxants, and their blood thinner medication. If the patient is still at the rehab facility at time of the two week follow up appointment, the skilled rehab facility will also need to assist the patient in arranging follow up appointment in our office and any transportation needs.  MAKE SURE YOU:  Understand these instructions.  Get help right away if you are not doing well or get worse.   Pick up stool softner and laxative for home use following surgery while on pain medications. Do not submerge incision under water. Please use good hand washing techniques while changing dressing each day. May shower starting three days after surgery. Please use a  clean towel to pat the incision dry following showers. Continue to use ice for pain and swelling after surgery. Do not use any lotions or creams on the incision until instructed by your surgeon. 

## 2022-06-08 ENCOUNTER — Encounter (HOSPITAL_COMMUNITY): Payer: Self-pay | Admitting: Orthopedic Surgery

## 2022-06-08 LAB — CBC
HCT: 39.5 % (ref 36.0–46.0)
Hemoglobin: 12.8 g/dL (ref 12.0–15.0)
MCH: 28.3 pg (ref 26.0–34.0)
MCHC: 32.4 g/dL (ref 30.0–36.0)
MCV: 87.4 fL (ref 80.0–100.0)
Platelets: 212 10*3/uL (ref 150–400)
RBC: 4.52 MIL/uL (ref 3.87–5.11)
RDW: 13.3 % (ref 11.5–15.5)
WBC: 10.3 10*3/uL (ref 4.0–10.5)
nRBC: 0 % (ref 0.0–0.2)

## 2022-06-08 LAB — BASIC METABOLIC PANEL
Anion gap: 8 (ref 5–15)
BUN: 11 mg/dL (ref 8–23)
CO2: 27 mmol/L (ref 22–32)
Calcium: 8.9 mg/dL (ref 8.9–10.3)
Chloride: 105 mmol/L (ref 98–111)
Creatinine, Ser: 0.66 mg/dL (ref 0.44–1.00)
GFR, Estimated: >60 mL/min (ref >60)
Glucose, Bld: 141 mg/dL — ABNORMAL HIGH (ref 70–99)
Potassium: 4.4 mmol/L (ref 3.5–5.1)
Sodium: 140 mmol/L (ref 135–145)

## 2022-06-08 MED ORDER — HYDROCODONE-ACETAMINOPHEN 5-325 MG PO TABS: 1.0000 | ORAL_TABLET | Freq: Four times a day (QID) | ORAL | 0 refills | Status: DC | PRN

## 2022-06-08 MED ORDER — ASPIRIN 325 MG PO TBEC: 325.0000 mg | DELAYED_RELEASE_TABLET | Freq: Two times a day (BID) | ORAL | 0 refills | Status: AC

## 2022-06-08 MED ORDER — METHOCARBAMOL 500 MG PO TABS: 500.0000 mg | ORAL_TABLET | Freq: Four times a day (QID) | ORAL | 0 refills | Status: DC | PRN

## 2022-06-08 MED ORDER — TRAMADOL HCL 50 MG PO TABS: 50.0000 mg | ORAL_TABLET | Freq: Four times a day (QID) | ORAL | 0 refills | Status: DC | PRN

## 2022-06-08 NOTE — Progress Notes (Signed)
Pt has passed PT and is ready for discharge.  Remains alert and oriented.  AVS given.  Pt able to verbalize understanding of all discharge instructions, including wound care.  Pt does understand hip precautions as well.  All questions answered.  Pt will leave unit once husband has arrived to take her home.

## 2022-06-08 NOTE — Progress Notes (Signed)
   Subjective: 1 Day Post-Op Procedure(s) (LRB): Right hip constrained liner versus dual mobility cup (Right) Patient seen in rounds with Dr. Wynelle Link. Patient is well, and has had no acute complaints or problems. Denies SOB or chest pain. Denies calf pain. Foley cath to be removed this AM. Patient reports pain as mild.  We will start physical therapy today.   Objective: Vital signs in last 24 hours: Temp:  [97.7 F (36.5 C)-98.3 F (36.8 C)] 98.1 F (36.7 C) (10/05 0533) Pulse Rate:  [50-62] 53 (10/05 0533) Resp:  [12-18] 16 (10/05 0533) BP: (95-143)/(60-77) 125/72 (10/05 0533) SpO2:  [90 %-100 %] 99 % (10/05 0533) Weight:  [82.1 kg] 82.1 kg (10/04 1143)  Intake/Output from previous day:  Intake/Output Summary (Last 24 hours) at 06/08/2022 0744 Last data filed at 06/08/2022 0544 Gross per 24 hour  Intake 3041.25 ml  Output 4100 ml  Net -1058.75 ml     Intake/Output this shift: No intake/output data recorded.  Labs: Recent Labs    06/08/22 0328  HGB 12.8   Recent Labs    06/08/22 0328  WBC 10.3  RBC 4.52  HCT 39.5  PLT 212   Recent Labs    06/08/22 0328  NA 140  K 4.4  CL 105  CO2 27  BUN 11  CREATININE 0.66  GLUCOSE 141*  CALCIUM 8.9   No results for input(s): "LABPT", "INR" in the last 72 hours.  Exam: General - Patient is Alert and Oriented Extremity - Neurologically intact Neurovascular intact Sensation intact distally Dorsiflexion/Plantar flexion intact Dressing - dressing C/D/I Motor Function - intact, moving foot and toes well on exam.  Past Medical History:  Diagnosis Date   Acid reflux    Anemia    "during the time I was taking meloxicam"   Arthritis    Asthma    LOV  6/13  Dr Annamaria Boots EPIC/ clearance on chart  from 10/12   Depression    Dysrhythmia    PVC's with caffeine and anxiety   Heart murmur    since childhood   Hiatal hernia    High cholesterol    Hypertension    OV with clearance and note Dr Addison Lank 6/13 chart    Osteoporosis    Sleep apnea    no CPAP/ last study 9 yrs ago- "mild per patient"   STD (sexually transmitted disease)    HSV2   Stye 06/2009   eye    Assessment/Plan: 1 Day Post-Op Procedure(s) (LRB): Right hip constrained liner versus dual mobility cup (Right) Principal Problem:   Failed total hip arthroplasty with dislocation (HCC) Active Problems:   Recurrent dislocation of right hip  Estimated body mass index is 31.07 kg/m as calculated from the following:   Height as of this encounter: '5\' 4"'$  (1.626 m).   Weight as of this encounter: 82.1 kg. Advance diet Up with therapy D/C IV fluids  DVT Prophylaxis - Aspirin Weight bearing as tolerated D/C knee immobilizer Begin physical therapy Posterior hip precautions discussed with patient  Plan is to go Home after hospital stay. Expected discharge today pending progress with physical therapy. Will do HEP once discharged. Follow-up in clinic in 2 weeks.  The PDMP database was reviewed today prior to any opioid medications being prescribed to this patient.  R. Jaynie Bream, PA-C Orthopedic Surgery 06/08/2022, 7:44 AM

## 2022-06-08 NOTE — TOC Transition Note (Signed)
Transition of Care [facility]) - CM/SW Discharge Note   Patient Details  Name: [name] MRN: [id number] Date of Birth: [birth year]  Transition of Care [facility] CM/SW Contact:  Lennart Pall, LCSW Phone Number: 06/08/2022, 11:05 AM   Clinical Narrative:     Met with pt and confirming she has DME at home.  Plan for HEP.  No TOC needs.  Final next level of care: Home/Self Care Barriers to Discharge: No Barriers Identified   Patient Goals and CMS Choice Patient states their goals for this hospitalization and ongoing recovery are:: return home      Discharge Placement                       Discharge Plan and Services                DME Arranged: N/A DME Agency: NA                  Social Determinants of Health (SDOH) Interventions     Readmission Risk Interventions    06/08/2022   11:04 AM  Readmission Risk Prevention Plan  Post Dischage Appt Complete  Medication Screening Complete  Transportation Screening Complete

## 2022-06-08 NOTE — Evaluation (Signed)
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 Now s/P  revision with constrained liner on right on 06/07/22. PMH:Bil TKA.  Clinical Impression   The patient  reports no pain, just stiffness. Patient ambulated x 80' . Many questions answwered related to surgery and posterior hip precautions.  Patient should progress to Dc after next PT visit for step  and HEP review.       Recommendations for follow up therapy are one component of a multi-disciplinary discharge planning process, led by the attending physician.  Recommendations may be updated based on patient status, additional functional criteria and insurance authorization.  Follow Up Recommendations Follow physician's recommendations for discharge plan and follow up therapies      Assistance Recommended at Discharge PRN  Patient can return home with the following  Assistance with cooking/housework;Assist for transportation;Help with stairs or ramp for entrance;A little help with bathing/dressing/bathroom    Equipment Recommendations None recommended by PT  Recommendations for Other Services       Functional Status Assessment Patient has had a recent decline in their functional status and demonstrates the ability to make significant improvements in function in a reasonable and predictable amount of time.     Precautions / Restrictions Precautions Precautions: Posterior Hip Precaution Booklet Issued: Yes (comment) Restrictions Weight Bearing Restrictions: No      Mobility  Bed Mobility Overal bed mobility: Needs Assistance Bed Mobility: Supine to Sit     Supine to sit: Min guard     General bed mobility comments: cues for safety and  posterior  precautions    Transfers   Equipment used: Rolling walker (2 wheels)               General  transfer comment: cues for sequence    Ambulation/Gait Ambulation/Gait assistance: Min guard Gait Distance (Feet): 80 Feet Assistive device: Rolling walker (2 wheels) Gait Pattern/deviations: Step-through pattern       General Gait Details: cues for sequence  Stairs            Wheelchair Mobility    Modified Rankin (Stroke Patients Only)       Balance Overall balance assessment: Needs assistance Sitting-balance support: Feet supported, No upper extremity supported Sitting balance-Leahy Scale: Good     Standing balance support: During functional activity, Bilateral upper extremity supported Standing balance-Leahy Scale: Good                               Pertinent Vitals/Pain Pain Assessment Pain Assessment: 0-10 Pain Score: 2  Pain Descriptors / Indicators: Sore Pain Intervention(s): Monitored during session, Premedicated before session    Home Living Family/patient expects to be discharged to:: Private residence Living Arrangements: Spouse/significant other;Children Available Help at Discharge: Available 24 hours/day Type of Home: House Home Access: Stairs to enter Entrance Stairs-Rails: Right Entrance Stairs-Number of Steps: 2   Home Layout: One level Home Equipment: Grab bars - tub/shower;Rolling Walker (2 wheels);Cane - single point      Prior Function Prior Level of Function : Independent/Modified Independent             Mobility Comments: has been using RW       Hand Dominance   Dominant Hand: Right    Extremity/Trunk Assessment   Upper Extremity Assessment Upper Extremity Assessment: Overall  WFL for tasks assessed    Lower Extremity Assessment Lower Extremity Assessment: RLE deficits/detail RLE Deficits / Details: WFL for mobility    Cervical / Trunk Assessment Cervical / Trunk Assessment: Normal  Communication   Communication: No difficulties  Cognition Arousal/Alertness: Awake/alert Behavior During Therapy:  WFL for tasks assessed/performed Overall Cognitive Status: Within Functional Limits for tasks assessed                                          General Comments      Exercises     Assessment/Plan    PT Assessment Patient needs continued PT services  PT Problem List Decreased strength;Decreased mobility;Decreased safety awareness;Decreased range of motion;Decreased activity tolerance;Decreased knowledge of use of DME       PT Treatment Interventions DME instruction;Therapeutic activities;Gait training;Therapeutic exercise;Patient/family education;Stair training;Functional mobility training    PT Goals (Current goals can be found in the Care Plan section)  Acute Rehab PT Goals Patient Stated Goal: go home today PT Goal Formulation: With patient Time For Goal Achievement: 06/15/22 Potential to Achieve Goals: Good    Frequency 7X/week     Co-evaluation               AM-PAC PT "6 Clicks" Mobility  Outcome Measure Help needed turning from your back to your side while in a flat bed without using bedrails?: A Little Help needed moving from lying on your back to sitting on the side of a flat bed without using bedrails?: A Little Help needed moving to and from a bed to a chair (including a wheelchair)?: A Little Help needed standing up from a chair using your arms (e.g., wheelchair or bedside chair)?: A Little Help needed to walk in hospital room?: A Little Help needed climbing 3-5 steps with a railing? : A Little 6 Click Score: 18    End of Session Equipment Utilized During Treatment: Gait belt Activity Tolerance: Patient tolerated treatment well Patient left: in chair;with call bell/phone within reach;with chair alarm set Nurse Communication: Mobility status PT Visit Diagnosis: Unsteadiness on feet (R26.81);Difficulty in walking, not elsewhere classified (R26.2)    Time: 5465-0354 PT Time Calculation (min) (ACUTE ONLY): 33 min   Charges:   PT  Evaluation $PT Eval Low Complexity: 1 Low PT Treatments $Gait Training: 8-22 mins        [facility] [phone] Weekend [phone]   Claretha Cooper 06/08/2022, 1:16 PM

## 2022-06-08 NOTE — Plan of Care (Signed)
  Problem: Education: Goal: Knowledge of the prescribed therapeutic regimen will improve Outcome: Progressing Goal: Understanding of discharge needs will improve Outcome: Progressing Goal: Individualized Educational Video(s) Outcome: Progressing   Problem: Activity: Goal: Ability to avoid complications of mobility impairment will improve Outcome: Progressing Goal: Ability to tolerate increased activity will improve Outcome: Progressing   Problem: Clinical Measurements: Goal: Postoperative complications will be avoided or minimized Outcome: Progressing   Problem: Pain Management: Goal: Pain level will decrease with appropriate interventions Outcome: Progressing   Problem: Skin Integrity: Goal: Will show signs of wound healing Outcome: Progressing   Problem: Education: Goal: Knowledge of General Education information will improve Description: Including pain rating scale, medication(s)/side effects and non-pharmacologic comfort measures Outcome: Progressing   Problem: Health Behavior/Discharge Planning: Goal: Ability to manage health-related needs will improve Outcome: Progressing   Problem: Clinical Measurements: Goal: Ability to maintain clinical measurements within normal limits will improve Outcome: Progressing Goal: Will remain free from infection Outcome: Progressing Goal: Diagnostic test results will improve Outcome: Progressing Goal: Respiratory complications will improve Outcome: Progressing Goal: Cardiovascular complication will be avoided Outcome: Progressing   Problem: Activity: Goal: Risk for activity intolerance will decrease Outcome: Progressing   Problem: Nutrition: Goal: Adequate nutrition will be maintained Outcome: Progressing   Problem: Coping: Goal: Level of anxiety will decrease Outcome: Progressing   Problem: Elimination: Goal: Will not experience complications related to bowel motility Outcome: Progressing Goal: Will not experience  complications related to urinary retention Outcome: Progressing

## 2022-06-08 NOTE — Progress Notes (Signed)
Patient is requesting to keep her foley catheter until after the first session of PT. Patient dangled last night.

## 2022-06-08 NOTE — Progress Notes (Signed)
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 Now s/P  revision with constrained liner on right on 06/07/22. PMH:Bil TKA.    PT Comments    Patient progressing well, patient has met PT goals for Dc today.   Recommendations for follow up therapy are one component of a multi-disciplinary discharge planning process, led by the attending physician.  Recommendations may be updated based on patient status, additional functional criteria and insurance authorization.  Follow Up Recommendations  Follow physician's recommendations for discharge plan and follow up therapies     Assistance Recommended at Discharge PRN  Patient can return home with the following Assistance with cooking/housework;Assist for transportation;Help with stairs or ramp for entrance;A little help with bathing/dressing/bathroom   Equipment Recommendations  None recommended by PT    Recommendations for Other Services       Precautions / Restrictions Precautions Precautions: Posterior Hip Precaution Booklet Issued: Yes (comment) Restrictions Weight Bearing Restrictions: No     Mobility  Bed Mobility Overal bed mobility: Needs Assistance Bed Mobility: Supine to Sit, Sit to Supine     Supine to sit: Min guard Sit to supine: Min guard   General bed mobility comments: cues for safety and  posterior  precautions    Transfers   Equipment used: Rolling walker (2 wheels)               General transfer comment: cues for sequence    Ambulation/Gait Ambulation/Gait assistance: Supervision Gait Distance (Feet): 150 Feet Assistive device: Rolling walker (2 wheels) Gait Pattern/deviations: Step-through pattern       General Gait Details: cues for sequence   Stairs Stairs: Yes Stairs assistance: Min guard Stair Management: One  rail Left, Step to pattern   General stair comments: practiced 2 and 3 x 2 practices   Wheelchair Mobility    Modified Rankin (Stroke Patients Only)       Balance Overall balance assessment: No apparent balance deficits (not formally assessed) Sitting-balance support: Feet supported, No upper extremity supported Sitting balance-Leahy Scale: Good     Standing balance support: During functional activity, Bilateral upper extremity supported Standing balance-Leahy Scale: Good                              Cognition Arousal/Alertness: Awake/alert Behavior During Therapy: WFL for tasks assessed/performed Overall Cognitive Status: Within Functional Limits for tasks assessed                                          Exercises Total Joint Exercises Ankle Circles/Pumps: AROM, Both, 10 reps Quad Sets: AROM, Both, 10 reps Short Arc Quad: AROM, Right, 10 reps Heel Slides: AAROM, Right, 10 reps Hip ABduction/ADduction: AAROM, Right, 10 reps    General Comments        Pertinent Vitals/Pain Pain Assessment Pain Assessment: 0-10 Pain Score: 2  Pain Descriptors / Indicators: Sore, Tightness Pain Intervention(s): Patient requesting pain meds-RN notified, Monitored during session    Home Living Family/patient expects to be discharged to:: Private residence Living Arrangements: Spouse/significant other;Children Available Help at Discharge: Available 24 hours/day Type of Home: House Home Access: Stairs to enter Entrance Stairs-Rails: Right Entrance Stairs-Number of Steps: 2   Home Layout:  One level Home Equipment: Grab bars - tub/shower;Rolling Walker (2 wheels);Cane - single point      Prior Function            PT Goals (current goals can now be found in the care plan section) Acute Rehab PT Goals Patient Stated Goal: go home today PT Goal Formulation: With patient Time For Goal Achievement: 06/15/22 Potential to Achieve Goals:  Good Progress towards PT goals: Progressing toward goals    Frequency    7X/week      PT Plan Current plan remains appropriate    Co-evaluation              AM-PAC PT "6 Clicks" Mobility   Outcome Measure  Help needed turning from your back to your side while in a flat bed without using bedrails?: A Little Help needed moving from lying on your back to sitting on the side of a flat bed without using bedrails?: A Little Help needed moving to and from a bed to a chair (including a wheelchair)?: A Little Help needed standing up from a chair using your arms (e.g., wheelchair or bedside chair)?: A Little Help needed to walk in hospital room?: A Little Help needed climbing 3-5 steps with a railing? : A Little 6 Click Score: 18    End of Session Equipment Utilized During Treatment: Gait belt Activity Tolerance: Patient tolerated treatment well Patient left: in chair;with call bell/phone within reach;with chair alarm set Nurse Communication: Mobility status PT Visit Diagnosis: Unsteadiness on feet (R26.81);Difficulty in walking, not elsewhere classified (R26.2)     Time: 0484-9865 PT Time Calculation (min) (ACUTE ONLY): 21 min  Charges:  $Gait Training: 8-22 mins                     [facility] [phone] Weekend [phone]     Claretha Cooper 06/08/2022, 1:23 PM

## 2022-06-08 NOTE — Plan of Care (Signed)
Problem: Education: Goal: Knowledge of the prescribed therapeutic regimen will improve 06/08/2022 1104 by Ahmoni Edge L, RN Outcome: Adequate for Discharge 06/08/2022 0855 by Everett Ehrler L, RN Outcome: Progressing Goal: Understanding of discharge needs will improve 06/08/2022 1104 by Nigel Ericsson L, RN Outcome: Adequate for Discharge 06/08/2022 0855 by Marites Nath L, RN Outcome: Progressing Goal: Individualized Educational Video(s) 06/08/2022 1104 by Sawsan Riggio L, RN Outcome: Adequate for Discharge 06/08/2022 0855 by Tri Chittick L, RN Outcome: Progressing   Problem: Activity: Goal: Ability to avoid complications of mobility impairment will improve 06/08/2022 1104 by Larayne Baxley L, RN Outcome: Adequate for Discharge 06/08/2022 0855 by Arlynn Mcdermid L, RN Outcome: Progressing Goal: Ability to tolerate increased activity will improve 06/08/2022 1104 by Marinna Blane L, RN Outcome: Adequate for Discharge 06/08/2022 0855 by Damone Fancher L, RN Outcome: Progressing   Problem: Clinical Measurements: Goal: Postoperative complications will be avoided or minimized 06/08/2022 1104 by Kielyn Kardell L, RN Outcome: Adequate for Discharge 06/08/2022 0855 by Arisbeth Purrington L, RN Outcome: Progressing   Problem: Pain Management: Goal: Pain level will decrease with appropriate interventions 06/08/2022 1104 by Lake Cinquemani L, RN Outcome: Adequate for Discharge 06/08/2022 0855 by Tanzie Rothschild L, RN Outcome: Progressing   Problem: Skin Integrity: Goal: Will show signs of wound healing 06/08/2022 1104 by Dwight Adamczak L, RN Outcome: Adequate for Discharge 06/08/2022 0855 by Dirk Vanaman L, RN Outcome: Progressing   Problem: Education: Goal: Knowledge of General Education information will improve Description: Including pain rating scale, medication(s)/side effects and non-pharmacologic comfort measures 06/08/2022 1104 by Eugene Zeiders L, RN Outcome: Adequate for Discharge 06/08/2022  0855 by Ellyanna Holton L, RN Outcome: Progressing   Problem: Health Behavior/Discharge Planning: Goal: Ability to manage health-related needs will improve 06/08/2022 1104 by Loriel Diehl L, RN Outcome: Adequate for Discharge 06/08/2022 0855 by Myrtle Haller L, RN Outcome: Progressing   Problem: Clinical Measurements: Goal: Ability to maintain clinical measurements within normal limits will improve 06/08/2022 1104 by Mari Battaglia L, RN Outcome: Adequate for Discharge 06/08/2022 0855 by Izaha Shughart L, RN Outcome: Progressing Goal: Will remain free from infection 06/08/2022 1104 by Tawnee Clegg L, RN Outcome: Adequate for Discharge 06/08/2022 0855 by Khoi Hamberger L, RN Outcome: Progressing Goal: Diagnostic test results will improve 06/08/2022 1104 by Stellah Donovan L, RN Outcome: Adequate for Discharge 06/08/2022 0855 by Casimira Sutphin L, RN Outcome: Progressing Goal: Respiratory complications will improve 06/08/2022 1104 by Adalynd Donahoe L, RN Outcome: Adequate for Discharge 06/08/2022 0855 by Loyd Marhefka L, RN Outcome: Progressing Goal: Cardiovascular complication will be avoided 06/08/2022 1104 by Arayla Kruschke L, RN Outcome: Adequate for Discharge 06/08/2022 0855 by Shonica Weier L, RN Outcome: Progressing   Problem: Activity: Goal: Risk for activity intolerance will decrease 06/08/2022 1104 by Sheldon Sem L, RN Outcome: Adequate for Discharge 06/08/2022 0855 by Nasirah Sachs L, RN Outcome: Progressing   Problem: Nutrition: Goal: Adequate nutrition will be maintained 06/08/2022 1104 by Christabella Alvira L, RN Outcome: Adequate for Discharge 06/08/2022 0855 by Kamisha Ell L, RN Outcome: Progressing   Problem: Coping: Goal: Level of anxiety will decrease 06/08/2022 1104 by Meital Riehl L, RN Outcome: Adequate for Discharge 06/08/2022 0855 by Necie Wilcoxson L, RN Outcome: Progressing   Problem: Elimination: Goal: Will not experience complications related to bowel  motility 06/08/2022 1104 by Kynan Peasley L, RN Outcome: Adequate for Discharge 06/08/2022 0855 by Sagar Tengan L, RN Outcome: Progressing Goal: Will not experience complications related to urinary retention 06/08/2022 1104 by Dennis Hegeman L, RN  Outcome: Adequate for Discharge 06/08/2022 0855 by Christina Waldrop L, RN Outcome: Progressing   Problem: Pain Managment: Goal: General experience of comfort will improve 06/08/2022 1104 by Zeda Gangwer L, RN Outcome: Adequate for Discharge 06/08/2022 0855 by Terica Yogi L, RN Outcome: Progressing   Problem: Safety: Goal: Ability to remain free from injury will improve 06/08/2022 1104 by Raji Glinski L, RN Outcome: Adequate for Discharge 06/08/2022 0855 by Yazmina Pareja L, RN Outcome: Progressing   Problem: Skin Integrity: Goal: Risk for impaired skin integrity will decrease 06/08/2022 1104 by Mead Slane L, RN Outcome: Adequate for Discharge 06/08/2022 0855 by Adriell Polansky L, RN Outcome: Progressing

## 2022-06-09 NOTE — Discharge Summary (Signed)
Physician Discharge Summary   Patient ID: [name] MRN: [id number] DOB/AGE: September 14, 1950 71 y.o.  Admit date: 06/07/2022 Discharge date: 06/08/2022  Primary Diagnosis: Recurrent dislocations, right hip.   Admission Diagnoses:  Past Medical History:  Diagnosis Date   Acid reflux    Anemia    "during the time I was taking meloxicam"   Arthritis    Asthma    LOV  6/13  Dr Annamaria Boots EPIC/ clearance on chart  from 10/12   Depression    Dysrhythmia    PVC's with caffeine and anxiety   Heart murmur    since childhood   Hiatal hernia    High cholesterol    Hypertension    OV with clearance and note Dr Addison Lank 6/13 chart   Osteoporosis    Sleep apnea    no CPAP/ last study 9 yrs ago- "mild per patient"   STD (sexually transmitted disease)    HSV2   Stye 06/2009   eye   Discharge Diagnoses:   Principal Problem:   Failed total hip arthroplasty with dislocation ([place]) Active Problems:   Recurrent dislocation of right hip  Estimated body mass index is 31.07 kg/m as calculated from the following:   Height as of this encounter: '5\' 4"'$  (1.626 m).   Weight as of this encounter: 82.1 kg.  Procedure:  Procedure(s) (LRB): Right hip constrained liner versus dual mobility cup (Right)   Consults: None  HPI: The patient is a 71 year old female who had a right total hip arthroplasty done several years ago.  She did fine until recently where she has had a recurrent dislocation episodes.  She presents now for acetabular liner versus total hip arthroplasty revision.  Laboratory Data: Admission on 06/07/2022, Discharged on 06/08/2022  Component Date Value Ref Range Status   WBC 06/08/2022 10.3  4.0 - 10.5 K/uL Final   RBC 06/08/2022 4.52  3.87 - 5.11 MIL/uL Final   Hemoglobin 06/08/2022 12.8  12.0 - 15.0 g/dL Final   HCT 06/08/2022 39.5  36.0 - 46.0 % Final   MCV 06/08/2022 87.4  80.0 - 100.0 fL Final   MCH 06/08/2022 28.3  26.0 - 34.0 pg Final   MCHC 06/08/2022 32.4  30.0 - 36.0  g/dL Final   RDW 06/08/2022 13.3  11.5 - 15.5 % Final   Platelets 06/08/2022 212  150 - 400 K/uL Final   nRBC 06/08/2022 0.0  0.0 - 0.2 % Final   Performed at [facility], [place] [address] [place], [place] [address]   Sodium 06/08/2022 140  135 - 145 mmol/L Final   Potassium 06/08/2022 4.4  3.5 - 5.1 mmol/L Final   Chloride 06/08/2022 105  98 - 111 mmol/L Final   CO2 06/08/2022 27  22 - 32 mmol/L Final   Glucose, Bld 06/08/2022 141 (H)  70 - 99 mg/dL Final   Glucose reference range applies only to samples taken after fasting for at least 8 hours.   BUN 06/08/2022 11  8 - 23 mg/dL Final   Creatinine, Ser 06/08/2022 0.66  0.44 - 1.00 mg/dL Final   Calcium 06/08/2022 8.9  8.9 - 10.3 mg/dL Final   GFR, Estimated 06/08/2022 >60  >60 mL/min Final   Comment: (NOTE) Calculated using the CKD-EPI Creatinine Equation (2021)    Anion gap 06/08/2022 8  5 - 15 Final   Performed at [facility], [place] [address], [place], [place] [address]  Hospital Outpatient Visit on 05/25/2022  Component Date Value Ref  Range Status   MRSA, PCR 05/25/2022 NEGATIVE  NEGATIVE Final   Staphylococcus aureus 05/25/2022 NEGATIVE  NEGATIVE Final   Comment: (NOTE) The Xpert SA Assay (FDA approved for NASAL specimens in patients 64 years of age and older), is one component of a comprehensive surveillance program. It is not intended to diagnose infection nor to guide or monitor treatment. Performed at [facility], [place] [address] [place], [place] [address]    Sodium 05/25/2022 136  135 - 145 mmol/L Final   Potassium 05/25/2022 4.2  3.5 - 5.1 mmol/L Final   Chloride 05/25/2022 103  98 - 111 mmol/L Final   CO2 05/25/2022 26  22 - 32 mmol/L Final   Glucose, Bld 05/25/2022 119 (H)  70 - 99 mg/dL Final   Glucose reference range applies only to samples taken after fasting for at least 8 hours.   BUN 05/25/2022 20  8 - 23 mg/dL Final   Creatinine, Ser 05/25/2022 0.80   0.44 - 1.00 mg/dL Final   Calcium 05/25/2022 9.3  8.9 - 10.3 mg/dL Final   GFR, Estimated 05/25/2022 >60  >60 mL/min Final   Comment: (NOTE) Calculated using the CKD-EPI Creatinine Equation (2021)    Anion gap 05/25/2022 7  5 - 15 Final   Performed at [facility], [place] [address], [place], [place] [address]   WBC 05/25/2022 7.4  4.0 - 10.5 K/uL Final   RBC 05/25/2022 4.71  3.87 - 5.11 MIL/uL Final   Hemoglobin 05/25/2022 13.2  12.0 - 15.0 g/dL Final   HCT 05/25/2022 41.7  36.0 - 46.0 % Final   MCV 05/25/2022 88.5  80.0 - 100.0 fL Final   MCH 05/25/2022 28.0  26.0 - 34.0 pg Final   MCHC 05/25/2022 31.7  30.0 - 36.0 g/dL Final   RDW 05/25/2022 13.2  11.5 - 15.5 % Final   Platelets 05/25/2022 209  150 - 400 K/uL Final   nRBC 05/25/2022 0.0  0.0 - 0.2 % Final   Performed at [facility], [place] [address], [place], [place] [address]   ABO/RH(D) 05/25/2022 O POS   Final   Antibody Screen 05/25/2022 NEG   Final   Sample Expiration 05/25/2022 06/08/2022,2359   Final   Extend sample reason 05/25/2022    Final                   Value:NO TRANSFUSIONS OR PREGNANCY IN THE PAST 3 MONTHS Performed at [place] [address] [place], [place] [address]      X-Rays:DG Pelvis Portable  Result Date: 06/07/2022 CLINICAL DATA:  Hip pain. EXAM: PORTABLE PELVIS 1-2 VIEWS COMPARISON:  04/24/2010 FINDINGS: Both hips are normally located. The marker for the liner of the right hip prosthesis appears to have slipped inferiorly suggesting that it is loose. The visualized bony pelvis is intact. IMPRESSION: 1. Suspect loose liner of the right hip prosthesis. Recommend clinical correlation 2. No acute bony findings. Electronically Signed   By: Marijo Sanes M.D.   On: 06/07/2022 15:06    EKG: Orders placed or performed during the hospital encounter of 05/25/22   EKG 12 lead per protocol   EKG 12 lead per protocol     Hospital Course: [name]  is a 71 y.o. who was admitted to [facility]. They were brought to the operating room on 06/07/2022 and underwent Procedure(s): Right hip constrained liner versus dual mobility cup.  Patient tolerated the procedure well and  was later transferred to the recovery room and then to the orthopaedic floor for postoperative care. They were given PO and IV analgesics for pain control following their surgery. They were given 24 hours of postoperative antibiotics of  Anti-infectives (From admission, onward)    Start     Dose/Rate Route Frequency Ordered Stop   06/07/22 2000  ceFAZolin (ANCEF) IVPB 2g/100 mL premix        2 g 200 mL/hr over 30 Minutes Intravenous Every 6 hours 06/07/22 1734 06/08/22 0315   06/07/22 1130  ceFAZolin (ANCEF) IVPB 2g/100 mL premix        2 g 200 mL/hr over 30 Minutes Intravenous On call to O.R. 06/07/22 1115 06/07/22 1253      and started on DVT prophylaxis in the form of Aspirin.   PT and OT were ordered for total joint protocol. Discharge planning consulted to help with postop disposition and equipment needs.  Patient had a good night on the evening of surgery. Pt was seen during rounds the following morning and was ready to go home pending progress with therapy. She worked with therapy on POD #1 and was meeting her goals. Pt was discharged to home later that day in stable condition.  Diet: Regular diet Activity: WBAT Follow-up: in 2 weeks Disposition: Home Discharged Condition: stable   Discharge Instructions     Call MD / Call 911   Complete by: As directed    If you experience chest pain or shortness of breath, CALL 911 and be transported to the hospital emergency room.  If you develope a fever above 101 F, pus (white drainage) or increased drainage or redness at the wound, or calf pain, call your surgeon's office.   Change dressing   Complete by: As directed    You have an adhesive waterproof bandage over the incision. Leave this in place until your first  follow-up appointment. Once you remove this you will not need to place another bandage.   Constipation Prevention   Complete by: As directed    Drink plenty of fluids.  Prune juice may be helpful.  You may use a stool softener, such as Colace (over the counter) 100 mg twice a day.  Use MiraLax (over the counter) for constipation as needed.   Diet - low sodium heart healthy   Complete by: As directed    Do not sit on low chairs, stoools or toilet seats, as it may be difficult to get up from low surfaces   Complete by: As directed    Driving restrictions   Complete by: As directed    No driving for two weeks   Follow the hip precautions as taught in Physical Therapy   Complete by: As directed    Post-operative opioid taper instructions:   Complete by: As directed    POST-OPERATIVE OPIOID TAPER INSTRUCTIONS: It is important to wean off of your opioid medication as soon as possible. If you do not need pain medication after your surgery it is ok to stop day one. Opioids include: Codeine, Hydrocodone(Norco, Vicodin), Oxycodone(Percocet, oxycontin) and hydromorphone amongst others.  Long term and even short term use of opiods can cause: Increased pain response Dependence Constipation Depression Respiratory depression And more.  Withdrawal symptoms can include Flu like symptoms Nausea, vomiting And more Techniques to manage these symptoms Hydrate well Eat regular healthy meals Stay active Use relaxation techniques(deep breathing, meditating, yoga) Do Not substitute Alcohol to help with tapering If you have been on opioids for  less than two weeks and do not have pain than it is ok to stop all together.  Plan to wean off of opioids This plan should start within one week post op of your joint replacement. Maintain the same interval or time between taking each dose and first decrease the dose.  Cut the total daily intake of opioids by one tablet each day Next start to increase the time  between doses. The last dose that should be eliminated is the evening dose.      TED hose   Complete by: As directed    Use stockings (TED hose) for three weeks on both leg(s).  You may remove them at night for sleeping.   Weight bearing as tolerated   Complete by: As directed       Allergies as of 06/08/2022       Reactions   Corgard [nadolol] Other (See Comments)   Low blood pressure   Demerol [meperidine] Nausea And Vomiting   Meloxicam Other (See Comments)   Blood count went down        Medication List     STOP taking these medications    Advil Dual Action 125-250 MG Tabs Generic drug: Ibuprofen-Acetaminophen       TAKE these medications    albuterol 108 (90 Base) MCG/ACT inhaler Commonly known as: VENTOLIN HFA INHALE 2 PUFFS INTO THE LUNGS EVERY 4 HOURS AS NEEDED FOR WHEEZE OR FOR SHORTNESS OF BREATH   amLODipine 5 MG tablet Commonly known as: NORVASC Take 5 mg by mouth in the morning.   aspirin EC 325 MG tablet Take 1 tablet (325 mg total) by mouth 2 (two) times daily for 20 days. Then take one 81 mg aspirin once a day for three weeks. Then discontinue aspirin.   atorvastatin 40 MG tablet Commonly known as: LIPITOR Take 40 mg by mouth at bedtime.   azelastine 0.1 % nasal spray Commonly known as: ASTELIN Place 1 spray into both nostrils in the morning. Use in each nostril as directed   B-complex with vitamin C tablet Take 1 tablet by mouth once a week.   CALCIUM + VITAMIN D3 PO Take 1 tablet by mouth at bedtime.   cetirizine 10 MG tablet Commonly known as: ZYRTEC Take 10 mg by mouth daily as needed for allergies.   cloNIDine 0.1 MG tablet Commonly known as: CATAPRES Take 0.1 mg by mouth every evening.   docusate sodium 100 MG capsule Commonly known as: COLACE Take 100 mg by mouth at bedtime.   esomeprazole 40 MG capsule Commonly known as: NEXIUM Take 40 mg by mouth daily before breakfast.   Eszopiclone 3 MG Tabs Take 3 mg by mouth at  bedtime. Take immediately before bedtime   fluticasone 50 MCG/ACT nasal spray Commonly known as: FLONASE PLACE 2 SPRAYS INTO BOTH NOSTRILS AT BEDTIME. What changed: how much to take   HYDROcodone-acetaminophen 5-325 MG tablet Commonly known as: NORCO/VICODIN Take 1-2 tablets by mouth every 6 (six) hours as needed for severe pain.   Magnesium 500 MG Tabs Take 500 mg by mouth at bedtime.   melatonin 3 MG Tabs tablet Take 3 mg by mouth at bedtime as needed (sleep).   methocarbamol 500 MG tablet Commonly known as: ROBAXIN Take 1 tablet (500 mg total) by mouth every 6 (six) hours as needed for muscle spasms. What changed: when to take this   montelukast 10 MG tablet Commonly known as: SINGULAIR TAKE 1 TABLET BY MOUTH EVERY DAY What changed:  when to take this additional instructions   PRENATAL 1 + IRON PO Take 1 tablet by mouth in the morning.   PROBIOTIC PO Take 1 capsule by mouth every evening.   Prolia 60 MG/ML Sosy injection Generic drug: denosumab Inject 60 mg into the skin every 6 (six) months.   Systane Ultra PF 0.4-0.3 % Soln Generic drug: Polyethyl Glyc-Propyl Glyc PF Place 1 drop into both eyes in the morning and at bedtime.   traMADol 50 MG tablet Commonly known as: ULTRAM Take 1-2 tablets (50-100 mg total) by mouth every 6 (six) hours as needed for moderate pain.   Trelegy Ellipta 100-62.5-25 MCG/ACT Aepb Generic drug: Fluticasone-Umeclidin-Vilant TAKE 1 PUFF BY MOUTH EVERY DAY What changed: See the new instructions.   valACYclovir 500 MG tablet Commonly known as: VALTREX Take 1 tablet (500 mg total) by mouth daily. Increase to bid x 3 days with outbreaks. What changed:  when to take this additional instructions   VITAMIN D3 PO Take 1 capsule by mouth in the morning.               Discharge Care Instructions  (From admission, onward)           Start     Ordered   06/08/22 0000  Weight bearing as tolerated        06/08/22 0749    06/08/22 0000  Change dressing       Comments: You have an adhesive waterproof bandage over the incision. Leave this in place until your first follow-up appointment. Once you remove this you will not need to place another bandage.   06/08/22 0749            Follow-up Information     Gaynelle Arabian, MD. Schedule an appointment as soon as possible for a visit in 2 week(s).   Specialty: Orthopedic Surgery Contact information: [address] [place] [address] [phone]                 Signed: R. Jaynie Bream, PA-C Orthopedic Surgery 06/09/2022, 10:05 AM

## 2022-07-06 ENCOUNTER — Other Ambulatory Visit (HOSPITAL_BASED_OUTPATIENT_CLINIC_OR_DEPARTMENT_OTHER): Payer: Self-pay | Admitting: Obstetrics & Gynecology

## 2022-07-06 DIAGNOSIS — Z1389 Encounter for screening for other disorder: Secondary | ICD-10-CM | POA: Diagnosis not present

## 2022-07-06 DIAGNOSIS — Z0000 Encounter for general adult medical examination without abnormal findings: Secondary | ICD-10-CM | POA: Diagnosis not present

## 2022-07-06 DIAGNOSIS — Z6832 Body mass index (BMI) 32.0-32.9, adult: Secondary | ICD-10-CM | POA: Diagnosis not present

## 2022-07-06 DIAGNOSIS — Z0289 Encounter for other administrative examinations: Secondary | ICD-10-CM

## 2022-07-11 ENCOUNTER — Encounter: Payer: Self-pay | Admitting: Podiatry

## 2022-07-11 ENCOUNTER — Ambulatory Visit: Payer: Medicare PPO | Admitting: Podiatry

## 2022-07-11 DIAGNOSIS — B351 Tinea unguium: Secondary | ICD-10-CM | POA: Diagnosis not present

## 2022-07-11 DIAGNOSIS — M79676 Pain in unspecified toe(s): Secondary | ICD-10-CM | POA: Diagnosis not present

## 2022-07-13 ENCOUNTER — Ambulatory Visit (HOSPITAL_BASED_OUTPATIENT_CLINIC_OR_DEPARTMENT_OTHER)
Admission: RE | Admit: 2022-07-13 | Discharge: 2022-07-13 | Disposition: A | Discharge status: Home / Self Care | Payer: Medicare PPO | Source: Ambulatory Visit | Attending: Obstetrics & Gynecology | Admitting: Obstetrics & Gynecology

## 2022-07-13 ENCOUNTER — Other Ambulatory Visit (HOSPITAL_BASED_OUTPATIENT_CLINIC_OR_DEPARTMENT_OTHER): Payer: Self-pay | Admitting: Family Medicine

## 2022-07-13 DIAGNOSIS — Z0289 Encounter for other administrative examinations: Secondary | ICD-10-CM | POA: Insufficient documentation

## 2022-07-13 DIAGNOSIS — Z1231 Encounter for screening mammogram for malignant neoplasm of breast: Secondary | ICD-10-CM

## 2022-07-16 NOTE — Progress Notes (Signed)
  Subjective:  Patient ID: [name], female    DOB: 01/10/1951,  MRN: [id number]  [name] presents to clinic today for:  Chief Complaint  Patient presents with   Nail Problem    RFC Bilateral nail trim 1-5.   Marland Kitchen  PCP is Cari Caraway, MD.  Allergies  Allergen Reactions   Corgard [Nadolol] Other (See Comments)    Low blood pressure    Demerol [Meperidine] Nausea And Vomiting   Meloxicam Other (See Comments)    Blood count went down     Review of Systems: Negative except as noted in the HPI.  Objective: No changes noted in today's physical examination. [name] is a pleasant 71 y.o. female in NAD. AAO x 3.  Vascular Examination: Capillary refill time <3 seconds b/l LE. Palpable pedal pulses b/l LE. Digital hair present b/l. No pedal edema b/l. Skin temperature gradient WNL b/l. No varicosities b/l. No cyanosis or clubbing noted b/l LE.Marland Kitchen  Dermatological Examination: Pedal skin with normal turgor, texture and tone b/l. No open wounds. No interdigital macerations b/l. Toenails 1-5 b/l thickened, discolored, dystrophic with subungual debris. There is pain on palpation to dorsal aspect of nailplates. No hyperkeratotic nor porokeratotic lesions present on today's visit.Marland Kitchen  Neurological Examination: Protective sensation intact with 10 gram monofilament b/l LE. Vibratory sensation intact b/l LE.   Musculoskeletal Examination: Normal muscle strength 5/5 to all lower extremity muscle groups bilaterally. No pain, crepitus or joint limitation noted with ROM b/l LE. No gross bony pedal deformities b/l. Patient ambulates independently without assistive aids.  Assessment/Plan: 1. Pain due to onychomycosis of toenail     No orders of the defined types were placed in this encounter.   -Patient was evaluated and treated. All patient's and/or POA's questions/concerns answered on today's visit. -Patient to continue soft, supportive shoe gear daily. -Toenails 1-5 b/l were  debrided in length and girth with sterile nail nippers and dremel without iatrogenic bleeding.  -Patient/POA to call should there be question/concern in the interim.   Return in about 3 months (around 10/11/2022).  Marzetta Board, DPM

## 2022-07-18 DIAGNOSIS — Z96651 Presence of right artificial knee joint: Secondary | ICD-10-CM | POA: Diagnosis not present

## 2022-07-18 DIAGNOSIS — Z471 Aftercare following joint replacement surgery: Secondary | ICD-10-CM | POA: Diagnosis not present

## 2022-07-20 DIAGNOSIS — G4700 Insomnia, unspecified: Secondary | ICD-10-CM | POA: Diagnosis not present

## 2022-07-20 DIAGNOSIS — J45909 Unspecified asthma, uncomplicated: Secondary | ICD-10-CM | POA: Diagnosis not present

## 2022-07-20 DIAGNOSIS — Z1211 Encounter for screening for malignant neoplasm of colon: Secondary | ICD-10-CM | POA: Diagnosis not present

## 2022-07-20 DIAGNOSIS — E782 Mixed hyperlipidemia: Secondary | ICD-10-CM | POA: Diagnosis not present

## 2022-07-20 DIAGNOSIS — K5730 Diverticulosis of large intestine without perforation or abscess without bleeding: Secondary | ICD-10-CM | POA: Diagnosis not present

## 2022-07-20 DIAGNOSIS — Z86010 Personal history of colonic polyps: Secondary | ICD-10-CM | POA: Diagnosis not present

## 2022-07-20 DIAGNOSIS — I10 Essential (primary) hypertension: Secondary | ICD-10-CM | POA: Diagnosis not present

## 2022-07-20 DIAGNOSIS — K219 Gastro-esophageal reflux disease without esophagitis: Secondary | ICD-10-CM | POA: Diagnosis not present

## 2022-07-20 DIAGNOSIS — K5900 Constipation, unspecified: Secondary | ICD-10-CM | POA: Diagnosis not present

## 2022-07-25 DIAGNOSIS — Z6830 Body mass index (BMI) 30.0-30.9, adult: Secondary | ICD-10-CM | POA: Diagnosis not present

## 2022-07-25 DIAGNOSIS — E669 Obesity, unspecified: Secondary | ICD-10-CM | POA: Diagnosis not present

## 2022-07-25 DIAGNOSIS — R7303 Prediabetes: Secondary | ICD-10-CM | POA: Diagnosis not present

## 2022-07-25 DIAGNOSIS — I10 Essential (primary) hypertension: Secondary | ICD-10-CM | POA: Diagnosis not present

## 2022-07-27 ENCOUNTER — Other Ambulatory Visit (HOSPITAL_BASED_OUTPATIENT_CLINIC_OR_DEPARTMENT_OTHER): Payer: Self-pay | Admitting: Obstetrics & Gynecology

## 2022-07-31 NOTE — Telephone Encounter (Signed)
LMOVM for pt to call regarding refill request 

## 2022-08-22 DIAGNOSIS — M810 Age-related osteoporosis without current pathological fracture: Secondary | ICD-10-CM | POA: Diagnosis not present

## 2022-09-12 DIAGNOSIS — H25013 Cortical age-related cataract, bilateral: Secondary | ICD-10-CM | POA: Diagnosis not present

## 2022-09-12 DIAGNOSIS — H25043 Posterior subcapsular polar age-related cataract, bilateral: Secondary | ICD-10-CM | POA: Diagnosis not present

## 2022-09-12 DIAGNOSIS — H18413 Arcus senilis, bilateral: Secondary | ICD-10-CM | POA: Diagnosis not present

## 2022-09-12 DIAGNOSIS — H2512 Age-related nuclear cataract, left eye: Secondary | ICD-10-CM | POA: Diagnosis not present

## 2022-09-12 DIAGNOSIS — H2513 Age-related nuclear cataract, bilateral: Secondary | ICD-10-CM | POA: Diagnosis not present

## 2022-09-27 DIAGNOSIS — K635 Polyp of colon: Secondary | ICD-10-CM | POA: Diagnosis not present

## 2022-09-27 DIAGNOSIS — D122 Benign neoplasm of ascending colon: Secondary | ICD-10-CM | POA: Diagnosis not present

## 2022-09-27 DIAGNOSIS — Z1211 Encounter for screening for malignant neoplasm of colon: Secondary | ICD-10-CM | POA: Diagnosis not present

## 2022-09-27 DIAGNOSIS — D175 Benign lipomatous neoplasm of intra-abdominal organs: Secondary | ICD-10-CM | POA: Diagnosis not present

## 2022-09-27 DIAGNOSIS — Z86010 Personal history of colonic polyps: Secondary | ICD-10-CM | POA: Diagnosis not present

## 2022-09-27 DIAGNOSIS — K5730 Diverticulosis of large intestine without perforation or abscess without bleeding: Secondary | ICD-10-CM | POA: Diagnosis not present

## 2022-10-05 DIAGNOSIS — R7303 Prediabetes: Secondary | ICD-10-CM | POA: Diagnosis not present

## 2022-10-05 DIAGNOSIS — I10 Essential (primary) hypertension: Secondary | ICD-10-CM | POA: Diagnosis not present

## 2022-10-05 DIAGNOSIS — Z6830 Body mass index (BMI) 30.0-30.9, adult: Secondary | ICD-10-CM | POA: Diagnosis not present

## 2022-10-05 DIAGNOSIS — E669 Obesity, unspecified: Secondary | ICD-10-CM | POA: Diagnosis not present

## 2022-10-31 ENCOUNTER — Ambulatory Visit: Payer: Medicare PPO | Admitting: Podiatry

## 2022-10-31 VITALS — BP 150/78

## 2022-10-31 DIAGNOSIS — M79676 Pain in unspecified toe(s): Secondary | ICD-10-CM

## 2022-10-31 DIAGNOSIS — B351 Tinea unguium: Secondary | ICD-10-CM

## 2022-10-31 NOTE — Progress Notes (Unsigned)
  Subjective:  Patient ID: [name], female    DOB: [birth year],  MRN: [id number]  [name] presents to clinic today for {jgcomplaint:23593}  Chief Complaint  Patient presents with   Nail Problem    RFC PCP-McNeil, Abigail Butts PCP VST-2023   New problem(s): None. {jgcomplaint:23593}  PCP is Cari Caraway, MD.  Allergies  Allergen Reactions   Corgard [Nadolol] Other (See Comments)    Low blood pressure    Demerol [Meperidine] Nausea And Vomiting   Meloxicam Other (See Comments)    Blood count went down     Review of Systems: Negative except as noted in the HPI.  Objective: No changes noted in today's physical examination. Vitals:   10/31/22 1656  BP: (!) 150/78   [name] is a pleasant 72 y.o. female {jgbodyhabitus:24098} AAO x 3.  Vascular Examination: Capillary refill time <3 seconds b/l LE. Palpable pedal pulses b/l LE. Digital hair present b/l. No pedal edema b/l. Skin temperature gradient WNL b/l. No varicosities b/l. No cyanosis or clubbing noted b/l LE.Marland Kitchen  Dermatological Examination: Pedal skin with normal turgor, texture and tone b/l. No open wounds. No interdigital macerations b/l. Toenails 1-5 b/l thickened, discolored, dystrophic with subungual debris. There is pain on palpation to dorsal aspect of nailplates. No hyperkeratotic nor porokeratotic lesions present on today's visit.Marland Kitchen  Neurological Examination: Protective sensation intact with 10 gram monofilament b/l LE. Vibratory sensation intact b/l LE.   Musculoskeletal Examination: Normal muscle strength 5/5 to all lower extremity muscle groups bilaterally. No pain, crepitus or joint limitation noted with ROM b/l LE. No gross bony pedal deformities b/l. Patient ambulates independently without assistive aids.  Assessment/Plan: No diagnosis found.  No orders of the defined types were placed in this encounter.   None {Jgplan:23602::"-Patient/POA to call should there be question/concern in the  interim."}   Return in about 3 months (around 01/29/2023).  Marzetta Board, DPM

## 2022-11-02 ENCOUNTER — Encounter: Payer: Self-pay | Admitting: Podiatry

## 2022-11-06 DIAGNOSIS — H2512 Age-related nuclear cataract, left eye: Secondary | ICD-10-CM | POA: Diagnosis not present

## 2022-11-06 DIAGNOSIS — H52202 Unspecified astigmatism, left eye: Secondary | ICD-10-CM | POA: Diagnosis not present

## 2022-11-07 DIAGNOSIS — H2511 Age-related nuclear cataract, right eye: Secondary | ICD-10-CM | POA: Diagnosis not present

## 2022-11-16 DIAGNOSIS — R7303 Prediabetes: Secondary | ICD-10-CM | POA: Diagnosis not present

## 2022-11-16 DIAGNOSIS — E782 Mixed hyperlipidemia: Secondary | ICD-10-CM | POA: Diagnosis not present

## 2022-11-16 DIAGNOSIS — R7309 Other abnormal glucose: Secondary | ICD-10-CM | POA: Diagnosis not present

## 2022-11-16 DIAGNOSIS — E669 Obesity, unspecified: Secondary | ICD-10-CM | POA: Diagnosis not present

## 2022-11-16 DIAGNOSIS — I10 Essential (primary) hypertension: Secondary | ICD-10-CM | POA: Diagnosis not present

## 2022-11-16 DIAGNOSIS — K219 Gastro-esophageal reflux disease without esophagitis: Secondary | ICD-10-CM | POA: Diagnosis not present

## 2022-11-16 DIAGNOSIS — M810 Age-related osteoporosis without current pathological fracture: Secondary | ICD-10-CM | POA: Diagnosis not present

## 2022-11-16 DIAGNOSIS — G4733 Obstructive sleep apnea (adult) (pediatric): Secondary | ICD-10-CM | POA: Diagnosis not present

## 2022-11-16 DIAGNOSIS — E559 Vitamin D deficiency, unspecified: Secondary | ICD-10-CM | POA: Diagnosis not present

## 2022-11-16 DIAGNOSIS — J45909 Unspecified asthma, uncomplicated: Secondary | ICD-10-CM | POA: Diagnosis not present

## 2022-11-24 ENCOUNTER — Other Ambulatory Visit: Payer: Self-pay | Admitting: Internal Medicine

## 2022-12-01 DIAGNOSIS — H2511 Age-related nuclear cataract, right eye: Secondary | ICD-10-CM | POA: Diagnosis not present

## 2022-12-15 ENCOUNTER — Telehealth (INDEPENDENT_AMBULATORY_CARE_PROVIDER_SITE_OTHER): Payer: Self-pay | Admitting: Ophthalmology

## 2022-12-15 MED ORDER — GATIFLOXACIN 0.5 % OP SOLN: 1.0000 [drp] | Freq: Four times a day (QID) | OPHTHALMIC | 0 refills | Status: DC

## 2022-12-15 NOTE — Telephone Encounter (Signed)
Dr. Vonna Kotyk pt requesting medication refill of gatifloxacin for cataract pre-op to [facility] on [address]. E-script sent.  Karie Chimera, M.D., Ph.D. Diseases & Surgery of the Retina and Vitreous [facility]

## 2023-01-04 ENCOUNTER — Other Ambulatory Visit: Payer: Self-pay | Admitting: Internal Medicine

## 2023-01-08 MED ORDER — CROMOLYN SODIUM 5.2 MG/ACT NA AERS: INHALATION_SPRAY | NASAL | 12 refills | Status: DC

## 2023-01-08 NOTE — Telephone Encounter (Signed)
Nasalcrom refilled

## 2023-02-06 ENCOUNTER — Encounter: Payer: Self-pay | Admitting: Podiatry

## 2023-02-06 ENCOUNTER — Ambulatory Visit: Payer: Medicare PPO | Admitting: Podiatry

## 2023-02-06 VITALS — BP 154/92

## 2023-02-06 DIAGNOSIS — M79676 Pain in unspecified toe(s): Secondary | ICD-10-CM

## 2023-02-06 DIAGNOSIS — B351 Tinea unguium: Secondary | ICD-10-CM | POA: Diagnosis not present

## 2023-02-08 ENCOUNTER — Encounter (HOSPITAL_BASED_OUTPATIENT_CLINIC_OR_DEPARTMENT_OTHER): Payer: Self-pay | Admitting: Obstetrics & Gynecology

## 2023-02-08 ENCOUNTER — Ambulatory Visit (INDEPENDENT_AMBULATORY_CARE_PROVIDER_SITE_OTHER): Payer: Medicare PPO | Admitting: Obstetrics & Gynecology

## 2023-02-08 ENCOUNTER — Other Ambulatory Visit (HOSPITAL_COMMUNITY)
Admission: RE | Admit: 2023-02-08 | Discharge: 2023-02-08 | Disposition: A | Discharge status: Home / Self Care | Payer: Medicare PPO | Source: Ambulatory Visit | Attending: Obstetrics & Gynecology | Admitting: Obstetrics & Gynecology

## 2023-02-08 VITALS — BP 150/90 | HR 62 | Ht 64.0 in | Wt 191.0 lb

## 2023-02-08 DIAGNOSIS — I10 Essential (primary) hypertension: Secondary | ICD-10-CM | POA: Diagnosis not present

## 2023-02-08 DIAGNOSIS — Z9189 Other specified personal risk factors, not elsewhere classified: Secondary | ICD-10-CM | POA: Diagnosis not present

## 2023-02-08 DIAGNOSIS — B009 Herpesviral infection, unspecified: Secondary | ICD-10-CM | POA: Diagnosis not present

## 2023-02-08 DIAGNOSIS — N3941 Urge incontinence: Secondary | ICD-10-CM

## 2023-02-08 DIAGNOSIS — B977 Papillomavirus as the cause of diseases classified elsewhere: Secondary | ICD-10-CM

## 2023-02-08 DIAGNOSIS — R7303 Prediabetes: Secondary | ICD-10-CM | POA: Insufficient documentation

## 2023-02-08 DIAGNOSIS — Z659 Problem related to unspecified psychosocial circumstances: Secondary | ICD-10-CM | POA: Diagnosis not present

## 2023-02-08 DIAGNOSIS — M810 Age-related osteoporosis without current pathological fracture: Secondary | ICD-10-CM | POA: Diagnosis not present

## 2023-02-08 DIAGNOSIS — R87810 Cervical high risk human papillomavirus (HPV) DNA test positive: Secondary | ICD-10-CM | POA: Diagnosis not present

## 2023-02-08 MED ORDER — VALACYCLOVIR HCL 500 MG PO TABS: 500.0000 mg | ORAL_TABLET | Freq: Every day | ORAL | 3 refills | Status: DC

## 2023-02-08 NOTE — Progress Notes (Signed)
72 y.o. G1P1 Married White or Caucasian female here for breast and pelvic exam.  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  Did have negative follow up.  Last pap with neg HR HPV in 2021.  Denies vaginal bleeding.  On prolia.  Will have follow up with Dr. Talmage Nap.  Is going to bone density at her office.    Husband had Merkel cell cancer last year.  He was treated with surgery and then radiation.  Surgery was done in [facility].  Radiation was done here locally.  He has some symptoms concerning for Parkinson's.    She did end up having hip revision last October.    Patient's last menstrual period was 09/05/2003.          Sexually active: No.  H/O STD:  h/o HSV, h/o HR HPV  Health Maintenance: PCP:  Dr. Corliss Blacker.  Last wellness appt was 2023.  Did blood work at that appt:  yes.  Has blood work scheduled for upcoming appt. Vaccines are up to date:  yes, had not had a Covid vaccine this year Colonoscopy:  09/27/2022 MMG:  07/13/2022 Negative BMD:  06/02/2021 Osteoporosis Last pap smear:  09/18/2019 Negative.   H/o abnormal pap smear:  h/o HR HPV   reports that she has never smoked. She has never used smokeless tobacco. She reports current alcohol use. She reports that she does not use drugs.  Past Medical History:  Diagnosis Date   Acid reflux    Anemia    "during the time I was taking meloxicam"   Arthritis    Asthma    LOV  6/13  Dr Maple Hudson EPIC/ clearance on chart  from 10/12   Depression    Dysrhythmia    PVC's with caffeine and anxiety   Heart murmur    since childhood   Hiatal hernia    High cholesterol    Hypertension    OV with clearance and note Dr Corliss Blacker 6/13 chart   Osteoporosis    Sleep apnea    no CPAP/ last study 9 yrs ago- "mild per patient"   STD (sexually transmitted disease)    HSV2   Stye 06/2009   eye    Past Surgical History:  Procedure Laterality Date   ACETABULAR REVISION Right 06/07/2022   Procedure: Right hip constrained liner  versus dual mobility cup;  Surgeon: Ollen Gross, MD;  Location: [facility];  Service: Orthopedics;  Laterality: Right;   bilateral hip replacement Bilateral 6/07, 9/07   right, then left   BREAST REDUCTION SURGERY     CESAREAN SECTION     FRACTURE SURGERY     Left leg-1977   NASAL SEPTOPLASTY W/ TURBINOPLASTY Bilateral 07/19/2018   Procedure: NASAL SEPTOPLASTY WITH TURBINATE REDUCTION;  Surgeon: Osborn Coho, MD;  Location: [facility] OR;  Service: ENT;  Laterality: Bilateral;   NASAL SEPTUM SURGERY     REDUCTION MAMMAPLASTY     TOTAL KNEE ARTHROPLASTY  03/18/2012   Procedure: TOTAL KNEE ARTHROPLASTY;  Surgeon: Loanne Drilling, MD;  Location: [facility];  Service: Orthopedics;  Laterality: Left;   TOTAL KNEE ARTHROPLASTY Right 07/23/2017   Procedure: RIGHT TOTAL KNEE ARTHROPLASTY;  Surgeon: Ollen Gross, MD;  Location: [facility];  Service: Orthopedics;  Laterality: Right;    Current Outpatient Medications  Medication Sig Dispense Refill   albuterol (VENTOLIN HFA) 108 (90 Base) MCG/ACT inhaler INHALE 2 PUFFS INTO THE LUNGS EVERY 4 HOURS AS NEEDED FOR WHEEZE  OR FOR SHORTNESS OF BREATH 18 each 11   amLODipine (NORVASC) 5 MG tablet Take 5 mg by mouth in the morning.     atorvastatin (LIPITOR) 40 MG tablet Take 40 mg by mouth at bedtime.     azelastine (ASTELIN) 0.1 % nasal spray Place 1 spray into both nostrils in the morning. Use in each nostril as directed     B Complex-C (B-COMPLEX WITH VITAMIN C) tablet Take 1 tablet by mouth once a week.     Calcium Carb-Cholecalciferol (CALCIUM + VITAMIN D3 PO) Take 1 tablet by mouth at bedtime.     cetirizine (ZYRTEC) 10 MG tablet Take 10 mg by mouth daily as needed for allergies.      Cholecalciferol (VITAMIN D3 PO) Take 1 capsule by mouth in the morning.     cloNIDine (CATAPRES) 0.1 MG tablet Take 0.1 mg by mouth every evening.     cromolyn (NASALCROM) 5.2 MG/ACT nasal spray PLACE 1 SPRAY INTO BOTH NOSTRILS DAILY AS NEEDED FOR ALLERGIES. 26 mL 12   denosumab  (PROLIA) 60 MG/ML SOSY injection Inject 60 mg into the skin every 6 (six) months.     docusate sodium (COLACE) 100 MG capsule Take 100 mg by mouth at bedtime.     esomeprazole (NEXIUM) 40 MG capsule Take 40 mg by mouth daily before breakfast.     Eszopiclone 3 MG TABS Take 3 mg by mouth at bedtime. Take immediately before bedtime     fluticasone (FLONASE) 50 MCG/ACT nasal spray PLACE 2 SPRAYS INTO BOTH NOSTRILS AT BEDTIME. (Patient taking differently: Place 1 spray into both nostrils at bedtime.) 48 mL 3   Fluticasone-Umeclidin-Vilant (TRELEGY ELLIPTA) 100-62.5-25 MCG/ACT AEPB TAKE 1 PUFF BY MOUTH EVERY DAY (Patient taking differently: Inhale 1 puff into the lungs every evening.) 60 each 11   Magnesium 500 MG TABS Take 500 mg by mouth at bedtime.     melatonin 3 MG TABS tablet Take 3 mg by mouth at bedtime as needed (sleep).     methocarbamol (ROBAXIN) 500 MG tablet Take 1 tablet (500 mg total) by mouth every 6 (six) hours as needed for muscle spasms. 40 tablet 0   montelukast (SINGULAIR) 10 MG tablet TAKE 1 TABLET BY MOUTH EVERY DAY 90 tablet 3   Prenatal Multivit-Min-Fe-FA (PRENATAL 1 + IRON PO) Take 1 tablet by mouth in the morning.     Probiotic Product (PROBIOTIC PO) Take 1 capsule by mouth every evening.     valACYclovir (VALTREX) 500 MG tablet Take 1 tablet (500 mg total) by mouth daily. Increase to bid x 3 days with outbreaks. (Patient taking differently: Take 500 mg by mouth every other day. In the morning) 90 tablet 3   No current facility-administered medications for this visit.    Family History  Problem Relation Age of Onset   Heart disease Father    Cancer Father    Hypertension Father    Kidney Stones Father    Arthritis Mother    Hypertension Mother    Pancreatic cancer Other    Coronary artery disease Other    Cancer Other    Asthma Other    Nephritis Daughter        glomerular    Review of Systems  Constitutional: Negative.   Genitourinary: Negative.     Exam:    BP (!) 150/90   Pulse 62   Ht 5\' 4"  (1.626 m) Comment: Reported  Wt 191 lb (86.6 kg)   LMP 09/05/2003   BMI  32.79 kg/m   Height: 5\' 4"  (162.6 cm) (Reported)  General appearance: alert, cooperative and appears stated age Breasts: normal appearance, no masses or tenderness Abdomen: soft, non-tender; bowel sounds normal; no masses,  no organomegaly Lymph nodes: Cervical, supraclavicular, and axillary nodes normal.  No abnormal inguinal nodes palpated Neurologic: Grossly normal  Pelvic: External genitalia:  no lesions              Urethra:  normal appearing urethra with no masses, tenderness or lesions              Bartholins and Skenes: normal                 Vagina: normal appearing vagina with atrophic changes and no discharge, no lesions              Cervix: no lesions              Pap taken: Yes.   Bimanual Exam:  Uterus:  normal size, contour, position, consistency, mobility, non-tender              Adnexa: normal adnexa and no mass, fullness, tenderness               Rectovaginal: Confirms               Anus:  normal sphincter tone, no lesions  Chaperone, Ina Homes, CMA, was present for exam.  Assessment/Plan: 1. GYN exam for high-risk Medicare patient - Pap smear obtained today - Mammogram 07/13/2022 - Colonoscopy 09/27/2022 - Bone mineral density 06/02/2021 - lab work done with PCP, Dr. Corliss Blacker - vaccines reviewed/updated  2. Elevated systolic blood pressure reading with diagnosis of hypertension - she is going to follow up with Dr. Corliss Blacker  3. HSV-2 (herpes simplex virus 2) infection - does not need RF for valtrex at this time  4. Age-related osteoporosis without current pathological fracture - followed by Dr. Talmage Nap  5. High risk HPV infection - Cytology - PAP( [place])  6. Other social stressor - had two appts with Phillips Grout last year.  Discussed referral again this year.  This was placed again today.  7. Urge incontinence of urine

## 2023-02-11 NOTE — Progress Notes (Signed)
  Subjective:  Patient ID: [name], female    DOB: 04/05/1951,  MRN: [id number]  [name] presents to clinic today for painful elongated mycotic toenails 1-5 bilaterally which are tender when wearing enclosed shoe gear. Pain is relieved with periodic professional debridement. Chief Complaint  Patient presents with   Nail Problem    RFC,Referring Provider Theresa Dimitri, MD,LOV:03/24     :  New problem(s): None.   PCP is Theresa Dimitri, MD.  Allergies  Allergen Reactions   Meloxicam Other (See Comments)    Blood count went down    Meperidine Nausea And Vomiting and Other (See Comments)   Meperidine Hcl Other (See Comments)   Nadolol Other (See Comments)    Low blood pressure   Prevacid [Lansoprazole] Other (See Comments)    Unknown reaction (patient does not recall)    Review of Systems: Negative except as noted in the HPI. Objective:   Constitutional [name] is a pleasant 72 y.o. female, WD, WN in NAD. AAO x 3.   Vascular Vascular Examination: Capillary refill time immediate b/l. Vascular status intact b/l with palpable pedal pulses. Pedal hair present b/l. No edema. No pain with calf compression b/l. Skin temperature gradient WNL b/l. No cyanosis or clubbing b/l.   Neurological Examination: Sensation grossly intact b/l with 10 gram monofilament. Vibratory sensation intact b/l.   Dermatological Examination: Pedal skin with normal turgor, texture and tone b/l.  No open wounds. No interdigital macerations.   Toenails 1-5 b/l thick, discolored, elongated with subungual debris and pain on dorsal palpation.   No hyperkeratotic nor porokeratotic lesions present on today's visit.  Musculoskeletal Examination: Muscle strength 5/5 to all lower extremity muscle groups bilaterally. No pain, crepitus or joint limitation noted with ROM bilateral LE. No gross bony deformities bilaterally. Patient ambulates independent of any assistive aids.  Radiographs:  None   Assessment:   1. Pain due to onychomycosis of toenail    Plan:  Patient was evaluated and treated and all questions answered. Consent given for treatment as described below: -Patient was evaluated and treated. All patient's and/or POA's questions/concerns answered on today's visit. -Patient to continue soft, supportive shoe gear daily. -Mycotic toenails 1-5 bilaterally were debrided in length and girth with sterile nail nippers and dremel without incident. -Patient/POA to call should there be question/concern in the interim.  Return in about 3 months (around 05/09/2023).  [name], DPM

## 2023-02-12 ENCOUNTER — Other Ambulatory Visit: Payer: Self-pay | Admitting: Internal Medicine

## 2023-02-12 LAB — CYTOLOGY - PAP
Comment: NEGATIVE
Diagnosis: NEGATIVE
High risk HPV: NEGATIVE

## 2023-02-13 ENCOUNTER — Ambulatory Visit: Payer: Medicare PPO | Admitting: Internal Medicine

## 2023-02-13 ENCOUNTER — Encounter: Payer: Self-pay | Admitting: Internal Medicine

## 2023-02-13 VITALS — BP 124/72 | HR 61 | Ht 64.0 in | Wt 191.4 lb

## 2023-02-13 DIAGNOSIS — J209 Acute bronchitis, unspecified: Secondary | ICD-10-CM

## 2023-02-13 DIAGNOSIS — R7303 Prediabetes: Secondary | ICD-10-CM | POA: Diagnosis not present

## 2023-02-13 DIAGNOSIS — Z6831 Body mass index (BMI) 31.0-31.9, adult: Secondary | ICD-10-CM | POA: Diagnosis not present

## 2023-02-13 DIAGNOSIS — G4733 Obstructive sleep apnea (adult) (pediatric): Secondary | ICD-10-CM

## 2023-02-13 DIAGNOSIS — I10 Essential (primary) hypertension: Secondary | ICD-10-CM | POA: Diagnosis not present

## 2023-02-13 DIAGNOSIS — E669 Obesity, unspecified: Secondary | ICD-10-CM | POA: Diagnosis not present

## 2023-02-13 MED ORDER — AMOXICILLIN-POT CLAVULANATE 875-125 MG PO TABS: 1.0000 | ORAL_TABLET | Freq: Two times a day (BID) | ORAL | 0 refills | Status: DC

## 2023-02-13 MED ORDER — DOXYCYCLINE HYCLATE 100 MG PO TABS: 100.0000 mg | ORAL_TABLET | Freq: Two times a day (BID) | ORAL | 0 refills | Status: DC

## 2023-02-13 MED ORDER — ALBUTEROL SULFATE HFA 108 (90 BASE) MCG/ACT IN AERS: INHALATION_SPRAY | RESPIRATORY_TRACT | 11 refills | Status: DC

## 2023-02-13 NOTE — Patient Instructions (Signed)
Script sent for doxycycline antibiotic  Script printed to hold for augmentin to hold  Script sent refilling Ventolin hfa rescue inhaler  We can keep the September 30 appointment

## 2023-02-13 NOTE — Progress Notes (Unsigned)
Patient ID: [name], female    DOB: 1951/08/23, 72 y.o.   MRN: [id number]  HPI  F never smoker folowed for asthma, allergic rhinitis, complicated by hx OSA/ oral appliance, HBP, GERD PFT 05/30/10- FEV1 2.56/ 91%, FEV1/FVC 0.73, DLCO 84%  within normal limits. NPSG-04/21/14- Moderate obstructive sleep apnea, AHI 21/ hr, CPAP to 14, weight 185 lbs  -----------------------------------------------------------------  06/01/22- 72 year old female never smoker followed for Asthma, Allergic Rhinitis, OSA-failedCPAP/ oral appliance, complicated by HBP, GERD, OA,  -Singulair, Trelegy100, albuterol HFA, Lunesta Body weight today  Covid vax- 6 Phizer- pending next update Flu and RSV at drug store [facility] [place], NP 01/02/22- Asthma exacerb. FENO wnl,  Pending revision R THA- repeated dislocation. HST on hold/ pending -----Pt f/u her asthma is going well she has no concerns.  Stable on Trelegy. No pulmonary concerns ahead of hip surgery. Husband having XRT for Merkle Cell cancer.  02/13/23- 72 year old female never smoker followed for Asthma, Allergic Rhinitis, OSA-failedCPAP/ oral appliance, complicated by HBP, GERD, OA,  -Singulair, Trelegy100, albuterol HFA, Lunesta Body weight today 191 lbs -----Patient complains of productive cough with dark yellow phlegm, chest congestion, sob. Symptoms started 4 days ago She had taken leftover Augmentin for 3 days.  She says several of her close family contacts all apparently have the same illness.  Usually her inhalers do well.  She is having to use rescue inhaler at least twice daily now.  Review of Systems- see HPI  + = positive Constitutional:   +  weight loss- see HPI, night sweats, fevers, chills, +fatigue, lassitude. HEENT:   +  headaches, difficulty swallowing, tooth/dental problems, sore throat,       No-  sneezing, itching, ear ache, +nasal congestion, post nasal drip,  CV:  No-   chest pain, orthopnea, PND, swelling in lower extremities, anasarca,   dizziness, palpitations Resp: No-   shortness of breath with exertion or at rest.              No-   productive cough, +non-productive cough,  No- coughing up of blood.              No-   change in color of mucus. +wheezing.   Skin: No-   rash or lesions. GI:  +heartburn, indigestion, abdominal pain, nausea, vomiting, GU: . MS:  See HPI- + joint pain or swelling.   Neuro-     nothing unusual Psych:  No- change in mood or affect. No depression or anxiety.  No memory loss.  OBJ General- Alert, Oriented, Affect-appropriate, Distress- none acute. + Overweight Skin- rash-none, lesions- none, excoriation- none Lymphadenopathy- none Head- atraumatic            Eyes- Gross vision intact, PERRLA, conjunctivae clear            Ears- Hearing, canals-normal            Nose- mucus, no-polyps, erosion, perforation             Throat- Mallampati III , mucosa clear , drainage- none, tonsils- atrophic, own teeth Neck- flexible , trachea midline, no stridor , thyroid nl, carotid no bruit Chest - symmetrical excursion , unlabored           Heart/CV- RRR , no murmur , no gallop  , no rub, nl s1 s2                           - JVD- none ,  edema- none, stasis changes- none, varices- none           Lung-  Wheeze+, cough+ , dullness-none, rub- none           Chest wall-  Abd-  Br/ Gen/ Rectal- Not done, not indicated Extrem- cyanosis- none, clubbing, none, atrophy- none, strength- nl Neuro- grossly intact to observation

## 2023-02-14 ENCOUNTER — Encounter: Payer: Self-pay | Admitting: Internal Medicine

## 2023-02-14 DIAGNOSIS — M810 Age-related osteoporosis without current pathological fracture: Secondary | ICD-10-CM | POA: Diagnosis not present

## 2023-02-14 DIAGNOSIS — E559 Vitamin D deficiency, unspecified: Secondary | ICD-10-CM | POA: Diagnosis not present

## 2023-02-14 NOTE — Assessment & Plan Note (Signed)
Acute asthmatic bronchitis, likely viral initially. Plan-doxycycline, refill Ventolin rescue inhaler.  Printed prescription for Augmentin to hold as requested.

## 2023-02-14 NOTE — Assessment & Plan Note (Signed)
Encourage continued efforts at weight control and reminder to sleep off flat of back

## 2023-02-20 DIAGNOSIS — M810 Age-related osteoporosis without current pathological fracture: Secondary | ICD-10-CM | POA: Diagnosis not present

## 2023-02-20 DIAGNOSIS — E559 Vitamin D deficiency, unspecified: Secondary | ICD-10-CM | POA: Diagnosis not present

## 2023-02-20 DIAGNOSIS — I10 Essential (primary) hypertension: Secondary | ICD-10-CM | POA: Diagnosis not present

## 2023-02-20 DIAGNOSIS — R634 Abnormal weight loss: Secondary | ICD-10-CM | POA: Diagnosis not present

## 2023-02-22 DIAGNOSIS — Z6832 Body mass index (BMI) 32.0-32.9, adult: Secondary | ICD-10-CM | POA: Diagnosis not present

## 2023-02-22 DIAGNOSIS — I10 Essential (primary) hypertension: Secondary | ICD-10-CM | POA: Diagnosis not present

## 2023-02-23 DIAGNOSIS — M810 Age-related osteoporosis without current pathological fracture: Secondary | ICD-10-CM | POA: Diagnosis not present

## 2023-03-05 DIAGNOSIS — M25532 Pain in left wrist: Secondary | ICD-10-CM | POA: Diagnosis not present

## 2023-03-05 DIAGNOSIS — M810 Age-related osteoporosis without current pathological fracture: Secondary | ICD-10-CM | POA: Diagnosis not present

## 2023-03-05 DIAGNOSIS — M19032 Primary osteoarthritis, left wrist: Secondary | ICD-10-CM | POA: Diagnosis not present

## 2023-03-05 DIAGNOSIS — M25562 Pain in left knee: Secondary | ICD-10-CM | POA: Diagnosis not present

## 2023-03-05 NOTE — BH Specialist Note (Deleted)
Integrated Behavioral Health via Telemedicine Visit  03/05/2023 LECIA AMANTE [id number]  Number of Integrated Behavioral Health Clinician visits: 2- Second Visit  Session Start time: 1047   Session End time: 1132  Total time in minutes: 45   Referring Provider: *** Patient/Family location: [facility] Provider location: *** All persons participating in visit: *** Types of Service: {CHL AMB TYPE OF SERVICE:[phone]}  I connected with [name] and/or Charlcie Cradle Millikan's {family members:20773} via  Telephone or Video Enabled Telemedicine Application  (Video is Caregility application) and verified that I am speaking with the correct person using two identifiers. Discussed confidentiality: {YES/NO:21197}  I discussed the limitations of telemedicine and the availability of in person appointments.  Discussed there is a possibility of technology failure and discussed alternative modes of communication if that failure occurs.  I discussed that engaging in this telemedicine visit, they consent to the provision of behavioral healthcare and the services will be billed under their insurance.  Patient and/or legal guardian expressed understanding and consented to Telemedicine visit: {YES/NO:21197}  Presenting Concerns: Patient and/or family reports the following symptoms/concerns: *** Duration of problem: ***; Severity of problem: {Mild/Moderate/Severe:20260}  Patient and/or Family's Strengths/Protective Factors: {CHL AMB BH PROTECTIVE FACTORS:[phone]}  Goals Addressed: Patient will:  Reduce symptoms of: {IBH Symptoms:21014056}   Increase knowledge and/or ability of: {IBH Patient Tools:21014057}   Demonstrate ability to: {IBH Goals:21014053}  Progress towards Goals: {CHL AMB BH PROGRESS TOWARDS GOALS:[phone]}  Interventions: Interventions utilized:  {IBH Interventions:21014054} Standardized Assessments completed: {IBH Screening Tools:21014051}  Patient and/or Family  Response: ***  Assessment: Patient currently experiencing ***.   Patient may benefit from ***.  Plan: Follow up with behavioral health clinician on : *** Behavioral recommendations: *** Referral(s): {IBH Referrals:21014055}  I discussed the assessment and treatment plan with the patient and/or parent/guardian. They were provided an opportunity to ask questions and all were answered. They agreed with the plan and demonstrated an understanding of the instructions.   They were advised to call back or seek an in-person evaluation if the symptoms worsen or if the condition fails to improve as anticipated.  Valetta Close Storm Dulski, LCSW

## 2023-03-16 DIAGNOSIS — M13832 Other specified arthritis, left wrist: Secondary | ICD-10-CM | POA: Diagnosis not present

## 2023-03-16 DIAGNOSIS — M25562 Pain in left knee: Secondary | ICD-10-CM | POA: Diagnosis not present

## 2023-03-22 ENCOUNTER — Other Ambulatory Visit: Payer: Self-pay | Admitting: Internal Medicine

## 2023-03-29 NOTE — BH Specialist Note (Addendum)
Integrated Behavioral Health via Telemedicine Visit  08/07/2023 [name] [id number]  Number of Integrated Behavioral Health Clinician visits: 2- Second Visit  Session Start time: 1018   Session End time: 1102  Total time in minutes: 44   Referring Provider: Valentina Shaggy, MD Patient/Family location: Home [facility] Provider location: [facility]  All persons participating in visit: [name] and [name]   Types of Service: Individual psychotherapy and Video visit  I connected with [name] and/or [name]'s  n/a  via  Telephone or Video Enabled Telemedicine Application  (Video is Caregility application) and verified that I am speaking with the correct person using two identifiers. Discussed confidentiality: Yes   I discussed the limitations of telemedicine and the availability of in person appointments.  Discussed there is a possibility of technology failure and discussed alternative modes of communication if that failure occurs.  I discussed that engaging in this telemedicine visit, they consent to the provision of behavioral healthcare and the services will be billed under their insurance.  Patient and/or legal guardian expressed understanding and consented to Telemedicine visit: Yes   Presenting Concerns: Patient and/or family reports the following symptoms/concerns: Increasing fatigue, sleep difficulty, and weight gain,  attributes to adjusting to her and husband's health issues, along with stress of having a hard time completing household de-cluttering project. Pt's priorities for the next three months are: improved health and completing de-cluttering project.  Duration of problem: Ongoing; Severity of problem: moderate  Patient and/or Family's Strengths/Protective Factors: Social connections, Concrete supports in place (healthy food, safe environments, etc.), Sense of purpose, and  Physical Health (exercise, healthy diet, medication compliance, etc.)  Goals Addressed: Patient will:  Reduce symptoms of: anxiety and stress   Increase knowledge and/or ability of: self-management skills and stress reduction   Demonstrate ability to: Increase healthy adjustment to current life circumstances and Increase motivation to adhere to plan of care  Progress towards Goals: Ongoing  Interventions: Interventions utilized:  Solution-Focused Strategies Standardized Assessments completed: GAD-7 and PHQ 9  Patient and/or Family Response: Patient agrees with treatment plan.   Assessment: Patient currently experiencing Adjustment disorder with anxious mood.   Patient may benefit from psychoeducation and brief therapeutic interventions regarding coping with symptoms of anxiety and current life stress .  Plan: Follow up with behavioral health clinician on : Three months; Call Bergen Melle at [phone], as needed. Behavioral recommendations:  -Continue following medical recommendations for optimal health  -Continue healthy self-care using meditation apps, walking 30 minutes daily, spending quality time with husband and friends, participating in bridge games and book club -Switch from daily gratitudes to weekly (something to look forward to instead of a chore to complete) -Create visual board of de-cluttering project, with three month goal completion date at the top. List detailed tasks to be completed. Divide tasks into 12 weeks; focus on one week at a time.  -Consider starting with bedroom, to create a more peaceful space for earlier bedtime.  Referral(s): Integrated [employer] (In Clinic)  I discussed the assessment and treatment plan with the patient and/or parent/guardian. They were provided an opportunity to ask questions and all were answered. They agreed with the plan and demonstrated an understanding of the instructions.   They were advised to call back or seek  an in-person evaluation if the symptoms worsen or if the condition fails to improve as anticipated.  [name], LCSW    04/12/2023  9:36 AM 02/02/2022    2:58 PM  Depression screen PHQ 2/9  Decreased Interest 0 0  Down, Depressed, Hopeless 1 0  PHQ - 2 Score 1 0  Altered sleeping 3   Tired, decreased energy 3   Change in appetite 0   Feeling bad or failure about yourself  0   Trouble concentrating 0   Moving slowly or fidgety/restless 0   Suicidal thoughts 0   PHQ-9 Score 7       04/12/2023    9:45 AM  GAD 7 : Generalized Anxiety Score  Nervous, Anxious, on Edge 1  Control/stop worrying 0  Worry too much - different things 0  Trouble relaxing 0  Restless 0  Easily annoyed or irritable 0  Afraid - awful might happen 0  Total GAD 7 Score 1

## 2023-04-01 ENCOUNTER — Other Ambulatory Visit: Payer: Self-pay | Admitting: Internal Medicine

## 2023-04-04 DIAGNOSIS — Z6830 Body mass index (BMI) 30.0-30.9, adult: Secondary | ICD-10-CM | POA: Diagnosis not present

## 2023-04-04 DIAGNOSIS — I10 Essential (primary) hypertension: Secondary | ICD-10-CM | POA: Diagnosis not present

## 2023-04-04 DIAGNOSIS — E669 Obesity, unspecified: Secondary | ICD-10-CM | POA: Diagnosis not present

## 2023-04-04 DIAGNOSIS — R7303 Prediabetes: Secondary | ICD-10-CM | POA: Diagnosis not present

## 2023-04-04 DIAGNOSIS — R110 Nausea: Secondary | ICD-10-CM | POA: Diagnosis not present

## 2023-04-12 ENCOUNTER — Ambulatory Visit (INDEPENDENT_AMBULATORY_CARE_PROVIDER_SITE_OTHER): Payer: Medicare PPO | Admitting: Clinical

## 2023-04-12 DIAGNOSIS — F4322 Adjustment disorder with anxiety: Secondary | ICD-10-CM | POA: Diagnosis not present

## 2023-04-12 NOTE — Patient Instructions (Signed)
[facility] [address] [place], [name] [address] [phone] (main office) [phone] (Exzavier Ruderman's office)   

## 2023-04-28 DIAGNOSIS — E785 Hyperlipidemia, unspecified: Secondary | ICD-10-CM | POA: Diagnosis not present

## 2023-04-28 DIAGNOSIS — K219 Gastro-esophageal reflux disease without esophagitis: Secondary | ICD-10-CM | POA: Diagnosis not present

## 2023-04-28 DIAGNOSIS — M810 Age-related osteoporosis without current pathological fracture: Secondary | ICD-10-CM | POA: Diagnosis not present

## 2023-04-28 DIAGNOSIS — I129 Hypertensive chronic kidney disease with stage 1 through stage 4 chronic kidney disease, or unspecified chronic kidney disease: Secondary | ICD-10-CM | POA: Diagnosis not present

## 2023-04-28 DIAGNOSIS — K5900 Constipation, unspecified: Secondary | ICD-10-CM | POA: Diagnosis not present

## 2023-04-28 DIAGNOSIS — F419 Anxiety disorder, unspecified: Secondary | ICD-10-CM | POA: Diagnosis not present

## 2023-04-28 DIAGNOSIS — M1990 Unspecified osteoarthritis, unspecified site: Secondary | ICD-10-CM | POA: Diagnosis not present

## 2023-04-28 DIAGNOSIS — G4733 Obstructive sleep apnea (adult) (pediatric): Secondary | ICD-10-CM | POA: Diagnosis not present

## 2023-04-28 DIAGNOSIS — I2510 Atherosclerotic heart disease of native coronary artery without angina pectoris: Secondary | ICD-10-CM | POA: Diagnosis not present

## 2023-04-28 DIAGNOSIS — J45909 Unspecified asthma, uncomplicated: Secondary | ICD-10-CM | POA: Diagnosis not present

## 2023-05-10 DIAGNOSIS — Z6829 Body mass index (BMI) 29.0-29.9, adult: Secondary | ICD-10-CM | POA: Diagnosis not present

## 2023-05-10 DIAGNOSIS — Z8639 Personal history of other endocrine, nutritional and metabolic disease: Secondary | ICD-10-CM | POA: Diagnosis not present

## 2023-05-10 DIAGNOSIS — R7309 Other abnormal glucose: Secondary | ICD-10-CM | POA: Diagnosis not present

## 2023-05-10 DIAGNOSIS — E559 Vitamin D deficiency, unspecified: Secondary | ICD-10-CM | POA: Diagnosis not present

## 2023-05-10 DIAGNOSIS — E782 Mixed hyperlipidemia: Secondary | ICD-10-CM | POA: Diagnosis not present

## 2023-05-10 DIAGNOSIS — Z79899 Other long term (current) drug therapy: Secondary | ICD-10-CM | POA: Diagnosis not present

## 2023-05-10 DIAGNOSIS — R7303 Prediabetes: Secondary | ICD-10-CM | POA: Diagnosis not present

## 2023-05-10 DIAGNOSIS — I10 Essential (primary) hypertension: Secondary | ICD-10-CM | POA: Diagnosis not present

## 2023-05-10 DIAGNOSIS — E663 Overweight: Secondary | ICD-10-CM | POA: Diagnosis not present

## 2023-05-10 DIAGNOSIS — R110 Nausea: Secondary | ICD-10-CM | POA: Diagnosis not present

## 2023-05-16 ENCOUNTER — Encounter: Payer: Self-pay | Admitting: Podiatry

## 2023-05-16 ENCOUNTER — Ambulatory Visit: Payer: Medicare PPO | Admitting: Podiatry

## 2023-05-16 DIAGNOSIS — B351 Tinea unguium: Secondary | ICD-10-CM

## 2023-05-16 DIAGNOSIS — R634 Abnormal weight loss: Secondary | ICD-10-CM | POA: Diagnosis not present

## 2023-05-16 DIAGNOSIS — M810 Age-related osteoporosis without current pathological fracture: Secondary | ICD-10-CM | POA: Diagnosis not present

## 2023-05-16 DIAGNOSIS — E559 Vitamin D deficiency, unspecified: Secondary | ICD-10-CM | POA: Diagnosis not present

## 2023-05-16 DIAGNOSIS — M79676 Pain in unspecified toe(s): Secondary | ICD-10-CM | POA: Diagnosis not present

## 2023-05-17 DIAGNOSIS — G4733 Obstructive sleep apnea (adult) (pediatric): Secondary | ICD-10-CM | POA: Diagnosis not present

## 2023-05-17 DIAGNOSIS — J45909 Unspecified asthma, uncomplicated: Secondary | ICD-10-CM | POA: Diagnosis not present

## 2023-05-17 DIAGNOSIS — R7303 Prediabetes: Secondary | ICD-10-CM | POA: Diagnosis not present

## 2023-05-17 DIAGNOSIS — E559 Vitamin D deficiency, unspecified: Secondary | ICD-10-CM | POA: Diagnosis not present

## 2023-05-17 DIAGNOSIS — I10 Essential (primary) hypertension: Secondary | ICD-10-CM | POA: Diagnosis not present

## 2023-05-17 DIAGNOSIS — K219 Gastro-esophageal reflux disease without esophagitis: Secondary | ICD-10-CM | POA: Diagnosis not present

## 2023-05-17 DIAGNOSIS — E782 Mixed hyperlipidemia: Secondary | ICD-10-CM | POA: Diagnosis not present

## 2023-05-17 DIAGNOSIS — M810 Age-related osteoporosis without current pathological fracture: Secondary | ICD-10-CM | POA: Diagnosis not present

## 2023-05-17 DIAGNOSIS — Z23 Encounter for immunization: Secondary | ICD-10-CM | POA: Diagnosis not present

## 2023-05-20 NOTE — Progress Notes (Signed)
Subjective:  Patient ID: [name], female    DOB: Dec 19, 1950,  MRN: [id number]  72 y.o. female presents to clinic with  painful thick toenails that are difficult to trim. Pain interferes with ambulation. Aggravating factors include wearing enclosed shoe gear. Pain is relieved with periodic professional debridement.  Chief Complaint  Patient presents with   RFC    RFC   New problem(s): None   PCP is Gweneth Dimitri, MD.  Allergies  Allergen Reactions   Meloxicam Other (See Comments)    Blood count went down    Meperidine Nausea And Vomiting and Other (See Comments)   Meperidine Hcl Other (See Comments)   Nadolol Other (See Comments)    Low blood pressure   Prevacid [Lansoprazole] Other (See Comments)    Unknown reaction (patient does not recall)    Review of Systems: Negative except as noted in the HPI.   Objective:  [name] is a pleasant 72 y.o. female WD, WN in NAD.Marland Kitchen  Vascular Examination: Vascular status intact b/l with palpable pedal pulses. CFT immediate b/l. No edema. No pain with calf compression b/l. Skin temperature gradient WNL b/l.   Neurological Examination: Sensation grossly intact b/l with 10 gram monofilament. Vibratory sensation intact b/l.   Dermatological Examination: Pedal skin with normal turgor, texture and tone b/l. Toenails 1-5 b/l thick, discolored, elongated with subungual debris and pain on dorsal palpation. No hyperkeratotic lesions noted b/l.   Musculoskeletal Examination: Muscle strength 5/5 to b/l LE. No pain, crepitus or joint limitation noted with ROM bilateral LE. No gross bony deformities bilaterally.  Radiographs: None  Assessment:   1. Pain due to onychomycosis of toenail    Plan:  -Consent given for treatment as described below: -Examined patient. -Continue supportive shoe gear daily. -Toenails 1-5 b/l were debrided in length and girth with sterile nail nippers and dremel without iatrogenic bleeding.   -Patient/POA to call should there be question/concern in the interim.  Return in about 3 months (around 08/15/2023).  [name], DPM

## 2023-05-22 ENCOUNTER — Other Ambulatory Visit (HOSPITAL_BASED_OUTPATIENT_CLINIC_OR_DEPARTMENT_OTHER): Payer: Self-pay

## 2023-05-22 MED ORDER — COMIRNATY 30 MCG/0.3ML IM SUSY
0.3000 mL | PREFILLED_SYRINGE | Freq: Once | INTRAMUSCULAR | 0 refills | Status: AC
  Filled 2023-05-22: qty 0.3, 1d supply, fill #0

## 2023-05-24 DIAGNOSIS — Z6829 Body mass index (BMI) 29.0-29.9, adult: Secondary | ICD-10-CM | POA: Diagnosis not present

## 2023-05-24 DIAGNOSIS — R300 Dysuria: Secondary | ICD-10-CM | POA: Diagnosis not present

## 2023-06-03 NOTE — Progress Notes (Signed)
Patient ID: [name], female    DOB: 1950-10-14, 72 y.o.   MRN: [id number]  HPI  F never smoker folowed for asthma, allergic rhinitis, complicated by hx OSA/ oral appliance, HBP, GERD PFT 05/30/10- FEV1 2.56/ 91%, FEV1/FVC 0.73, DLCO 84%  within normal limits. NPSG-04/21/14- Moderate obstructive sleep apnea, AHI 21/ hr, CPAP to 14, weight 185 lbs  -----------------------------------------------------------------   02/13/23- 72 year old female never smoker followed for Asthma, Allergic Rhinitis, OSA-failedCPAP/ oral appliance, complicated by HBP, GERD, OA,  -Singulair, Trelegy100, albuterol HFA, Lunesta Body weight today 191 lbs -----Patient complains of productive cough with dark yellow phlegm, chest congestion, sob. Symptoms started 4 days ago She had taken leftover Augmentin for 3 days.  She says several of her close family contacts all apparently have the same illness.  Usually her inhalers do well.  She is having to use rescue inhaler at least twice daily now.  06/04/23-  72 year old female never smoker followed for Asthma, Allergic Rhinitis, OSA-failedCPAP/ oral appliance, complicated by HBP, GERD, OA,  -Singulair, Trelegy100, albuterol HFA, Lunesta Body weight today 168 lbs Had flu vax at PCP   Had RSV vax last year. Doing well. Meds are sufficient. No changes or concerns.   Review of Systems- see HPI  + = positive Constitutional:   +  weight loss- see HPI, night sweats, fevers, chills, +fatigue, lassitude. HEENT:   +  headaches, difficulty swallowing, tooth/dental problems, sore throat,       No-  sneezing, itching, ear ache, +nasal congestion, post nasal drip,  CV:  No-   chest pain, orthopnea, PND, swelling in lower extremities, anasarca,  dizziness, palpitations Resp: No-   shortness of breath with exertion or at rest.              No-   productive cough, +non-productive cough,  No- coughing up of blood.              No-   change in color of mucus. +wheezing.   Skin:  No-   rash or lesions. GI:  +heartburn, indigestion, abdominal pain, nausea, vomiting, GU: . MS:  See HPI- + joint pain or swelling.   Neuro-     nothing unusual Psych:  No- change in mood or affect. No depression or anxiety.  No memory loss.  OBJ General- Alert, Oriented, Affect-appropriate, Distress- none acute.  + Overweight Skin- rash-none, lesions- none, excoriation- none Lymphadenopathy- none Head- atraumatic            Eyes- Gross vision intact, PERRLA, conjunctivae clear            Ears- Hearing, canals-normal            Nose- mucus, no-polyps, erosion, perforation             Throat- Mallampati III , mucosa clear , drainage- none, tonsils- atrophic, own teeth Neck- flexible , trachea midline, no stridor , thyroid nl, carotid no bruit Chest - symmetrical excursion , unlabored           Heart/CV- RRR , no murmur , no gallop  , no rub, nl s1 s2                           - JVD- none , edema- none, stasis changes- none, varices- none           Lung-  Wheeze-none, cough-none , dullness-none, rub- none  Chest wall-  Abd-  Br/ Gen/ Rectal- Not done, not indicated Extrem- cyanosis- none, clubbing, none, atrophy- none, strength- nl Neuro- grossly intact to observation

## 2023-06-04 ENCOUNTER — Ambulatory Visit: Payer: Medicare PPO | Admitting: Internal Medicine

## 2023-06-04 ENCOUNTER — Encounter: Payer: Self-pay | Admitting: Internal Medicine

## 2023-06-04 VITALS — BP 112/75 | HR 56 | Temp 97.6°F | Ht 64.0 in | Wt 168.8 lb

## 2023-06-04 DIAGNOSIS — J302 Other seasonal allergic rhinitis: Secondary | ICD-10-CM | POA: Diagnosis not present

## 2023-06-04 DIAGNOSIS — J4540 Moderate persistent asthma, uncomplicated: Secondary | ICD-10-CM | POA: Diagnosis not present

## 2023-06-04 DIAGNOSIS — J3089 Other allergic rhinitis: Secondary | ICD-10-CM | POA: Diagnosis not present

## 2023-06-04 MED ORDER — TRELEGY ELLIPTA 100-62.5-25 MCG/ACT IN AEPB: INHALATION_SPRAY | RESPIRATORY_TRACT | 11 refills | Status: DC

## 2023-06-04 MED ORDER — MONTELUKAST SODIUM 10 MG PO TABS: ORAL_TABLET | ORAL | 3 refills | Status: DC

## 2023-06-04 MED ORDER — AMOXICILLIN-POT CLAVULANATE 875-125 MG PO TABS: 1.0000 | ORAL_TABLET | Freq: Two times a day (BID) | ORAL | 2 refills | Status: DC

## 2023-06-04 MED ORDER — ALBUTEROL SULFATE HFA 108 (90 BASE) MCG/ACT IN AERS: INHALATION_SPRAY | RESPIRATORY_TRACT | 11 refills | Status: DC

## 2023-06-04 NOTE — Patient Instructions (Signed)
Refills sent  Please call if we can help 

## 2023-06-06 DIAGNOSIS — R7303 Prediabetes: Secondary | ICD-10-CM | POA: Diagnosis not present

## 2023-06-06 DIAGNOSIS — N3000 Acute cystitis without hematuria: Secondary | ICD-10-CM | POA: Diagnosis not present

## 2023-06-06 DIAGNOSIS — I10 Essential (primary) hypertension: Secondary | ICD-10-CM | POA: Diagnosis not present

## 2023-06-06 DIAGNOSIS — Z8639 Personal history of other endocrine, nutritional and metabolic disease: Secondary | ICD-10-CM | POA: Diagnosis not present

## 2023-06-06 DIAGNOSIS — R110 Nausea: Secondary | ICD-10-CM | POA: Diagnosis not present

## 2023-06-06 DIAGNOSIS — Z6827 Body mass index (BMI) 27.0-27.9, adult: Secondary | ICD-10-CM | POA: Diagnosis not present

## 2023-06-06 DIAGNOSIS — E663 Overweight: Secondary | ICD-10-CM | POA: Diagnosis not present

## 2023-06-08 ENCOUNTER — Other Ambulatory Visit (HOSPITAL_BASED_OUTPATIENT_CLINIC_OR_DEPARTMENT_OTHER): Payer: Self-pay | Admitting: Obstetrics & Gynecology

## 2023-06-08 ENCOUNTER — Telehealth (HOSPITAL_BASED_OUTPATIENT_CLINIC_OR_DEPARTMENT_OTHER): Payer: Self-pay | Admitting: *Deleted

## 2023-06-08 DIAGNOSIS — Z1231 Encounter for screening mammogram for malignant neoplasm of breast: Secondary | ICD-10-CM

## 2023-06-08 NOTE — Telephone Encounter (Signed)
Returned pts call. Advised that she is due for mammogram after 07/14/23. Informed pt that the order has been placed and she can call for scheduling. Pt verbalized understanding.

## 2023-06-08 NOTE — Telephone Encounter (Signed)
Patient called and left a message to see when she is due for her mammogram .And can someone please put the order in if she due .

## 2023-06-08 NOTE — Assessment & Plan Note (Signed)
Mild intermittent uncomplicated now Plan- refilling inhalers. Meds discussed.

## 2023-06-08 NOTE — Assessment & Plan Note (Signed)
Watching for seasonal flare with fall weather.

## 2023-06-12 ENCOUNTER — Ambulatory Visit: Payer: Medicare PPO | Admitting: Dermatology

## 2023-06-12 ENCOUNTER — Encounter: Payer: Self-pay | Admitting: Dermatology

## 2023-06-12 VITALS — BP 116/75

## 2023-06-12 DIAGNOSIS — L578 Other skin changes due to chronic exposure to nonionizing radiation: Secondary | ICD-10-CM

## 2023-06-12 DIAGNOSIS — L821 Other seborrheic keratosis: Secondary | ICD-10-CM

## 2023-06-12 DIAGNOSIS — Z808 Family history of malignant neoplasm of other organs or systems: Secondary | ICD-10-CM

## 2023-06-12 DIAGNOSIS — Z1283 Encounter for screening for malignant neoplasm of skin: Secondary | ICD-10-CM

## 2023-06-12 DIAGNOSIS — L570 Actinic keratosis: Secondary | ICD-10-CM | POA: Diagnosis not present

## 2023-06-12 DIAGNOSIS — L988 Other specified disorders of the skin and subcutaneous tissue: Secondary | ICD-10-CM

## 2023-06-12 DIAGNOSIS — D1801 Hemangioma of skin and subcutaneous tissue: Secondary | ICD-10-CM | POA: Diagnosis not present

## 2023-06-12 DIAGNOSIS — W908XXA Exposure to other nonionizing radiation, initial encounter: Secondary | ICD-10-CM | POA: Diagnosis not present

## 2023-06-12 DIAGNOSIS — D485 Neoplasm of uncertain behavior of skin: Secondary | ICD-10-CM

## 2023-06-12 DIAGNOSIS — L814 Other melanin hyperpigmentation: Secondary | ICD-10-CM

## 2023-06-12 DIAGNOSIS — D492 Neoplasm of unspecified behavior of bone, soft tissue, and skin: Secondary | ICD-10-CM | POA: Diagnosis not present

## 2023-06-12 DIAGNOSIS — D229 Melanocytic nevi, unspecified: Secondary | ICD-10-CM

## 2023-06-12 MED ORDER — HYDROQUINONE 4 % EX CREA: TOPICAL_CREAM | Freq: Every evening | CUTANEOUS | 2 refills | Status: DC

## 2023-06-12 NOTE — Progress Notes (Signed)
Follow-Up Visit   Subjective  [name] is a 72 y.o. female who presents for the following: Skin Cancer Screening and Full Body Skin Exam - no personal history of skin cancer. Family history of skin cancer in father and sister. Husband diagnosed with Merkle cell carcinoma last year.  The patient presents for Total-Body Skin Exam (TBSE) for skin cancer screening and mole check. The patient has spots, moles and lesions to be evaluated, some may be new or changing and the patient may have concern these could be cancer.    The following portions of the chart were reviewed this encounter and updated as appropriate: medications, allergies, medical history  Review of Systems:  No other skin or systemic complaints except as noted in HPI or Assessment and Plan.  Objective  Well appearing patient in no apparent distress; mood and affect are within normal limits.  A full examination was performed including scalp, head, eyes, ears, nose, lips, neck, chest, axillae, abdomen, back, buttocks, bilateral upper extremities, bilateral lower extremities, hands, feet, fingers, toes, fingernails, and toenails. All findings within normal limits unless otherwise noted below.   Relevant physical exam findings are noted in the Assessment and Plan.  Left Forearm 1.3 cm pink crusted papule         Assessment & Plan   SKIN CANCER SCREENING PERFORMED TODAY.  ACTINIC DAMAGE - Chronic condition, secondary to cumulative UV/sun exposure - diffuse scaly erythematous macules with underlying dyspigmentation - Recommend daily broad spectrum sunscreen SPF 30+ to sun-exposed areas, reapply every 2 hours as needed.  - Staying in the shade or wearing long sleeves, sun glasses (UVA+UVB protection) and wide brim hats (4-inch brim around the entire circumference of the hat) are also recommended for sun protection.  - Call for new or changing lesions.  SEBORRHEIC KERATOSES, HEMANGIOMAS - Benign normal skin  lesions - Benign-appearing - Call for any changes  MELANOCYTIC NEVI - Tan-brown and/or pink-flesh-colored symmetric macules and papules - Benign appearing on exam today - Observation - Call clinic for new or changing moles - Recommend daily use of broad spectrum spf 30+ sunscreen to sun-exposed areas.   VENOUS LAKE Exam: red or purple papule at right upper vermilion lip  Treatment Plan: Benign-appearing. Observe   LENTIGINES Exam: scattered tan macules of face Due to sun exposure Treatment Plan: Benign-appearing, observe. Recommend daily broad spectrum sunscreen SPF 30+ to sun-exposed areas, reapply every 2 hours as needed.  Call for any changes Start Hydroquinone 4% qhs   Neoplasm of uncertain behavior of skin Left Forearm  Skin / nail biopsy Type of biopsy: tangential   Informed consent: discussed and consent obtained   Timeout: patient name, date of birth, surgical site, and procedure verified   Procedure prep:  Patient was prepped and draped in usual sterile fashion Prep type:  Isopropyl alcohol Anesthesia: the lesion was anesthetized in a standard fashion   Anesthetic:  1% lidocaine w/ epinephrine 1-100,000 buffered w/ 8.4% NaHCO3 Instrument used: flexible razor blade   Hemostasis achieved with: pressure, aluminum chloride and electrodesiccation   Outcome: patient tolerated procedure well   Post-procedure details: sterile dressing applied and wound care instructions given   Dressing type: bandage and petrolatum    Specimen 1 - Surgical pathology Differential Diagnosis: SCC vs BCC vs other Check Margins: No   Return in about 1 year (around 06/11/2024) for TBSE.  I, Joanie Coddington, CMA, am acting as scribe for [employer], DO .   Documentation: I have reviewed the above  documentation for accuracy and completeness, and I agree with the above.  Langston Reusing, DO

## 2023-06-12 NOTE — Patient Instructions (Addendum)
Apply Hydroquinone 4% cream at bedtime for 3 months   Patient Handout: Wound Care for Skin Biopsy Site  Taking Care of Your Skin Biopsy Site  Proper care of the biopsy site is essential for promoting healing and minimizing scarring. This handout provides instructions on how to care for your biopsy site to ensure optimal recovery.  1. Cleaning the Wound:  Clean the biopsy site daily with gentle soap and water. Gently pat the area dry with a clean, soft towel. Avoid harsh scrubbing or rubbing the area, as this can irritate the skin and delay healing.  2. Applying Aquaphor and Bandage:  After cleaning the wound, apply a thin layer of Aquaphor ointment to the biopsy site. Cover the area with a sterile bandage to protect it from dirt, bacteria, and friction. Change the bandage daily or as needed if it becomes soiled or wet.  3. Continued Care for One Week:  Repeat the cleaning, Aquaphor application, and bandaging process daily for one week following the biopsy procedure. Keeping the wound clean and moist during this initial healing period will help prevent infection and promote optimal healing.  4. Massaging Aquaphor into the Area:  ---After one week, discontinue the use of bandages but continue to apply Aquaphor to the biopsy site. ----Gently massage the Aquaphor into the area using circular motions. ---Massaging the skin helps to promote circulation and prevent the formation of scar tissue.   Additional Tips:  Avoid exposing the biopsy site to direct sunlight during the healing process, as this can cause hyperpigmentation or worsen scarring. If you experience any signs of infection, such as increased redness, swelling, warmth, or drainage from the wound, contact your healthcare provider immediately. Follow any additional instructions provided by your healthcare provider for caring for the biopsy site and managing any discomfort. Conclusion:  Taking proper care of your skin biopsy  site is crucial for ensuring optimal healing and minimizing scarring. By following these instructions for cleaning, applying Aquaphor, and massaging the area, you can promote a smooth and successful recovery. If you have any questions or concerns about caring for your biopsy site, don't hesitate to contact your healthcare provider for guidance.     Important Information  Due to recent changes in healthcare laws, you may see results of your pathology and/or laboratory studies on MyChart before the doctors have had a chance to review them. We understand that in some cases there may be results that are confusing or concerning to you. Please understand that not all results are received at the same time and often the doctors may need to interpret multiple results in order to provide you with the best plan of care or course of treatment. Therefore, we ask that you please give [place] 2 business days to thoroughly review all your results before contacting the office for clarification. Should we see a critical lab result, you will be contacted sooner.   If You Need Anything After Your Visit  If you have any questions or concerns for your doctor, please call our main line at [phone] If no one answers, please leave a voicemail as directed and we will return your call as soon as possible. Messages left after 4 pm will be answered the following business day.   You may also send [place] a message via MyChart. We typically respond to MyChart messages within 1-2 business days.  For prescription refills, please ask your pharmacy to contact our office. Our fax number is [phone].  If you have an urgent issue  when the clinic is closed that cannot wait until the next business day, you can page your doctor at the number below.    Please note that while we do our best to be available for urgent issues outside of office hours, we are not available 24/7.   If you have an urgent issue and are unable to reach [place], you may  choose to seek medical care at your doctor's office, retail clinic, urgent care center, or emergency room.  If you have a medical emergency, please immediately call 911 or go to the emergency department. In the event of inclement weather, please call our main line at [phone] for an update on the status of any delays or closures.  Dermatology Medication Tips: Please keep the boxes that topical medications come in in order to help keep track of the instructions about where and how to use these. Pharmacies typically print the medication instructions only on the boxes and not directly on the medication tubes.   If your medication is too expensive, please contact our office at [phone] or send [place] a message through MyChart.   We are unable to tell what your co-pay for medications will be in advance as this is different depending on your insurance coverage. However, we may be able to find a substitute medication at lower cost or fill out paperwork to get insurance to cover a needed medication.   If a prior authorization is required to get your medication covered by your insurance company, please allow [place] 1-2 business days to complete this process.  Drug prices often vary depending on where the prescription is filled and some pharmacies may offer cheaper prices.  The website www.goodrx.com contains coupons for medications through different pharmacies. The prices here do not account for what the cost may be with help from insurance (it may be cheaper with your insurance), but the website can give you the price if you did not use any insurance.  - You can print the associated coupon and take it with your prescription to the pharmacy.  - You may also stop by our office during regular business hours and pick up a GoodRx coupon card.  - If you need your prescription sent electronically to a different pharmacy, notify our office through [facility] or by phone at [phone]

## 2023-06-13 LAB — SURGICAL PATHOLOGY

## 2023-06-14 NOTE — Progress Notes (Signed)
Hi [name]  Dr. Onalee Hua reviewed your biopsy results and they showed the spot removed was a little "abnormal" but not cancerous.  No additional treatment is required because it was removed by the biopsy.  We will continue to monitor the area for recurrence during your annual skin exams. The detailed report is available to view in MyChart.  Have a great day!  Kind Regards,  Dr. Kermit Balo Care Team

## 2023-06-20 ENCOUNTER — Encounter (HOSPITAL_BASED_OUTPATIENT_CLINIC_OR_DEPARTMENT_OTHER): Payer: Self-pay | Admitting: *Deleted

## 2023-07-02 NOTE — BH Specialist Note (Signed)
Integrated Behavioral Health via Telemedicine Visit  07/16/2023 [name] [id number]  Number of Integrated Behavioral Health Clinician visits: 1- Initial Visit  Session Start time: [phone]   Session End time: 1007  Total time in minutes: 51   Referring Provider: Valentina Shaggy, MD Patient/Family location: Home [facility] Provider location: [facility] for [facility]  All persons participating in visit: [name] and [facility] [name]   Types of Service: Individual psychotherapy and Video visit  I connected with [name] and/or [name]'s  n/a  via  Telephone or Video Enabled Telemedicine Application  (Video is Caregility application) and verified that I am speaking with the correct person using two identifiers. Discussed confidentiality: Yes   I discussed the limitations of telemedicine and the availability of in person appointments.  Discussed there is a possibility of technology failure and discussed alternative modes of communication if that failure occurs.  I discussed that engaging in this telemedicine visit, they consent to the provision of behavioral healthcare and the services will be billed under their insurance.  Patient and/or legal guardian expressed understanding and consented to Telemedicine visit: Yes   Presenting Concerns: Patient and/or family reports the following symptoms/concerns: Processing the progress made since last visit regarding household decluttering project and improved health measures, and the impact these efforts have had on pt's overall emotional wellness.  Duration of problem: Ongoing; Severity of problem: moderate  Patient and/or Family's Strengths/Protective Factors: Social connections, Social and Emotional competence, Concrete supports in place (healthy food, safe environments, etc.), Sense of purpose, and Physical Health (exercise, healthy diet, medication compliance, etc.)  Goals  Addressed: Patient will:  Reduce symptoms of: anxiety and stress   Increase knowledge and/or ability of: stress reduction   Demonstrate ability to: Increase healthy adjustment to current life circumstances and Increase motivation to adhere to plan of care  Progress towards Goals: Ongoing  Interventions: Interventions utilized:  Solution-Focused Strategies Standardized Assessments completed: Not Needed  Patient and/or Family Response: [name] and [facility] [name]    Assessment: Patient currently experiencing Adjustment disorder with anxious mood.   Patient may benefit from continued therapeutic intervention  .  Plan: Follow up with behavioral health clinician on : Call [name] at [phone], as needed. Behavioral recommendations:  -Continue daily healthy self-care (meditation apps, outdoor walks, quality time with husband and friends, bridge and book club participation and hosting; weekly gratitude; finding moments of awe; weekly trips to park of choice with husband) -Continue final parts of decluttering household project: clothes and paperwork, while maintaining household cleaning help -Consider sitting outdoors for 20 minutes on good-weather mornings for improved nighttime sleep and peaceful start to each day Referral(s): Integrated [employer] (In Clinic)  I discussed the assessment and treatment plan with the patient and/or parent/guardian. They were provided an opportunity to ask questions and all were answered. They agreed with the plan and demonstrated an understanding of the instructions.   They were advised to call back or seek an in-person evaluation if the symptoms worsen or if the condition fails to improve as anticipated.  Theresa Lips, LCSW     04/12/2023    9:36 AM 02/02/2022    2:58 PM  Depression screen PHQ 2/9  Decreased Interest 0 0  Down, Depressed, Hopeless 1 0  PHQ - 2 Score 1 0  Altered sleeping 3   Tired, decreased  energy 3   Change in appetite 0   Feeling bad  or failure about yourself  0   Trouble concentrating 0   Moving slowly or fidgety/restless 0   Suicidal thoughts 0   PHQ-9 Score 7       04/12/2023    9:45 AM  GAD 7 : Generalized Anxiety Score  Nervous, Anxious, on Edge 1  Control/stop worrying 0  Worry too much - different things 0  Trouble relaxing 0  Restless 0  Easily annoyed or irritable 0  Afraid - awful might happen 0  Total GAD 7 Score 1

## 2023-07-04 DIAGNOSIS — Z6828 Body mass index (BMI) 28.0-28.9, adult: Secondary | ICD-10-CM | POA: Diagnosis not present

## 2023-07-04 DIAGNOSIS — R350 Frequency of micturition: Secondary | ICD-10-CM | POA: Diagnosis not present

## 2023-07-09 DIAGNOSIS — Z0000 Encounter for general adult medical examination without abnormal findings: Secondary | ICD-10-CM | POA: Diagnosis not present

## 2023-07-09 DIAGNOSIS — Z1331 Encounter for screening for depression: Secondary | ICD-10-CM | POA: Diagnosis not present

## 2023-07-09 DIAGNOSIS — Z6828 Body mass index (BMI) 28.0-28.9, adult: Secondary | ICD-10-CM | POA: Diagnosis not present

## 2023-07-13 ENCOUNTER — Ambulatory Visit (INDEPENDENT_AMBULATORY_CARE_PROVIDER_SITE_OTHER): Payer: Medicare PPO | Admitting: Clinical

## 2023-07-13 DIAGNOSIS — F4322 Adjustment disorder with anxiety: Secondary | ICD-10-CM | POA: Diagnosis not present

## 2023-07-16 NOTE — Patient Instructions (Signed)
[facility] at [facility] [address] [place], [name] [address] [phone] (main office) [phone] (Aquan Kope's office)   

## 2023-07-17 DIAGNOSIS — K5903 Drug induced constipation: Secondary | ICD-10-CM | POA: Diagnosis not present

## 2023-07-17 DIAGNOSIS — R7303 Prediabetes: Secondary | ICD-10-CM | POA: Diagnosis not present

## 2023-07-17 DIAGNOSIS — Z8639 Personal history of other endocrine, nutritional and metabolic disease: Secondary | ICD-10-CM | POA: Diagnosis not present

## 2023-07-17 DIAGNOSIS — I10 Essential (primary) hypertension: Secondary | ICD-10-CM | POA: Diagnosis not present

## 2023-07-17 DIAGNOSIS — Z6827 Body mass index (BMI) 27.0-27.9, adult: Secondary | ICD-10-CM | POA: Diagnosis not present

## 2023-07-17 DIAGNOSIS — E663 Overweight: Secondary | ICD-10-CM | POA: Diagnosis not present

## 2023-07-23 ENCOUNTER — Ambulatory Visit (HOSPITAL_BASED_OUTPATIENT_CLINIC_OR_DEPARTMENT_OTHER)
Admission: RE | Admit: 2023-07-23 | Discharge: 2023-07-23 | Disposition: A | Discharge status: Home / Self Care | Payer: Medicare PPO | Source: Ambulatory Visit | Attending: Obstetrics & Gynecology | Admitting: Obstetrics & Gynecology

## 2023-07-23 ENCOUNTER — Encounter (HOSPITAL_BASED_OUTPATIENT_CLINIC_OR_DEPARTMENT_OTHER): Payer: Self-pay | Admitting: Radiology

## 2023-07-23 DIAGNOSIS — Z1231 Encounter for screening mammogram for malignant neoplasm of breast: Secondary | ICD-10-CM | POA: Diagnosis not present

## 2023-08-10 DIAGNOSIS — R3915 Urgency of urination: Secondary | ICD-10-CM | POA: Diagnosis not present

## 2023-08-10 DIAGNOSIS — R350 Frequency of micturition: Secondary | ICD-10-CM | POA: Diagnosis not present

## 2023-08-21 ENCOUNTER — Ambulatory Visit (INDEPENDENT_AMBULATORY_CARE_PROVIDER_SITE_OTHER): Payer: Medicare PPO | Admitting: Podiatry

## 2023-08-21 DIAGNOSIS — Z8639 Personal history of other endocrine, nutritional and metabolic disease: Secondary | ICD-10-CM | POA: Diagnosis not present

## 2023-08-21 DIAGNOSIS — Z6826 Body mass index (BMI) 26.0-26.9, adult: Secondary | ICD-10-CM | POA: Diagnosis not present

## 2023-08-21 DIAGNOSIS — E663 Overweight: Secondary | ICD-10-CM | POA: Diagnosis not present

## 2023-08-21 DIAGNOSIS — I10 Essential (primary) hypertension: Secondary | ICD-10-CM | POA: Diagnosis not present

## 2023-08-21 DIAGNOSIS — M545 Low back pain, unspecified: Secondary | ICD-10-CM | POA: Diagnosis not present

## 2023-08-21 DIAGNOSIS — G8929 Other chronic pain: Secondary | ICD-10-CM | POA: Diagnosis not present

## 2023-08-21 DIAGNOSIS — K5903 Drug induced constipation: Secondary | ICD-10-CM | POA: Diagnosis not present

## 2023-08-21 DIAGNOSIS — R7303 Prediabetes: Secondary | ICD-10-CM | POA: Diagnosis not present

## 2023-08-21 DIAGNOSIS — M5030 Other cervical disc degeneration, unspecified cervical region: Secondary | ICD-10-CM | POA: Diagnosis not present

## 2023-08-21 DIAGNOSIS — Z91199 Patient's noncompliance with other medical treatment and regimen due to unspecified reason: Secondary | ICD-10-CM

## 2023-08-21 NOTE — Progress Notes (Signed)
1. No-show for appointment     

## 2023-08-27 DIAGNOSIS — M810 Age-related osteoporosis without current pathological fracture: Secondary | ICD-10-CM | POA: Diagnosis not present

## 2023-08-31 ENCOUNTER — Ambulatory Visit (HOSPITAL_BASED_OUTPATIENT_CLINIC_OR_DEPARTMENT_OTHER): Payer: Medicare PPO

## 2023-08-31 VITALS — BP 134/74 | HR 76 | Ht 64.0 in | Wt 160.0 lb

## 2023-08-31 DIAGNOSIS — R300 Dysuria: Secondary | ICD-10-CM | POA: Diagnosis not present

## 2023-08-31 LAB — POCT URINALYSIS DIPSTICK
Bilirubin, UA: NEGATIVE
Blood, UA: NEGATIVE
Glucose, UA: NEGATIVE
Ketones, UA: NEGATIVE
Leukocytes, UA: NEGATIVE
Nitrite, UA: NEGATIVE
Protein, UA: NEGATIVE
Spec Grav, UA: 1.01 (ref 1.010–1.025)
Urobilinogen, UA: 0.2 U/dL
pH, UA: 6 (ref 5.0–8.0)

## 2023-08-31 NOTE — Progress Notes (Signed)
Pt presented to the office with symptoms for a possible uti. Pt stated she had dysuria and it started on yesterday with no other symptoms. Pt was educated on how to obtain urine sample. Urine sample was obtain and poc dipstick was performed. Urine culture will be sent out. Pt is aware urine culture will take a couple of days to come back and we will be in contact with her once it's back

## 2023-09-01 LAB — URINE CULTURE: Organism ID, Bacteria: NO GROWTH

## 2023-09-13 ENCOUNTER — Encounter (HOSPITAL_BASED_OUTPATIENT_CLINIC_OR_DEPARTMENT_OTHER): Payer: Self-pay | Admitting: Obstetrics & Gynecology

## 2023-09-13 ENCOUNTER — Ambulatory Visit (HOSPITAL_BASED_OUTPATIENT_CLINIC_OR_DEPARTMENT_OTHER): Payer: Medicare PPO | Admitting: Obstetrics & Gynecology

## 2023-09-13 VITALS — BP 140/82 | HR 57 | Ht 63.0 in | Wt 158.8 lb

## 2023-09-13 DIAGNOSIS — N8111 Cystocele, midline: Secondary | ICD-10-CM | POA: Diagnosis not present

## 2023-09-13 DIAGNOSIS — N390 Urinary tract infection, site not specified: Secondary | ICD-10-CM

## 2023-09-13 DIAGNOSIS — N3941 Urge incontinence: Secondary | ICD-10-CM | POA: Diagnosis not present

## 2023-09-13 DIAGNOSIS — N952 Postmenopausal atrophic vaginitis: Secondary | ICD-10-CM | POA: Diagnosis not present

## 2023-09-13 MED ORDER — ESTRADIOL 0.1 MG/GM VA CREA: TOPICAL_CREAM | VAGINAL | 1 refills | Status: DC

## 2023-09-13 NOTE — Patient Instructions (Signed)
[facility] at [facility] [address] [place], [place] [address] [phone]

## 2023-09-13 NOTE — Progress Notes (Signed)
 GYNECOLOGY  VISIT  CC:   recurrent UTIs  HPI: 73 y.o. G1P1 Married White or Caucasian female here for complaint of UTIs that were diagnosed on 9/19, 10/30 and 12/6.  She was seen at [facility] for this.  She saw different providers for these visits.  She did have urine cultures that showed e.coli.  Not sure if these fully cleared but did have a urine culture here in late December that was negative.  She does have some urinary incontinence and did see PT.  She has some limitations with the exercises she can do to prior hip surgery.  In fact, was advised to do certain exercises that ended up causing her to need a revision of her hip surgery.  So, does not want to go back to PT.  Still does kegels.    She needs to wear a pad and she doesn't leak every day.  She does not leak with laughing, coughing, sneezing.  She mostly will leak with feels urgency.     Past Medical History:  Diagnosis Date   Acid reflux    Anemia    during the time I was taking meloxicam   Arthritis    Asthma    LOV  6/13  Dr Neysa EPIC/ clearance on chart  from 10/12   Depression    Dysrhythmia    PVC's with caffeine and anxiety   Heart murmur    since childhood   Hiatal hernia    High cholesterol    Hypertension    OV with clearance and note Dr Aisha 6/13 chart   Osteoporosis    Sleep apnea    no CPAP/ last study 9 yrs ago- mild per patient   STD (sexually transmitted disease)    HSV2   Stye 06/2009   eye    MEDS:   Current Outpatient Medications on File Prior to Visit  Medication Sig Dispense Refill   albuterol  (VENTOLIN  HFA) 108 (90 Base) MCG/ACT inhaler Inhale 2 puffs every 4-6 hours as needed 18 g 11   amLODipine  (NORVASC ) 5 MG tablet Take 5 mg by mouth in the morning.     amoxicillin -clavulanate (AUGMENTIN ) 875-125 MG tablet Take 1 tablet by mouth 2 (two) times daily. 20 tablet 2   atorvastatin  (LIPITOR) 40 MG tablet Take 40 mg by mouth at bedtime.     azelastine  (ASTELIN ) 0.1 % nasal spray Place 1  spray into both nostrils in the morning. Use in each nostril as directed     B Complex-C (B-COMPLEX WITH VITAMIN C) tablet Take 1 tablet by mouth once a week.     Calcium  Carb-Cholecalciferol (CALCIUM  + VITAMIN D3 PO) Take 1 tablet by mouth at bedtime.     cetirizine (ZYRTEC) 10 MG tablet Take 10 mg by mouth daily as needed for allergies.      Cholecalciferol (VITAMIN D3 PO) Take 1 capsule by mouth in the morning.     cloNIDine  (CATAPRES ) 0.1 MG tablet Take 0.1 mg by mouth every evening.     cromolyn  (NASALCROM ) 5.2 MG/ACT nasal spray PLACE 1 SPRAY INTO BOTH NOSTRILS DAILY AS NEEDED FOR ALLERGIES. 26 mL 12   denosumab  (PROLIA ) 60 MG/ML SOSY injection Inject 60 mg into the skin every 6 (six) months.     docusate sodium  (COLACE) 100 MG capsule Take 100 mg by mouth at bedtime.     esomeprazole  (NEXIUM ) 40 MG capsule Take 40 mg by mouth daily before breakfast.     Eszopiclone  3 MG TABS Take 3  mg by mouth at bedtime. Take immediately before bedtime     fluticasone  (FLONASE ) 50 MCG/ACT nasal spray PLACE 2 SPRAYS INTO BOTH NOSTRILS AT BEDTIME. 48 mL 3   Fluticasone -Umeclidin-Vilant (TRELEGY ELLIPTA ) 100-62.5-25 MCG/ACT AEPB Inhale 1 puff then rinse mouth, once daily 60 each 11   hydroquinone  4 % cream Apply topically at bedtime. Apply to face x 3 months 28.35 g 2   Magnesium  500 MG TABS Take 500 mg by mouth at bedtime.     melatonin 3 MG TABS tablet Take 3 mg by mouth at bedtime as needed (sleep).     methocarbamol  (ROBAXIN ) 500 MG tablet Take 1 tablet (500 mg total) by mouth every 6 (six) hours as needed for muscle spasms. 40 tablet 0   montelukast  (SINGULAIR ) 10 MG tablet TAKE 1 TABLET BY MOUTH EVERY DAY 90 tablet 3   Prenatal Multivit-Min-Fe-FA (PRENATAL 1 + IRON  PO) Take 1 tablet by mouth in the morning.     Probiotic Product (PROBIOTIC PO) Take 1 capsule by mouth every evening.     valACYclovir  (VALTREX ) 500 MG tablet Take 1 tablet (500 mg total) by mouth daily. Increase to bid x 3 days with  outbreaks. 90 tablet 3   No current facility-administered medications on file prior to visit.    ALLERGIES: Meloxicam, Meperidine, Meperidine hcl, Nadolol, and Prevacid [lansoprazole]  SH:  married, non smoker  Review of Systems  Constitutional: Negative.   Genitourinary:  Positive for urgency.    PHYSICAL EXAMINATION:    BP (!) 140/82 (BP Location: Right Arm, Patient Position: Sitting, Cuff Size: Normal)   Pulse (!) 57   Ht 5' 3 (1.6 m)   Wt 158 lb 12.8 oz (72 kg)   LMP 09/05/2003   BMI 28.13 kg/m     General appearance: alert, cooperative and appears stated age Abdomen: soft, non-tender; bowel sounds normal; no masses,  no organomegaly Lymph:  no inguinal LAD noted  Pelvic: External genitalia:  no lesions              Urethra:  normal appearing urethra with no masses, tenderness or lesions              Bartholins and Skenes: normal                 Vagina:  atrophic tissue, no masses, 2nd degree cystocele              Cervix: no lesions              Bimanual Exam:  Uterus:  normal size, contour, position, consistency, mobility, non-tender              Adnexa: no mass, fullness, tenderness  Chaperone, Bascom Kotyk, CMA, was present for exam.  Assessment/Plan: 1. Urge incontinence of urine (Primary) - will start vaginal estrogen cream - has done PT and does not want to go back again - Ambulatory referral to Urogynecology  2. Recurrent UTI - estradiol  (ESTRACE ) 0.1 MG/GM vaginal cream; 1 gram vaginally twice weekly  Dispense: 42.5 g; Refill: 1  3. Vaginal atrophy  4. Cystocele, midline - do not feel she needs surgery

## 2023-09-21 DIAGNOSIS — M542 Cervicalgia: Secondary | ICD-10-CM | POA: Diagnosis not present

## 2023-09-21 DIAGNOSIS — M546 Pain in thoracic spine: Secondary | ICD-10-CM | POA: Diagnosis not present

## 2023-10-04 DIAGNOSIS — I10 Essential (primary) hypertension: Secondary | ICD-10-CM | POA: Diagnosis not present

## 2023-10-04 DIAGNOSIS — K5903 Drug induced constipation: Secondary | ICD-10-CM | POA: Diagnosis not present

## 2023-10-04 DIAGNOSIS — M5030 Other cervical disc degeneration, unspecified cervical region: Secondary | ICD-10-CM | POA: Diagnosis not present

## 2023-10-04 DIAGNOSIS — E663 Overweight: Secondary | ICD-10-CM | POA: Diagnosis not present

## 2023-10-04 DIAGNOSIS — Z6826 Body mass index (BMI) 26.0-26.9, adult: Secondary | ICD-10-CM | POA: Diagnosis not present

## 2023-10-04 DIAGNOSIS — Z8639 Personal history of other endocrine, nutritional and metabolic disease: Secondary | ICD-10-CM | POA: Diagnosis not present

## 2023-10-04 DIAGNOSIS — R7303 Prediabetes: Secondary | ICD-10-CM | POA: Diagnosis not present

## 2023-11-06 DIAGNOSIS — K5903 Drug induced constipation: Secondary | ICD-10-CM | POA: Diagnosis not present

## 2023-11-06 DIAGNOSIS — M5030 Other cervical disc degeneration, unspecified cervical region: Secondary | ICD-10-CM | POA: Diagnosis not present

## 2023-11-06 DIAGNOSIS — Z6826 Body mass index (BMI) 26.0-26.9, adult: Secondary | ICD-10-CM | POA: Diagnosis not present

## 2023-11-06 DIAGNOSIS — E663 Overweight: Secondary | ICD-10-CM | POA: Diagnosis not present

## 2023-11-06 DIAGNOSIS — I10 Essential (primary) hypertension: Secondary | ICD-10-CM | POA: Diagnosis not present

## 2023-11-06 DIAGNOSIS — Z8639 Personal history of other endocrine, nutritional and metabolic disease: Secondary | ICD-10-CM | POA: Diagnosis not present

## 2023-11-06 DIAGNOSIS — R7303 Prediabetes: Secondary | ICD-10-CM | POA: Diagnosis not present

## 2023-11-12 DIAGNOSIS — R7303 Prediabetes: Secondary | ICD-10-CM | POA: Diagnosis not present

## 2023-11-15 ENCOUNTER — Other Ambulatory Visit (INDEPENDENT_AMBULATORY_CARE_PROVIDER_SITE_OTHER): Payer: Self-pay | Admitting: Ophthalmology

## 2023-11-20 ENCOUNTER — Ambulatory Visit: Payer: Medicare PPO | Admitting: Podiatry

## 2023-11-20 ENCOUNTER — Encounter (HOSPITAL_BASED_OUTPATIENT_CLINIC_OR_DEPARTMENT_OTHER): Payer: Self-pay | Admitting: Emergency Medicine

## 2023-11-20 ENCOUNTER — Emergency Department (HOSPITAL_BASED_OUTPATIENT_CLINIC_OR_DEPARTMENT_OTHER)

## 2023-11-20 ENCOUNTER — Encounter: Payer: Self-pay | Admitting: Podiatry

## 2023-11-20 ENCOUNTER — Emergency Department (HOSPITAL_BASED_OUTPATIENT_CLINIC_OR_DEPARTMENT_OTHER): Admitting: Radiology

## 2023-11-20 VITALS — Ht 63.0 in | Wt 158.8 lb

## 2023-11-20 DIAGNOSIS — W101XXA Fall (on)(from) sidewalk curb, initial encounter: Secondary | ICD-10-CM | POA: Diagnosis not present

## 2023-11-20 DIAGNOSIS — M47812 Spondylosis without myelopathy or radiculopathy, cervical region: Secondary | ICD-10-CM | POA: Diagnosis not present

## 2023-11-20 DIAGNOSIS — S62317A Displaced fracture of base of fifth metacarpal bone. left hand, initial encounter for closed fracture: Secondary | ICD-10-CM | POA: Diagnosis not present

## 2023-11-20 DIAGNOSIS — I10 Essential (primary) hypertension: Secondary | ICD-10-CM | POA: Diagnosis not present

## 2023-11-20 DIAGNOSIS — S50312A Abrasion of left elbow, initial encounter: Secondary | ICD-10-CM | POA: Diagnosis not present

## 2023-11-20 DIAGNOSIS — R001 Bradycardia, unspecified: Secondary | ICD-10-CM | POA: Diagnosis not present

## 2023-11-20 DIAGNOSIS — S62352A Nondisplaced fracture of shaft of third metacarpal bone, right hand, initial encounter for closed fracture: Secondary | ICD-10-CM | POA: Insufficient documentation

## 2023-11-20 DIAGNOSIS — B351 Tinea unguium: Secondary | ICD-10-CM

## 2023-11-20 DIAGNOSIS — S80212A Abrasion, left knee, initial encounter: Secondary | ICD-10-CM | POA: Insufficient documentation

## 2023-11-20 DIAGNOSIS — G9389 Other specified disorders of brain: Secondary | ICD-10-CM | POA: Diagnosis not present

## 2023-11-20 DIAGNOSIS — S01511A Laceration without foreign body of lip, initial encounter: Secondary | ICD-10-CM | POA: Diagnosis not present

## 2023-11-20 DIAGNOSIS — S63512A Sprain of carpal joint of left wrist, initial encounter: Secondary | ICD-10-CM | POA: Diagnosis not present

## 2023-11-20 DIAGNOSIS — R519 Headache, unspecified: Secondary | ICD-10-CM | POA: Insufficient documentation

## 2023-11-20 DIAGNOSIS — S6992XA Unspecified injury of left wrist, hand and finger(s), initial encounter: Secondary | ICD-10-CM | POA: Diagnosis present

## 2023-11-20 DIAGNOSIS — M19032 Primary osteoarthritis, left wrist: Secondary | ICD-10-CM | POA: Diagnosis not present

## 2023-11-20 DIAGNOSIS — M4802 Spinal stenosis, cervical region: Secondary | ICD-10-CM | POA: Diagnosis not present

## 2023-11-20 DIAGNOSIS — S80211A Abrasion, right knee, initial encounter: Secondary | ICD-10-CM | POA: Insufficient documentation

## 2023-11-20 DIAGNOSIS — M79676 Pain in unspecified toe(s): Secondary | ICD-10-CM

## 2023-11-20 DIAGNOSIS — R918 Other nonspecific abnormal finding of lung field: Secondary | ICD-10-CM | POA: Diagnosis not present

## 2023-11-20 DIAGNOSIS — Z043 Encounter for examination and observation following other accident: Secondary | ICD-10-CM | POA: Diagnosis not present

## 2023-11-20 NOTE — ED Triage Notes (Signed)
 Fall- fall off curb Happened around 5 pm Pain in left wrist Facial injury, teeth into bottom lip.

## 2023-11-21 ENCOUNTER — Other Ambulatory Visit: Payer: Self-pay

## 2023-11-21 ENCOUNTER — Emergency Department (HOSPITAL_BASED_OUTPATIENT_CLINIC_OR_DEPARTMENT_OTHER)
Admission: EM | Admit: 2023-11-21 | Discharge: 2023-11-21 | Disposition: A | Discharge status: Home / Self Care | Attending: Emergency Medicine | Admitting: Emergency Medicine

## 2023-11-21 ENCOUNTER — Telehealth: Payer: Self-pay | Admitting: Internal Medicine

## 2023-11-21 ENCOUNTER — Emergency Department (HOSPITAL_BASED_OUTPATIENT_CLINIC_OR_DEPARTMENT_OTHER): Admitting: Radiology

## 2023-11-21 ENCOUNTER — Telehealth: Payer: Self-pay | Admitting: Orthopedic Surgery

## 2023-11-21 ENCOUNTER — Emergency Department (HOSPITAL_BASED_OUTPATIENT_CLINIC_OR_DEPARTMENT_OTHER)

## 2023-11-21 DIAGNOSIS — M25562 Pain in left knee: Secondary | ICD-10-CM | POA: Diagnosis not present

## 2023-11-21 DIAGNOSIS — Z96651 Presence of right artificial knee joint: Secondary | ICD-10-CM | POA: Diagnosis not present

## 2023-11-21 DIAGNOSIS — I878 Other specified disorders of veins: Secondary | ICD-10-CM | POA: Diagnosis not present

## 2023-11-21 DIAGNOSIS — Z96643 Presence of artificial hip joint, bilateral: Secondary | ICD-10-CM | POA: Diagnosis not present

## 2023-11-21 DIAGNOSIS — S62357A Nondisplaced fracture of shaft of fifth metacarpal bone, left hand, initial encounter for closed fracture: Secondary | ICD-10-CM

## 2023-11-21 DIAGNOSIS — W19XXXA Unspecified fall, initial encounter: Secondary | ICD-10-CM

## 2023-11-21 DIAGNOSIS — S6990XA Unspecified injury of unspecified wrist, hand and finger(s), initial encounter: Secondary | ICD-10-CM

## 2023-11-21 DIAGNOSIS — S8991XA Unspecified injury of right lower leg, initial encounter: Secondary | ICD-10-CM | POA: Diagnosis not present

## 2023-11-21 DIAGNOSIS — S01511A Laceration without foreign body of lip, initial encounter: Secondary | ICD-10-CM

## 2023-11-21 DIAGNOSIS — Z96652 Presence of left artificial knee joint: Secondary | ICD-10-CM | POA: Diagnosis not present

## 2023-11-21 DIAGNOSIS — S0993XA Unspecified injury of face, initial encounter: Secondary | ICD-10-CM | POA: Diagnosis not present

## 2023-11-21 DIAGNOSIS — S59902A Unspecified injury of left elbow, initial encounter: Secondary | ICD-10-CM | POA: Diagnosis not present

## 2023-11-21 DIAGNOSIS — M19041 Primary osteoarthritis, right hand: Secondary | ICD-10-CM | POA: Diagnosis not present

## 2023-11-21 DIAGNOSIS — S6991XA Unspecified injury of right wrist, hand and finger(s), initial encounter: Secondary | ICD-10-CM | POA: Diagnosis not present

## 2023-11-21 DIAGNOSIS — R220 Localized swelling, mass and lump, head: Secondary | ICD-10-CM | POA: Diagnosis not present

## 2023-11-21 DIAGNOSIS — M79641 Pain in right hand: Secondary | ICD-10-CM | POA: Diagnosis not present

## 2023-11-21 DIAGNOSIS — S3993XA Unspecified injury of pelvis, initial encounter: Secondary | ICD-10-CM | POA: Diagnosis not present

## 2023-11-21 DIAGNOSIS — Z471 Aftercare following joint replacement surgery: Secondary | ICD-10-CM | POA: Diagnosis not present

## 2023-11-21 DIAGNOSIS — M25531 Pain in right wrist: Secondary | ICD-10-CM | POA: Diagnosis not present

## 2023-11-21 DIAGNOSIS — R519 Headache, unspecified: Secondary | ICD-10-CM | POA: Diagnosis not present

## 2023-11-21 DIAGNOSIS — M25522 Pain in left elbow: Secondary | ICD-10-CM | POA: Diagnosis not present

## 2023-11-21 DIAGNOSIS — M25559 Pain in unspecified hip: Secondary | ICD-10-CM | POA: Diagnosis not present

## 2023-11-21 DIAGNOSIS — M25561 Pain in right knee: Secondary | ICD-10-CM | POA: Diagnosis not present

## 2023-11-21 MED ORDER — OXYCODONE HCL 5 MG PO TABS: 5.0000 mg | ORAL_TABLET | ORAL | 0 refills | Status: DC | PRN

## 2023-11-21 MED ORDER — HYDROCODONE-ACETAMINOPHEN 5-325 MG PO TABS
1.0000 | ORAL_TABLET | Freq: Once | ORAL | Status: AC
  Administered 2023-11-21: 1 via ORAL
  Filled 2023-11-21: qty 1

## 2023-11-21 MED ORDER — AMOXICILLIN-POT CLAVULANATE 875-125 MG PO TABS: 1.0000 | ORAL_TABLET | Freq: Two times a day (BID) | ORAL | 0 refills | Status: DC

## 2023-11-21 MED ORDER — ACETAMINOPHEN 500 MG PO TABS: 1000.0000 mg | ORAL_TABLET | Freq: Three times a day (TID) | ORAL | 0 refills | Status: DC

## 2023-11-21 NOTE — Telephone Encounter (Signed)
 Patient fell and had a CT Scan. They noticed that she had a lung nodule and was notified her to follow up with her pulmonologist. She would like to discuss the CT at her appointment on March 31st.

## 2023-11-21 NOTE — ED Provider Notes (Signed)
 [place] EMERGENCY DEPARTMENT AT [facility] Provider Note   CSN: [id number] Arrival date & time: 11/20/23  2116     History  Chief Complaint  Patient presents with   [name] is a 73 y.o. female.  Patient with a history of acid reflux, high cholesterol, hypertension, osteoporosis presents with fall.  States she was speed walking when she lost her balance near a curb and fell to the ground onto her outstretched hands and wrists.  Did hit her head and face and sustained a laceration to her bottom lip.  No loss of consciousness.  She believes she twisted her left ankle and then that is what made her fall but denies any ankle pain currently.  Complains of pain to her right lower lip, bilateral hands and wrist, left knee.  No blood thinner use.  Tetanus shot up-to-date.  Multiple abrasions involving her knees, hands and wrists and left elbow.  Denies any dizziness or syncope.  The history is provided by the patient.  Fall Associated symptoms include headaches. Pertinent negatives include no chest pain, no abdominal pain and no shortness of breath.       Home Medications Prior to Admission medications   Medication Sig Start Date End Date Taking? Authorizing Provider  albuterol (VENTOLIN HFA) 108 (90 Base) MCG/ACT inhaler Inhale 2 puffs every 4-6 hours as needed 06/04/23   Jetty Duhamel D, MD  amLODipine (NORVASC) 5 MG tablet Take 5 mg by mouth in the morning.    [provider]  amoxicillin-clavulanate (AUGMENTIN) 875-125 MG tablet Take 1 tablet by mouth 2 (two) times daily. 06/04/23   Jetty Duhamel D, MD  atorvastatin (LIPITOR) 40 MG tablet Take 40 mg by mouth at bedtime.    [provider]  azelastine (ASTELIN) 0.1 % nasal spray Place 1 spray into both nostrils in the morning. Use in each nostril as directed    [provider]  B Complex-C (B-COMPLEX WITH VITAMIN C) tablet Take 1 tablet by mouth once a week.    [provider]  Calcium Carb-Cholecalciferol (CALCIUM + VITAMIN D3 PO) Take 1 tablet by mouth at bedtime.    [provider]  cetirizine (ZYRTEC) 10 MG tablet Take 10 mg by mouth daily as needed for allergies.     [provider]  Cholecalciferol (VITAMIN D3 PO) Take 1 capsule by mouth in the morning.    [provider]  cloNIDine (CATAPRES) 0.1 MG tablet Take 0.1 mg by mouth every evening.    [provider]  cromolyn (NASALCROM) 5.2 MG/ACT nasal spray PLACE 1 SPRAY INTO BOTH NOSTRILS DAILY AS NEEDED FOR ALLERGIES. 01/08/23   Jetty Duhamel D, MD  denosumab (PROLIA) 60 MG/ML SOSY injection Inject 60 mg into the skin every 6 (six) months. 08/16/18   [provider]  docusate sodium (COLACE) 100 MG capsule Take 100 mg by mouth at bedtime.    [provider]  esomeprazole (NEXIUM) 40 MG capsule Take 40 mg by mouth daily before breakfast.    [provider]  estradiol (ESTRACE) 0.1 MG/GM vaginal cream 1 gram vaginally twice weekly 09/13/23   Jerene Bears, MD  Eszopiclone 3 MG TABS Take 3 mg by mouth at bedtime. Take immediately before bedtime    [provider]  fluticasone (FLONASE) 50 MCG/ACT nasal spray PLACE 2 SPRAYS INTO BOTH NOSTRILS AT BEDTIME. 04/04/23   Young, Joni Fears D, MD  Fluticasone-Umeclidin-Vilant (TRELEGY ELLIPTA) 100-62.5-25 MCG/ACT AEPB Inhale 1 puff  then rinse mouth, once daily 06/04/23   Jetty Duhamel D, MD  hydroquinone 4 % cream Apply topically at bedtime. Apply to face x 3 months 06/12/23   Terri Piedra, DO  Magnesium 500 MG TABS Take 500 mg by mouth at bedtime.    [provider]  melatonin 3 MG TABS tablet Take 3 mg by mouth at bedtime as needed (sleep).    [provider]  methocarbamol (ROBAXIN) 500 MG tablet Take 1 tablet (500 mg total) by mouth every 6 (six) hours as needed for muscle spasms. 06/08/22   Eartha Inch, PA  montelukast (SINGULAIR) 10 MG tablet TAKE 1 TABLET BY MOUTH EVERY DAY  06/04/23   Jetty Duhamel D, MD  Prenatal Multivit-Min-Fe-FA (PRENATAL 1 + IRON PO) Take 1 tablet by mouth in the morning.    [provider]  Probiotic Product (PROBIOTIC PO) Take 1 capsule by mouth every evening.    [provider]  valACYclovir (VALTREX) 500 MG tablet Take 1 tablet (500 mg total) by mouth daily. Increase to bid x 3 days with outbreaks. 02/08/23   Jerene Bears, MD      Allergies    Meloxicam, Meperidine, Meperidine hcl, Nadolol, and Prevacid [lansoprazole]    Review of Systems   Review of Systems  Constitutional:  Negative for activity change, appetite change and fever.  HENT:  Negative for congestion and rhinorrhea.   Respiratory:  Negative for cough, chest tightness and shortness of breath.   Cardiovascular:  Negative for chest pain.  Gastrointestinal:  Negative for abdominal pain, nausea and vomiting.  Genitourinary:  Negative for dysuria and hematuria.  Musculoskeletal:  Positive for arthralgias and myalgias.  Skin:  Positive for wound.  Neurological:  Positive for headaches. Negative for dizziness, weakness and light-headedness.   all other systems are negative except as noted in the HPI and PMH.    Physical Exam Updated Vital Signs BP (!) 162/93 (BP Location: Right Arm)   Pulse 61   Temp 98 F (36.7 C)   Resp 17   LMP 09/05/2003   SpO2 91%  Physical Exam Vitals and nursing note reviewed.  Constitutional:      General: She is not in acute distress.    Appearance: She is well-developed.  HENT:     Head: Normocephalic and atraumatic.     Comments: No trismus or malocclusion.  1 cm mucosal laceration to right lower lip, not through and through.  No loose teeth.    Mouth/Throat:     Pharynx: No oropharyngeal exudate.  Eyes:     Conjunctiva/sclera: Conjunctivae normal.     Pupils: Pupils are equal, round, and reactive to light.  Neck:     Comments: No midline C-spine tenderness Cardiovascular:     Rate and Rhythm: Normal rate and  regular rhythm.     Heart sounds: Normal heart sounds. No murmur heard. Pulmonary:     Effort: Pulmonary effort is normal. No respiratory distress.     Breath sounds: Normal breath sounds.  Abdominal:     Palpations: Abdomen is soft.     Tenderness: There is no abdominal tenderness. There is no guarding or rebound.  Musculoskeletal:        General: Tenderness and signs of injury present. Normal range of motion.     Cervical back: Normal range of motion and neck supple.     Comments: Abrasion left elbow, left dorsal hand.  Abrasions bilateral knees.  Tenderness to palpation left lateral hand.  No  pain to palpation to left wrist.  Full range of motion of cardinal hand movements bilaterally.  Skin:    General: Skin is warm.  Neurological:     Mental Status: She is alert and oriented to person, place, and time.     Cranial Nerves: No cranial nerve deficit.     Motor: No abnormal muscle tone.     Coordination: Coordination normal.     Comments:  5/5 strength throughout. CN 2-12 intact.Equal grip strength.   Psychiatric:        Behavior: Behavior normal.     ED Results / Procedures / Treatments   Labs (all labs ordered are listed, but only abnormal results are displayed) Labs Reviewed - No data to display  EKG None  Radiology DG Wrist Complete Right Result Date: 11/21/2023 CLINICAL DATA:  73 year old female status post fall from curb at 1700 hours. Facial injury. Pain. EXAM: RIGHT WRIST - COMPLETE 3+ VIEW COMPARISON:  Right hand series today. FINDINGS: Four views at 0500 hours. Bone mineralization is within normal limits for age. Distal radius and ulna appear intact and aligned. Abnormal scapholunate widening, pronounced (6-7 mm). Otherwise maintained carpal bone alignment. No acute carpal bone fracture is identified. Metacarpals appear intact. Degeneration most pronounced at the right [facility]. IMPRESSION: 1. Positive for scapholunate ligamentous injury, although age indeterminate, which  can predispose to [facility] wrist. 2. No acute fracture identified about the right wrist. Electronically Signed   By: Odessa Fleming M.D.   On: 11/21/2023 05:56   DG Pelvis 1-2 Views Result Date: 11/21/2023 CLINICAL DATA:  73 year old female status post fall from curb at 1700 hours. Facial injury. Pain. EXAM: PELVIS - 1-2 VIEW COMPARISON:  Pelvis radiograph 06/07/2022 and earlier. FINDINGS: Chronic bilateral total hip arthroplasty. AP and frogleg lateral views at 0454 hours. The femoral components are incompletely imaged, but the visible hardware appears stable, aligned. Pelvis appears stable and intact. Symmetric SI joints. Negative visible bowel gas. Incidental pelvic phleboliths. IMPRESSION: 1. No acute fracture identified about the pelvis. 2. Chronic bilateral total hip arthroplasty, visible hardware appears stable and aligned. Electronically Signed   By: Odessa Fleming M.D.   On: 11/21/2023 05:52   DG Knee 2 Views Right Result Date: 11/21/2023 CLINICAL DATA:  73 year old female status post fall from curb at 1700 hours. Facial injury. Pain. EXAM: RIGHT KNEE - 1-2 VIEW COMPARISON:  Right knee series 12/25/2011. FINDINGS: Interval right total knee arthroplasty. Hardware appears intact and aligned on these two views at 0450 hours. The lateral view is somewhat oblique. No joint effusion. Postoperative changes to the patella which otherwise appear intact. No acute osseous abnormality identified. No discrete soft tissue injury. IMPRESSION: Right total knee arthroplasty with no acute osseous abnormality identified. Electronically Signed   By: Odessa Fleming M.D.   On: 11/21/2023 05:49   DG Hand Complete Right Result Date: 11/21/2023 CLINICAL DATA:  73 year old female status post fall from curb at 1700 hours. Facial injury. Pain. EXAM: RIGHT HAND - COMPLETE 3+ VIEW COMPARISON:  None Available. FINDINGS: Three views at 0503 hours. Bone mineralization is within normal limits for age. Widening of the scapholunate interval, see dedicated  right wrist series today reported separately. No superimposed carpal fracture is identified. Degenerative changes at the carpometacarpal joints, most pronounced at the [facility]. Metacarpals appear intact. Phalanges appear intact and aligned with normal joint spaces for age. No discrete soft tissue injury. IMPRESSION: 1. Widening of the scapholunate interval, see dedicated Wrist series reported separately. 2. No acute fracture or  dislocation identified about the Right hand. Electronically Signed   By: Odessa Fleming M.D.   On: 11/21/2023 05:48   DG Elbow Complete Left Result Date: 11/21/2023 CLINICAL DATA:  73 year old female status post fall from curb at 1700 hours. Facial injury. Pain. EXAM: LEFT ELBOW - COMPLETE 3+ VIEW COMPARISON:  None Available. FINDINGS: Four views at 0456 hours. Bone mineralization is within normal limits for age. Maintained joint spaces and alignment at the left elbow. Incidental distal humerus enthesophyte ptosis. No evidence of joint effusion. No fracture or dislocation identified. IMPRESSION: No acute fracture or dislocation identified about the left elbow. Electronically Signed   By: Odessa Fleming M.D.   On: 11/21/2023 05:45   DG Ankle Complete Left Result Date: 11/21/2023 CLINICAL DATA:  73 year old female status post fall from curb at 1700 hours. Facial injury, pain. EXAM: LEFT ANKLE COMPLETE - 3+ VIEW COMPARISON:  Left foot series 07/26/2021. FINDINGS: Three views. Maintained mortise joint alignment. No evidence of joint effusion. Degenerative changes along the medial dome of the talus, medial malleolus with small subchondral cysts and sclerosis. Talar dome otherwise intact. Distal tibia, fibula, and calcaneus appear intact. Mild for age chronic calcaneus degenerative spurring. No acute osseous abnormality identified. IMPRESSION: No acute fracture or dislocation identified about the left ankle. Electronically Signed   By: Odessa Fleming M.D.   On: 11/21/2023 05:39   CT Maxillofacial Wo  Contrast Result Date: 11/21/2023 CLINICAL DATA:  73 year old female status post fall from curb at 1700 hours. Facial injury, pain. EXAM: CT MAXILLOFACIAL WITHOUT CONTRAST TECHNIQUE: Multidetector CT imaging of the maxillofacial structures was performed. Multiplanar CT image reconstructions were also generated. RADIATION DOSE REDUCTION: This exam was performed according to the departmental dose-optimization program which includes automated exposure control, adjustment of the mA and/or kV according to patient size and/or use of iterative reconstruction technique. COMPARISON:  Head and cervical spine CT last night. FINDINGS: Osseous: Mandible appears intact and normally located. Right greater than left mandibular torus, normal variant. No acute mandible dental finding identified. No acute maxillary dental finding identified. Mild mostly right side maxillary torus. Bilateral maxilla, zygoma, pterygoid, and nasal bones appear intact. Central skull base osteopenia. Stable skull base and visible advanced upper cervical spine degeneration. Orbits: No orbital wall fracture. Postoperative changes to both globes. Bilateral orbits soft tissues otherwise normal. Sinuses: Clear throughout. Soft tissues: Mild asymmetric lower lip soft tissue swelling greater on the right side. Negative visible noncontrast larynx, pharynx, parapharyngeal spaces, retropharyngeal space, sublingual space, submandibular spaces, masticator and parotid spaces. Limited intracranial: Stable to that reported last night. IMPRESSION: Asymmetric lower lip soft tissue swelling. No acute Face or dental fracture identified. Electronically Signed   By: Odessa Fleming M.D.   On: 11/21/2023 05:38   DG Knee 2 Views Left Result Date: 11/21/2023 CLINICAL DATA:  Fall.  Pain. EXAM: LEFT KNEE - 1-2 VIEW COMPARISON:  None Available. FINDINGS: Status post tricompartmental knee replacement. No evidence for an acute fracture or hardware complication. No joint effusion.  IMPRESSION: Negative. Electronically Signed   By: Kennith Center M.D.   On: 11/21/2023 05:36   DG Wrist Complete Left Result Date: 11/20/2023 CLINICAL DATA:  Status post fall. EXAM: LEFT WRIST - COMPLETE 3+ VIEW COMPARISON:  None Available. FINDINGS: There is an acute, nondisplaced fracture deformity involving the proximal portion of the fifth left metacarpal. There is no evidence of dislocation. Extensive degenerative changes are seen throughout the left wrist. Soft tissues are unremarkable. IMPRESSION: Acute fracture of the fifth left metacarpal. Electronically  Signed   By: Aram Candela M.D.   On: 11/20/2023 23:39   CT Cervical Spine Wo Contrast Result Date: 11/20/2023 CLINICAL DATA:  Status post fall. EXAM: CT CERVICAL SPINE WITHOUT CONTRAST TECHNIQUE: Multidetector CT imaging of the cervical spine was performed without intravenous contrast. Multiplanar CT image reconstructions were also generated. RADIATION DOSE REDUCTION: This exam was performed according to the departmental dose-optimization program which includes automated exposure control, adjustment of the mA and/or kV according to patient size and/or use of iterative reconstruction technique. COMPARISON:  None Available. FINDINGS: Alignment: Normal. Skull base and vertebrae: No acute fracture. No primary bone lesion or focal pathologic process. Soft tissues and spinal canal: No prevertebral fluid or swelling. No visible canal hematoma. Disc levels: Moderate severity multilevel endplate sclerosis is seen throughout all levels of the cervical spine. Mild moderate severity anterior osteophyte formation and posterior bony spurring are also seen at the levels of C3-C4, C4-C5, C5-C6, C6-C7 and C7-T1. Moderate severity multilevel intervertebral disc space narrowing is seen throughout the cervical spine. This is slightly more prominent at the level of C2-C3. Bilateral marked severity multilevel facet joint hypertrophy is noted. Upper chest: A 6 mm  partially imaged irregular lung nodule versus focal scarring is seen within the anterior aspect of the right apex (axial CT image 89, CT series 4). Other: Multiple bilateral subcentimeter posterior cervical chain lymph nodes are seen. IMPRESSION: 1. No acute fracture or subluxation within the cervical spine. 2. Moderate severity multilevel degenerative changes throughout the cervical spine. 3. 6 mm partially imaged irregular lung nodule versus focal scarring within the anterior aspect of the right apex. Further evaluation with nonemergent chest CT is recommended. Electronically Signed   By: Aram Candela M.D.   On: 11/20/2023 23:23   CT Head Wo Contrast Result Date: 11/20/2023 CLINICAL DATA:  Status post fall. EXAM: CT HEAD WITHOUT CONTRAST TECHNIQUE: Contiguous axial images were obtained from the base of the skull through the vertex without intravenous contrast. RADIATION DOSE REDUCTION: This exam was performed according to the departmental dose-optimization program which includes automated exposure control, adjustment of the mA and/or kV according to patient size and/or use of iterative reconstruction technique. COMPARISON:  December 25, 2011 FINDINGS: Brain: There is mild cerebral atrophy with widening of the extra-axial spaces and ventricular dilatation. There are areas of decreased attenuation within the white matter tracts of the supratentorial brain, consistent with microvascular disease changes. Vascular: No hyperdense vessel or unexpected calcification. Skull: Normal. Negative for fracture or focal lesion. Sinuses/Orbits: No acute finding. Other: None. IMPRESSION: No acute intracranial abnormality. Electronically Signed   By: Aram Candela M.D.   On: 11/20/2023 23:19    Procedures Procedures    Medications Ordered in ED Medications  HYDROcodone-acetaminophen (NORCO/VICODIN) 5-325 MG per tablet 1 tablet (has no administration in time range)    ED Course/ Medical Decision Making/ A&P                                  Medical Decision Making Amount and/or Complexity of Data Reviewed Labs: ordered. Decision-making details documented in ED Course. Radiology: ordered and independent interpretation performed. Decision-making details documented in ED Course. ECG/medicine tests: ordered and independent interpretation performed. Decision-making details documented in ED Course.  Risk Prescription drug management.   Mechanical fall with multiple injuries.  Vital stable.  GCS is 15, ABCs are intact.  No blood thinner use.  CT head and C-spine are negative from  triage.  Results reviewed and interpreted by me.  X-ray remarkable for left fifth metacarpal fracture  CT face is negative for acute fracture.  Remainder of x-rays are negative except for scapholunate widening on the right side  Bilateral knees, hips and elbow x-rays are negative.  Patient able to ambulate and tolerate p.o.  Xray findings d/w Dr. Denese Killings hand surgery.  Agrees with ulnar gutter splint on the left side for fifth metacarpal fracture.  Scapholunate widening on the right side may or may not be chronic.  Recommends removable wrist splint on this side.  He was seen in clinic follow-up.  Patient tolerating p.o. and ambulatory.  Denies dizziness or lightheadedness.  No chest pain or shortness of breath.  Follow-up with PCP as well as hand surgery.  Will give prophylactic antibiotics for her mucosal lip laceration.  On discharge patient noted be bradycardic in the mid 40s.  Will obtain EKG.  She denies any dizziness or lightheadedness or syncope causing her fall.  Anticipate discharge home if EKG is negative for high degree AV block. Dr. Posey Rea to assume care.         Final Clinical Impression(s) / ED Diagnoses Final diagnoses:  Fall, initial encounter  Nondisplaced fracture of shaft of fifth metacarpal bone, left hand, initial encounter for closed fracture  Lip laceration, initial encounter  Scapholunate  ligament injury with no instability, initial encounter    Rx / DC Orders ED Discharge Orders     None         Earsie Humm, Jeannett Senior, MD 11/21/23 [phone]

## 2023-11-21 NOTE — Telephone Encounter (Signed)
 Spoke with patient. She is scheduled for Tuesday 25th

## 2023-11-21 NOTE — ED Notes (Signed)
 DC instructions and scripts reviewed with pt. Pt to follow up with ortho this week. No questions or concerns at this time.

## 2023-11-21 NOTE — Telephone Encounter (Signed)
 Pt was seen at [facility] for fall and wrist pains. Pt staten they said she need to be seen today or tomorrow. Pt phone is  [phone]

## 2023-11-21 NOTE — Telephone Encounter (Signed)
 I see that she fell and was hurt. Im really sorry. We can discuss at next visit unless she needs me to do something sooner.

## 2023-11-21 NOTE — Telephone Encounter (Addendum)
 Called patient.  Patient wanted Dr. Maple Hudson to be aware of the CT scan.  Notified patient I would forward information regarding scans and radiology imaging to Dr. Maple Hudson and he will address the results at patients next OV on 12/03/2023.  Dr. Maple Hudson, just [facility].

## 2023-11-21 NOTE — ED Provider Notes (Signed)
  Physical Exam  BP (!) 145/81 (BP Location: Right Arm)   Pulse (!) 48   Temp 98.4 F (36.9 C) (Oral)   Resp 16   LMP 09/05/2003   SpO2 94%   Physical Exam Vitals and nursing note reviewed.  Constitutional:      General: She is not in acute distress.    Appearance: She is well-developed.  HENT:     Head: Normocephalic and atraumatic.  Eyes:     Conjunctiva/sclera: Conjunctivae normal.  Pulmonary:     Effort: Pulmonary effort is normal. No respiratory distress.  Musculoskeletal:        General: No swelling.     Cervical back: Neck supple.  Skin:    General: Skin is warm and dry.     Capillary Refill: Capillary refill takes less than 2 seconds.  Neurological:     Mental Status: She is alert.  Psychiatric:        Mood and Affect: Mood normal.     Procedures  Procedures  ED Course / MDM    Medical Decision Making Amount and/or Complexity of Data Reviewed Radiology: ordered. ECG/medicine tests: ordered.  Risk OTC drugs. Prescription drug management.   Patient received in handoff.  Follow-up pending ECG.  ECG with sinus bradycardia.  Patient does not meet inpatient criteria for admission and will be discharged with outpatient follow-up.  Pain medicine ordered and sent to pharmacy.  Will follow-up outpatient for her hand fracture       Glendora Score, MD 11/21/23 [phone]

## 2023-11-21 NOTE — ED Notes (Signed)
Pt ambulatory to room.

## 2023-11-21 NOTE — Discharge Instructions (Addendum)
 Testing shows a fracture of the fifth metacarpal of your left hand.  There is also widening of the scapholunate joint of your right wrist which may be chronic.  CT head, face and neck are negative for acute traumatic injury. CT of the neck did show a possible lung nodule in your chest and your doctor should schedule you for a CT of your chest for further evaluation of this.   Take the antibiotics and pain medication as prescribed.  Follow-up with a hand surgeon for further evaluation of your wrist and hand injuries.  Return to the ED with new or worsening symptoms.

## 2023-11-22 DIAGNOSIS — M810 Age-related osteoporosis without current pathological fracture: Secondary | ICD-10-CM | POA: Diagnosis not present

## 2023-11-22 DIAGNOSIS — R911 Solitary pulmonary nodule: Secondary | ICD-10-CM | POA: Diagnosis not present

## 2023-11-22 DIAGNOSIS — S62647D Nondisplaced fracture of proximal phalanx of left little finger, subsequent encounter for fracture with routine healing: Secondary | ICD-10-CM | POA: Diagnosis not present

## 2023-11-22 DIAGNOSIS — R7303 Prediabetes: Secondary | ICD-10-CM | POA: Diagnosis not present

## 2023-11-22 DIAGNOSIS — Z23 Encounter for immunization: Secondary | ICD-10-CM | POA: Diagnosis not present

## 2023-11-22 DIAGNOSIS — I10 Essential (primary) hypertension: Secondary | ICD-10-CM | POA: Diagnosis not present

## 2023-11-22 DIAGNOSIS — G8929 Other chronic pain: Secondary | ICD-10-CM | POA: Diagnosis not present

## 2023-11-22 DIAGNOSIS — E782 Mixed hyperlipidemia: Secondary | ICD-10-CM | POA: Diagnosis not present

## 2023-11-22 DIAGNOSIS — M545 Low back pain, unspecified: Secondary | ICD-10-CM | POA: Diagnosis not present

## 2023-11-25 NOTE — Progress Notes (Signed)
  Subjective:  Patient ID: [name], female    DOB: [birth year],  MRN: [id number]  73 y.o. female presents painful, elongated thickened toenails x 10 which are symptomatic when wearing enclosed shoe gear. This interferes with his/her daily activities. Chief Complaint  Patient presents with   Nail Problem    Pt is here for [facility] PCP is Dr Uvaldo Rising and LOV was in September.   New problem(s): None   PCP is Gweneth Dimitri, MD.  Allergies  Allergen Reactions   Meloxicam Other (See Comments)    Blood count went down    Meperidine Nausea And Vomiting and Other (See Comments)   Meperidine Hcl Other (See Comments)   Nadolol Other (See Comments)    Low blood pressure   Prevacid [Lansoprazole] Other (See Comments)    Unknown reaction (patient does not recall)    Review of Systems: Negative except as noted in the HPI.   Objective:  [name] is a pleasant 73 y.o. female WD, WN in NAD. AAO x 3.  Vascular Examination: Vascular status intact b/l with palpable pedal pulses. CFT immediate b/l. Pedal hair present. No edema. No pain with calf compression b/l. Skin temperature gradient WNL b/l. No varicosities noted. No cyanosis or clubbing noted.  Neurological Examination: Sensation grossly intact b/l with 10 gram monofilament. Vibratory sensation intact b/l.  Dermatological Examination: Pedal skin with normal turgor, texture and tone b/l. No open wounds nor interdigital macerations noted. Toenails 1-5 b/l thick, discolored, elongated with subungual debris and pain on dorsal palpation. No hyperkeratotic lesions noted b/l.   Musculoskeletal Examination: Muscle strength 5/5 to b/l LE.  No pain, crepitus noted b/l. HAV with bunion deformity noted b/l LE.  Radiographs: None  Last A1c:       No data to display           Assessment:   1. Pain due to onychomycosis of toenail    Plan:  Patient was evaluated and treated. All patient's and/or POA's questions/concerns addressed on  today's visit. Mycotic toenails 1-5 debrided in length and girth without incident. Continue soft, supportive shoe gear daily. Report any pedal injuries to medical professional. Call office if there are any quesitons/concerns. -Dispensed toe separators for HAV with bunion b/l. Apply to 1st webspace bilaterally every morning. Remove every evening. -Patient/POA to call should there be question/concern in the interim.  Return in about 3 months (around 02/20/2024).  [name], DPM      [place] LOCATION: [address], [place] [address]                   Office [phone]   [facility] LOCATION: [address] [place], [place] [address] Office [phone]

## 2023-11-27 ENCOUNTER — Ambulatory Visit: Admitting: Orthopedic Surgery

## 2023-11-27 DIAGNOSIS — M19131 Post-traumatic osteoarthritis, right wrist: Secondary | ICD-10-CM

## 2023-11-27 DIAGNOSIS — M79642 Pain in left hand: Secondary | ICD-10-CM | POA: Diagnosis not present

## 2023-11-27 DIAGNOSIS — S62357A Nondisplaced fracture of shaft of fifth metacarpal bone, left hand, initial encounter for closed fracture: Secondary | ICD-10-CM

## 2023-11-28 NOTE — Progress Notes (Signed)
 KYNLEIGH ARTZ - 73 y.o. female MRN [id number]  Date of birth: 1950/11/02  Office Visit Note: Visit Date: 11/27/2023 PCP: Gweneth Dimitri, MD Referred by: Gweneth Dimitri, MD  Subjective: No chief complaint on file.  HPI: LYBERTI THRUSH is a pleasant 73 y.o. female who presents today for evaluation of left small finger metacarpal fracture that was sustained approximately 1 week prior as part of a mechanical fall.  She also did have some trauma to the right wrist as well, x-rays demonstrated widening of the scapholunate interval which is likely chronic in nature.  She was seen in the emergency department setting the day of her injury, was placed into a ulnar gutter splint for the left hand.  Her pain is relatively well-controlled currently and is improving in the left hand, no skin ongoing pain at the right wrist.  Pertinent ROS were reviewed with the patient and found to be negative unless otherwise specified above in HPI.    Assessment & Plan: Visit Diagnoses:  1. Nondisplaced fracture of shaft of fifth metacarpal bone, left hand, initial encounter for closed fracture   2. Scapholunate advanced collapse of right wrist     Plan: Extensive discussion was had with the patient today regarding her bilateral hand complaints.  As for the left hand, I reviewed the results of her prior x-rays which do show an acute fracture of the small finger metacarpal shaft without significant displacement or dislocation.  She is responding well to the immobilization in the ulnar gutter splint, we can transition today to a removable splint to allow for gentle range of motion and hygiene.  This was fitted today without incident.  As for the right wrist, I discussed with her the underlying nature of the SLAC arthritis and scapholunate widening in this region.  This is chronic in nature, and she does mention she has been seen in the past for this with previous injections with moderate relief.  Currently, she has  minimal pain at this region without any evidence of significant instability on examination today.  We can continue with observation at the time for the right wrist.  I did mention to her that there will be surgical interventions available to her in the future should this progress and become more painful, this would be addressed at a later date.  For the time being we will focus on the left hand metacarpal shaft fracture of the small finger.  As mentioned, ulnar gutter splint was given today, removable variety.  She can do gentle range of motion exercises, refrain from strengthening at this time.  Return in approximately 3 weeks for repeat clinical and radiographic check.  She expressed full understanding.  I spent 45 minutes in the care of this patient today including review of previous documentation, imaging obtained, face-to-face time discussing all options regarding treatment and documenting the encounter.    Follow-up: Return in about 1 month (around 12/28/2023).   Meds & Orders: No orders of the defined types were placed in this encounter.  No orders of the defined types were placed in this encounter.    Procedures: No procedures performed      Clinical History: No specialty comments available.  She reports that she has never smoked. She has never used smokeless tobacco. No results for input(s): "HGBA1C", "LABURIC" in the last 8760 hours.  Objective:   Vital Signs: LMP 09/05/2003   Physical Exam  Gen: Well-appearing, in no acute distress; non-toxic CV: Regular Rate. Well-perfused. Warm.  Resp: Breathing  unlabored on room air; no wheezing. Psych: Fluid speech in conversation; appropriate affect; normal thought process  Ortho Exam   Imaging: Prior imaging of bilateral wrists and right hand were reviewed in detail  Past Medical/Family/Surgical/Social History: Medications & Allergies reviewed per EMR, new medications updated. 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   Age-related osteoporosis without current pathological fracture 12/02/2020   Deviated septum 10/04/2016   Nasal turbinate hypertrophy 10/04/2016   Itchy eyes 09/13/2016   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 smear of cervix 01/24/2016   Acute bronchitis 11/25/2015   Postop Hyponatremia 03/20/2012   OA (osteoarthritis) of knee 03/18/2012   Sinusitis, chronic 11/25/2008   Obstructive sleep apnea 06/14/2007   HYPERTENSION 06/14/2007   Seasonal and perennial allergic rhinitis 06/14/2007   Asthma, moderate persistent 06/14/2007   GERD 06/14/2007   Past Medical History:  Diagnosis Date   Acid reflux    Anemia    "during the time I was taking meloxicam"   Arthritis    Asthma    LOV  6/13  Dr Maple Hudson EPIC/ clearance on chart  from 10/12   Depression    Dysrhythmia    PVC's with caffeine and anxiety   Heart murmur    since childhood   Hiatal hernia    High cholesterol    Hypertension    OV with clearance and note Dr Corliss Blacker 6/13 chart   Osteoporosis    Sleep apnea    no CPAP/ last study 9 yrs ago- "mild per patient"   STD (sexually transmitted disease)    HSV2   Stye 06/2009   eye   Family History  Problem Relation Age of Onset   Heart disease Father    Cancer Father    Hypertension Father    Kidney Stones Father    Arthritis Mother    Hypertension Mother    Pancreatic cancer Other    Coronary artery disease Other    Cancer Other    Asthma Other    Nephritis Daughter        glomerular   Past Surgical History:  Procedure Laterality Date   ACETABULAR REVISION Right 06/07/2022   Procedure: Right hip constrained liner versus dual mobility cup;  Surgeon: Ollen Gross, MD;  Location: [facility];  Service: Orthopedics;  Laterality: Right;   bilateral hip  replacement Bilateral 6/07, 9/07   right, then left   BREAST REDUCTION SURGERY     CESAREAN SECTION     FRACTURE SURGERY     Left leg-1977   NASAL SEPTOPLASTY W/ TURBINOPLASTY Bilateral 07/19/2018   Procedure: NASAL SEPTOPLASTY WITH TURBINATE REDUCTION;  Surgeon: Osborn Coho, MD;  Location: [facility] OR;  Service: ENT;  Laterality: Bilateral;   NASAL SEPTUM SURGERY     REDUCTION MAMMAPLASTY     TOTAL KNEE ARTHROPLASTY  03/18/2012   Procedure: TOTAL KNEE ARTHROPLASTY;  Surgeon: Loanne Drilling, MD;  Location: [facility];  Service: Orthopedics;  Laterality: Left;   TOTAL KNEE ARTHROPLASTY Right 07/23/2017   Procedure: RIGHT TOTAL KNEE ARTHROPLASTY;  Surgeon: Ollen Gross, MD;  Location: [facility];  Service: Orthopedics;  Laterality: Right;   Social History   Occupational History   Occupation: RETIRED    Employer: RETIRED  Tobacco Use   Smoking status: Never   Smokeless tobacco: Never  Vaping Use   Vaping status: Never Used  Substance and Sexual Activity   Alcohol use: Yes    Alcohol/week: 0.0 - 1.0 standard drinks of alcohol   Drug use: No   Sexual activity: Not Currently    Partners: Male    Damean Poffenberger Fara Boros) Denese Killings, M.D. [facility], Hand Surgery

## 2023-12-01 NOTE — Progress Notes (Unsigned)
 Patient ID: [name], female    DOB: 07-Aug-1951, 73 y.o.   MRN: [id number]  HPI  F never smoker folowed for asthma, allergic rhinitis, complicated by hx OSA/ oral appliance, HBP, GERD PFT 05/30/10- FEV1 2.56/ 91%, FEV1/FVC 0.73, DLCO 84%  within normal limits. NPSG-04/21/14- Moderate obstructive sleep apnea, AHI 21/ hr, CPAP to 14, weight 185 lbs  -----------------------------------------------------------------   06/04/23-  73 year old female never smoker followed for Asthma, Allergic Rhinitis, OSA-failedCPAP/ oral appliance, complicated by HBP, GERD, OA,  -Singulair, Trelegy100, albuterol HFA, Lunesta Body weight today 168 lbs Had flu vax at PCP   Had RSV vax last year. Doing well. Meds are sufficient. No changes or concerns.  12/03/23- 73 year old female never smoker followed for Asthma, Allergic Rhinitis, OSA-failedCPAP/ oral appliance, complicated by HBP, GERD, OA,  -Singulair, Trelegy100, albuterol HFA, Lunesta Body weight today  [place] 10 Discussed the use of AI scribe software for clinical note transcription with the patient, who gave verbal consent to proceed.  History of Present Illness   The patient, with a history of asthma, presents with seasonal allergies causing a runny nose and sneezing. She reports that her asthma is not severe. She recently fell and broke a finger in her hand.. She is currently using a Trelegy 100 inhaler for maintenance and Singulair, but rarely requires a rescue inhaler. She also keeps a 10-day course of Augmentin on hand for sinus infections, which have decreased in frequency since the start of the COVID-19 pandemic.  The patient also reports difficulty sleeping and has a remote history of sleep apnea. She has tried using a CPAP machine in the past but found it unhelpful. She currently takes Lunesta for insomnia, which helps her fall asleep, but she still does not feel well-rested. She also takes an unspecified blood pressure medication at night  to aid sleep. She expresses interest in exploring other options for managing her sleep apnea and insomnia. We agreed to update with a home sleep test, then consider the insomnia complaint.     Review of Systems- see HPI  + = positive Constitutional:   +  weight loss- see HPI, night sweats, fevers, chills, +fatigue, lassitude. HEENT:   +  headaches, difficulty swallowing, tooth/dental problems, sore throat,       No-  sneezing, itching, ear ache, +nasal congestion, post nasal drip,  CV:  No-   chest pain, orthopnea, PND, swelling in lower extremities, anasarca,  dizziness, palpitations Resp: No-   shortness of breath with exertion or at rest.              No-   productive cough, +non-productive cough,  No- coughing up of blood.              No-   change in color of mucus. +wheezing.   Skin: No-   rash or lesions. GI:  +heartburn, indigestion, abdominal pain, nausea, vomiting, GU: . MS:  See HPI- + joint pain or swelling.   Neuro-     nothing unusual Psych:  No- change in mood or affect. No depression or anxiety.  No memory loss.  OBJ General- Alert, Oriented, Affect-appropriate, Distress- none acute.  + Overweight Skin- rash-none, lesions- none, excoriation- none Lymphadenopathy- none Head- atraumatic            Eyes- Gross vision intact, PERRLA, conjunctivae clear            Ears- Hearing, canals-normal            Nose- mucus,  no-polyps, erosion, perforation             Throat- Mallampati III , mucosa clear , drainage- none, tonsils- atrophic, own teeth Neck- flexible , trachea midline, no stridor , thyroid nl, carotid no bruit Chest - symmetrical excursion , unlabored           Heart/CV- RRR , no murmur , no gallop  , no rub, nl s1 s2                           - JVD- none , edema- none, stasis changes- none, varices- none           Lung-  Wheeze-none, cough-none , dullness-none, rub- none           Chest wall-  Abd-  Br/ Gen/ Rectal- Not done, not indicated Extrem- cyanosis-  none, clubbing, none, atrophy- none, strength- nl  +L hand in brace after falling Neuro- grossly intact to observation Assessment and Plan:    Asthma Asthma is well-controlled with Trelegy 100 inhaler as maintenance therapy. She rarely uses a rescue inhaler, indicating effective asthma management. Sinus infections have slightly increased since mask-wearing decreased, but not to pre-COVID levels. - Continue Trelegy 100 inhaler as maintenance therapy - Refill Augmentin prescription for ten days to have on hand for potential sinus infection - Send prescription to [facility] on [address]  Sleep Apnea She experiences difficulty sleeping and has sleep apnea. She previously attempted CPAP therapy but was intolerant. Insomnia, characterized by difficulty falling asleep, may not be directly related to sleep apnea, but there is concern about its contribution to sleep disturbances. A home sleep test is planned to reassess sleep apnea status and guide management. - Order home sleep test to assess current status of sleep apnea - Advise her to continue using Lunesta during the sleep test if needed - Instruct her to call the office two weeks after the sleep study for results - Explain that the home sleep test will be conducted over one to three nights in her home environment, with results taking approximately two weeks to process  Insomnia She has difficulty falling asleep and relies on Lunesta, reporting no sleep without it. There is concern about the interaction between insomnia treatment and sleep apnea, as medications that suppress arousal could worsen sleep apnea. The home sleep test will help determine the extent of sleep apnea and guide insomnia management. - Evaluate results of home sleep test to determine appropriate management of insomnia in the context of sleep apnea

## 2023-12-03 ENCOUNTER — Encounter: Payer: Self-pay | Admitting: Internal Medicine

## 2023-12-03 ENCOUNTER — Ambulatory Visit: Payer: Medicare PPO | Admitting: Internal Medicine

## 2023-12-03 VITALS — BP 117/79 | HR 64 | Temp 97.7°F | Resp 18 | Ht 64.0 in | Wt 163.4 lb

## 2023-12-03 DIAGNOSIS — G4733 Obstructive sleep apnea (adult) (pediatric): Secondary | ICD-10-CM

## 2023-12-03 MED ORDER — AMOXICILLIN-POT CLAVULANATE 875-125 MG PO TABS: 1.0000 | ORAL_TABLET | Freq: Two times a day (BID) | ORAL | 1 refills | Status: DC

## 2023-12-03 NOTE — Patient Instructions (Signed)
 Augmentin refilled to hold  We can continue current asthma meds. Let us know if you need refills.  Order- schedule home sleep test dx OSA  Please call me for results about 2 weeks after your sleep test.

## 2023-12-05 ENCOUNTER — Telehealth: Payer: Self-pay

## 2023-12-05 DIAGNOSIS — R911 Solitary pulmonary nodule: Secondary | ICD-10-CM

## 2023-12-05 NOTE — Telephone Encounter (Signed)
 There was no specific imaging of the chest. A CT of her neck happened to see a tiny (6mm) spot in the top of her right lung. Radiology recommends a CT of the chest to look more closely.  I have ordered it.

## 2023-12-05 NOTE — Telephone Encounter (Signed)
 Copied from CRM [id number]. Topic: Clinical - Medical Advice >> Dec 04, 2023 10:33 AM [name] wrote: Reason for CRM: Patient saw Dr. Maple Hudson yesterday and forgot to ask him on his opinion on the CT scan she had done while in the hospital on 3/19 as there was a finding on her lung, please call patient back and advise.  Dr Maple Hudson, please advise

## 2023-12-06 NOTE — Telephone Encounter (Signed)
 I called and spoke to pt. Pt informed of Dr Roxy Cedar note and verbalized understanding. NFN

## 2023-12-14 ENCOUNTER — Encounter: Payer: Self-pay | Admitting: Obstetrics and Gynecology

## 2023-12-14 ENCOUNTER — Ambulatory Visit: Payer: Medicare PPO | Admitting: Obstetrics and Gynecology

## 2023-12-14 VITALS — BP 116/73 | HR 54 | Ht 63.25 in | Wt 161.0 lb

## 2023-12-14 DIAGNOSIS — R350 Frequency of micturition: Secondary | ICD-10-CM | POA: Diagnosis not present

## 2023-12-14 DIAGNOSIS — N3281 Overactive bladder: Secondary | ICD-10-CM | POA: Diagnosis not present

## 2023-12-14 DIAGNOSIS — Z87440 Personal history of urinary (tract) infections: Secondary | ICD-10-CM | POA: Diagnosis not present

## 2023-12-14 DIAGNOSIS — N390 Urinary tract infection, site not specified: Secondary | ICD-10-CM | POA: Insufficient documentation

## 2023-12-14 LAB — POCT URINALYSIS DIPSTICK
Bilirubin, UA: NEGATIVE
Blood, UA: NEGATIVE
Glucose, UA: NEGATIVE
Ketones, UA: NEGATIVE
Leukocytes, UA: NEGATIVE
Nitrite, UA: NEGATIVE
Protein, UA: NEGATIVE
Spec Grav, UA: <=1.005 — AB (ref 1.010–1.025)
Urobilinogen, UA: 0.2 U/dL
pH, UA: 5.5 (ref 5.0–8.0)

## 2023-12-14 MED ORDER — TROSPIUM CHLORIDE ER 60 MG PO CP24: 1.0000 | ORAL_CAPSULE | Freq: Every day | ORAL | 5 refills | Status: DC

## 2023-12-14 NOTE — Assessment & Plan Note (Signed)
-   For treatment of recurrent urinary tract infections, there is management with a prophylactic daily low dose antibiotic, transvaginal estrogen therapy, D-mannose, and cranberry supplements.   - Since she has been on estrace cream twice weekly, she has not had any UTI symptoms. Continue - Will obtain records from PCP for past urine cultures. If she has symptoms, she should call our office to give a urine sample for testing.

## 2023-12-14 NOTE — Progress Notes (Signed)
 New Patient Evaluation and Consultation  Referring Provider: Jerene Bears, MD PCP: Gweneth Dimitri, MD Date of Service: 12/14/2023  SUBJECTIVE Chief Complaint: New Patient (Initial Visit) [name] is a 73 y.o. female here for a consult for recurrent UTI. Pt said she also experiences incontinence.//)  History of Present Illness: [name] is a 73 y.o. White or Caucasian female seen in consultation at the request of Dr Hyacinth Meeker for evaluation of recurrent UTI.    Review of records significant for: Urine cultures:  08/31/23- no growth  Urinary Symptoms: Leaks urine with with movement to the bathroom and with urgency Leaks 1-4 time(s) per day.  Pad use: 1-2 pads per day.   Patient is bothered by UI symptoms. She had done pelvic PT in the past.  Has not previously been on medication for OAB.   Day time voids 6-8.  Nocturia: 2-3 times per night to void. Voiding dysfunction:  does not empty bladder well.  Patient does not use a catheter to empty bladder.  When urinating, patient feels a weak stream and the need to urinate multiple times in a row Drinks: 1 cup coffee or tea in AM, 4 bottles water, occasional celsius water, A&W diet root beer per day  UTIs: 4 UTI's in the last year.  Does not have any UTI symptoms today.  Denies history of blood in urine and kidney or bladder stones Reprots UTIs dx at [facility] 05/24/23, 07/04/23 and 08/10/23 which showed E.Coli. She does not recall which antibiotic she was treated with (maybe macrobid but could have been another as well).  Prescribed estradiol cream twice a week   Pelvic Organ Prolapse Symptoms:                  Patient Denies a feeling of a bulge the vaginal area.  Bowel Symptom: Bowel movements: every day or every other Stool consistency: soft  Straining: yes.  Splinting: yes.  Incomplete evacuation: yes.  Patient admits accidental bowel leakage / fecal incontinence- occasional Bowel regimen: diet, fiber, stool  softener, and miralax Started taking ozempic and this has increased her constipation, but has been well managed with colace and miralax.   HM Colonoscopy          Upcoming     Colonoscopy (Every 10 Years) Next due on 09/27/2032    09/27/2022  [facility] COLONOSCOPY   Only the first 1 history entries have been loaded, but more history exists.                Sexual Function Sexually active: no.  Sexual orientation: Straight Pain with sex: No  Pelvic Pain Denies pelvic pain   Past Medical History:  Past Medical History:  Diagnosis Date   Acid reflux    Anemia    "during the time I was taking meloxicam"   Arthritis    Asthma    LOV  6/13  Dr Maple Hudson EPIC/ clearance on chart  from 10/12   Depression    Dysrhythmia    PVC's with caffeine and anxiety   Heart murmur    since childhood   Hiatal hernia    High cholesterol    Hypertension    OV with clearance and note Dr Corliss Blacker 6/13 chart   Osteoporosis    Sleep apnea    no CPAP/ last study 9 yrs ago- "mild per patient"   STD (sexually transmitted disease)    HSV2   Stye 06/2009   eye     Past  Surgical History:   Past Surgical History:  Procedure Laterality Date   ACETABULAR REVISION Right 06/07/2022   Procedure: Right hip constrained liner versus dual mobility cup;  Surgeon: Ollen Gross, MD;  Location: [facility];  Service: Orthopedics;  Laterality: Right;   bilateral hip replacement Bilateral 6/07, 9/07   right, then left   BREAST REDUCTION SURGERY     CESAREAN SECTION     FRACTURE SURGERY     Left leg-1977   NASAL SEPTOPLASTY W/ TURBINOPLASTY Bilateral 07/19/2018   Procedure: NASAL SEPTOPLASTY WITH TURBINATE REDUCTION;  Surgeon: Osborn Coho, MD;  Location: [facility] OR;  Service: ENT;  Laterality: Bilateral;   NASAL SEPTUM SURGERY     REDUCTION MAMMAPLASTY     TOTAL KNEE ARTHROPLASTY  03/18/2012   Procedure: TOTAL KNEE ARTHROPLASTY;  Surgeon: Loanne Drilling, MD;  Location: [facility];  Service: Orthopedics;   Laterality: Left;   TOTAL KNEE ARTHROPLASTY Right 07/23/2017   Procedure: RIGHT TOTAL KNEE ARTHROPLASTY;  Surgeon: Ollen Gross, MD;  Location: [facility];  Service: Orthopedics;  Laterality: Right;     Past OB/GYN History: OB History  Gravida Para Term Preterm AB Living  1 1 1   1   SAB IAB Ectopic Multiple Live Births      1    # Outcome Date GA Lbr Len/2nd Weight Sex Type Anes PTL Lv  1 Term 12/1977    F CS-LTranv       Menopausal: Denies vaginal bleeding since menopause     Component Value Date/Time   DIAGPAP  02/08/2023 1502    - Negative for intraepithelial lesion or malignancy (NILM)   DIAGPAP  09/18/2019 1058    - Negative for intraepithelial lesion or malignancy (NILM)   HPVHIGH Negative 02/08/2023 1502   HPVHIGH Negative 09/18/2019 1058   ADEQPAP  02/08/2023 1502    Satisfactory for evaluation. The presence or absence of an   ADEQPAP  02/08/2023 1502    endocervical/transformation zone component cannot be determined because   ADEQPAP of atrophy. 02/08/2023 1502    Medications: Patient has a current medication list which includes the following prescription(s): semaglutide, trospium chloride, albuterol, amlodipine, atorvastatin, azelastine, b-complex with vitamin c, calcium carb-cholecalciferol, cetirizine, cholecalciferol, clonidine, cromolyn, prolia, docusate sodium, esomeprazole, estradiol, eszopiclone, fluticasone, trelegy ellipta, hydroquinone, magnesium, melatonin, methocarbamol, montelukast, prenatal multivit-min-fe-fa, probiotic product, and valacyclovir.   Allergies: Patient is allergic to meloxicam, meperidine, meperidine hcl, nadolol, and prevacid [lansoprazole].   Social History:  Social History   Tobacco Use   Smoking status: Never   Smokeless tobacco: Never  Vaping Use   Vaping status: Never Used  Substance Use Topics   Alcohol use: Yes    Alcohol/week: 0.0 - 1.0 standard drinks of alcohol   Drug use: No    Relationship status: married Patient  lives with husband.   Patient is not employed. Regular exercise: Yes: 150 min walking and weight wearing exercise per week History of abuse: No  Family History:   Family History  Problem Relation Age of Onset   Heart disease Father    Cancer Father    Hypertension Father    Kidney Stones Father    Arthritis Mother    Hypertension Mother    Pancreatic cancer Other    Coronary artery disease Other    Cancer Other    Asthma Other    Nephritis Daughter        glomerular     Review of Systems: Review of Systems  Constitutional:  Negative for fever, malaise/fatigue  and weight loss.  Respiratory:  Negative for cough, shortness of breath and wheezing.   Cardiovascular:  Negative for chest pain, palpitations and leg swelling.  Gastrointestinal:  Negative for abdominal pain and blood in stool.  Genitourinary:  Negative for dysuria.  Musculoskeletal:  Negative for myalgias.  Skin:  Negative for rash.  Neurological:  Negative for dizziness and headaches.  Endo/Heme/Allergies:  Does not bruise/bleed easily.  Psychiatric/Behavioral:  Negative for depression. The patient is not nervous/anxious.      OBJECTIVE Physical Exam: Vitals:   12/14/23 1027  BP: 116/73  Pulse: (!) 54  Weight: 161 lb (73 kg)  Height: 5' 3.25" (1.607 m)    Physical Exam Vitals reviewed. Exam conducted with a chaperone present.  Constitutional:      General: She is not in acute distress. Pulmonary:     Effort: Pulmonary effort is normal.  Abdominal:     General: There is no distension.     Palpations: Abdomen is soft.     Tenderness: There is no abdominal tenderness. There is no rebound.  Musculoskeletal:        General: No swelling. Normal range of motion.  Skin:    General: Skin is warm and dry.     Findings: No rash.  Neurological:     Mental Status: She is alert and oriented to person, place, and time.  Psychiatric:        Mood and Affect: Mood normal.        Behavior: Behavior normal.       GU / Detailed Urogynecologic Evaluation:  Pelvic Exam: Normal external female genitalia; Bartholin's and Skene's glands normal in appearance; urethral meatus normal in appearance, no urethral masses or discharge.   CST: negative  Speculum exam reveals normal vaginal mucosa with atrophy. Cervix normal appearance. Uterus normal single, nontender. Adnexa no mass, fullness, tenderness.     Pelvic floor strength II/V, puborectalis III/V external anal sphincter IV/V  Pelvic floor musculature: Right levator non-tender, Right obturator non-tender, Left levator non-tender, Left obturator non-tender  POP-Q:   POP-Q  0                                            Aa   0                                           Ba  -7                                              C   4                                            Gh  5.5                                            Pb  8  tvl   -2.5                                            Ap  -2.5                                            Bp  -7                                              D      Rectal Exam:  Normal sphincter tone, no distal rectocele, enterocoele not present, no rectal masses, no sign of dyssynergia when asking the patient to bear down.  Post-Void Residual (PVR) by Bladder Scan: In order to evaluate bladder emptying, we discussed obtaining a postvoid residual and patient agreed to this procedure.  Procedure: The ultrasound unit was placed on the patient's abdomen in the suprapubic region after the patient had voided.    Post Void Residual - 12/14/23 1105       Post Void Residual   Post Void Residual 3 mL              Laboratory Results: Lab Results  Component Value Date   COLORU Yellow 12/14/2023   CLARITYU Clear 12/14/2023   GLUCOSEUR Negative 12/14/2023   BILIRUBINUR Negative 12/14/2023   KETONESU Negative 12/14/2023   SPECGRAV <=1.005 (A) 12/14/2023    RBCUR Negative 12/14/2023   PHUR 5.5 12/14/2023   PROTEINUR Negative 12/14/2023   UROBILINOGEN 0.2 12/14/2023   LEUKOCYTESUR Negative 12/14/2023    Lab Results  Component Value Date   CREATININE 0.66 06/08/2022   CREATININE 0.80 05/25/2022   CREATININE 0.88 07/15/2018    No results found for: "HGBA1C"  Lab Results  Component Value Date   HGB 12.8 06/08/2022     ASSESSMENT AND PLAN Ms. Ferdinand is a 73 y.o. with:  1. Overactive bladder   2. Urinary frequency   3. Recurrent urinary tract infection     Overactive bladder Assessment & Plan: - We discussed the symptoms of overactive bladder (OAB), which include urinary urgency, urinary frequency, nocturia, with or without urge incontinence.  While we do not know the exact etiology of OAB, several treatment options exist. We discussed management including behavioral therapy (decreasing bladder irritants, urge suppression strategies, timed voids, bladder retraining), physical therapy, medication; for refractory cases posterior tibial nerve stimulation, sacral neuromodulation, and intravesical botulinum toxin injection.  - recommended decreasing bladder irritants.  - She was initially interested in PTNS but cost of copay too high with each visit.  - Prescribed trospium 60mg  ER. For anticholinergic medications, we discussed the potential side effects of anticholinergics including dry eyes, dry mouth, constipation, cognitive impairment and urinary retention.    Orders: -     Trospium Chloride ER; Take 1 capsule (60 mg total) by mouth daily.  Dispense: 30 capsule; Refill: 5  Urinary frequency -     POCT urinalysis dipstick  Recurrent urinary tract infection Assessment & Plan: - For treatment of recurrent urinary tract infections, there is management with a prophylactic daily low dose antibiotic, transvaginal estrogen therapy, D-mannose, and cranberry supplements.   - Since she  has been on estrace cream twice weekly, she has not had  any UTI symptoms. Continue - Will obtain records from PCP for past urine cultures. If she has symptoms, she should call our office to give a urine sample for testing.     Return 6 weeks   Marguerita Beards, MD

## 2023-12-14 NOTE — Patient Instructions (Signed)

## 2023-12-14 NOTE — Assessment & Plan Note (Signed)
-   We discussed the symptoms of overactive bladder (OAB), which include urinary urgency, urinary frequency, nocturia, with or without urge incontinence.  While we do not know the exact etiology of OAB, several treatment options exist. We discussed management including behavioral therapy (decreasing bladder irritants, urge suppression strategies, timed voids, bladder retraining), physical therapy, medication; for refractory cases posterior tibial nerve stimulation, sacral neuromodulation, and intravesical botulinum toxin injection.  - recommended decreasing bladder irritants.  - She was initially interested in PTNS but cost of copay too high with each visit.  - Prescribed trospium 60mg  ER. For anticholinergic medications, we discussed the potential side effects of anticholinergics including dry eyes, dry mouth, constipation, cognitive impairment and urinary retention.

## 2023-12-24 ENCOUNTER — Ambulatory Visit
Admission: RE | Admit: 2023-12-24 | Discharge: 2023-12-24 | Disposition: A | Discharge status: Home / Self Care | Source: Ambulatory Visit | Attending: Internal Medicine | Admitting: Internal Medicine

## 2023-12-24 DIAGNOSIS — I700 Atherosclerosis of aorta: Secondary | ICD-10-CM | POA: Diagnosis not present

## 2023-12-24 DIAGNOSIS — E041 Nontoxic single thyroid nodule: Secondary | ICD-10-CM | POA: Diagnosis not present

## 2023-12-24 DIAGNOSIS — I2510 Atherosclerotic heart disease of native coronary artery without angina pectoris: Secondary | ICD-10-CM | POA: Diagnosis not present

## 2023-12-24 DIAGNOSIS — R918 Other nonspecific abnormal finding of lung field: Secondary | ICD-10-CM | POA: Diagnosis not present

## 2023-12-26 ENCOUNTER — Other Ambulatory Visit: Payer: Self-pay

## 2023-12-26 ENCOUNTER — Ambulatory Visit: Admitting: Orthopedic Surgery

## 2023-12-26 DIAGNOSIS — S6292XD Unspecified fracture of left wrist and hand, subsequent encounter for fracture with routine healing: Secondary | ICD-10-CM | POA: Diagnosis not present

## 2023-12-26 DIAGNOSIS — S6292XA Unspecified fracture of left wrist and hand, initial encounter for closed fracture: Secondary | ICD-10-CM

## 2023-12-26 DIAGNOSIS — S6291XD Unspecified fracture of right wrist and hand, subsequent encounter for fracture with routine healing: Secondary | ICD-10-CM

## 2023-12-26 NOTE — Progress Notes (Unsigned)
 [name] - 73 y.o. female MRN [id number]  Date of birth: [birth year]  Office Visit Note: Visit Date: 12/26/2023 PCP: Helyn Lobstein, MD Referred by: Helyn Lobstein, MD  Subjective: No chief complaint on file.  HPI: [name] is a pleasant 73 y.o. female who presents today for follow up of left small finger metacarpal fracture being managed with bracing.  She is doing very well overall, minimal pain today.    Pertinent ROS were reviewed with the patient and found to be negative unless otherwise specified above in HPI.    Assessment & Plan: Visit Diagnoses:  1. Closed fracture of left hand, initial encounter   2. Closed fracture of left hand with routine healing, subsequent encounter     Plan: She is doing very well overall with healing the left small finger metacarpal fracture.  At this juncture, she should continue doing range of motion exercises as instructed, range of motion is excellent on examination today.  I have recommended that she continue utilizing her removable brace for an additional 2 weeks to allow for ongoing healing and stability, at that juncture she can begin strengthening.  Given her excellent healing, I recommended she return to me as needed moving forward.  She expressed full understanding.     Follow-up: No follow-ups on file.   Meds & Orders: No orders of the defined types were placed in this encounter.   Orders Placed This Encounter  Procedures   XR Hand Complete Right   XR Hand Complete Left     Procedures: No procedures performed      Clinical History: No specialty comments available.  She reports that she has never smoked. She has never used smokeless tobacco. No results for input(s): "HGBA1C", "LABURIC" in the last 8760 hours.  Objective:   Vital Signs: LMP 09/05/2003   Physical Exam  Gen: Well-appearing, in no acute distress; non-toxic CV: Regular Rate. Well-perfused. Warm.  Resp: Breathing unlabored on room air; no  wheezing. Psych: Fluid speech in conversation; appropriate affect; normal thought process  Ortho Exam Left hand: - Minimal tenderness over the small finger metacarpal - Able to perform composite fist without significant restriction - Median/radial/ulnar sensitivities intact, AIN/PIN/interosseous intact - Hand remains warm well-perfused  Imaging: X-ray of the left hand reviewed in detail today  Past Medical/Family/Surgical/Social History: Medications & Allergies reviewed per EMR, new medications updated. Patient Active Problem List   Diagnosis Date Noted   Overactive bladder 12/14/2023   Recurrent urinary tract infection 12/14/2023   Prediabetes 02/08/2023   Osteoarthritis of right hip 05/15/2022   Postmenopausal 12/05/2020   Urge incontinence of urine 12/05/2020   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   Age-related osteoporosis without current pathological fracture 12/02/2020   Deviated septum 10/04/2016   Nasal turbinate hypertrophy 10/04/2016   Itchy eyes 09/13/2016   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 smear of cervix 01/24/2016   Acute bronchitis 11/25/2015   Postop Hyponatremia 03/20/2012   OA (osteoarthritis) of knee 03/18/2012   Sinusitis, chronic 11/25/2008   Obstructive sleep apnea 06/14/2007   HYPERTENSION 06/14/2007   Seasonal and perennial allergic rhinitis 06/14/2007   Asthma, moderate persistent 06/14/2007   GERD 06/14/2007   Past Medical History:  Diagnosis Date   Acid reflux    Anemia    "during the time I was taking meloxicam"   Arthritis    Asthma    LOV  6/13  Dr Linder Revere EPIC/ clearance on chart  from 10/12  Depression    Dysrhythmia    PVC's with caffeine and anxiety   Heart murmur    since childhood   Hiatal hernia    High cholesterol    Hypertension    OV with clearance and note Dr Alphonzo Jenkins 6/13 chart   Osteoporosis    Sleep apnea    no CPAP/ last study 9 yrs ago- "mild per patient"   STD  (sexually transmitted disease)    HSV2   Stye 06/2009   eye   Family History  Problem Relation Age of Onset   Heart disease Father    Cancer Father    Hypertension Father    Kidney Stones Father    Arthritis Mother    Hypertension Mother    Pancreatic cancer Other    Coronary artery disease Other    Cancer Other    Asthma Other    Nephritis Daughter        glomerular   Past Surgical History:  Procedure Laterality Date   ACETABULAR REVISION Right 06/07/2022   Procedure: Right hip constrained liner versus dual mobility cup;  Surgeon: Liliane Rei, MD;  Location: [facility];  Service: Orthopedics;  Laterality: Right;   bilateral hip replacement Bilateral 6/07, 9/07   right, then left   BREAST REDUCTION SURGERY     CESAREAN SECTION     FRACTURE SURGERY     Left leg-1977   NASAL SEPTOPLASTY W/ TURBINOPLASTY Bilateral 07/19/2018   Procedure: NASAL SEPTOPLASTY WITH TURBINATE REDUCTION;  Surgeon: Ammon Bales, MD;  Location: [facility] OR;  Service: ENT;  Laterality: Bilateral;   NASAL SEPTUM SURGERY     REDUCTION MAMMAPLASTY     TOTAL KNEE ARTHROPLASTY  03/18/2012   Procedure: TOTAL KNEE ARTHROPLASTY;  Surgeon: Aurther Blue, MD;  Location: [facility];  Service: Orthopedics;  Laterality: Left;   TOTAL KNEE ARTHROPLASTY Right 07/23/2017   Procedure: RIGHT TOTAL KNEE ARTHROPLASTY;  Surgeon: Liliane Rei, MD;  Location: [facility];  Service: Orthopedics;  Laterality: Right;   Social History   Occupational History   Occupation: RETIRED    Employer: RETIRED  Tobacco Use   Smoking status: Never   Smokeless tobacco: Never  Vaping Use   Vaping status: Never Used  Substance and Sexual Activity   Alcohol  use: Yes    Alcohol /week: 0.0 - 1.0 standard drinks of alcohol    Drug use: No   Sexual activity: Not Currently    Partners: Male    Quentin Shorey Merlinda Starling) Marce Sensing, M.D. [facility], Hand Surgery

## 2023-12-27 ENCOUNTER — Encounter

## 2023-12-27 DIAGNOSIS — G4730 Sleep apnea, unspecified: Secondary | ICD-10-CM | POA: Diagnosis not present

## 2023-12-27 DIAGNOSIS — H524 Presbyopia: Secondary | ICD-10-CM | POA: Diagnosis not present

## 2023-12-27 DIAGNOSIS — G4733 Obstructive sleep apnea (adult) (pediatric): Secondary | ICD-10-CM

## 2024-01-04 ENCOUNTER — Encounter: Payer: Self-pay | Admitting: Internal Medicine

## 2024-01-08 DIAGNOSIS — M5030 Other cervical disc degeneration, unspecified cervical region: Secondary | ICD-10-CM | POA: Diagnosis not present

## 2024-01-08 DIAGNOSIS — Z8639 Personal history of other endocrine, nutritional and metabolic disease: Secondary | ICD-10-CM | POA: Diagnosis not present

## 2024-01-08 DIAGNOSIS — E041 Nontoxic single thyroid nodule: Secondary | ICD-10-CM | POA: Diagnosis not present

## 2024-01-08 DIAGNOSIS — Z6826 Body mass index (BMI) 26.0-26.9, adult: Secondary | ICD-10-CM | POA: Diagnosis not present

## 2024-01-08 DIAGNOSIS — E663 Overweight: Secondary | ICD-10-CM | POA: Diagnosis not present

## 2024-01-08 DIAGNOSIS — K5903 Drug induced constipation: Secondary | ICD-10-CM | POA: Diagnosis not present

## 2024-01-08 DIAGNOSIS — I10 Essential (primary) hypertension: Secondary | ICD-10-CM | POA: Diagnosis not present

## 2024-01-08 DIAGNOSIS — R7303 Prediabetes: Secondary | ICD-10-CM | POA: Diagnosis not present

## 2024-01-10 NOTE — Telephone Encounter (Signed)
 Patient is calling today cause no one has still reached to her concerning the ct scan results please reach out to patient with this information  [phone]

## 2024-01-10 NOTE — Telephone Encounter (Signed)
 I called cal and they messaged [name] and she said she will give the patient a call back . Relaying to patient . Patient said ok. [phone]

## 2024-01-11 ENCOUNTER — Telehealth: Payer: Self-pay

## 2024-01-11 DIAGNOSIS — R911 Solitary pulmonary nodule: Secondary | ICD-10-CM

## 2024-01-11 NOTE — Telephone Encounter (Signed)
 I called and spoke to pt. Pt informed of Dr Antonette Batters note. I will order CT scan and pt knows to advise with PCP regarding the thyroid  nodule. NFN

## 2024-01-14 ENCOUNTER — Telehealth: Payer: Self-pay

## 2024-01-14 NOTE — Telephone Encounter (Signed)
 Copied from CRM [id number]. Topic: Clinical - Lab/Test Results >> Jan 10, 2024  3:03 PM Evie Hoff wrote: Reason for CRM: patient is requesting that ct results be sent to primary care doctor Dr. Helyn Lobstein at  [facility]  CT sent to PCP. Sending Mychart so pt is aware.

## 2024-01-15 DIAGNOSIS — G4733 Obstructive sleep apnea (adult) (pediatric): Secondary | ICD-10-CM | POA: Diagnosis not present

## 2024-02-01 ENCOUNTER — Encounter: Payer: Self-pay | Admitting: Obstetrics and Gynecology

## 2024-02-01 ENCOUNTER — Ambulatory Visit: Admitting: Obstetrics and Gynecology

## 2024-02-01 VITALS — BP 130/84 | HR 64

## 2024-02-01 DIAGNOSIS — N3281 Overactive bladder: Secondary | ICD-10-CM | POA: Diagnosis not present

## 2024-02-01 MED ORDER — VIBEGRON 75 MG PO TABS: 1.0000 | ORAL_TABLET | Freq: Every day | ORAL | 5 refills | Status: DC

## 2024-02-01 NOTE — Patient Instructions (Addendum)
 Try Gemtesa for 3 weeks with samples. If you see more improvement, we can try to get it approved. Otherwise if you do not see improvement, we can go back on the trospium .

## 2024-02-01 NOTE — Progress Notes (Signed)
 [facility] Return Visit  SUBJECTIVE  History of Present Illness: [name] is a 73 y.o. female seen in follow-up for OAB and recurrent UTI. Plan at last visit was to start Trospium  60mg  ER daily. She has also been on vaginal estrace  cream twice a week.   Still having some leakage about twice a day, a moderate amount. This usually happens as she is coming home. Has noticed less urgency overall.   Past Medical History: Patient  has a past medical history of Acid reflux, Anemia, Arthritis, Asthma, Depression, Dysrhythmia, Heart murmur, Hiatal hernia, High cholesterol, Hypertension, Osteoporosis, Sleep apnea, STD (sexually transmitted disease), and Stye (06/2009).   Past Surgical History: She  has a past surgical history that includes Cesarean section; Breast reduction surgery; Nasal septum surgery; bilateral hip replacement (Bilateral, 6/07, 9/07); Total knee arthroplasty (03/18/2012); Fracture surgery; Total knee arthroplasty (Right, 07/23/2017); Nasal septoplasty w/ turbinoplasty (Bilateral, 07/19/2018); Reduction mammaplasty; and Acetabular revision (Right, 06/07/2022).   Medications: She has a current medication list which includes the following prescription(s): albuterol , amlodipine , atorvastatin , azelastine , b-complex with vitamin c, calcium  carb-cholecalciferol, cetirizine, cholecalciferol, clonidine , cromolyn , prolia , docusate sodium , esomeprazole , estradiol , eszopiclone , fluticasone , trelegy ellipta , hydroquinone , magnesium , melatonin, methocarbamol , montelukast , prenatal multivit-min-fe-fa, probiotic product, semaglutide, valacyclovir , and vibegron.   Allergies: Patient is allergic to meloxicam, meperidine, meperidine hcl, nadolol, and prevacid [lansoprazole].   Social History: Patient  reports that she has never smoked. She has never used smokeless tobacco. She reports current alcohol  use. She reports that she does not use drugs.     OBJECTIVE     Physical  Exam: Vitals:   02/01/24 0908  BP: 130/84  Pulse: 64   Gen: No apparent distress, A&O x 3.  Detailed Urogynecologic Evaluation:  Deferred.    ASSESSMENT AND PLAN    [name] is a 73 y.o. with:  1. Overactive bladder     Overactive bladder -     Vibegron; Take 1 tablet (75 mg total) by mouth daily.  Dispense: 30 tablet; Refill: 5  - Ordered gemtesa and provided with 3 weeks of samples. If she sees better improvement, then she can fill the prescription.  - If not a significant difference, then she can stay on the trospium  for now.  - We discussed alternative therapies like pelvic PT, PTNS, SNM and botox. She was maybe interested in PTNS but she is traveling some this summer so will stay on the medication.   Follow up 4 months or sooner if needed   Arma Lamp, MD

## 2024-02-04 NOTE — Progress Notes (Signed)
 Patient ID: [name], female    DOB: [birth year], 72 y.o.   MRN: [id number]  HPI  F never smoker folowed for asthma, allergic rhinitis, complicated by hx OSA/ oral appliance, HBP, GERD PFT 05/30/10- FEV1 2.56/ 91%, FEV1/FVC 0.73, DLCO 84%  within normal limits. NPSG-04/21/14- Moderate obstructive sleep apnea, AHI 21/ hr, CPAP to 14, weight 185 lbs HST 12/27/23- AHI 16.9/hr, desat to 77%, time with sat </= 88% was 56 minutes, body weight 160 lbs -----------------------------------------------------------------    -Assessment and Plan:    Asthma Asthma is well-controlled with Trelegy 100 inhaler as maintenance therapy. She rarely uses a rescue inhaler, indicating effective asthma management. Sinus infections have slightly increased since mask-wearing decreased, but not to pre-COVID levels. - Continue Trelegy 100 inhaler as maintenance therapy - Refill Augmentin  prescription for ten days to have on hand for potential sinus infection - Send prescription to [facility] on [address]  Sleep Apnea She experiences difficulty sleeping and has sleep apnea. She previously attempted CPAP therapy but was intolerant. Insomnia, characterized by difficulty falling asleep, may not be directly related to sleep apnea, but there is concern about its contribution to sleep disturbances. A home sleep test is planned to reassess sleep apnea status and guide management. - Order home sleep test to assess current status of sleep apnea - Advise her to continue using Lunesta  during the sleep test if needed - Instruct her to call the office two weeks after the sleep study for results - Explain that the home sleep test will be conducted over one to three nights in her home environment, with results taking approximately two weeks to process  Insomnia She has difficulty falling asleep and relies on Lunesta , reporting no sleep without it. There is concern about the interaction between insomnia treatment and sleep apnea,  as medications that suppress arousal could worsen sleep apnea. The home sleep test will help determine the extent of sleep apnea and guide insomnia management. - Evaluate results of home sleep test to determine appropriate management of insomnia in the context of sleep apnea     02/05/24- 73 year old female never smoker followed for Asthma, Allergic Rhinitis, OSA, complicated by HBP, GERD, OA, CAD,  -Singulair , Trelegy100, albuterol  HFA, Lunesta  Body weight today 155 lbs HST 12/27/23- AHI 16.9/hr, desat to 77%, time with sat </= 88% was 56 minutes, body weight 160 lbs Reviewed CT result and HST. Can continue Lunesta . We will repeat CT in August, per radiology,  We discussed treatment options for OSA and she chooses to retry CPAP with new DME, auto 5-15. CT chest 12/24/23- MPRESSION: 1. Bilateral pulmonary nodules, the largest within the left upper lobe measuring 9 mm. These are indeterminate but possibly inflammatory in nature. Short-term follow-up imaging in 3 months is recommended to document stability or resolution. 2. Extensive multi-vessel coronary artery calcification. 3. Calcification of the aortic valve leaflets. Echocardiography may be helpful to assess the degree of valvular dysfunction. 4. 2.2 cm right thyroid  nodule. Recommend thyroid  US . 5. Fusiform dilation of the ascending aorta measuring 4.1 cm in greatest dimension. Recommend annual imaging followup by CTA or MRA. This recommendation follows 2010 ACCF/AHA/AATS/ACR/ASA/SCA/SCAI/SIR/STS/SVM Guidelines for the Diagnosis and Management of Patients with Thoracic Aortic Disease. Circulation. 2010; 121: Z733-z630. Aortic aneurysm NOS (ICD10-I71.9) 6. Enlarged central pulmonary arteries in keeping with changes of pulmonary arterial hypertension.  Review of Systems- see HPI  + = positive Constitutional:   +  weight loss- see HPI, night sweats, fevers, chills, +fatigue, lassitude. HEENT:   +  headaches, difficulty swallowing,  tooth/dental problems, sore throat,       No-  sneezing, itching, ear ache, +nasal congestion, post nasal drip,  CV:  No-   chest pain, orthopnea, PND, swelling in lower extremities, anasarca,  dizziness, palpitations Resp: No-   shortness of breath with exertion or at rest.              No-   productive cough, +non-productive cough,  No- coughing up of blood.              No-   change in color of mucus. +wheezing.   Skin: No-   rash or lesions. GI:  +heartburn, indigestion, abdominal pain, nausea, vomiting, GU: . MS:  See HPI- + joint pain or swelling.   Neuro-     nothing unusual Psych:  No- change in mood or affect. No depression or anxiety.  No memory loss.  OBJ General- Alert, Oriented, Affect-appropriate, Distress- none acute.  + Overweight Skin- rash-none, lesions- none, excoriation- none Lymphadenopathy- none Head- atraumatic            Eyes- Gross vision intact, PERRLA, conjunctivae clear            Ears- Hearing, canals-normal            Nose- mucus, no-polyps, erosion, perforation             Throat- Mallampati III , mucosa clear , drainage- none, tonsils- atrophic, own teeth Neck- flexible , trachea midline, no stridor , thyroid  nl, carotid no bruit Chest - symmetrical excursion , unlabored           Heart/CV- RRR , no murmur , no gallop  , no rub, nl s1 s2                           - JVD- none , edema- none, stasis changes- none, varices- none           Lung-  Wheeze-none, cough-none , dullness-none, rub- none           Chest wall-  Abd-  Br/ Gen/ Rectal- Not done, not indicated Extrem- cyanosis- none, clubbing, none, atrophy- none, strength- nl  +L hand in brace after falling Neuro- grossly intact to observation

## 2024-02-05 ENCOUNTER — Ambulatory Visit: Admitting: Internal Medicine

## 2024-02-05 ENCOUNTER — Encounter: Payer: Self-pay | Admitting: Internal Medicine

## 2024-02-05 VITALS — BP 120/84 | HR 68 | Temp 97.9°F | Ht 64.0 in | Wt 155.8 lb

## 2024-02-05 DIAGNOSIS — G4700 Insomnia, unspecified: Secondary | ICD-10-CM | POA: Diagnosis not present

## 2024-02-05 DIAGNOSIS — F5101 Primary insomnia: Secondary | ICD-10-CM

## 2024-02-05 DIAGNOSIS — J302 Other seasonal allergic rhinitis: Secondary | ICD-10-CM

## 2024-02-05 DIAGNOSIS — G4733 Obstructive sleep apnea (adult) (pediatric): Secondary | ICD-10-CM

## 2024-02-05 DIAGNOSIS — J3089 Other allergic rhinitis: Secondary | ICD-10-CM

## 2024-02-05 MED ORDER — AMOXICILLIN-POT CLAVULANATE 875-125 MG PO TABS: ORAL_TABLET | ORAL | 4 refills | Status: DC

## 2024-02-05 NOTE — Patient Instructions (Signed)
 Keep appointment for repeat CT chest in August  Order- new DME new CPAP auto 5-15, mask of choice, humidifier, supplies, Airview/ card  [facility] to continue lunesta , melatonin and your nasal sprays as discussed   Script sent for augmentin  to keep on hand.

## 2024-02-12 DIAGNOSIS — E663 Overweight: Secondary | ICD-10-CM | POA: Diagnosis not present

## 2024-02-12 DIAGNOSIS — M5030 Other cervical disc degeneration, unspecified cervical region: Secondary | ICD-10-CM | POA: Diagnosis not present

## 2024-02-12 DIAGNOSIS — I10 Essential (primary) hypertension: Secondary | ICD-10-CM | POA: Diagnosis not present

## 2024-02-12 DIAGNOSIS — G4733 Obstructive sleep apnea (adult) (pediatric): Secondary | ICD-10-CM | POA: Diagnosis not present

## 2024-02-12 DIAGNOSIS — Z6825 Body mass index (BMI) 25.0-25.9, adult: Secondary | ICD-10-CM | POA: Diagnosis not present

## 2024-02-12 DIAGNOSIS — K5903 Drug induced constipation: Secondary | ICD-10-CM | POA: Diagnosis not present

## 2024-02-12 DIAGNOSIS — Z8639 Personal history of other endocrine, nutritional and metabolic disease: Secondary | ICD-10-CM | POA: Diagnosis not present

## 2024-02-13 ENCOUNTER — Other Ambulatory Visit (HOSPITAL_BASED_OUTPATIENT_CLINIC_OR_DEPARTMENT_OTHER): Payer: Self-pay | Admitting: *Deleted

## 2024-02-13 ENCOUNTER — Ambulatory Visit: Payer: Medicare PPO | Admitting: Podiatry

## 2024-02-13 ENCOUNTER — Encounter: Payer: Self-pay | Admitting: Podiatry

## 2024-02-13 DIAGNOSIS — B351 Tinea unguium: Secondary | ICD-10-CM | POA: Diagnosis not present

## 2024-02-13 DIAGNOSIS — M79676 Pain in unspecified toe(s): Secondary | ICD-10-CM

## 2024-02-13 MED ORDER — VALACYCLOVIR HCL 500 MG PO TABS: 500.0000 mg | ORAL_TABLET | Freq: Every day | ORAL | Status: DC

## 2024-02-14 DIAGNOSIS — E041 Nontoxic single thyroid nodule: Secondary | ICD-10-CM | POA: Diagnosis not present

## 2024-02-14 DIAGNOSIS — E042 Nontoxic multinodular goiter: Secondary | ICD-10-CM | POA: Diagnosis not present

## 2024-02-19 NOTE — Progress Notes (Signed)
  Subjective:  Patient ID: [name], female    DOB: 1951/08/25,  MRN: [id number]  73 y.o. female presents painful mycotic toenails of both feet that are difficult to trim. Pain interferes with daily activities and wearing enclosed shoe gear comfortably.  Chief Complaint  Patient presents with   RFC    Rm15/RFC/not diabetic/pcp visit May 2025    New problem(s): None   PCP is Helyn Lobstein, MD.  Allergies  Allergen Reactions   Meloxicam Other (See Comments)    Blood count went down    Meperidine Nausea And Vomiting and Other (See Comments)   Meperidine Hcl Other (See Comments)   Nadolol Other (See Comments)    Low blood pressure   Prevacid [Lansoprazole] Other (See Comments)    Unknown reaction (patient does not recall)    Review of Systems: Negative except as noted in the HPI.   Objective:  [name] is a pleasant 73 y.o. female WD, WN in NAD. AAO x 3.  Vascular Examination: Vascular status intact b/l with palpable pedal pulses. CFT immediate b/l. Pedal hair present. No edema. No pain with calf compression b/l. Skin temperature gradient WNL b/l. No varicosities noted. No cyanosis or clubbing noted.  Neurological Examination: Sensation grossly intact b/l with 10 gram monofilament. Vibratory sensation intact b/l.  Dermatological Examination: Pedal skin with normal turgor, texture and tone b/l. No open wounds nor interdigital macerations noted. Toenails 1-5 b/l thick, discolored, elongated with subungual debris and pain on dorsal palpation. No hyperkeratotic lesions noted b/l.   Musculoskeletal Examination: Muscle strength 5/5 to b/l LE.  No pain, crepitus noted b/l. HAV with bunion deformity noted b/l LE.  Radiographs: None  Last A1c:       No data to display           Assessment:   1. Pain due to onychomycosis of toenail    Plan:  Consent given for treatment. Patient examined. All patient's and/or POA's questions/concerns addressed on today's  visit.Toenails 1-5 debrided in length and girth without incident. Continue soft, supportive shoe gear daily. Report any pedal injuries to medical professional. Call office if there are any questions/concerns. -Patient/POA to call should there be question/concern in the interim.  Return in about 3 months (around 05/15/2024).  Luella Sager, DPM      [place] LOCATION: [address] [place] [address]                   Office [phone]   [facility] LOCATION: [address] [place], [place] [address] Office [phone]

## 2024-02-26 ENCOUNTER — Other Ambulatory Visit: Payer: Self-pay | Admitting: Family Medicine

## 2024-02-26 DIAGNOSIS — E559 Vitamin D deficiency, unspecified: Secondary | ICD-10-CM | POA: Diagnosis not present

## 2024-02-26 DIAGNOSIS — M810 Age-related osteoporosis without current pathological fracture: Secondary | ICD-10-CM | POA: Diagnosis not present

## 2024-02-26 DIAGNOSIS — R634 Abnormal weight loss: Secondary | ICD-10-CM | POA: Diagnosis not present

## 2024-02-26 DIAGNOSIS — E041 Nontoxic single thyroid nodule: Secondary | ICD-10-CM

## 2024-02-28 ENCOUNTER — Ambulatory Visit (HOSPITAL_BASED_OUTPATIENT_CLINIC_OR_DEPARTMENT_OTHER): Payer: Medicare PPO | Admitting: Obstetrics & Gynecology

## 2024-02-29 DIAGNOSIS — R911 Solitary pulmonary nodule: Secondary | ICD-10-CM | POA: Diagnosis not present

## 2024-02-29 DIAGNOSIS — M810 Age-related osteoporosis without current pathological fracture: Secondary | ICD-10-CM | POA: Diagnosis not present

## 2024-02-29 DIAGNOSIS — I10 Essential (primary) hypertension: Secondary | ICD-10-CM | POA: Diagnosis not present

## 2024-02-29 DIAGNOSIS — E041 Nontoxic single thyroid nodule: Secondary | ICD-10-CM | POA: Diagnosis not present

## 2024-02-29 DIAGNOSIS — E559 Vitamin D deficiency, unspecified: Secondary | ICD-10-CM | POA: Diagnosis not present

## 2024-03-05 DIAGNOSIS — G4733 Obstructive sleep apnea (adult) (pediatric): Secondary | ICD-10-CM | POA: Diagnosis not present

## 2024-03-10 ENCOUNTER — Ambulatory Visit
Admission: RE | Admit: 2024-03-10 | Discharge: 2024-03-10 | Disposition: A | Discharge status: Home / Self Care | Source: Ambulatory Visit | Attending: Family Medicine | Admitting: Family Medicine

## 2024-03-10 ENCOUNTER — Other Ambulatory Visit (HOSPITAL_COMMUNITY)
Admission: RE | Admit: 2024-03-10 | Discharge: 2024-03-10 | Disposition: A | Discharge status: Home / Self Care | Source: Ambulatory Visit | Attending: Family Medicine | Admitting: Family Medicine

## 2024-03-10 DIAGNOSIS — E041 Nontoxic single thyroid nodule: Secondary | ICD-10-CM | POA: Diagnosis not present

## 2024-03-12 ENCOUNTER — Encounter: Payer: Self-pay | Admitting: Internal Medicine

## 2024-03-12 ENCOUNTER — Ambulatory Visit (HOSPITAL_COMMUNITY): Payer: Self-pay | Admitting: Student

## 2024-03-12 DIAGNOSIS — G4700 Insomnia, unspecified: Secondary | ICD-10-CM | POA: Insufficient documentation

## 2024-03-12 LAB — CYTOLOGY - NON PAP

## 2024-03-12 NOTE — Assessment & Plan Note (Signed)
 Agrees to try again with CPAP Plan- new DME, new CPAP auto 5-15

## 2024-03-12 NOTE — Assessment & Plan Note (Signed)
 Lunesta  effective and well tolerated. We can refill.

## 2024-03-12 NOTE — Assessment & Plan Note (Signed)
 Continue nasal meds

## 2024-03-20 DIAGNOSIS — R2689 Other abnormalities of gait and mobility: Secondary | ICD-10-CM | POA: Diagnosis not present

## 2024-03-20 DIAGNOSIS — M6281 Muscle weakness (generalized): Secondary | ICD-10-CM | POA: Diagnosis not present

## 2024-03-25 ENCOUNTER — Ambulatory Visit (HOSPITAL_BASED_OUTPATIENT_CLINIC_OR_DEPARTMENT_OTHER): Admitting: Obstetrics & Gynecology

## 2024-03-27 DIAGNOSIS — K5903 Drug induced constipation: Secondary | ICD-10-CM | POA: Diagnosis not present

## 2024-03-27 DIAGNOSIS — E042 Nontoxic multinodular goiter: Secondary | ICD-10-CM | POA: Diagnosis not present

## 2024-03-27 DIAGNOSIS — I10 Essential (primary) hypertension: Secondary | ICD-10-CM | POA: Diagnosis not present

## 2024-03-27 DIAGNOSIS — M5030 Other cervical disc degeneration, unspecified cervical region: Secondary | ICD-10-CM | POA: Diagnosis not present

## 2024-03-27 DIAGNOSIS — Z8639 Personal history of other endocrine, nutritional and metabolic disease: Secondary | ICD-10-CM | POA: Diagnosis not present

## 2024-03-27 DIAGNOSIS — E663 Overweight: Secondary | ICD-10-CM | POA: Diagnosis not present

## 2024-03-27 DIAGNOSIS — G4733 Obstructive sleep apnea (adult) (pediatric): Secondary | ICD-10-CM | POA: Diagnosis not present

## 2024-03-27 DIAGNOSIS — Z6826 Body mass index (BMI) 26.0-26.9, adult: Secondary | ICD-10-CM | POA: Diagnosis not present

## 2024-03-28 DIAGNOSIS — M6281 Muscle weakness (generalized): Secondary | ICD-10-CM | POA: Diagnosis not present

## 2024-03-28 DIAGNOSIS — R2689 Other abnormalities of gait and mobility: Secondary | ICD-10-CM | POA: Diagnosis not present

## 2024-04-02 DIAGNOSIS — R9389 Abnormal findings on diagnostic imaging of other specified body structures: Secondary | ICD-10-CM | POA: Diagnosis not present

## 2024-04-02 DIAGNOSIS — I2510 Atherosclerotic heart disease of native coronary artery without angina pectoris: Secondary | ICD-10-CM | POA: Diagnosis not present

## 2024-04-03 DIAGNOSIS — E785 Hyperlipidemia, unspecified: Secondary | ICD-10-CM | POA: Diagnosis not present

## 2024-04-03 DIAGNOSIS — I700 Atherosclerosis of aorta: Secondary | ICD-10-CM | POA: Diagnosis not present

## 2024-04-03 DIAGNOSIS — M810 Age-related osteoporosis without current pathological fracture: Secondary | ICD-10-CM | POA: Diagnosis not present

## 2024-04-03 DIAGNOSIS — G4733 Obstructive sleep apnea (adult) (pediatric): Secondary | ICD-10-CM | POA: Diagnosis not present

## 2024-04-03 DIAGNOSIS — I2510 Atherosclerotic heart disease of native coronary artery without angina pectoris: Secondary | ICD-10-CM | POA: Diagnosis not present

## 2024-04-03 DIAGNOSIS — I129 Hypertensive chronic kidney disease with stage 1 through stage 4 chronic kidney disease, or unspecified chronic kidney disease: Secondary | ICD-10-CM | POA: Diagnosis not present

## 2024-04-03 DIAGNOSIS — E1122 Type 2 diabetes mellitus with diabetic chronic kidney disease: Secondary | ICD-10-CM | POA: Diagnosis not present

## 2024-04-03 DIAGNOSIS — K219 Gastro-esophageal reflux disease without esophagitis: Secondary | ICD-10-CM | POA: Diagnosis not present

## 2024-04-03 DIAGNOSIS — M1990 Unspecified osteoarthritis, unspecified site: Secondary | ICD-10-CM | POA: Diagnosis not present

## 2024-04-04 DIAGNOSIS — R2689 Other abnormalities of gait and mobility: Secondary | ICD-10-CM | POA: Diagnosis not present

## 2024-04-04 DIAGNOSIS — M6281 Muscle weakness (generalized): Secondary | ICD-10-CM | POA: Diagnosis not present

## 2024-04-05 DIAGNOSIS — G4733 Obstructive sleep apnea (adult) (pediatric): Secondary | ICD-10-CM | POA: Diagnosis not present

## 2024-04-08 ENCOUNTER — Ambulatory Visit (HOSPITAL_BASED_OUTPATIENT_CLINIC_OR_DEPARTMENT_OTHER): Admitting: Obstetrics & Gynecology

## 2024-04-09 ENCOUNTER — Encounter (HOSPITAL_BASED_OUTPATIENT_CLINIC_OR_DEPARTMENT_OTHER): Payer: Self-pay | Admitting: Obstetrics & Gynecology

## 2024-04-09 ENCOUNTER — Ambulatory Visit (HOSPITAL_BASED_OUTPATIENT_CLINIC_OR_DEPARTMENT_OTHER): Admitting: Obstetrics & Gynecology

## 2024-04-09 VITALS — BP 135/85 | HR 55 | Ht 64.0 in | Wt 159.0 lb

## 2024-04-09 DIAGNOSIS — B009 Herpesviral infection, unspecified: Secondary | ICD-10-CM

## 2024-04-09 DIAGNOSIS — M810 Age-related osteoporosis without current pathological fracture: Secondary | ICD-10-CM | POA: Diagnosis not present

## 2024-04-09 DIAGNOSIS — Z01419 Encounter for gynecological examination (general) (routine) without abnormal findings: Secondary | ICD-10-CM

## 2024-04-09 DIAGNOSIS — N390 Urinary tract infection, site not specified: Secondary | ICD-10-CM | POA: Diagnosis not present

## 2024-04-09 DIAGNOSIS — N3281 Overactive bladder: Secondary | ICD-10-CM | POA: Diagnosis not present

## 2024-04-09 DIAGNOSIS — Z9189 Other specified personal risk factors, not elsewhere classified: Secondary | ICD-10-CM

## 2024-04-09 MED ORDER — ESTRADIOL 0.1 MG/GM VA CREA: TOPICAL_CREAM | VAGINAL | 3 refills | Status: DC

## 2024-04-09 MED ORDER — VALACYCLOVIR HCL 500 MG PO TABS: 500.0000 mg | ORAL_TABLET | Freq: Every day | ORAL | 3 refills | Status: DC

## 2024-04-09 NOTE — Progress Notes (Signed)
 Breast and pelvic exam Patient name: [name] MRN [id number]  Date of birth: May 06, 1951 Chief Complaint:   Breast and pelvic exam  History of Present Illness:   [name] is a 73 y.o. [id number] Caucasian female being seen today for breast and pelvic exam.  Denies vaginal bleeding.    Patient's last menstrual period was 09/05/2003.   Last pap unknown. Results were: N/A. H/O abnormal pap: no Last mammogram: 07/22/2023. Results were: normal. Family h/o breast cancer: no Last colonoscopy: 09/26/2022. Results were: normal. Family h/o colorectal cancer: yes , Father     04/09/2024   11:22 AM 09/13/2023    9:41 AM 04/12/2023    9:36 AM 02/02/2022    2:58 PM  Depression screen PHQ 2/9  Decreased Interest 0 0 0 0  Down, Depressed, Hopeless 0 0 1 0  PHQ - 2 Score 0 0 1 0  Altered sleeping   3   Tired, decreased energy   3   Change in appetite   0   Feeling bad or failure about yourself    0   Trouble concentrating   0   Moving slowly or fidgety/restless   0   Suicidal thoughts   0   PHQ-9 Score   7     Review of Systems:   Pertinent items are noted in HPI Denies any pelvic pain.  Having some constipation with GLP1 use.  Decreased urinary urgency and incontinence.  Pertinent History Reviewed:  Reviewed past medical,surgical, social and family history.  Reviewed problem list, medications and allergies. Physical Assessment:   Vitals:   04/09/24 1119  BP: 135/85  Pulse: (!) 55  SpO2: 100%  Weight: 159 lb (72.1 kg)  Height: 5' 4 (1.626 m)  Body mass index is 27.29 kg/m.        Physical Examination:   General appearance - well appearing, and in no distress  Mental status - alert, oriented to person, place, and time  Psych:  She has a normal mood and affect  Skin - warm and dry, normal color, no suspicious lesions noted  Chest - effort normal, all lung fields clear to auscultation bilaterally  Heart - normal rate and regular rhythm  Neck:  midline trachea, no  thyromegaly or nodules  Breasts - breasts appear normal, no suspicious masses, no skin or nipple changes or  axillary nodes  Abdomen - soft, nontender, nondistended, no masses or organomegaly  Pelvic - VULVA: normal appearing vulva with no masses, tenderness or lesions   VAGINA: normal appearing vagina with normal color and discharge, no lesions   CERVIX: normal appearing cervix without discharge or lesions, no CMT  Thin prep pap is not indicated  UTERUS: uterus is felt to be normal size, shape, consistency and nontender   ADNEXA: No adnexal masses or tenderness noted.  Rectal - normal rectal, good sphincter tone, no masses felt  Extremities:  No swelling or varicosities noted  Chaperone present for exam  No results found for this or any previous visit (from the past 24 hours).  Assessment & Plan:  1. GYN exam for high-risk Medicare patient (Primary) - Pap smear neg 2024 - Mammogram 07/2023 - Colonoscopy 2024.  Dr. Kristie did not recommend follow up but pt thinks she is going to have another one - Bone mineral density done with Dr. Tommas - lab work done with PCP, Dr. 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 Increase to bid x 3 days with outbreaks.  Dispense: 30 tablet; Refill: 3  3. Recurrent UTI - estradiol  (ESTRACE ) 0.1 MG/GM vaginal cream; 1 gram vaginally twice weekly  Dispense: 42.5 g; Refill: 3  4. Overactive bladder - has seen urogyn.  On Gemtesa .    5. Age-related osteoporosis without current pathological fracture - on Prolia  and managed by Dr. Tommas  No orders of the defined types were placed in this encounter.   Meds:  Meds ordered this encounter  Medications   valACYclovir  (VALTREX ) 500 MG tablet    Sig: Take 1 tablet (500 mg total) by mouth daily. Increase to bid x 3 days with outbreaks.    Dispense:  30 tablet    Refill:  3    estradiol  (ESTRACE ) 0.1 MG/GM vaginal cream    Sig: 1 gram vaginally twice weekly    Dispense:  42.5 g    Refill:  3    Follow-up: Return in about 1 year (around 04/09/2025).  Ronal GORMAN Pinal, MD 04/09/2024 11:49 AM

## 2024-04-14 ENCOUNTER — Ambulatory Visit
Admission: RE | Admit: 2024-04-14 | Discharge: 2024-04-14 | Disposition: A | Discharge status: Home / Self Care | Source: Ambulatory Visit | Attending: Internal Medicine | Admitting: Internal Medicine

## 2024-04-14 DIAGNOSIS — R918 Other nonspecific abnormal finding of lung field: Secondary | ICD-10-CM | POA: Diagnosis not present

## 2024-04-14 DIAGNOSIS — R911 Solitary pulmonary nodule: Secondary | ICD-10-CM

## 2024-05-01 ENCOUNTER — Ambulatory Visit (HOSPITAL_BASED_OUTPATIENT_CLINIC_OR_DEPARTMENT_OTHER): Admitting: Obstetrics & Gynecology

## 2024-05-02 ENCOUNTER — Ambulatory Visit: Payer: Self-pay | Admitting: Internal Medicine

## 2024-05-06 DIAGNOSIS — G4733 Obstructive sleep apnea (adult) (pediatric): Secondary | ICD-10-CM | POA: Diagnosis not present

## 2024-05-06 NOTE — Telephone Encounter (Signed)
 Pt called office. I informed pt of Dr Saundra results for CT. Pt verbalized understanding. NFN

## 2024-05-06 NOTE — Telephone Encounter (Addendum)
 LVMTCB   ----- Message from [name] sent at 05/02/2024  2:37 PM EDT ----- CT chest- good report. Lung nodules of concern have resolved, so they were inflammatory and benign. Small remaining nodules are stable over time and considered benign. Your doctors will still want to  follow your distended aorta for progression. ----- Message ----- From: Interface, Rad Results In Sent: 04/26/2024   2:02 AM EDT To: Theresa JONETTA Salt, MD

## 2024-05-12 DIAGNOSIS — E663 Overweight: Secondary | ICD-10-CM | POA: Diagnosis not present

## 2024-05-12 DIAGNOSIS — Z8639 Personal history of other endocrine, nutritional and metabolic disease: Secondary | ICD-10-CM | POA: Diagnosis not present

## 2024-05-12 DIAGNOSIS — I10 Essential (primary) hypertension: Secondary | ICD-10-CM | POA: Diagnosis not present

## 2024-05-12 DIAGNOSIS — K5903 Drug induced constipation: Secondary | ICD-10-CM | POA: Diagnosis not present

## 2024-05-12 DIAGNOSIS — G4733 Obstructive sleep apnea (adult) (pediatric): Secondary | ICD-10-CM | POA: Diagnosis not present

## 2024-05-12 DIAGNOSIS — Z6825 Body mass index (BMI) 25.0-25.9, adult: Secondary | ICD-10-CM | POA: Diagnosis not present

## 2024-05-12 NOTE — Progress Notes (Unsigned)
 Patient ID: [name], female    DOB: 17-May-1951, 73 y.o.   MRN: [id number]  HPI  F never smoker folowed for asthma, allergic rhinitis, complicated by hx OSA/ oral appliance, HBP, GERD PFT 05/30/10- FEV1 2.56/ 91%, FEV1/FVC 0.73, DLCO 84%  within normal limits. NPSG-04/21/14- Moderate obstructive sleep apnea, AHI 21/ hr, CPAP to 14, weight 185 lbs HST 12/27/23- AHI 16.9/hr, desat to 77%, time with sat </= 88% was 56 minutes, body weight 160 lbs -----------------------------------------------------------------    02/05/24- 73 year old female never smoker followed for Asthma, Allergic Rhinitis, OSA, complicated by HBP, GERD, OA, CAD,  -Singulair , Trelegy100, albuterol  HFA, Lunesta  Body weight today 155 lbs HST 12/27/23- AHI 16.9/hr, desat to 77%, time with sat </= 88% was 56 minutes, body weight 160 lbs Reviewed CT result and HST. Can continue Lunesta . We will repeat CT in August, per radiology,  We discussed treatment options for OSA and she chooses to retry CPAP with new DME, auto 5-15. CT chest 12/24/23- MPRESSION: 1. Bilateral pulmonary nodules, the largest within the left upper lobe measuring 9 mm. These are indeterminate but possibly inflammatory in nature. Short-term follow-up imaging in 3 months is recommended to document stability or resolution. 2. Extensive multi-vessel coronary artery calcification. 3. Calcification of the aortic valve leaflets. Echocardiography may be helpful to assess the degree of valvular dysfunction. 4. 2.2 cm right thyroid  nodule. Recommend thyroid  US . 5. Fusiform dilation of the ascending aorta measuring 4.1 cm in greatest dimension. Recommend annual imaging followup by CTA or MRA. This recommendation follows 2010 ACCF/AHA/AATS/ACR/ASA/SCA/SCAI/SIR/STS/SVM Guidelines for the Diagnosis and Management of Patients with Thoracic Aortic Disease. Circulation. 2010; 121: Z733-z630. Aortic aneurysm NOS (ICD10-I71.9) 6. Enlarged central pulmonary arteries  in keeping with changes of pulmonary arterial hypertension.  05/15/24- 73 year old female never smoker followed for Asthma, Allergic Rhinitis, OSA, complicated by HBP, GERD, OA, CAD,  -Singulair , Trelegy100, albuterol  HFA, Lunesta  HST 12/27/23- AHI 16.9/hr, desat to 77%, time with sat </= 88% was 56 minutes, body weight 160 lbs CPAP auto 5-15/ [employer]   new, trying again 02/05/24 Download compliance-93%, AHI 2/hr Can continue Lunesta . We will repeat CT in August, per radiology, >> We discussed treatment options for OSA and she chooses to retry CPAP with new DME, auto 5-15. Discussed the use of AI scribe software for clinical note transcription with the patient, who gave verbal consent to proceed.  History of Present Illness   [name] is a 73 year old female who presents with  sleep apnea management.  She uses a CPAP machine for sleep apnea, achieving at least four hours of use per night, though she occasionally misses a night. The machine is set between five and fifteen centimeters of water  pressure, with most usage around eleven centimeters. She experiences about two breakthrough apneas per hour and notes improved success with this CPAP machine compared to a previous one. She is much more comfortable with CPAP this time around.  Her breathing feels fine, and a recent CT scan showed resolution of previously observed nodules, with a few tiny nodules remaining. She reports no current breathing difficulties and generally good sleep with the use of her CPAP machine. We discussed tracking with occasional/ annual CXR.     CT chest 04/14/24 MPRESSION: Previously seen upper lobe nodules have resolved in the interval consistent with postinflammatory change. Stable small subpleural nodules measuring 4 mm and less. No specific follow-up is recommended. 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 Recommend annual imaging followup by CTA or  MRA. This recommendation follows  2010 ACCF/AHA/AATS/ACR/ASA/SCA/SCAI/SIR/STS/SVM Guidelines for the Diagnosis and Management of Patients with Thoracic Aortic Disease. Circulation. 2010; 121: Z733-z630. Aortic aneurysm NOS (ICD10-I71.9)   Assessment and Plan:    Obstructive sleep apnea Obstructive sleep apnea well-managed with CPAP. Usage exceeds four hours per night, settings effective at 11 cm H2O, with acceptable apnea rate. - Continue CPAP therapy. - Schedule follow-up in six months with sleep specialist.  Aortic root dilatation Aortic root dilatation monitored, no immediate intervention needed. - PCP/ cardiology to monitor   Lung nodules - benign behavior     Review of Systems- see HPI  + = positive Constitutional:   +  weight loss- see HPI, night sweats, fevers, chills, +fatigue, lassitude. HEENT:   +  headaches, difficulty swallowing, tooth/dental problems, sore throat,       No-  sneezing, itching, ear ache, +nasal congestion, post nasal drip,  CV:  No-   chest pain, orthopnea, PND, swelling in lower extremities, anasarca,  dizziness, palpitations Resp: No-   shortness of breath with exertion or at rest.              No-   productive cough, +non-productive cough,  No- coughing up of blood.              No-   change in color of mucus. +wheezing.   Skin: No-   rash or lesions. GI:  +heartburn, indigestion, abdominal pain, nausea, vomiting, GU: . MS:  See HPI- + joint pain or swelling.   Neuro-     nothing unusual Psych:  No- change in mood or affect. No depression or anxiety.  No memory loss.  OBJ General- Alert, Oriented, Affect-appropriate, Distress- none acute.  + Overweight Skin- rash-none, lesions- none, excoriation- none Lymphadenopathy- none Head- atraumatic            Eyes- Gross vision intact, PERRLA, conjunctivae clear            Ears- Hearing, canals-normal            Nose- mucus, no-polyps, erosion, perforation             Throat- Mallampati III , mucosa clear , drainage- none, tonsils-  atrophic, own teeth Neck- flexible , trachea midline, no stridor , thyroid  nl, carotid no bruit Chest - symmetrical excursion , unlabored           Heart/CV- RRR , no murmur , no gallop  , no rub, nl s1 s2                           - JVD- none , edema- none, stasis changes- none, varices- none           Lung-  Wheeze-none, cough-none , dullness-none, rub- none           Chest wall-  Abd-  Br/ Gen/ Rectal- Not done, not indicated Extrem- cyanosis- none, clubbing, none, atrophy- none, strength- nl  +L hand in brace after falling Neuro- grossly intact to observation

## 2024-05-14 DIAGNOSIS — R197 Diarrhea, unspecified: Secondary | ICD-10-CM | POA: Diagnosis not present

## 2024-05-14 DIAGNOSIS — Z6826 Body mass index (BMI) 26.0-26.9, adult: Secondary | ICD-10-CM | POA: Diagnosis not present

## 2024-05-15 ENCOUNTER — Encounter: Payer: Self-pay | Admitting: Internal Medicine

## 2024-05-15 ENCOUNTER — Ambulatory Visit: Admitting: Internal Medicine

## 2024-05-15 VITALS — BP 108/66 | HR 54 | Temp 98.0°F | Ht 64.0 in | Wt 155.2 lb

## 2024-05-15 DIAGNOSIS — G4733 Obstructive sleep apnea (adult) (pediatric): Secondary | ICD-10-CM

## 2024-05-15 DIAGNOSIS — Z9989 Dependence on other enabling machines and devices: Secondary | ICD-10-CM | POA: Diagnosis not present

## 2024-05-15 DIAGNOSIS — I77819 Aortic ectasia, unspecified site: Secondary | ICD-10-CM

## 2024-05-15 DIAGNOSIS — R918 Other nonspecific abnormal finding of lung field: Secondary | ICD-10-CM | POA: Diagnosis not present

## 2024-05-15 NOTE — Patient Instructions (Signed)
 Good reports today- we can continue CPAP auto 5-15  For now, I think a chest xray once a year would be sufficient to track the little lung nodules, which are acting benign.  Please call if we can help.

## 2024-05-16 ENCOUNTER — Encounter: Payer: Self-pay | Admitting: Internal Medicine

## 2024-05-20 ENCOUNTER — Ambulatory Visit: Admitting: Podiatry

## 2024-05-20 ENCOUNTER — Encounter: Payer: Self-pay | Admitting: Podiatry

## 2024-05-20 DIAGNOSIS — B351 Tinea unguium: Secondary | ICD-10-CM

## 2024-05-20 DIAGNOSIS — M79676 Pain in unspecified toe(s): Secondary | ICD-10-CM | POA: Diagnosis not present

## 2024-05-25 NOTE — Progress Notes (Signed)
  Subjective:  Patient ID: [name], female    DOB: Nov 16, 1950,  MRN: [id number]  [name] presents to clinic today for: painful thick toenails that are difficult to trim. Pain interferes with ambulation. Aggravating factors include wearing enclosed shoe gear. Pain is relieved with periodic professional debridement.  Chief Complaint  Patient presents with   Nail Problem    RFC    PCP is Aisha Harvey, MD. [name] 05/14/2024.  Allergies  Allergen Reactions   Meloxicam Other (See Comments)    Blood count went down    Meperidine Nausea And Vomiting and Other (See Comments)   Meperidine Hcl Other (See Comments)   Nadolol Other (See Comments)    Low blood pressure   Prevacid [Lansoprazole] Other (See Comments)    Unknown reaction (patient does not recall)    Review of Systems: Negative except as noted in the HPI.  Objective: No changes noted in today's physical examination. There were no vitals filed for this visit.  [name] is a pleasant 73 y.o. female WD, WN in NAD. AAO x 3.  Vascular Examination: Capillary refill time <3 seconds b/l LE. Palpable pedal pulses b/l LE. Digital hair present b/l. No pedal edema b/l. Skin temperature gradient WNL b/l. No varicosities b/l. No cyanosis or clubbing. No ischemia or gangrene. .  Dermatological Examination: Pedal skin with normal turgor, texture and tone b/l. No open wounds. No interdigital macerations b/l. Toenails 1-5 b/l thickened, discolored, dystrophic with subungual debris. There is pain on palpation to dorsal aspect of nailplates. No corns, calluses, nor porokeratotic lesions.  Neurological Examination: Protective sensation intact with 10 gram monofilament b/l LE. Vibratory sensation intact b/l LE.   Musculoskeletal Examination: Muscle strength 5/5 to all lower extremity muscle groups bilaterally. HAV with bunion deformity noted b/l LE.SABRA No pain, crepitus or joint limitation noted with ROM b/l LE.  Patient  ambulates independently without assistive aids.  Assessment/Plan: 1. Pain due to onychomycosis of toenail   Patient was evaluated and treated. All patient's and/or POA's questions/concerns addressed on today's visit. Mycotic toenails 1-5 debrided in length and girth without incident. Continue soft, supportive shoe gear daily. Report any pedal injuries to medical professional. Call office if there are any quesitons/concerns. -Patient/POA to call should there be question/concern in the interim.   Return in about 3 months (around 08/19/2024).  [name], DPM      [place] LOCATION: [address], [place] [address]                   Office [phone]   [facility] LOCATION: [address] [place], [place] [address] Office [phone]

## 2024-05-26 ENCOUNTER — Other Ambulatory Visit (HOSPITAL_BASED_OUTPATIENT_CLINIC_OR_DEPARTMENT_OTHER): Payer: Self-pay

## 2024-05-26 MED ORDER — COMIRNATY 30 MCG/0.3ML IM SUSY
0.3000 mL | PREFILLED_SYRINGE | Freq: Once | INTRAMUSCULAR | 0 refills | Status: AC
  Filled 2024-05-26: qty 0.3, 1d supply, fill #0

## 2024-06-05 ENCOUNTER — Other Ambulatory Visit (HOSPITAL_BASED_OUTPATIENT_CLINIC_OR_DEPARTMENT_OTHER): Payer: Self-pay

## 2024-06-05 DIAGNOSIS — G4733 Obstructive sleep apnea (adult) (pediatric): Secondary | ICD-10-CM | POA: Diagnosis not present

## 2024-06-05 MED ORDER — FLUZONE HIGH-DOSE 0.5 ML IM SUSY
0.5000 mL | PREFILLED_SYRINGE | Freq: Once | INTRAMUSCULAR | 0 refills | Status: AC
Start: 2024-06-05 — End: 2024-06-06
  Filled 2024-06-05: qty 0.5, 1d supply, fill #0

## 2024-06-06 DIAGNOSIS — G4733 Obstructive sleep apnea (adult) (pediatric): Secondary | ICD-10-CM | POA: Diagnosis not present

## 2024-06-10 ENCOUNTER — Ambulatory Visit: Admitting: Obstetrics and Gynecology

## 2024-06-10 ENCOUNTER — Other Ambulatory Visit: Payer: Self-pay | Admitting: Obstetrics and Gynecology

## 2024-06-10 ENCOUNTER — Encounter: Payer: Self-pay | Admitting: Obstetrics and Gynecology

## 2024-06-10 VITALS — BP 125/86 | HR 58

## 2024-06-10 DIAGNOSIS — N3281 Overactive bladder: Secondary | ICD-10-CM

## 2024-06-10 DIAGNOSIS — R7303 Prediabetes: Secondary | ICD-10-CM | POA: Diagnosis not present

## 2024-06-10 DIAGNOSIS — Z800 Family history of malignant neoplasm of digestive organs: Secondary | ICD-10-CM | POA: Diagnosis not present

## 2024-06-10 DIAGNOSIS — I10 Essential (primary) hypertension: Secondary | ICD-10-CM | POA: Diagnosis not present

## 2024-06-10 DIAGNOSIS — Z48816 Encounter for surgical aftercare following surgery on the genitourinary system: Secondary | ICD-10-CM | POA: Diagnosis not present

## 2024-06-10 DIAGNOSIS — M810 Age-related osteoporosis without current pathological fracture: Secondary | ICD-10-CM | POA: Diagnosis not present

## 2024-06-10 DIAGNOSIS — E782 Mixed hyperlipidemia: Secondary | ICD-10-CM | POA: Diagnosis not present

## 2024-06-10 DIAGNOSIS — N390 Urinary tract infection, site not specified: Secondary | ICD-10-CM

## 2024-06-10 MED ORDER — ESTRADIOL 0.1 MG/GM VA CREA: TOPICAL_CREAM | VAGINAL | 11 refills | Status: AC

## 2024-06-10 MED ORDER — VIBEGRON 75 MG PO TABS: 1.0000 | ORAL_TABLET | Freq: Every day | ORAL | 11 refills | Status: AC

## 2024-06-10 NOTE — Progress Notes (Signed)
  Urogynecology Return Visit  SUBJECTIVE  History of Present Illness: KRISTYNE WOODRING is a 73 y.o. female seen in follow-up for OAB and recurrent UTI.   She is very happy with Gemtesa . Has very minimal leakage. Happens rarely on the way to the bathroom. Denies SUI symptoms. Still wears a liner just in case.   Has been using the estrogen cream, and has no UTI symptoms.   Requesting referral to GI for colonoscopy due to family history.   Past Medical History: Patient  has a past medical history of Acid reflux, Anemia, Arthritis, Asthma, Depression, Dysrhythmia, Heart murmur, Hiatal hernia, High cholesterol, Hypertension, Osteoporosis, Sleep apnea, STD (sexually transmitted disease), and Stye (06/2009).   Past Surgical History: She  has a past surgical history that includes Cesarean section; Breast reduction surgery; Nasal septum surgery; bilateral hip replacement (Bilateral, 6/07, 9/07); Total knee arthroplasty (03/18/2012); Fracture surgery; Total knee arthroplasty (Right, 07/23/2017); Nasal septoplasty w/ turbinoplasty (Bilateral, 07/19/2018); Reduction mammaplasty; and Acetabular revision (Right, 06/07/2022).   Medications: She has a current medication list which includes the following prescription(s): albuterol , amlodipine , amoxicillin , atorvastatin , b-complex with vitamin c, calcium  carb-cholecalciferol, cetirizine, cholecalciferol, clonidine , prolia , docusate sodium , esomeprazole , eszopiclone , fluticasone , trelegy ellipta , hydrochlorothiazide, magnesium , melatonin, methocarbamol , montelukast , ozempic (0.25 or 0.5 mg/dose), prenatal multivit-min-fe-fa, probiotic product, valacyclovir , estradiol , and vibegron .   Allergies: Patient is allergic to meloxicam, meperidine, meperidine hcl, nadolol, and prevacid [lansoprazole].   Social History: Patient  reports that she has never smoked. She has never used smokeless tobacco. She reports current alcohol  use. She reports that she does  not use drugs.     OBJECTIVE     Physical Exam: Vitals:   06/10/24 1146  BP: 125/86  Pulse: (!) 58   Gen: No apparent distress, A&O x 3.  Detailed Urogynecologic Evaluation:  Deferred.    ASSESSMENT AND PLAN    Ms. Gillin is a 73 y.o. with:  1. Overactive bladder   2. Recurrent UTI   3. Family history of colon cancer in father      Overactive bladder Assessment & Plan: - Continue Gemtesa   Orders: -     Vibegron ; Take 1 tablet (75 mg total) by mouth daily.  Dispense: 30 tablet; Refill: 11  Recurrent UTI Assessment & Plan: - Continue estradiol  cream twice a week  Orders: -     Estradiol ; 1 gram vaginally twice weekly  Dispense: 42.5 g; Refill: 11  Family history of colon cancer in father -     Ambulatory referral to Gastroenterology   Return 1 year or sooner if needed  Rosaline LOISE Caper, MD  Time spent: I spent 15 minutes dedicated to the care of this patient on the date of this encounter to include pre-visit review of records, face-to-face time with the patient and post visit documentation and ordering medication/ testing.

## 2024-06-10 NOTE — Assessment & Plan Note (Signed)
-   Continue estradiol  cream twice a week

## 2024-06-10 NOTE — Assessment & Plan Note (Signed)
 Continue Leslye Peer

## 2024-06-12 ENCOUNTER — Ambulatory Visit: Payer: Medicare PPO | Admitting: Dermatology

## 2024-06-12 ENCOUNTER — Encounter: Payer: Self-pay | Admitting: Dermatology

## 2024-06-12 VITALS — BP 143/79 | HR 58

## 2024-06-12 DIAGNOSIS — L814 Other melanin hyperpigmentation: Secondary | ICD-10-CM

## 2024-06-12 DIAGNOSIS — L821 Other seborrheic keratosis: Secondary | ICD-10-CM | POA: Diagnosis not present

## 2024-06-12 DIAGNOSIS — L578 Other skin changes due to chronic exposure to nonionizing radiation: Secondary | ICD-10-CM

## 2024-06-12 DIAGNOSIS — Z1283 Encounter for screening for malignant neoplasm of skin: Secondary | ICD-10-CM

## 2024-06-12 DIAGNOSIS — D1801 Hemangioma of skin and subcutaneous tissue: Secondary | ICD-10-CM

## 2024-06-12 DIAGNOSIS — D229 Melanocytic nevi, unspecified: Secondary | ICD-10-CM

## 2024-06-12 DIAGNOSIS — L905 Scar conditions and fibrosis of skin: Secondary | ICD-10-CM

## 2024-06-12 DIAGNOSIS — W908XXA Exposure to other nonionizing radiation, initial encounter: Secondary | ICD-10-CM | POA: Diagnosis not present

## 2024-06-12 NOTE — Progress Notes (Signed)
 Total Body Skin Exam (TBSE) Visit   Subjective  [name] is a 73 y.o. female who presents for the following: Skin Cancer Screening and Full Body Skin Exam  Patient presents today for follow up visit for TBSE. Patient was last evaluated on 06/12/23 . Patient denies medication changes. Patient reports she does not have spots, moles and lesions of concern to be evaluated. Patient reports throughout her lifetime she has had moderate sun exposure. Currently, patient reports if she has excessive sun exposure, she does apply sunscreen and/or wears protective coverings. Patient reports she has hx of bx. Patient admits to  family history of skin cancers, patient reports that her sister and father but unsure of what kind. The patient has spots, moles and lesions to be evaluated, some may be new or changing and the patient has concerns that these could be cancer.  On her last appointment a spot was removed on her left forearm the results came back abnormal and Dr. Alm stated it was fully removed and that we will just watch it  The following portions of the chart were reviewed this encounter and updated as appropriate: medications, allergies, medical history  Review of Systems:  No other skin or systemic complaints except as noted in HPI or Assessment and Plan.  Objective  Well appearing patient in no apparent distress; mood and affect are within normal limits.  A full examination was performed including scalp, head, eyes, ears, nose, lips, neck, chest, axillae, abdomen, back, buttocks, bilateral upper extremities, bilateral lower extremities, hands, feet, fingers, toes, fingernails, and toenails. All findings within normal limits unless otherwise noted below.   Relevant physical exam findings are noted in the Assessment and Plan.     Spot we are keeping an eye on    Assessment & Plan   LENTIGINES, SEBORRHEIC KERATOSES, HEMANGIOMAS - Benign normal skin lesions - Benign-appearing - Call  for any changes  MELANOCYTIC NEVI - Tan-brown and/or pink-flesh-colored symmetric macules and papules - Benign appearing on exam today - Observation - Call clinic for new or changing moles - Recommend daily use of broad spectrum spf 30+ sunscreen to sun-exposed areas.   ACTINIC DAMAGE - Chronic condition, secondary to cumulative UV/sun exposure - diffuse scaly erythematous macules with underlying dyspigmentation - Recommend daily broad spectrum sunscreen SPF 30+ to sun-exposed areas, reapply every 2 hours as needed.  - Staying in the shade or wearing long sleeves, sun glasses (UVA+UVB protection) and wide brim hats (4-inch brim around the entire circumference of the hat) are also recommended for sun protection.  - Call for new or changing lesions.  SKIN CANCER SCREENING PERFORMED TODAY.  HEMANGIOMA Exam: red papule(s) on lips Discussed benign nature. Recommend observation. Call for changes.   SCAR Exam: Dyspigmented smooth macule or patch on lips and right leg Benign-appearing.  Observation.  Call clinic for new or changing lesions. Recommend daily broad spectrum sunscreen SPF 30+, reapply every 2 hours as needed. Treatment: Recommend Serica moisturizing scar formula cream every night or [facility] brand or Mederma silicone scar sheet every night for the first year after a scar appears to help with scar remodeling if desired. Scars remodel on their own for a full year and will gradually improve in appearance over time.      Return in about 1 year (around 06/12/2025) for TBSE follow up.  I, Doyce Pan, CMA, am acting as scribe for [employer], DO.   Documentation: I have reviewed the above documentation for accuracy and completeness, and  I agree with the above.  Delon Lenis, DO

## 2024-06-12 NOTE — Patient Instructions (Signed)

## 2024-06-15 ENCOUNTER — Other Ambulatory Visit: Payer: Self-pay | Admitting: Internal Medicine

## 2024-06-17 DIAGNOSIS — Z6827 Body mass index (BMI) 27.0-27.9, adult: Secondary | ICD-10-CM | POA: Diagnosis not present

## 2024-06-17 DIAGNOSIS — M810 Age-related osteoporosis without current pathological fracture: Secondary | ICD-10-CM | POA: Diagnosis not present

## 2024-06-17 DIAGNOSIS — G479 Sleep disorder, unspecified: Secondary | ICD-10-CM | POA: Diagnosis not present

## 2024-06-17 DIAGNOSIS — E782 Mixed hyperlipidemia: Secondary | ICD-10-CM | POA: Diagnosis not present

## 2024-06-17 DIAGNOSIS — E871 Hypo-osmolality and hyponatremia: Secondary | ICD-10-CM | POA: Diagnosis not present

## 2024-06-17 DIAGNOSIS — G4733 Obstructive sleep apnea (adult) (pediatric): Secondary | ICD-10-CM | POA: Diagnosis not present

## 2024-06-17 DIAGNOSIS — K219 Gastro-esophageal reflux disease without esophagitis: Secondary | ICD-10-CM | POA: Diagnosis not present

## 2024-06-17 DIAGNOSIS — I10 Essential (primary) hypertension: Secondary | ICD-10-CM | POA: Diagnosis not present

## 2024-06-17 DIAGNOSIS — R7303 Prediabetes: Secondary | ICD-10-CM | POA: Diagnosis not present

## 2024-07-07 ENCOUNTER — Encounter: Payer: Self-pay | Admitting: Radiology

## 2024-07-09 DIAGNOSIS — Z0000 Encounter for general adult medical examination without abnormal findings: Secondary | ICD-10-CM | POA: Diagnosis not present

## 2024-07-09 DIAGNOSIS — R059 Cough, unspecified: Secondary | ICD-10-CM | POA: Diagnosis not present

## 2024-07-09 DIAGNOSIS — Z20822 Contact with and (suspected) exposure to covid-19: Secondary | ICD-10-CM | POA: Diagnosis not present

## 2024-07-09 DIAGNOSIS — Z1331 Encounter for screening for depression: Secondary | ICD-10-CM | POA: Diagnosis not present

## 2024-07-09 DIAGNOSIS — J029 Acute pharyngitis, unspecified: Secondary | ICD-10-CM | POA: Diagnosis not present

## 2024-07-11 NOTE — Progress Notes (Unsigned)
 Patient ID: [name], female    DOB: 1951/02/05, 73 y.o.   MRN: [id number]  HPI  F never smoker folowed for asthma, allergic rhinitis, complicated by hx OSA/ oral appliance, HBP, GERD PFT 05/30/10- FEV1 2.56/ 91%, FEV1/FVC 0.73, DLCO 84%  within normal limits. NPSG-04/21/14- Moderate obstructive sleep apnea, AHI 21/ hr, CPAP to 14, weight 185 lbs HST 12/27/23- AHI 16.9/hr, desat to 77%, time with sat </= 88% was 56 minutes, body weight 160 lbs -----------------------------------------------------------------   05/15/24- 73 year old female never smoker followed for Asthma, Allergic Rhinitis, OSA, complicated by HBP, GERD, OA, CAD,  -Singulair , Trelegy100, albuterol  HFA, Lunesta  HST 12/27/23- AHI 16.9/hr, desat to 77%, time with sat </= 88% was 56 minutes, body weight 160 lbs CPAP auto 5-15/ [employer]   new, trying again 02/05/24 Download compliance-93%, AHI 2/hr Can continue Lunesta . We will repeat CT in August, per radiology, >> We discussed treatment options for OSA and she chooses to retry CPAP with new DME, auto 5-15. Discussed the use of AI scribe software for clinical note transcription with the patient, who gave verbal consent to proceed.  History of Present Illness   [name] is a 73 year old female who presents with  sleep apnea management.  She uses a CPAP machine for sleep apnea, achieving at least four hours of use per night, though she occasionally misses a night. The machine is set between five and fifteen centimeters of water  pressure, with most usage around eleven centimeters. She experiences about two breakthrough apneas per hour and notes improved success with this CPAP machine compared to a previous one. She is much more comfortable with CPAP this time around.  Her breathing feels fine, and a recent CT scan showed resolution of previously observed nodules, with a few tiny nodules remaining. She reports no current breathing difficulties and generally good sleep with  the use of her CPAP machine. We discussed tracking with occasional/ annual CXR.     CT chest 04/14/24 MPRESSION: Previously seen upper lobe nodules have resolved in the interval consistent with postinflammatory change. Stable small subpleural nodules measuring 4 mm and less. No specific follow-up is recommended. 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 Recommend annual imaging followup by CTA or MRA. This recommendation follows 2010 ACCF/AHA/AATS/ACR/ASA/SCA/SCAI/SIR/STS/SVM Guidelines for the Diagnosis and Management of Patients with Thoracic Aortic Disease. Circulation. 2010; 121: Z733-z630. Aortic aneurysm NOS (ICD10-I71.9)   Assessment and Plan:    Obstructive sleep apnea Obstructive sleep apnea well-managed with CPAP. Usage exceeds four hours per night, settings effective at 11 cm H2O, with acceptable apnea rate. - Continue CPAP therapy. - Schedule follow-up in six months with sleep specialist.  Aortic root dilatation Aortic root dilatation monitored, no immediate intervention needed. - PCP/ cardiology to monitor  Lung nodules - benign behavior    07/15/24- 73 year old female never smoker followed for Asthma, Allergic Rhinitis, OSA, complicated by HBP, GERD, OA, CAD,  -Singulair , Trelegy100, albuterol  HFA, Lunesta  HST 12/27/23- AHI 16.9/hr, desat to 77%, time with sat </= 88% was 56 minutes, body weight 160 lbs CPAP auto 5-15/ [employer]   new, trying again 02/05/24 Download compliance-73%, AHI 2.9/hr ------Patient not able to wear CPAP recently, due to nasal congestion. Has a cold. Has still met compliance goals over past month- discussed. Had episode of acute bronchitis in October. Cleared with her augmentin  script and asks I refill that for her to have available. Appropriate use discussed. One week ago sore throat and acute sick again. Went to UC-  tested neg for flu and covid. Improving, with no fever or purulence. Uses rescue albuterol  hfa occasionally- will refill. Continues  Trelegy. Lunesta  helps insomnia- well tolerated. Up to date Flu and Covid vax.  RPatient not able to wear CPAP due to nasal congestion.eview of Systems- see HPI  + = positive Constitutional:   +  weight loss- see HPI, night sweats, fevers, chills, +fatigue, lassitude. HEENT:   +  headaches, difficulty swallowing, tooth/dental problems, sore throat,       No-  sneezing, itching, ear ache, +nasal congestion, post nasal drip,  CV:  No-   chest pain, orthopnea, PND, swelling in lower extremities, anasarca,  dizziness, palpitations Resp: No-   shortness of breath with exertion or at rest.              No-   productive cough, +non-productive cough,  No- coughing up of blood.              No-   change in color of mucus. +wheezing.   Skin: No-   rash or lesions. GI:  +heartburn, indigestion, abdominal pain, nausea, vomiting, GU: . MS:  See HPI- + joint pain or swelling.   Neuro-     nothing unusual Psych:  No- change in mood or affect. No depression or anxiety.  No memory loss.  OBJ General- Alert, Oriented, Affect-appropriate, Distress- none acute.    <Masked today> + Overweight Skin- rash-none, lesions- none, excoriation- none Lymphadenopathy- none Head- atraumatic            Eyes- Gross vision intact, PERRLA, conjunctivae clear            Ears- Hearing, canals-normal            Nose- mucus, no-polyps, erosion, perforation             Throat- Mallampati III , mucosa clear , drainage- none, tonsils- atrophic, own teeth Neck- flexible , trachea midline, no stridor , thyroid  nl, carotid no bruit Chest - symmetrical excursion , unlabored           Heart/CV- RRR , no murmur , no gallop  , no rub, nl s1 s2                           - JVD- none , edema- none, stasis changes- none, varices- none           Lung-  Wheeze-none, cough-none , dullness-none, rub- none           Chest wall-  Abd-  Br/ Gen/ Rectal- Not done, not indicated Extrem- cyanosis- none, clubbing, none, atrophy- none,  strength- nl  +L hand in brace after falling Neuro- grossly intact to observation

## 2024-07-15 ENCOUNTER — Encounter: Payer: Self-pay | Admitting: Internal Medicine

## 2024-07-15 ENCOUNTER — Ambulatory Visit: Admitting: Internal Medicine

## 2024-07-15 VITALS — BP 120/76 | HR 90 | Temp 97.8°F | Ht 64.0 in | Wt 160.6 lb

## 2024-07-15 DIAGNOSIS — G4733 Obstructive sleep apnea (adult) (pediatric): Secondary | ICD-10-CM | POA: Diagnosis not present

## 2024-07-15 DIAGNOSIS — J069 Acute upper respiratory infection, unspecified: Secondary | ICD-10-CM | POA: Diagnosis not present

## 2024-07-15 DIAGNOSIS — J4540 Moderate persistent asthma, uncomplicated: Secondary | ICD-10-CM

## 2024-07-15 MED ORDER — TRELEGY ELLIPTA 100-62.5-25 MCG/ACT IN AEPB
INHALATION_SPRAY | RESPIRATORY_TRACT | 11 refills | Status: AC
Start: 2024-07-15 — End: ?

## 2024-07-15 MED ORDER — MONTELUKAST SODIUM 10 MG PO TABS
ORAL_TABLET | ORAL | 3 refills | Status: AC
Start: 2024-07-15 — End: ?

## 2024-07-15 MED ORDER — AMOXICILLIN-POT CLAVULANATE 875-125 MG PO TABS: 1.0000 | ORAL_TABLET | Freq: Two times a day (BID) | ORAL | 4 refills | Status: AC

## 2024-07-15 MED ORDER — FLUTICASONE PROPIONATE 50 MCG/ACT NA SUSP: 2.0000 | Freq: Every evening | NASAL | 3 refills | Status: AC

## 2024-07-15 MED ORDER — ALBUTEROL SULFATE HFA 108 (90 BASE) MCG/ACT IN AERS: INHALATION_SPRAY | RESPIRATORY_TRACT | 11 refills | Status: AC

## 2024-07-15 NOTE — Patient Instructions (Signed)
 Scripts sent for augmentin  and your respiratory meds.  Get back to using your CPAP regularly as soon as you feel better.

## 2024-07-16 ENCOUNTER — Encounter: Payer: Self-pay | Admitting: Internal Medicine

## 2024-07-16 DIAGNOSIS — J069 Acute upper respiratory infection, unspecified: Secondary | ICD-10-CM | POA: Insufficient documentation

## 2024-07-16 NOTE — Assessment & Plan Note (Signed)
Benefits from CPAP with satisfactory compliance and control. Plan-continue auto 5-15

## 2024-07-16 NOTE — Assessment & Plan Note (Signed)
 Nonspecfic viral syndrome Expect continued resolution with symptomatic Rx as needed

## 2024-07-16 NOTE — Assessment & Plan Note (Signed)
 Trelegy and ventolin  refilled Rx augmentin  to hold

## 2024-07-29 DIAGNOSIS — R7303 Prediabetes: Secondary | ICD-10-CM | POA: Diagnosis not present

## 2024-07-29 DIAGNOSIS — I10 Essential (primary) hypertension: Secondary | ICD-10-CM | POA: Diagnosis not present

## 2024-07-29 DIAGNOSIS — K5903 Drug induced constipation: Secondary | ICD-10-CM | POA: Diagnosis not present

## 2024-07-29 DIAGNOSIS — Z6826 Body mass index (BMI) 26.0-26.9, adult: Secondary | ICD-10-CM | POA: Diagnosis not present

## 2024-07-29 DIAGNOSIS — E663 Overweight: Secondary | ICD-10-CM | POA: Diagnosis not present

## 2024-07-29 DIAGNOSIS — Z8639 Personal history of other endocrine, nutritional and metabolic disease: Secondary | ICD-10-CM | POA: Diagnosis not present

## 2024-07-29 DIAGNOSIS — G4733 Obstructive sleep apnea (adult) (pediatric): Secondary | ICD-10-CM | POA: Diagnosis not present

## 2024-07-31 ENCOUNTER — Other Ambulatory Visit (HOSPITAL_BASED_OUTPATIENT_CLINIC_OR_DEPARTMENT_OTHER): Payer: Self-pay | Admitting: Obstetrics & Gynecology

## 2024-07-31 DIAGNOSIS — B009 Herpesviral infection, unspecified: Secondary | ICD-10-CM

## 2024-08-11 ENCOUNTER — Other Ambulatory Visit: Payer: Self-pay | Admitting: Obstetrics & Gynecology

## 2024-08-11 DIAGNOSIS — Z1231 Encounter for screening mammogram for malignant neoplasm of breast: Secondary | ICD-10-CM

## 2024-08-12 ENCOUNTER — Ambulatory Visit

## 2024-08-20 ENCOUNTER — Ambulatory Visit: Admitting: Podiatry

## 2024-08-20 ENCOUNTER — Encounter: Payer: Self-pay | Admitting: Podiatry

## 2024-08-20 DIAGNOSIS — M79676 Pain in unspecified toe(s): Secondary | ICD-10-CM

## 2024-08-20 DIAGNOSIS — B351 Tinea unguium: Secondary | ICD-10-CM | POA: Diagnosis not present

## 2024-08-24 NOTE — Progress Notes (Signed)
"  °  Subjective:  Patient ID: [name], female    DOB: December 18, 1950,  MRN: [id number]  [name] presents to clinic today for painful mycotic toenails of both feet that are difficult to trim. Pain interferes with daily activities and wearing enclosed shoe gear comfortably.  Chief Complaint  Patient presents with   [facility]    RFC Not diabetic Theresa Harvey, MD (PCP), 05/12/24    New problem(s): None.   PCP is Theresa Harvey, MD.  Allergies[1]  Review of Systems: Negative except as noted in the HPI.  Objective: No changes noted in today's physical examination. There were no vitals filed for this visit. [name] is a pleasant 73 y.o. female WD, WN in NAD. AAO x 3.  Vascular Examination: Capillary refill time <3 seconds b/l LE. Palpable pedal pulses b/l LE. Digital hair present b/l. No pedal edema b/l. Skin temperature gradient WNL b/l. No varicosities b/l. No cyanosis or clubbing. No ischemia or gangrene. .  Dermatological Examination: Pedal skin with normal turgor, texture and tone b/l. No open wounds. No interdigital macerations b/l. Toenails 1-5 b/l thickened, discolored, dystrophic with subungual debris. There is pain on palpation to dorsal aspect of nailplates. No corns, calluses, nor porokeratotic lesions.  Neurological Examination: Protective sensation intact with 10 gram monofilament b/l LE. Vibratory sensation intact b/l LE.   Musculoskeletal Examination: Muscle strength 5/5 to all lower extremity muscle groups bilaterally. HAV with bunion deformity noted b/l LE.[name] No pain, crepitus or joint limitation noted with ROM b/l LE.  Patient ambulates independently without assistive aids.  Assessment/Plan: 1. Pain due to onychomycosis of toenail   Consent given for treatment. Patient examined. All patient's and/or POA's questions/concerns addressed on today's visit. Toenails 1-5 b/l debrided in length and girth without incident. Continue soft, supportive shoe gear daily.  Report any pedal injuries to medical professional. Call office if there are any questions/concerns. -Patient/POA to call should there be question/concern in the interim.   Return in about 3 months (around 11/18/2024).  [name], DPM      [place] LOCATION: [address] [place] [address]                   Office [phone]   [place] LOCATION: [address] [place], [place] [address] Office [phone]     [1]  Allergies Allergen Reactions   Meloxicam Other (See Comments)    Blood count went down    Meperidine Nausea And Vomiting and Other (See Comments)   Meperidine Hcl Other (See Comments)   Nadolol Other (See Comments)    Low blood pressure   Prevacid [Lansoprazole] Other (See Comments)    Unknown reaction (patient does not recall)   "

## 2024-09-10 ENCOUNTER — Ambulatory Visit
Admission: RE | Admit: 2024-09-10 | Discharge: 2024-09-10 | Disposition: A | Discharge status: Home / Self Care | Source: Ambulatory Visit | Attending: Obstetrics & Gynecology | Admitting: Obstetrics & Gynecology

## 2024-09-10 DIAGNOSIS — Z1231 Encounter for screening mammogram for malignant neoplasm of breast: Secondary | ICD-10-CM

## 2024-10-04 ENCOUNTER — Emergency Department (HOSPITAL_COMMUNITY)
Admission: EM | Admit: 2024-10-04 | Discharge: 2024-10-04 | Disposition: A | Discharge status: Home / Self Care | Attending: Emergency Medicine | Admitting: Emergency Medicine

## 2024-10-04 ENCOUNTER — Encounter (HOSPITAL_COMMUNITY)
Admission: EM | Disposition: A | Discharge status: Home / Self Care | Payer: Self-pay | Source: Home / Self Care | Attending: Emergency Medicine

## 2024-10-04 ENCOUNTER — Emergency Department (HOSPITAL_COMMUNITY)

## 2024-10-04 ENCOUNTER — Emergency Department (HOSPITAL_COMMUNITY): Admitting: Certified Registered"

## 2024-10-04 ENCOUNTER — Encounter (HOSPITAL_COMMUNITY): Payer: Self-pay | Admitting: Emergency Medicine

## 2024-10-04 ENCOUNTER — Other Ambulatory Visit: Payer: Self-pay

## 2024-10-04 DIAGNOSIS — I10 Essential (primary) hypertension: Secondary | ICD-10-CM | POA: Insufficient documentation

## 2024-10-04 DIAGNOSIS — J45909 Unspecified asthma, uncomplicated: Secondary | ICD-10-CM | POA: Insufficient documentation

## 2024-10-04 DIAGNOSIS — T84021A Dislocation of internal left hip prosthesis, initial encounter: Secondary | ICD-10-CM | POA: Insufficient documentation

## 2024-10-04 DIAGNOSIS — Y792 Prosthetic and other implants, materials and accessory orthopedic devices associated with adverse incidents: Secondary | ICD-10-CM | POA: Insufficient documentation

## 2024-10-04 DIAGNOSIS — G4730 Sleep apnea, unspecified: Secondary | ICD-10-CM | POA: Insufficient documentation

## 2024-10-04 DIAGNOSIS — S73005A Unspecified dislocation of left hip, initial encounter: Secondary | ICD-10-CM

## 2024-10-04 DIAGNOSIS — K219 Gastro-esophageal reflux disease without esophagitis: Secondary | ICD-10-CM | POA: Insufficient documentation

## 2024-10-04 DIAGNOSIS — X501XXA Overexertion from prolonged static or awkward postures, initial encounter: Secondary | ICD-10-CM | POA: Insufficient documentation

## 2024-10-04 LAB — CBC WITH DIFFERENTIAL/PLATELET
Abs Immature Granulocytes: 0.01 10*3/uL (ref 0.00–0.07)
Basophils Absolute: 0 10*3/uL (ref 0.0–0.1)
Basophils Relative: 1 %
Eosinophils Absolute: 0.1 10*3/uL (ref 0.0–0.5)
Eosinophils Relative: 2 %
HCT: 42 % (ref 36.0–46.0)
Hemoglobin: 13.1 g/dL (ref 12.0–15.0)
Immature Granulocytes: 0 %
Lymphocytes Relative: 21 %
Lymphs Abs: 1.2 10*3/uL (ref 0.7–4.0)
MCH: 27.4 pg (ref 26.0–34.0)
MCHC: 31.2 g/dL (ref 30.0–36.0)
MCV: 87.9 fL (ref 80.0–100.0)
Monocytes Absolute: 0.5 10*3/uL (ref 0.1–1.0)
Monocytes Relative: 9 %
Neutro Abs: 3.9 10*3/uL (ref 1.7–7.7)
Neutrophils Relative %: 67 %
Platelets: 202 10*3/uL (ref 150–400)
RBC: 4.78 MIL/uL (ref 3.87–5.11)
RDW: 13.4 % (ref 11.5–15.5)
WBC: 5.7 10*3/uL (ref 4.0–10.5)
nRBC: 0 % (ref 0.0–0.2)

## 2024-10-04 LAB — I-STAT CHEM 8, ED
BUN: 21 mg/dL (ref 8–23)
Calcium, Ion: 1.16 mmol/L (ref 1.15–1.40)
Chloride: 97 mmol/L — ABNORMAL LOW (ref 98–111)
Creatinine, Ser: 0.9 mg/dL (ref 0.44–1.00)
Glucose, Bld: 99 mg/dL (ref 70–99)
HCT: 40 % (ref 36.0–46.0)
Hemoglobin: 13.6 g/dL (ref 12.0–15.0)
Potassium: 4.1 mmol/L (ref 3.5–5.1)
Sodium: 136 mmol/L (ref 135–145)
TCO2: 28 mmol/L (ref 22–32)

## 2024-10-04 MED ORDER — DEXAMETHASONE SOD PHOSPHATE PF 10 MG/ML IJ SOLN
INTRAMUSCULAR | Status: AC
  Filled 2024-10-04: qty 1

## 2024-10-04 MED ORDER — LACTATED RINGERS IV SOLN: INTRAVENOUS | Status: DC | PRN

## 2024-10-04 MED ORDER — TRAMADOL HCL 50 MG PO TABS: 50.0000 mg | ORAL_TABLET | Freq: Three times a day (TID) | ORAL | 0 refills | Status: AC | PRN

## 2024-10-04 MED ORDER — PROPOFOL 10 MG/ML IV BOLUS
INTRAVENOUS | Status: DC | PRN
  Administered 2024-10-04: 120 mg via INTRAVENOUS
  Administered 2024-10-04: 20 mg via INTRAVENOUS

## 2024-10-04 MED ORDER — KETOROLAC TROMETHAMINE 30 MG/ML IJ SOLN
15.0000 mg | Freq: Once | INTRAMUSCULAR | Status: AC
  Administered 2024-10-04: 15 mg via INTRAVENOUS
  Filled 2024-10-04: qty 1

## 2024-10-04 MED ORDER — SUCCINYLCHOLINE CHLORIDE 200 MG/10ML IV SOSY
PREFILLED_SYRINGE | INTRAVENOUS | Status: DC | PRN
  Administered 2024-10-04: 100 mg via INTRAVENOUS

## 2024-10-04 MED ORDER — FENTANYL CITRATE (PF) 100 MCG/2ML IJ SOLN
INTRAMUSCULAR | Status: AC
  Filled 2024-10-04: qty 2

## 2024-10-04 MED ORDER — OXYCODONE HCL 5 MG/5ML PO SOLN: 5.0000 mg | Freq: Once | ORAL | Status: DC | PRN

## 2024-10-04 MED ORDER — FENTANYL CITRATE (PF) 50 MCG/ML IJ SOSY: 25.0000 ug | PREFILLED_SYRINGE | INTRAMUSCULAR | Status: DC | PRN

## 2024-10-04 MED ORDER — ONDANSETRON HCL 4 MG/2ML IJ SOLN
4.0000 mg | Freq: Once | INTRAMUSCULAR | Status: AC
  Administered 2024-10-04: 4 mg via INTRAVENOUS
  Filled 2024-10-04: qty 2

## 2024-10-04 MED ORDER — PROPOFOL 10 MG/ML IV BOLUS
INTRAVENOUS | Status: AC
  Filled 2024-10-04: qty 20

## 2024-10-04 MED ORDER — PROPOFOL 1000 MG/100ML IV EMUL
INTRAVENOUS | Status: AC
  Filled 2024-10-04: qty 100

## 2024-10-04 MED ORDER — CHLORHEXIDINE GLUCONATE CLOTH 2 % EX PADS
6.0000 | MEDICATED_PAD | Freq: Every day | CUTANEOUS | Status: DC
  Administered 2024-10-04: 6 via TOPICAL

## 2024-10-04 MED ORDER — OXYCODONE HCL 5 MG PO TABS: 5.0000 mg | ORAL_TABLET | Freq: Once | ORAL | Status: DC | PRN

## 2024-10-04 MED ORDER — METHOCARBAMOL 1000 MG/10ML IJ SOLN
1000.0000 mg | Freq: Once | INTRAMUSCULAR | Status: AC
  Administered 2024-10-04: 1000 mg via INTRAVENOUS
  Filled 2024-10-04: qty 10

## 2024-10-04 MED ORDER — ACETAMINOPHEN 10 MG/ML IV SOLN
1000.0000 mg | Freq: Four times a day (QID) | INTRAVENOUS | Status: DC
  Administered 2024-10-04: 1000 mg via INTRAVENOUS
  Filled 2024-10-04: qty 100

## 2024-10-04 MED ORDER — FENTANYL CITRATE (PF) 50 MCG/ML IJ SOSY
100.0000 ug | PREFILLED_SYRINGE | Freq: Once | INTRAMUSCULAR | Status: AC
  Administered 2024-10-04: 100 ug via INTRAVENOUS
  Filled 2024-10-04: qty 2

## 2024-10-04 MED ORDER — DEXAMETHASONE SOD PHOSPHATE PF 10 MG/ML IJ SOLN
INTRAMUSCULAR | Status: DC | PRN
  Administered 2024-10-04: 6 mg via INTRAVENOUS

## 2024-10-04 MED ORDER — ONDANSETRON HCL 4 MG/2ML IJ SOLN
INTRAMUSCULAR | Status: DC | PRN
  Administered 2024-10-04: 4 mg via INTRAVENOUS

## 2024-10-04 MED ORDER — ONDANSETRON HCL 4 MG/2ML IJ SOLN
INTRAMUSCULAR | Status: AC
  Filled 2024-10-04: qty 2

## 2024-10-04 MED ORDER — PROPOFOL 10 MG/ML IV BOLUS
0.5000 mg/kg | Freq: Once | INTRAVENOUS | Status: DC
  Filled 2024-10-04: qty 20

## 2024-10-04 MED ORDER — CHLORHEXIDINE GLUCONATE 0.12 % MT SOLN
15.0000 mL | Freq: Once | OROMUCOSAL | Status: AC
  Administered 2024-10-04: 15 mL via OROMUCOSAL

## 2024-10-04 NOTE — ED Provider Notes (Signed)
 " [place] [facility]ENC[facility] DEPARTMENT AT [facility] Provider Note   CSN: [id number] Arrival date & time: 10/04/24  9964     Patient presents with: Hip Pain   [name] is a 74 y.o. female.   The history is provided by the patient.  Hip Pain This is a recurrent problem. The current episode started 1 to 2 hours ago. The problem occurs constantly. The problem has not changed since onset.Pertinent negatives include no chest pain and no abdominal pain. Nothing aggravates the symptoms. Nothing relieves the symptoms. She has tried nothing for the symptoms. The treatment provided no relief.  Patient with B hip prosthesis with previous dislocation who felt a pop on the toilet.  Latst ingestion 90 minutes ago and also too Lunesta .       Prior to Admission medications  Medication Sig Start Date End Date Taking? Authorizing Provider  albuterol  (VENTOLIN  HFA) 108 (90 Base) MCG/ACT inhaler Inhale 2 puffs every 4-6 hours as needed 07/15/24   Neysa Rama D, MD  amLODipine  (NORVASC ) 2.5 MG tablet Take 7.5 mg by mouth daily. 02/04/24   [provider]  amoxicillin  (AMOXIL ) 500 MG capsule Take 500 mg by mouth. take FOUR capsules ONE hour BEFORE DENTAL procedure.    [provider]  amoxicillin -clavulanate (AUGMENTIN ) 875-125 MG tablet Take 1 tablet by mouth 2 (two) times daily. 07/15/24   Neysa Rama D, MD  atorvastatin  (LIPITOR) 40 MG tablet Take 40 mg by mouth at bedtime.    [provider]  B Complex-C (B-COMPLEX WITH VITAMIN C) tablet Take 1 tablet by mouth once a week.    [provider]  Calcium  Carb-Cholecalciferol (CALCIUM  + VITAMIN D3 PO) Take 1 tablet by mouth at bedtime.    [provider]  cetirizine (ZYRTEC) 10 MG tablet Take 10 mg by mouth daily as needed for allergies.     [provider]  Cholecalciferol (VITAMIN D3 PO) Take 1 capsule by mouth in the morning.    [provider]  cloNIDine  (CATAPRES ) 0.1 MG  tablet Take 0.1 mg by mouth every evening.    [provider]  denosumab  (PROLIA ) 60 MG/ML SOSY injection Inject 60 mg into the skin every 6 (six) months. 08/16/18   [provider]  docusate sodium  (COLACE) 100 MG capsule Take 100 mg by mouth at bedtime.    [provider]  esomeprazole  (NEXIUM ) 40 MG capsule Take 40 mg by mouth daily before breakfast.    [provider]  estradiol  (ESTRACE ) 0.1 MG/GM vaginal cream 1 gram vaginally twice weekly 06/10/24   Marilynne Rosaline SAILOR, MD  Eszopiclone  3 MG TABS Take 3 mg by mouth at bedtime. Take immediately before bedtime    [provider]  fluticasone  (FLONASE ) 50 MCG/ACT nasal spray Place 2 sprays into both nostrils at bedtime. 07/15/24   Neysa Rama BIRCH, MD  Fluticasone -Umeclidin-Vilant (TRELEGY ELLIPTA ) 100-62.5-25 MCG/ACT AEPB Inhale 1 puff then rinse mouth, once daily 07/15/24   Young, Clinton D, MD  hydrochlorothiazide (HYDRODIURIL) 12.5 MG tablet Take 12.5 mg by mouth every morning. 02/09/24   [provider]  Magnesium  500 MG TABS Take 500 mg by mouth at bedtime.    [provider]  melatonin 3 MG TABS tablet Take 3 mg by mouth at bedtime as needed (sleep).    [provider]  methocarbamol  (ROBAXIN ) 500 MG tablet Take 1 tablet (500 mg total) by mouth every 6 (six) hours as needed for muscle spasms. 06/08/22   [place],  [place], PA  montelukast  (SINGULAIR ) 10 MG tablet TAKE 1 TABLET BY MOUTH EVERY DAY 07/15/24   Young, Clinton D, MD  OZEMPIC, 0.25 OR 0.5 MG/DOSE, 2 MG/3ML SOPN Inject 2.5 mg as directed once a week.    [provider]  Prenatal Multivit-Min-Fe-FA (PRENATAL 1 + IRON  PO) Take 1 tablet by mouth in the morning.    [provider]  Probiotic Product (PROBIOTIC PO) Take 1 capsule by mouth every evening.    [provider]  valACYclovir  (VALTREX ) 500 MG tablet TAKE 1 TABLET (500 MG TOTAL) BY MOUTH DAILY. INCREASE TO TWICE A DAY X 3 DAYS WITH  OUTBREAKS. 08/04/24   Cleotilde Ronal RAMAN, MD  Vibegron  75 MG TABS Take 1 tablet (75 mg total) by mouth daily. 06/10/24   Marilynne Rosaline SAILOR, MD    Allergies: Meloxicam, Meperidine, Meperidine hcl, Nadolol, and Prevacid [lansoprazole]    Review of Systems  Constitutional:  Negative for fever.  Cardiovascular:  Negative for chest pain.  Gastrointestinal:  Negative for abdominal pain.  All other systems reviewed and are negative.   Updated Vital Signs BP 118/78   Pulse (!) 55   Temp 98.7 F (37.1 C)   Resp 16   Ht 5' 4 (1.626 m)   Wt 72.6 kg   LMP 09/05/2003   SpO2 100%   BMI 27.46 kg/m   Physical Exam Vitals and nursing note reviewed.  Constitutional:      General: She is not in acute distress.    Appearance: Normal appearance. She is well-developed.  HENT:     Head: Normocephalic and atraumatic.     Nose: Nose normal.  Eyes:     Pupils: Pupils are equal, round, and reactive to light.  Cardiovascular:     Rate and Rhythm: Normal rate and regular rhythm.     Pulses: Normal pulses.     Heart sounds: Normal heart sounds.  Pulmonary:     Effort: Pulmonary effort is normal. No respiratory distress.     Breath sounds: Normal breath sounds.  Abdominal:     General: Bowel sounds are normal. There is no distension.     Palpations: Abdomen is soft.     Tenderness: There is no abdominal tenderness. There is no guarding or rebound.  Genitourinary:    Vagina: Vaginal discharge present.  Musculoskeletal:        General: Deformity present. Normal range of motion.     Cervical back: Neck supple.  Skin:    General: Skin is dry.     Capillary Refill: Capillary refill takes less than 2 seconds.     Findings: No erythema or rash.  Neurological:     General: No focal deficit present.     Mental Status: She is alert.     Deep Tendon Reflexes: Reflexes normal.  Psychiatric:        Mood and Affect: Mood normal.     (all labs ordered are listed, but only abnormal results are  displayed) Labs Reviewed  I-STAT CHEM 8, ED - Abnormal; Notable for the following components:      Result Value   Chloride 97 (*)    All other components within normal limits  CBC WITH DIFFERENTIAL/PLATELET    EKG: None  Radiology: No results found.   .Reduction of dislocation  Date/Time: 10/04/2024 6:42 AM  Performed by: Nettie Earing, MD Authorized by: Nettie Earing, MD  Consent: Verbal consent obtained Imaging studies: imaging studies available Patient identity confirmed: arm band and anonymous protocol,  patient vented/unresponsive Preparation: Patient was prepped and draped in the usual sterile fashion. Local anesthesia used: no  Anesthesia: Local anesthesia used: no  Sedation: Patient sedated: no  Patient tolerance: patient tolerated the procedure well with no immediate complications Comments: Unable to rduce                                       Medical Decision Making Patient with L hip dislocation   Amount and/or Complexity of Data Reviewed Independent Historian: EMS    Details: See above  External Data Reviewed: notes.    Details: Previous notes reviewed  Labs: ordered. Radiology: ordered and independent interpretation performed.    Details: Dislocation of left hip  Discussion of management or test interpretation with external provider(s): 2:30 AM case d/w Dr. Burnetta, attempt without sedation.  Will need admission.  Call back if unsuccessful 4:07 AM please hold in ED will take to the OR    Risk Prescription drug management. Decision regarding hospitalization. Risk Details: Attempted reduction unsuccessful half life of drug is 6-8 hours.  Awaiting OR     Final diagnoses:  None   The patient appears reasonably stabilized for admission considering the current resources, flow, and capabilities available in the ED at this time, and I doubt any other [facility] requiring further screening and/or treatment in the ED prior to admission.  ED Discharge  Orders     None          Ronrico Dupin, MD 10/04/24 [phone]  "

## 2024-10-04 NOTE — H&P (Signed)
 "     Chief Complaint: Left prosthetic hip dislocation  History:  This is a recurrent problem. The current episode started at approximately 11 PM yesterday. The problem occurs constantly. The problem has not changed since onset.Pertinent negatives include no chest pain and no abdominal pain. Nothing aggravates the symptoms. Nothing relieves the symptoms. She has tried nothing for the symptoms. The treatment provided no relief.  Patient with L hip prosthesis with previous dislocation who felt a pop on the toilet.     Review of systems: No loss of consciousness, no nausea, vomiting, blurry vision, patient is alert oriented x 3.  Past Medical History:  Diagnosis Date   Acid reflux    Anemia    during the time I was taking meloxicam   Arthritis    Asthma    LOV  6/13  Dr Neysa EPIC/ clearance on chart  from 10/12   Depression    Dysrhythmia    PVC's with caffeine and anxiety   Heart murmur    since childhood   Hiatal hernia    High cholesterol    Hypertension    OV with clearance and note Dr Aisha 6/13 chart   Osteoporosis    Sleep apnea    no CPAP/ last study 9 yrs ago- mild per patient   STD (sexually transmitted disease)    HSV2   Stye 06/2009   eye    Allergies[1]  Medications Ordered Prior to Encounter[2]  Physical Exam: Vitals:   10/04/24 0400 10/04/24 0632  BP: 127/74 120/71  Pulse: (!) 55 (!) 55  Resp: 17 13  Temp: 97.8 F (36.6 C) 97.9 F (36.6 C)  SpO2: 99% 93%   Body mass index is 27.46 kg/m. Vitals and nursing note reviewed.  Constitutional:      General: She is not in acute distress.    Appearance: Normal appearance. She is well-developed.  HENT:     Head: Normocephalic and atraumatic.     Nose: Nose normal.  Eyes:     Pupils: Pupils are equal, round, and reactive to light.  Cardiovascular:     Rate and Rhythm: Normal rate and regular rhythm.     Pulses: Normal pulses.     Heart sounds: Normal heart sounds.  Pulmonary:     Effort:  Pulmonary effort is normal. No respiratory distress.     Breath sounds: Normal breath sounds.  Abdominal:     General: Bowel sounds are normal. There is no distension.     Palpations: Abdomen is soft.     Tenderness: There is no abdominal tenderness. There is no guarding or rebound.  Musculoskeletal:        General: Deformity present of the left hip.  The extremity is shortened and internally rotated.  Patient has normal range of motion of the knee and ankle.  No other deformity or pain is noted in the extremities.   Cervical back: Neck supple.  Skin:    General: Skin is dry.     Capillary Refill: Capillary refill takes less than 2 seconds.     Findings: No erythema or rash.  Neurological:     General: No focal deficit present.     Mental Status: She is alert.     Deep Tendon Reflexes: Reflexes normal.  Psychiatric:        Mood and Affect: Mood normal.     Image: DG Hip Unilat W or Wo Pelvis 2-3 Views Left Result Date: 10/04/2024 EXAM: 2-3 VIEW(S) XRAY OF  THE BILATERAL HIPS 10/04/2024 01:17:46 AM COMPARISON: None available. CLINICAL HISTORY: Hip pain. FINDINGS: BONES AND JOINTS: Prosthetic femoral head dislocated superiorly and posteriorly. Right hip prosthesis normally located. SOFT TISSUES: Unremarkable. IMPRESSION: 1. Prosthetic femoral head dislocated superiorly and posteriorly. 2. Right hip prosthesis is in expected position. Electronically signed by: Greig Pique MD 10/04/2024 03:17 AM EST RP Workstation: [id number]   MM 3D SCREENING MAMMOGRAM BILATERAL BREAST Result Date: 09/12/2024 CLINICAL DATA:  Screening. EXAM: DIGITAL SCREENING BILATERAL MAMMOGRAM WITH TOMOSYNTHESIS AND CAD TECHNIQUE: Bilateral screening digital craniocaudal and mediolateral oblique mammograms were obtained. Bilateral screening digital breast tomosynthesis was performed. The images were evaluated with computer-aided detection. COMPARISON:  Previous exam(s). ACR Breast Density Category a: The breasts are almost  entirely fatty. FINDINGS: There are no findings suspicious for malignancy. IMPRESSION: No mammographic evidence of malignancy. A result letter of this screening mammogram will be mailed directly to the patient. RECOMMENDATION: Screening mammogram in one year. (Code:[id number]-B-01Y) BI-RADS CATEGORY  1: Negative. Electronically Signed   By: Alm Parkins M.D.   On: 09/12/2024 07:50    A/P: [name] is a very pleasant 74 year old woman who has had previous left hip dislocations who presents after postural indiscretion while on the commode.  The patient had immediate deformity of the left lower extremity and was brought to the emergency room.  Attempts to close reduction in the ER were unsuccessful.  As result we elected bring her to the operating room for deeper sedation for closed reduction.  Risks, benefits, alternatives were discussed with the patient.  These include infection, fracture, inability to reduce the hip, ongoing or worse pain, need for open reduction/revision of her hip prosthesis.  The patient has expressed an understanding of these risks and willingness to move forward with surgery.  We will plan on taking her to the operating room today for close reduction.  If successful she will be placed in a knee immobilizer and may be discharged later on today.     [1]  Allergies Allergen Reactions   Meloxicam Other (See Comments)    Blood count went down    Meperidine Nausea And Vomiting and Other (See Comments)   Meperidine Hcl Other (See Comments)   Nadolol Other (See Comments)    Low blood pressure   Prevacid [Lansoprazole] Other (See Comments)    Unknown reaction (patient does not recall)  [2]  No current facility-administered medications on file prior to encounter.   Current Outpatient Medications on File Prior to Encounter  Medication Sig Dispense Refill   albuterol  (VENTOLIN  HFA) 108 (90 Base) MCG/ACT inhaler Inhale 2 puffs every 4-6 hours as needed 18 g 11   amLODipine  (NORVASC ) 2.5 MG  tablet Take 7.5 mg by mouth daily.     amoxicillin  (AMOXIL ) 500 MG capsule Take 500 mg by mouth. take FOUR capsules ONE hour BEFORE DENTAL procedure.     amoxicillin -clavulanate (AUGMENTIN ) 875-125 MG tablet Take 1 tablet by mouth 2 (two) times daily. 14 tablet 4   atorvastatin  (LIPITOR) 40 MG tablet Take 40 mg by mouth at bedtime.     B Complex-C (B-COMPLEX WITH VITAMIN C) tablet Take 1 tablet by mouth once a week.     Calcium  Carb-Cholecalciferol (CALCIUM  + VITAMIN D3 PO) Take 1 tablet by mouth at bedtime.     cetirizine (ZYRTEC) 10 MG tablet Take 10 mg by mouth daily as needed for allergies.      Cholecalciferol (VITAMIN D3 PO) Take 1 capsule by mouth in the morning.     cloNIDine  (CATAPRES )  0.1 MG tablet Take 0.1 mg by mouth every evening.     denosumab  (PROLIA ) 60 MG/ML SOSY injection Inject 60 mg into the skin every 6 (six) months.     docusate sodium  (COLACE) 100 MG capsule Take 100 mg by mouth at bedtime.     esomeprazole  (NEXIUM ) 40 MG capsule Take 40 mg by mouth daily before breakfast.     estradiol  (ESTRACE ) 0.1 MG/GM vaginal cream 1 gram vaginally twice weekly 42.5 g 11   Eszopiclone  3 MG TABS Take 3 mg by mouth at bedtime. Take immediately before bedtime     fluticasone  (FLONASE ) 50 MCG/ACT nasal spray Place 2 sprays into both nostrils at bedtime. 48 mL 3   Fluticasone -Umeclidin-Vilant (TRELEGY ELLIPTA ) 100-62.5-25 MCG/ACT AEPB Inhale 1 puff then rinse mouth, once daily 60 each 11   hydrochlorothiazide (HYDRODIURIL) 12.5 MG tablet Take 12.5 mg by mouth every morning.     Magnesium  500 MG TABS Take 500 mg by mouth at bedtime.     melatonin 3 MG TABS tablet Take 3 mg by mouth at bedtime as needed (sleep).     methocarbamol  (ROBAXIN ) 500 MG tablet Take 1 tablet (500 mg total) by mouth every 6 (six) hours as needed for muscle spasms. 40 tablet 0   montelukast  (SINGULAIR ) 10 MG tablet TAKE 1 TABLET BY MOUTH EVERY DAY 90 tablet 3   OZEMPIC, 0.25 OR 0.5 MG/DOSE, 2 MG/3ML SOPN Inject 2.5 mg  as directed once a week.     Prenatal Multivit-Min-Fe-FA (PRENATAL 1 + IRON  PO) Take 1 tablet by mouth in the morning.     Probiotic Product (PROBIOTIC PO) Take 1 capsule by mouth every evening.     valACYclovir  (VALTREX ) 500 MG tablet TAKE 1 TABLET (500 MG TOTAL) BY MOUTH DAILY. INCREASE TO TWICE A DAY X 3 DAYS WITH OUTBREAKS. 30 tablet 3   Vibegron  75 MG TABS Take 1 tablet (75 mg total) by mouth daily. 30 tablet 11   "

## 2024-10-04 NOTE — Brief Op Note (Signed)
 05/08/2024   10:23 AM   PATIENT:  10/04/2024  7:28 AM  PATIENT:  Inocente JONELLE Banter  74 y.o. female  PRE-OPERATIVE DIAGNOSIS:  left hip dislocation  POST-OPERATIVE DIAGNOSIS:  left hip dislocation  PROCEDURE:  Procedures: CLOSED REDUCTION, HIP (Left)  SURGEON:  Surgeons and Role:    DEWAINE Burnetta Aures, MD - Primary  ANESTHESIA:   IV sedation   BLOOD ADMINISTERED:none  DRAINS: none   LOCAL MEDICATIONS USED:  NONE  SPECIMEN:  No Specimen  DISPOSITION OF SPECIMEN:  N/A  COUNTS:  YES  TOURNIQUET:  * No tourniquets in log *  DICTATION: .Dragon Dictation  PLAN OF CARE: Discharge to home after PACU  PATIENT DISPOSITION:  PACU - hemodynamically stable.

## 2024-10-04 NOTE — Op Note (Signed)
 OPERATIVE REPORT  DATE OF SURGERY: 10/04/2024  PATIENT NAME:  [name] MRN: [id number] DOB: 1950-11-24  PCP: Aisha Harvey, MD  PRE-OPERATIVE DIAGNOSIS: Left prosthetic hip dislocation  POST-OPERATIVE DIAGNOSIS: Same  PROCEDURE:   Closed reduction of left prosthetic hip dislocation  SURGEON:  Donaciano Sprang, MD  PHYSICIAN ASSISTANT: None  ANESTHESIA:   Local MAC  EBL: None   Complications: None  BRIEF HISTORY: [name] is a 74 y.o. female who unfortunately had a postural indiscretion and suffered a left prosthetic hip dislocation.  Attempts to close reduction in the emergency room were unsuccessful.  As a result the decision was made to take her to the operating room for deeper sedation to allow for close reduction.  Risks, benefits, alternatives were discussed with the patient and consent was obtained.  PROCEDURE DETAILS: Patient was brought into the operating room on a stretcher.  A timeout was performed to confirm patient procedure and all other important data.  Once the timeout was confirmed and completed IV MAC anesthesia was provided.  Once the patient was sedated and relaxed a gentle closed reduction maneuver was performed with an audible reduction.  At this point the extremity was visibly reduced and the hip was able to be ranged without instability or difficulty.  AP and lateral x-rays were taken confirming the reduction.  A knee immobilizer was then applied and the patient was awoken and transferred back to the PACU without incident.   Donaciano Sprang, MD 10/04/2024 7:24 AM

## 2024-10-04 NOTE — Anesthesia Procedure Notes (Signed)
 Date/Time: 10/04/2024 7:15 AM  Performed by: Para Jerelene CROME, CRNAOxygen Delivery Method: Simple face mask Comments: Patient preoxygenated with face mask. Assist ventilation as needed.

## 2024-10-04 NOTE — Anesthesia Preprocedure Evaluation (Signed)
 "                                  Anesthesia Evaluation  Patient identified by MRN, date of birth, ID band Patient awake    Reviewed: Allergy & Precautions, NPO status , Patient's Chart, lab work & pertinent test results  History of Anesthesia Complications Negative for: history of anesthetic complications  Airway Mallampati: II  TM Distance: >3 FB Neck ROM: Full    Dental  (+) Teeth Intact, Dental Advisory Given   Pulmonary neg shortness of breath, asthma , sleep apnea , neg recent URI   breath sounds clear to auscultation       Cardiovascular hypertension, Pt. on medications (-) angina (-) Past MI and (-) CHF  Rhythm:Regular     Neuro/Psych neg Seizures PSYCHIATRIC DISORDERS  Depression     Neuromuscular disease    GI/Hepatic Neg liver ROS, hiatal hernia,GERD  Controlled and Medicated,,  Endo/Other  negative endocrine ROS    Renal/GU Lab Results      Component                Value               Date                      NA                       136                 10/04/2024                K                        4.1                 10/04/2024                CO2                      27                  06/08/2022                GLUCOSE                  99                  10/04/2024                BUN                      21                  10/04/2024                CREATININE               0.90                10/04/2024                CALCIUM                   8.9  06/08/2022                GFRNONAA                 >60                 06/08/2022                Musculoskeletal  (+) Arthritis ,  left hip dislocation   Abdominal   Peds  Hematology negative hematology ROS (+) Lab Results      Component                Value               Date                      WBC                      5.7                 10/04/2024                HGB                      13.1                10/04/2024                HCT                      42.0                 10/04/2024                MCV                      87.9                10/04/2024                PLT                      202                 10/04/2024              Anesthesia Other Findings   Reproductive/Obstetrics                              Anesthesia Physical Anesthesia Plan  ASA: 2  Anesthesia Plan: General   Post-op Pain Management: Minimal or no pain anticipated   Induction: Intravenous  PONV Risk Score and Plan: 3 and Ondansetron   Airway Management Planned: Mask, Nasal Cannula and Natural Airway  Additional Equipment: None  Intra-op Plan:   Post-operative Plan:   Informed Consent: I have reviewed the patients History and Physical, chart, labs and discussed the procedure including the risks, benefits and alternatives for the proposed anesthesia with the patient or authorized representative who has indicated his/her understanding and acceptance.     Dental advisory given  Plan Discussed with: CRNA  Anesthesia Plan Comments:          Anesthesia Quick Evaluation  "

## 2024-10-04 NOTE — ED Notes (Signed)
 Pt to go to PACU at [facility]

## 2024-10-04 NOTE — ED Notes (Signed)
 10/04/24 0800 Opened chart to find med necessity form to fill out for [facility] to transport pt home.

## 2024-10-04 NOTE — Anesthesia Postprocedure Evaluation (Signed)
"   Anesthesia Post Note  Patient: [name]  Procedure(s) Performed: CLOSED REDUCTION, HIP (Left: Hip)     Patient location during evaluation: PACU Anesthesia Type: General Level of consciousness: awake and alert Pain management: pain level controlled Vital Signs Assessment: post-procedure vital signs reviewed and stable Respiratory status: spontaneous breathing, nonlabored ventilation and respiratory function stable Cardiovascular status: blood pressure returned to baseline and stable Postop Assessment: no apparent nausea or vomiting Anesthetic complications: no   No notable events documented.  Last Vitals:  Vitals:   10/04/24 0800 10/04/24 0827  BP: 131/77   Pulse: (!) 55 (!) 53  Resp: 16 16  Temp: 36.4 C   SpO2: 96% 98%    Last Pain:  Vitals:   10/04/24 0800  TempSrc:   PainSc: 0-No pain                 Maila Dukes      "

## 2024-10-04 NOTE — Transfer of Care (Signed)
 Immediate Anesthesia Transfer of Care Note  Patient: [name]  Procedure(s) Performed: CLOSED REDUCTION, HIP (Left: Hip)  Patient Location: PACU  Anesthesia Type:General  Level of Consciousness: awake and patient cooperative  Airway & Oxygen  Therapy: Patient Spontanous Breathing and Patient connected to nasal cannula oxygen   Post-op Assessment: Report given to RN and Post -op Vital signs reviewed and stable  Post vital signs: Reviewed and stable  Last Vitals:  Vitals Value Taken Time  BP 116/76 10/04/24 07:45  Temp 36.5 C 10/04/24 07:35  Pulse 56 10/04/24 07:47  Resp 16 10/04/24 07:47  SpO2 94 % 10/04/24 07:47  Vitals shown include unfiled device data.  Last Pain:  Vitals:   10/04/24 0735  TempSrc:   PainSc: 0-No pain         Complications: No notable events documented.

## 2024-10-04 NOTE — ED Triage Notes (Signed)
 Patient BIB EMS from home c/o hip pain. Patient report she felt a pop while trying to sit in the toilet. 100mcg fentanyl  given by EMS. Patient denies any fall and injury. Hx of hip dislocation.  BP 112/70 HR 64 RR 20 O2sa5 99% on RA

## 2024-10-04 NOTE — Evaluation (Signed)
 Physical Therapy Evaluation Patient Details Name: [name] MRN: [id number] DOB: 1950-10-25 Today's Date: 10/04/2024  History of Present Illness  Pt s/p L THR discocation and now s/p reduction under anaesthetic.  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  Clinical Impression  Pt admitted as above and currently demonstrating all mobility tasks at sup level with min cues for safety and THP follow through.  Pt exited bed and up to ambulate ~100' with RW and good stability.  Pt educated on posterior THR with written instruction provided, on need to use RW until seeing Dr Hiram next week, and on need for assist on LB dressing at this time.  Pt eager for return home this date.      If plan is discharge home, recommend the following: Assistance with cooking/housework;Assist for transportation;A little help with bathing/dressing/bathroom   Can travel by private vehicle        Equipment Recommendations Other (comment) (May need RW, spouse is checking on availability)  Recommendations for Other Services       Functional Status Assessment Patient has had a recent decline in their functional status and demonstrates the ability to make significant improvements in function in a reasonable and predictable amount of time.     Precautions / Restrictions Precautions Precautions: Fall;Posterior Hip Precaution Booklet Issued: Yes (comment) Required Braces or Orthoses: Knee Immobilizer - Left Knee Immobilizer - Left: On at all times Restrictions Weight Bearing Restrictions Per Provider Order: No      Mobility  Bed Mobility Overal bed mobility: Needs Assistance Bed Mobility: Supine to Sit     Supine to sit: Supervision     General bed mobility comments: min cues for use of R LE to self assist.  Good adherence to THP with KI in place    Transfers Overall transfer level: Needs assistance Equipment used: Rolling walker (2  wheels) Transfers: Sit to/from Stand Sit to Stand: Contact guard assist, Supervision           General transfer comment: min cues for LE management, use of UEs to self assist;  Good adherence to THP    Ambulation/Gait Ambulation/Gait assistance: Contact guard assist, Supervision Gait Distance (Feet): 100 Feet Assistive device: Rolling walker (2 wheels) Gait Pattern/deviations: Step-to pattern, Step-through pattern, Decreased step length - right, Decreased step length - left, Shuffle, Trunk flexed Gait velocity: decr     General Gait Details: min cues for posture, position from RW and adherence to THP on turns  Stairs Stairs:  (pt declines - states going home by [employer])          Wheelchair Mobility     Tilt Bed    Modified Rankin (Stroke Patients Only)       Balance Overall balance assessment: Needs assistance Sitting-balance support: No upper extremity supported, Feet supported Sitting balance-Leahy Scale: Good     Standing balance support: Bilateral upper extremity supported Standing balance-Leahy Scale: Fair                               Pertinent Vitals/Pain Pain Assessment Pain Assessment: No/denies pain    Home Living Family/patient expects to be discharged to:: Private residence Living Arrangements: Spouse/significant other Available Help at Discharge: Available 24 hours/day Type of Home: House Home Access: Stairs to enter Entrance Stairs-Rails: Right Entrance Stairs-Number of Steps: 2   Home Layout: One level Home Equipment: Grab bars -  tub/shower      Prior Function Prior Level of Function : Independent/Modified Independent             Mobility Comments: no AD       Extremity/Trunk Assessment   Upper Extremity Assessment Upper Extremity Assessment: Overall WFL for tasks assessed    Lower Extremity Assessment Lower Extremity Assessment: LLE deficits/detail LLE Deficits / Details: KI in place LLE: Unable to fully  assess due to immobilization    Cervical / Trunk Assessment Cervical / Trunk Assessment: Normal  Communication   Communication Communication: No apparent difficulties    Cognition Arousal: Alert Behavior During Therapy: Anxious   PT - Cognitive impairments: No apparent impairments                       PT - Cognition Comments: mildly delayed responses - ? reent anaesthetic Following commands: Intact       Cueing Cueing Techniques: Verbal cues, Gestural cues     General Comments      Exercises Total Joint Exercises Ankle Circles/Pumps: AROM, Both, 15 reps, Supine   Assessment/Plan    PT Assessment Patient does not need any further PT services  PT Problem List         PT Treatment Interventions      PT Goals (Current goals can be found in the Care Plan section)  Acute Rehab PT Goals Patient Stated Goal: HOME PT Goal Formulation: All assessment and education complete, DC therapy    Frequency       Co-evaluation               AM-PAC PT 6 Clicks Mobility  Outcome Measure Help needed turning from your back to your side while in a flat bed without using bedrails?: A Little Help needed moving from lying on your back to sitting on the side of a flat bed without using bedrails?: None Help needed moving to and from a bed to a chair (including a wheelchair)?: A Little Help needed standing up from a chair using your arms (e.g., wheelchair or bedside chair)?: A Little Help needed to walk in hospital room?: A Little Help needed climbing 3-5 steps with a railing? : A Little 6 Click Score: 19    End of Session Equipment Utilized During Treatment: Gait belt;Left knee immobilizer Activity Tolerance: Patient tolerated treatment well Patient left: in chair;with call bell/phone within reach;with nursing/sitter in room Nurse Communication: Mobility status PT Visit Diagnosis: Difficulty in walking, not elsewhere classified (R26.2)    Time: 9169-9148 PT  Time Calculation (min) (ACUTE ONLY): 21 min   Charges:   PT Evaluation $PT Eval Low Complexity: 1 Low   PT General Charges $$ ACUTE PT VISIT: 1 Visit         [facility] PT Acute Rehabilitation Services Office [phone]   Viktorya Arguijo 10/04/2024, 9:02 AM

## 2024-10-06 ENCOUNTER — Encounter (HOSPITAL_COMMUNITY): Payer: Self-pay | Admitting: Orthopedic Surgery

## 2024-11-19 ENCOUNTER — Ambulatory Visit: Admitting: Podiatry

## 2024-12-24 ENCOUNTER — Encounter: Admitting: Pulmonary Disease

## 2025-02-24 ENCOUNTER — Ambulatory Visit: Admitting: Podiatry

## 2025-02-25 ENCOUNTER — Ambulatory Visit: Admitting: Podiatry

## 2025-04-13 ENCOUNTER — Ambulatory Visit (HOSPITAL_BASED_OUTPATIENT_CLINIC_OR_DEPARTMENT_OTHER): Admitting: Obstetrics & Gynecology

## 2025-06-17 ENCOUNTER — Ambulatory Visit: Admitting: Dermatology
